# Patient Record
Sex: Female | Born: 1950 | Race: White | Hispanic: No | Marital: Married | Smoking: Never smoker
Health system: Southern US, Community
[De-identification: ages and names within clinical notes are randomized; demographics above are authoritative.]

## PROBLEM LIST (undated history)

## (undated) DIAGNOSIS — G4700 Insomnia, unspecified: Secondary | ICD-10-CM

## (undated) DIAGNOSIS — Z9289 Personal history of other medical treatment: Secondary | ICD-10-CM

## (undated) DIAGNOSIS — G039 Meningitis, unspecified: Secondary | ICD-10-CM

## (undated) DIAGNOSIS — M810 Age-related osteoporosis without current pathological fracture: Secondary | ICD-10-CM

## (undated) DIAGNOSIS — E785 Hyperlipidemia, unspecified: Secondary | ICD-10-CM

## (undated) DIAGNOSIS — IMO0001 Reserved for inherently not codable concepts without codable children: Secondary | ICD-10-CM

## (undated) DIAGNOSIS — S4990XA Unspecified injury of shoulder and upper arm, unspecified arm, initial encounter: Secondary | ICD-10-CM

## (undated) DIAGNOSIS — G4730 Sleep apnea, unspecified: Secondary | ICD-10-CM

## (undated) DIAGNOSIS — F32A Depression, unspecified: Secondary | ICD-10-CM

## (undated) DIAGNOSIS — I2510 Atherosclerotic heart disease of native coronary artery without angina pectoris: Secondary | ICD-10-CM

## (undated) DIAGNOSIS — J479 Bronchiectasis, uncomplicated: Secondary | ICD-10-CM

## (undated) DIAGNOSIS — N200 Calculus of kidney: Secondary | ICD-10-CM

## (undated) DIAGNOSIS — H359 Unspecified retinal disorder: Secondary | ICD-10-CM

## (undated) DIAGNOSIS — F329 Major depressive disorder, single episode, unspecified: Secondary | ICD-10-CM

## (undated) DIAGNOSIS — H269 Unspecified cataract: Secondary | ICD-10-CM

## (undated) DIAGNOSIS — R053 Chronic cough: Secondary | ICD-10-CM

## (undated) DIAGNOSIS — M1990 Unspecified osteoarthritis, unspecified site: Secondary | ICD-10-CM

## (undated) DIAGNOSIS — IMO0002 Reserved for concepts with insufficient information to code with codable children: Secondary | ICD-10-CM

## (undated) DIAGNOSIS — R05 Cough: Secondary | ICD-10-CM

## (undated) DIAGNOSIS — I209 Angina pectoris, unspecified: Secondary | ICD-10-CM

## (undated) DIAGNOSIS — K635 Polyp of colon: Secondary | ICD-10-CM

## (undated) DIAGNOSIS — K449 Diaphragmatic hernia without obstruction or gangrene: Secondary | ICD-10-CM

## (undated) DIAGNOSIS — I10 Essential (primary) hypertension: Secondary | ICD-10-CM

## (undated) DIAGNOSIS — K219 Gastro-esophageal reflux disease without esophagitis: Secondary | ICD-10-CM

## (undated) DIAGNOSIS — N189 Chronic kidney disease, unspecified: Secondary | ICD-10-CM

## (undated) DIAGNOSIS — K589 Irritable bowel syndrome without diarrhea: Secondary | ICD-10-CM

## (undated) DIAGNOSIS — R1310 Dysphagia, unspecified: Secondary | ICD-10-CM

## (undated) DIAGNOSIS — K2970 Gastritis, unspecified, without bleeding: Secondary | ICD-10-CM

## (undated) DIAGNOSIS — T7840XA Allergy, unspecified, initial encounter: Secondary | ICD-10-CM

## (undated) DIAGNOSIS — F419 Anxiety disorder, unspecified: Secondary | ICD-10-CM

## (undated) DIAGNOSIS — I499 Cardiac arrhythmia, unspecified: Secondary | ICD-10-CM

## (undated) DIAGNOSIS — Q760 Spina bifida occulta: Secondary | ICD-10-CM

## (undated) DIAGNOSIS — C801 Malignant (primary) neoplasm, unspecified: Secondary | ICD-10-CM

## (undated) DIAGNOSIS — D649 Anemia, unspecified: Secondary | ICD-10-CM

## (undated) DIAGNOSIS — J189 Pneumonia, unspecified organism: Secondary | ICD-10-CM

## (undated) HISTORY — DX: Reserved for concepts with insufficient information to code with codable children: IMO0002

## (undated) HISTORY — DX: Insomnia, unspecified: G47.00

## (undated) HISTORY — DX: Gastro-esophageal reflux disease without esophagitis: K21.9

## (undated) HISTORY — DX: Anemia, unspecified: D64.9

## (undated) HISTORY — DX: Depression, unspecified: F32.A

## (undated) HISTORY — DX: Chronic kidney disease, unspecified: N18.9

## (undated) HISTORY — DX: Hyperlipidemia, unspecified: E78.5

## (undated) HISTORY — DX: Age-related osteoporosis without current pathological fracture: M81.0

## (undated) HISTORY — PX: ABDOMINAL HYSTERECTOMY: SHX81

## (undated) HISTORY — DX: Polyp of colon: K63.5

## (undated) HISTORY — DX: Gastritis, unspecified, without bleeding: K29.70

## (undated) HISTORY — DX: Allergy, unspecified, initial encounter: T78.40XA

## (undated) HISTORY — DX: Sleep apnea, unspecified: G47.30

## (undated) HISTORY — DX: Personal history of other medical treatment: Z92.89

## (undated) HISTORY — DX: Spina bifida occulta: Q76.0

## (undated) HISTORY — DX: Essential (primary) hypertension: I10

## (undated) HISTORY — DX: Unspecified cataract: H26.9

## (undated) HISTORY — PX: OTHER SURGICAL HISTORY: SHX169

## (undated) HISTORY — DX: Diaphragmatic hernia without obstruction or gangrene: K44.9

## (undated) HISTORY — DX: Irritable bowel syndrome, unspecified: K58.9

## (undated) HISTORY — DX: Atherosclerotic heart disease of native coronary artery without angina pectoris: I25.10

## (undated) HISTORY — PX: COLONOSCOPY: SHX174

## (undated) HISTORY — PX: KNEE ARTHROSCOPY: SHX127

## (undated) HISTORY — PX: BACK SURGERY: SHX140

## (undated) HISTORY — PX: TONSILLECTOMY: SUR1361

## (undated) HISTORY — DX: Unspecified injury of shoulder and upper arm, unspecified arm, initial encounter: S49.90XA

## (undated) HISTORY — PX: MENISCUS REPAIR: SHX5179

## (undated) HISTORY — PX: LITHOTRIPSY: SUR834

## (undated) HISTORY — PX: ELBOW SURGERY: SHX618

## (undated) HISTORY — PX: POLYPECTOMY: SHX149

## (undated) HISTORY — DX: Unspecified osteoarthritis, unspecified site: M19.90

## (undated) HISTORY — DX: Major depressive disorder, single episode, unspecified: F32.9

---

## 1998-02-13 ENCOUNTER — Ambulatory Visit (HOSPITAL_COMMUNITY): Admission: RE | Admit: 1998-02-13 | Discharge: 1998-02-13 | Payer: Self-pay | Admitting: Internal Medicine

## 1998-06-22 ENCOUNTER — Encounter: Payer: Self-pay | Admitting: Urology

## 1998-06-22 ENCOUNTER — Ambulatory Visit (HOSPITAL_COMMUNITY): Admission: RE | Admit: 1998-06-22 | Discharge: 1998-06-22 | Payer: Self-pay | Admitting: Urology

## 1998-06-23 ENCOUNTER — Emergency Department (HOSPITAL_COMMUNITY): Admission: EM | Admit: 1998-06-23 | Discharge: 1998-06-23 | Payer: Self-pay | Admitting: Emergency Medicine

## 1998-06-24 ENCOUNTER — Emergency Department (HOSPITAL_COMMUNITY): Admission: EM | Admit: 1998-06-24 | Discharge: 1998-06-24 | Payer: Self-pay | Admitting: Emergency Medicine

## 1999-08-31 ENCOUNTER — Encounter: Payer: Self-pay | Admitting: Internal Medicine

## 1999-08-31 ENCOUNTER — Ambulatory Visit (HOSPITAL_COMMUNITY): Admission: RE | Admit: 1999-08-31 | Discharge: 1999-08-31 | Payer: Self-pay | Admitting: Internal Medicine

## 1999-09-11 ENCOUNTER — Ambulatory Visit (HOSPITAL_COMMUNITY): Admission: RE | Admit: 1999-09-11 | Discharge: 1999-09-11 | Payer: Self-pay | Admitting: Internal Medicine

## 1999-09-11 ENCOUNTER — Encounter: Payer: Self-pay | Admitting: Internal Medicine

## 1999-10-08 ENCOUNTER — Ambulatory Visit (HOSPITAL_COMMUNITY): Admission: RE | Admit: 1999-10-08 | Discharge: 1999-10-08 | Payer: Self-pay | Admitting: Internal Medicine

## 1999-11-06 ENCOUNTER — Ambulatory Visit (HOSPITAL_COMMUNITY): Admission: RE | Admit: 1999-11-06 | Discharge: 1999-11-06 | Payer: Self-pay | Admitting: Internal Medicine

## 1999-11-26 ENCOUNTER — Observation Stay (HOSPITAL_COMMUNITY): Admission: RE | Admit: 1999-11-26 | Discharge: 1999-11-28 | Payer: Self-pay | Admitting: *Deleted

## 2000-04-11 ENCOUNTER — Encounter: Admission: RE | Admit: 2000-04-11 | Discharge: 2000-04-11 | Payer: Self-pay | Admitting: Internal Medicine

## 2000-07-30 ENCOUNTER — Other Ambulatory Visit: Admission: RE | Admit: 2000-07-30 | Discharge: 2000-07-30 | Payer: Self-pay | Admitting: Internal Medicine

## 2000-09-12 ENCOUNTER — Encounter: Payer: Self-pay | Admitting: Internal Medicine

## 2000-09-12 ENCOUNTER — Ambulatory Visit (HOSPITAL_COMMUNITY): Admission: RE | Admit: 2000-09-12 | Discharge: 2000-09-12 | Payer: Self-pay | Admitting: Internal Medicine

## 2001-03-27 ENCOUNTER — Emergency Department (HOSPITAL_COMMUNITY): Admission: EM | Admit: 2001-03-27 | Discharge: 2001-03-27 | Payer: Self-pay | Admitting: Emergency Medicine

## 2001-03-27 ENCOUNTER — Encounter: Payer: Self-pay | Admitting: Emergency Medicine

## 2001-04-02 ENCOUNTER — Encounter: Payer: Self-pay | Admitting: Emergency Medicine

## 2001-04-02 ENCOUNTER — Emergency Department (HOSPITAL_COMMUNITY): Admission: EM | Admit: 2001-04-02 | Discharge: 2001-04-02 | Payer: Self-pay | Admitting: Emergency Medicine

## 2001-10-27 ENCOUNTER — Encounter: Payer: Self-pay | Admitting: Internal Medicine

## 2001-10-27 ENCOUNTER — Ambulatory Visit (HOSPITAL_COMMUNITY): Admission: RE | Admit: 2001-10-27 | Discharge: 2001-10-27 | Payer: Self-pay | Admitting: Internal Medicine

## 2001-11-05 ENCOUNTER — Encounter: Admission: RE | Admit: 2001-11-05 | Discharge: 2001-11-05 | Payer: Self-pay | Admitting: Internal Medicine

## 2001-11-05 ENCOUNTER — Encounter: Payer: Self-pay | Admitting: Internal Medicine

## 2001-11-06 ENCOUNTER — Encounter: Payer: Self-pay | Admitting: Emergency Medicine

## 2001-11-06 ENCOUNTER — Emergency Department (HOSPITAL_COMMUNITY): Admission: EM | Admit: 2001-11-06 | Discharge: 2001-11-07 | Payer: Self-pay | Admitting: Emergency Medicine

## 2001-11-10 ENCOUNTER — Encounter: Payer: Self-pay | Admitting: Gastroenterology

## 2001-11-10 ENCOUNTER — Ambulatory Visit (HOSPITAL_COMMUNITY): Admission: RE | Admit: 2001-11-10 | Discharge: 2001-11-10 | Payer: Self-pay | Admitting: Gastroenterology

## 2001-11-17 ENCOUNTER — Encounter: Payer: Self-pay | Admitting: Gastroenterology

## 2001-11-17 ENCOUNTER — Ambulatory Visit (HOSPITAL_COMMUNITY): Admission: RE | Admit: 2001-11-17 | Discharge: 2001-11-17 | Payer: Self-pay | Admitting: Gastroenterology

## 2001-11-27 ENCOUNTER — Ambulatory Visit (HOSPITAL_COMMUNITY): Admission: RE | Admit: 2001-11-27 | Discharge: 2001-11-27 | Payer: Self-pay | Admitting: Internal Medicine

## 2001-11-27 ENCOUNTER — Encounter: Payer: Self-pay | Admitting: Internal Medicine

## 2002-10-29 ENCOUNTER — Encounter: Payer: Self-pay | Admitting: Internal Medicine

## 2002-10-29 ENCOUNTER — Ambulatory Visit (HOSPITAL_COMMUNITY): Admission: RE | Admit: 2002-10-29 | Discharge: 2002-10-29 | Payer: Self-pay | Admitting: Internal Medicine

## 2003-11-07 ENCOUNTER — Ambulatory Visit (HOSPITAL_COMMUNITY): Admission: RE | Admit: 2003-11-07 | Discharge: 2003-11-07 | Payer: Self-pay | Admitting: Internal Medicine

## 2004-07-22 ENCOUNTER — Emergency Department (HOSPITAL_COMMUNITY): Admission: EM | Admit: 2004-07-22 | Discharge: 2004-07-23 | Payer: Self-pay | Admitting: Emergency Medicine

## 2004-08-31 ENCOUNTER — Ambulatory Visit: Payer: Self-pay | Admitting: Internal Medicine

## 2004-09-11 ENCOUNTER — Ambulatory Visit: Payer: Self-pay | Admitting: Internal Medicine

## 2004-10-08 ENCOUNTER — Ambulatory Visit (HOSPITAL_COMMUNITY): Admission: RE | Admit: 2004-10-08 | Discharge: 2004-10-08 | Payer: Self-pay | Admitting: *Deleted

## 2004-10-23 ENCOUNTER — Ambulatory Visit (HOSPITAL_COMMUNITY): Admission: RE | Admit: 2004-10-23 | Discharge: 2004-10-23 | Payer: Self-pay | Admitting: Internal Medicine

## 2004-11-13 ENCOUNTER — Ambulatory Visit (HOSPITAL_COMMUNITY): Admission: RE | Admit: 2004-11-13 | Discharge: 2004-11-13 | Payer: Self-pay | Admitting: Internal Medicine

## 2004-11-19 ENCOUNTER — Ambulatory Visit: Payer: Self-pay | Admitting: Internal Medicine

## 2004-11-22 ENCOUNTER — Encounter: Admission: RE | Admit: 2004-11-22 | Discharge: 2004-11-22 | Payer: Self-pay | Admitting: *Deleted

## 2005-11-14 ENCOUNTER — Encounter: Admission: RE | Admit: 2005-11-14 | Discharge: 2005-11-14 | Payer: Self-pay | Admitting: Internal Medicine

## 2006-02-03 ENCOUNTER — Encounter: Admission: RE | Admit: 2006-02-03 | Discharge: 2006-02-03 | Payer: Self-pay | Admitting: Internal Medicine

## 2006-12-01 ENCOUNTER — Encounter: Admission: RE | Admit: 2006-12-01 | Discharge: 2006-12-01 | Payer: Self-pay | Admitting: Internal Medicine

## 2006-12-10 ENCOUNTER — Encounter: Admission: RE | Admit: 2006-12-10 | Discharge: 2006-12-10 | Payer: Self-pay | Admitting: Internal Medicine

## 2007-09-19 ENCOUNTER — Encounter: Admission: RE | Admit: 2007-09-19 | Discharge: 2007-09-19 | Payer: Self-pay | Admitting: Neurological Surgery

## 2007-09-22 ENCOUNTER — Ambulatory Visit: Payer: Self-pay | Admitting: Internal Medicine

## 2007-10-27 ENCOUNTER — Encounter: Payer: Self-pay | Admitting: Internal Medicine

## 2007-10-27 ENCOUNTER — Ambulatory Visit: Payer: Self-pay | Admitting: Internal Medicine

## 2007-12-29 ENCOUNTER — Encounter: Admission: RE | Admit: 2007-12-29 | Discharge: 2007-12-29 | Payer: Self-pay | Admitting: Internal Medicine

## 2008-02-03 ENCOUNTER — Ambulatory Visit (HOSPITAL_COMMUNITY): Admission: RE | Admit: 2008-02-03 | Discharge: 2008-02-03 | Payer: Self-pay | Admitting: Allergy and Immunology

## 2008-02-05 ENCOUNTER — Telehealth: Payer: Self-pay | Admitting: Internal Medicine

## 2008-02-05 DIAGNOSIS — R1314 Dysphagia, pharyngoesophageal phase: Secondary | ICD-10-CM | POA: Insufficient documentation

## 2008-02-09 ENCOUNTER — Ambulatory Visit (HOSPITAL_COMMUNITY): Admission: RE | Admit: 2008-02-09 | Discharge: 2008-02-09 | Payer: Self-pay | Admitting: Internal Medicine

## 2008-02-18 ENCOUNTER — Telehealth (INDEPENDENT_AMBULATORY_CARE_PROVIDER_SITE_OTHER): Payer: Self-pay | Admitting: *Deleted

## 2008-02-18 ENCOUNTER — Telehealth: Payer: Self-pay | Admitting: Internal Medicine

## 2008-02-25 ENCOUNTER — Encounter: Payer: Self-pay | Admitting: Internal Medicine

## 2008-03-04 DIAGNOSIS — K219 Gastro-esophageal reflux disease without esophagitis: Secondary | ICD-10-CM | POA: Insufficient documentation

## 2008-03-04 DIAGNOSIS — K222 Esophageal obstruction: Secondary | ICD-10-CM | POA: Insufficient documentation

## 2008-03-04 DIAGNOSIS — K589 Irritable bowel syndrome without diarrhea: Secondary | ICD-10-CM | POA: Insufficient documentation

## 2008-03-04 DIAGNOSIS — D126 Benign neoplasm of colon, unspecified: Secondary | ICD-10-CM | POA: Insufficient documentation

## 2008-03-07 ENCOUNTER — Ambulatory Visit: Payer: Self-pay | Admitting: Internal Medicine

## 2008-03-07 DIAGNOSIS — Z860100 Personal history of colon polyps, unspecified: Secondary | ICD-10-CM | POA: Insufficient documentation

## 2008-03-07 DIAGNOSIS — R1319 Other dysphagia: Secondary | ICD-10-CM | POA: Insufficient documentation

## 2008-03-08 ENCOUNTER — Encounter: Payer: Self-pay | Admitting: Internal Medicine

## 2008-03-25 ENCOUNTER — Ambulatory Visit: Payer: Self-pay | Admitting: Internal Medicine

## 2008-03-25 DIAGNOSIS — R1013 Epigastric pain: Secondary | ICD-10-CM | POA: Insufficient documentation

## 2008-03-25 DIAGNOSIS — R079 Chest pain, unspecified: Secondary | ICD-10-CM | POA: Insufficient documentation

## 2008-03-28 ENCOUNTER — Telehealth (INDEPENDENT_AMBULATORY_CARE_PROVIDER_SITE_OTHER): Payer: Self-pay | Admitting: *Deleted

## 2008-03-30 ENCOUNTER — Encounter: Payer: Self-pay | Admitting: Internal Medicine

## 2008-03-30 ENCOUNTER — Telehealth (INDEPENDENT_AMBULATORY_CARE_PROVIDER_SITE_OTHER): Payer: Self-pay | Admitting: *Deleted

## 2008-03-31 ENCOUNTER — Ambulatory Visit (HOSPITAL_COMMUNITY): Admission: RE | Admit: 2008-03-31 | Discharge: 2008-03-31 | Payer: Self-pay | Admitting: Internal Medicine

## 2008-03-31 ENCOUNTER — Telehealth: Payer: Self-pay | Admitting: Internal Medicine

## 2008-04-04 ENCOUNTER — Telehealth: Payer: Self-pay | Admitting: Internal Medicine

## 2008-04-05 ENCOUNTER — Telehealth (INDEPENDENT_AMBULATORY_CARE_PROVIDER_SITE_OTHER): Payer: Self-pay | Admitting: *Deleted

## 2008-04-07 ENCOUNTER — Telehealth (INDEPENDENT_AMBULATORY_CARE_PROVIDER_SITE_OTHER): Payer: Self-pay | Admitting: *Deleted

## 2008-04-07 ENCOUNTER — Ambulatory Visit (HOSPITAL_COMMUNITY): Admission: RE | Admit: 2008-04-07 | Discharge: 2008-04-07 | Payer: Self-pay | Admitting: Internal Medicine

## 2008-04-07 ENCOUNTER — Ambulatory Visit: Payer: Self-pay | Admitting: Internal Medicine

## 2008-04-11 ENCOUNTER — Telehealth: Payer: Self-pay | Admitting: Internal Medicine

## 2008-04-25 ENCOUNTER — Ambulatory Visit (HOSPITAL_COMMUNITY): Admission: RE | Admit: 2008-04-25 | Discharge: 2008-04-25 | Payer: Self-pay | Admitting: Internal Medicine

## 2008-04-26 ENCOUNTER — Ambulatory Visit: Payer: Self-pay | Admitting: Internal Medicine

## 2008-04-27 ENCOUNTER — Telehealth: Payer: Self-pay | Admitting: Internal Medicine

## 2008-09-21 ENCOUNTER — Encounter: Admission: RE | Admit: 2008-09-21 | Discharge: 2008-11-18 | Payer: Self-pay | Admitting: Orthopaedic Surgery

## 2009-01-12 ENCOUNTER — Encounter: Admission: RE | Admit: 2009-01-12 | Discharge: 2009-01-12 | Payer: Self-pay | Admitting: Internal Medicine

## 2009-05-10 ENCOUNTER — Ambulatory Visit (HOSPITAL_COMMUNITY): Admission: RE | Admit: 2009-05-10 | Discharge: 2009-05-10 | Payer: Self-pay | Admitting: Internal Medicine

## 2009-07-11 ENCOUNTER — Inpatient Hospital Stay (HOSPITAL_COMMUNITY): Admission: AD | Admit: 2009-07-11 | Discharge: 2009-07-12 | Payer: Self-pay | Admitting: Orthopaedic Surgery

## 2010-02-06 ENCOUNTER — Encounter: Admission: RE | Admit: 2010-02-06 | Discharge: 2010-02-06 | Payer: Self-pay | Admitting: Internal Medicine

## 2010-09-16 ENCOUNTER — Encounter: Payer: Self-pay | Admitting: Allergy and Immunology

## 2010-09-16 ENCOUNTER — Encounter: Payer: Self-pay | Admitting: Internal Medicine

## 2010-09-25 NOTE — Progress Notes (Signed)
Summary: LIBRAX TOO EXPENSIVE  Medications Added BENTYL 20 MG  TABS (DICYCLOMINE HCL) take 1 p.o. a.c. as needed       Phone Note Call from Patient Call back at Home Phone [phone]   Call For: DR Nashonda Limberg Summary of Call:  WAS HERE YESTERDAY GIVEN LIBRAX WAS $156 AND SHE JUST CAN NOT AFFORD. Initial call taken by: Leanor Kail [facility],  April 27, 2008 12:05 PM  Follow-up for Phone Call        Even generic??? If so, try bentyl 20mg  ac as needed.Hilarie Fredrickson MD  April 27, 2008 1:06 PM Pt contacted her pharmacy and price of 156.00 was for one month supply of librax.Pharmacistsays bentyl will be cheaper,order sent to pharmacy.  Teryl Lucy RN  April 27, 2008 3:23 PM      New/Updated Medications: BENTYL 20 MG  TABS (DICYCLOMINE HCL) take 1 p.o. a.c. as needed   Prescriptions: BENTYL 20 MG  TABS (DICYCLOMINE HCL) take 1 p.o. a.c. as needed  #90 x 6   Entered by:   Teryl Lucy RN   Authorized by:   Hilarie Fredrickson MD   Signed by:   Teryl Lucy RN on 04/27/2008   Method used:   Electronically to        [facility]. [phone] (retail)       [address]       [place], [place]  [address]       Ph: [phone]       Fax: [phone]   RxID:   [id number]

## 2010-10-19 ENCOUNTER — Ambulatory Visit (HOSPITAL_COMMUNITY): Payer: Medicare Other | Attending: Internal Medicine

## 2010-10-19 DIAGNOSIS — M810 Age-related osteoporosis without current pathological fracture: Secondary | ICD-10-CM | POA: Insufficient documentation

## 2010-10-22 ENCOUNTER — Observation Stay (HOSPITAL_COMMUNITY): Payer: Medicare Other

## 2010-10-22 ENCOUNTER — Emergency Department (HOSPITAL_COMMUNITY): Payer: Medicare Other

## 2010-10-22 ENCOUNTER — Observation Stay (HOSPITAL_COMMUNITY)
Admission: EM | Admit: 2010-10-22 | Discharge: 2010-10-23 | Disposition: A | Discharge status: Home / Self Care | Payer: Medicare Other | Attending: Cardiovascular Disease | Admitting: Cardiovascular Disease

## 2010-10-22 DIAGNOSIS — J329 Chronic sinusitis, unspecified: Secondary | ICD-10-CM | POA: Insufficient documentation

## 2010-10-22 DIAGNOSIS — E119 Type 2 diabetes mellitus without complications: Secondary | ICD-10-CM | POA: Insufficient documentation

## 2010-10-22 DIAGNOSIS — Z860100 Personal history of colon polyps, unspecified: Secondary | ICD-10-CM | POA: Insufficient documentation

## 2010-10-22 DIAGNOSIS — I10 Essential (primary) hypertension: Secondary | ICD-10-CM | POA: Insufficient documentation

## 2010-10-22 DIAGNOSIS — R079 Chest pain, unspecified: Principal | ICD-10-CM | POA: Insufficient documentation

## 2010-10-22 DIAGNOSIS — K589 Irritable bowel syndrome without diarrhea: Secondary | ICD-10-CM | POA: Insufficient documentation

## 2010-10-22 DIAGNOSIS — R61 Generalized hyperhidrosis: Secondary | ICD-10-CM | POA: Insufficient documentation

## 2010-10-22 DIAGNOSIS — Z7902 Long term (current) use of antithrombotics/antiplatelets: Secondary | ICD-10-CM | POA: Insufficient documentation

## 2010-10-22 DIAGNOSIS — E785 Hyperlipidemia, unspecified: Secondary | ICD-10-CM | POA: Insufficient documentation

## 2010-10-22 DIAGNOSIS — R110 Nausea: Secondary | ICD-10-CM | POA: Insufficient documentation

## 2010-10-22 DIAGNOSIS — H579 Unspecified disorder of eye and adnexa: Secondary | ICD-10-CM | POA: Insufficient documentation

## 2010-10-22 DIAGNOSIS — M810 Age-related osteoporosis without current pathological fracture: Secondary | ICD-10-CM | POA: Insufficient documentation

## 2010-10-22 DIAGNOSIS — R0602 Shortness of breath: Secondary | ICD-10-CM | POA: Insufficient documentation

## 2010-10-22 DIAGNOSIS — Z79899 Other long term (current) drug therapy: Secondary | ICD-10-CM | POA: Insufficient documentation

## 2010-10-22 LAB — BASIC METABOLIC PANEL
GFR calc non Af Amer: 46 mL/min — ABNORMAL LOW (ref >60)
Glucose, Bld: 235 mg/dL — ABNORMAL HIGH (ref 70–99)
Potassium: 3.6 mEq/L (ref 3.5–5.1)
Sodium: 136 mEq/L (ref 135–145)

## 2010-10-22 LAB — CBC
HCT: 35.6 % — ABNORMAL LOW (ref 36.0–46.0)
Hemoglobin: 12.1 g/dL (ref 12.0–15.0)
MCHC: 34 g/dL (ref 30.0–36.0)
MCV: 90.8 fL (ref 78.0–100.0)
Platelets: 152 10*3/uL (ref 150–400)
RBC: 3.92 MIL/uL (ref 3.87–5.11)
WBC: 10.8 10*3/uL — ABNORMAL HIGH (ref 4.0–10.5)

## 2010-10-22 LAB — DIFFERENTIAL
Eosinophils Relative: 1 % (ref 0–5)
Monocytes Absolute: 0.8 10*3/uL (ref 0.1–1.0)
Monocytes Relative: 7 % (ref 3–12)

## 2010-10-22 LAB — COMPREHENSIVE METABOLIC PANEL
BUN: 14 mg/dL (ref 6–23)
CO2: 24 mEq/L (ref 19–32)
Chloride: 104 mEq/L (ref 96–112)
Creatinine, Ser: 1.09 mg/dL (ref 0.4–1.2)
GFR calc non Af Amer: 52 mL/min — ABNORMAL LOW (ref >60)
Glucose, Bld: 196 mg/dL — ABNORMAL HIGH (ref 70–99)
Total Bilirubin: 0.6 mg/dL (ref 0.3–1.2)

## 2010-10-22 LAB — MAGNESIUM: Magnesium: 2.5 mg/dL (ref 1.5–2.5)

## 2010-10-22 LAB — URINALYSIS, ROUTINE W REFLEX MICROSCOPIC
Protein, ur: 30 mg/dL — AB
Specific Gravity, Urine: 1.012 (ref 1.005–1.030)
Urobilinogen, UA: 0.2 mg/dL (ref 0.0–1.0)
pH: 5.5 (ref 5.0–8.0)

## 2010-10-22 LAB — GLUCOSE, CAPILLARY: Glucose-Capillary: 134 mg/dL — ABNORMAL HIGH (ref 70–99)

## 2010-10-22 LAB — TROPONIN I: Troponin I: <0.01 ng/mL (ref 0.00–0.06)

## 2010-10-22 LAB — CK TOTAL AND CKMB (NOT AT ARMC)
Relative Index: 1.3 (ref 0.0–2.5)
Total CK: 130 U/L (ref 7–177)

## 2010-10-22 LAB — POCT CARDIAC MARKERS

## 2010-10-22 LAB — TSH: TSH: 2.945 u[IU]/mL (ref 0.350–4.500)

## 2010-10-22 LAB — URINE MICROSCOPIC-ADD ON

## 2010-10-22 LAB — APTT: aPTT: 28 seconds (ref 24–37)

## 2010-10-23 ENCOUNTER — Observation Stay (HOSPITAL_COMMUNITY): Payer: Medicare Other

## 2010-10-23 LAB — CBC
HCT: 35.7 % — ABNORMAL LOW (ref 36.0–46.0)
MCV: 92.5 fL (ref 78.0–100.0)
RBC: 3.86 MIL/uL — ABNORMAL LOW (ref 3.87–5.11)
WBC: 7.4 10*3/uL (ref 4.0–10.5)

## 2010-10-23 LAB — GLUCOSE, CAPILLARY: Glucose-Capillary: 303 mg/dL — ABNORMAL HIGH (ref 70–99)

## 2010-10-23 LAB — CARDIAC PANEL(CRET KIN+CKTOT+MB+TROPI)
CK, MB: 1.2 ng/mL (ref 0.3–4.0)
Relative Index: INVALID (ref 0.0–2.5)
Total CK: 58 U/L (ref 7–177)

## 2010-10-23 LAB — LIPID PANEL
Cholesterol: 226 mg/dL — ABNORMAL HIGH (ref 0–200)
HDL: 44 mg/dL (ref >39)
Triglycerides: 381 mg/dL — ABNORMAL HIGH (ref <150)

## 2010-10-23 LAB — HEPARIN LEVEL (UNFRACTIONATED): Heparin Unfractionated: <0.1 IU/mL — ABNORMAL LOW (ref 0.30–0.70)

## 2010-10-23 MED ORDER — TECHNETIUM TC 99M TETROFOSMIN IV KIT
10.0000 | PACK | Freq: Once | INTRAVENOUS | Status: AC | PRN
  Administered 2010-10-23: 10 via INTRAVENOUS

## 2010-10-23 MED ORDER — TECHNETIUM TC 99M TETROFOSMIN IV KIT
30.0000 | PACK | Freq: Once | INTRAVENOUS | Status: AC | PRN
  Administered 2010-10-23: 30 via INTRAVENOUS

## 2010-10-24 ENCOUNTER — Observation Stay (HOSPITAL_COMMUNITY): Payer: Medicare Other

## 2010-10-26 NOTE — H&P (Signed)
NAME:  Tiffany [name], CUMBER NO.:  [id number]  MEDICAL RECORD NO.:  [id number]           PATIENT TYPE:  E  LOCATION:  [facility]                         FACILITY:  [facility]  PHYSICIAN:  Thurmon Fair, MD     DATE OF BIRTH:  09/16/1950  DATE OF ADMISSION:  10/22/2010 DATE OF DISCHARGE:                             HISTORY & PHYSICAL   CHIEF COMPLAINT:  Chest pain.  HISTORY OF PRESENT ILLNESS:  Ms. Tiffany [name] is a 60 year old female followed by Dr. Felipa Eth.  She has multiple medical problems.  She had a remote Cardiolite by Dr. Patty Sermons, which she says was normal.  She has never had a catheterization.  She has not previously been troubled by chest pain as far as she knows.  Last week, she received a injection of Reclast for osteoporosis.  This weekend, she developed a reaction in her right eye, which is fairly severe.  Her right eye is closed and inflamed.  She went to see Dr. Burgess Estelle her ophthalmologist.  She also says Saturday that she developed some chest pain.  She says it started in her left shoulder then went to her mid chest.  She said it was so bad and at one point, she thought she was going to pass out.  She did have some breathlessness and some diaphoresis.  She has been nauseated off and on, on the weekend and contributed this to the injection reaction. Her symptoms abated on Saturday, but it came back today at low level. She describes a band-like discomfort around her chest.  It seems to be worse with activity.  She mentioned this to the ophthalmologist and he suggested she come to the emergency room.  Her EKG shows no acute changes and her initial enzymes were negative.  She is admitted now for further evaluation.  PAST MEDICAL HISTORY:  Remarkable for, 1. Type 2 non-insulin-dependent diabetes, diet-controlled. 2. She has had irritable bowel syndrome and gastritis and esophagitis.     She has been seen by [facility] in the past. 3. She has had previous Nissen  fundoplication as well as esophageal     dilatation. 4. She has a history of spina bifida and had back surgery in January     2006. 5. She has a history of adenomatous polyps and has had a polypectomy     in the past. 6. She had cellulitis in her left arm, November 2010, and was admitted     and had I and D of this. 7. She has osteoporosis as noted. 8. She has treated dyslipidemia, she takes Zocor 80 at home. 9. She has chronic sinusitis.  HOME MEDICATIONS: 1. Claritin. 2. Flonase. 3. Flexeril. 4. Zocor 80 mg.  ALLERGIES:  SHE HAS DRUG INTOLERANCES INCLUDING ACE INHIBITORS AND ARB, WHICH HAVE CAUSED ANGIOEDEMA IN THE PAST.  SHE IS INTOLERANT TO NONSTEROIDALS AND ASPIRIN, WHICH CAUSED SOME GI IRRITATION IN THE PAST. SHE IS INTOLERANT TO ERYTHROMYCIN, WHICH ALSO CAUSES GI PROBLEMS AS WELL AS CODEINE.  SOCIAL HISTORY:  She is married.  She is a nonsmoker.  FAMILY HISTORY:  Unremarkable for coronary disease, her brother had a  catheterization, which was normal.  REVIEW OF SYSTEMS:  Essentially unremarkable except for noted above.  PHYSICAL EXAM:  VITAL SIGNS:  Blood pressure 129/86, pulse 92, temperature 98.7. GENERAL:  She is a well-developed overweight female, in no acute distress. HEENT:  Her right eye is injected and closed, she says she cannot open her right eye on her own.  There is periorbital edema. NECK:  Without JVD or bruit. CHEST:  Clear to auscultation and percussion. CARDIAC:  Regular rate and rhythm without obvious murmur, rub, or gallop.  Normal S1 and S2. ABDOMEN:  Obese, nontender.  Bowel sounds are present. EXTREMITIES:  Without edema.  Distal pulses are 2+/4 bilaterally. NEURO EXAM:  Grossly intact.  She is awake, alert, oriented, and cooperative.  Moves all extremities without obvious deficit. SKIN:  Cool and dry.  LABORATORY DATA:  Troponins were negative x2.  Sodium 136, potassium 3.6, BUN 15, creatinine 1.21.  White count 10.8, hemoglobin  12.1, hematocrit 35.6, platelets 152.  Chest x-ray shows scarring at the bases, but no acute disease.  EKG shows sinus rhythm with nonspecific ST changes.  IMPRESSION: 1. Unstable angina. 2. Type 2 non-insulin-dependent diabetes. 3. History of hypertension, previously treated with ACE inhibitor and     ARB, but she developed angioedema, currently not on any     antihypertensives. 4. Treated dyslipidemia, on Zocor 80 mg a day. 5. Recent Reclast injection with reaction in her right eye. 6. Irritable bowel syndrome. 7. History of gastroesophageal reflux disease and esophagitis,     followed by [facility]. 8. Spina bifida status post surgery, January 2006. 9. History of adenomatous polyps.  PLAN:  The patient will be admitted to telemetry, started on heparin and nitrates and beta-blocker, and ruled out for an MI.  I did put her on the schedule for a Lexiscan Myoview tomorrow if her enzymes were negative.  The patient feels her symptoms are secondary to stress.     Abelino Derrick, P.A.   ______________________________ Thurmon Fair, MD    LKK/MEDQ  D:  10/22/2010  T:  10/22/2010  Job:  [id number]  cc:   Thurmon Fair, MD Larina Earthly, M.D.  Electronically Signed by Corine Shelter P.A. on 10/22/2010 06:03:04 PM Electronically Signed by Thurmon Fair M.D. on 10/26/2010 12:51:25 PM

## 2010-11-02 NOTE — Discharge Summary (Signed)
Tiffany [name], SLIVA NO.:  [id number]  MEDICAL RECORD NO.:  [id number]           PATIENT TYPE:  I  LOCATION:  3743                         FACILITY:  [facility]  PHYSICIAN:  [place]aly Hilty, MD         DATE OF BIRTH:  July 26, 1951  DATE OF ADMISSION:  Nov 19, 2010 DATE OF DISCHARGE:  10/23/2010                              DISCHARGE SUMMARY   DISCHARGE DIAGNOSES: 1. Chest pain: negative cardiac enzymes x3.  Negative Lexiscan     Myoview. 2. Suspected drug reaction from Reclast injection to right eye.  The     patient is being treated by [facility] and also Dr.     Jarome Matin. 3. Type 2 diabetes. 4. History of hypertension. 5. Treated dyslipidemia.  HOSPITAL COURSE:  Ms. [name] is a 59 year old Caucasian female followed by Dr. Felipa Eth.  She has multiple medical problems.  She has not been previously troubled by any chest pain that she knows of.  In the last week, she received injection of Reclast for osteoporosis.  Subsequently, developed a reaction in her right eye which is fairly severe.  Her right eye was closed and inflamed.  She went to see Dr. Burgess Estelle, her ophthalmologist.  She also stated that on Saturday, she developed some chest pain which started in her left shoulder and went towards mid chest.  It was so bad at one point she thought she was going to pass out.  She did have some breathlessness as well as diaphoresis and as well as nausea off and on throughout the weekend.  An EKG showed no acute changes and initial enzymes were negative.  She was admitted for further evaluation to cycle cardiac enzymes.  She was started on heparin and nitrates and beta-blockers to rule out for MI.  She was scheduled for [facility] for the 28.  Myoview was negative for ischemia. Cardiac enzymes were negative x3.  She is currently chest pain free. She has seen by Dr. Rennis Golden, who feels she is stable for discharge.  DISCHARGE LABORATORY DATA:  WBC 7.4,  hemoglobin 11.8, hematocrit 35.7, platelets 183, blood glucose 303.  Sodium 137, potassium 3.6, chloride 104, carbon dioxide 24, BUN 14, creatinine 1.09, total bilirubin 0.6, alk phos 186, AST 89, ALT 79, total protein 7.1, albumin 3.9, calcium 8.9, magnesium 2.5, hemoglobin A1c 8.2, total cholesterol 226, triglycerides 281, HDL 44, LDL 106.  Urinalysis showed large leukocytes and few bacteria, small blood, protein of 30 and few squamous epithelials.  STUDIES/PROCEDURES:  Chest x-ray 2010-11-19.  Impression, subsegmental atelectasis or scarring in both perihilar regions.  No acute findings.  Lexiscan Myoview stress test.  Impression, normal myocardial perfusion examination.  No evidence of ischemia or reversible defect.  DISCHARGE MEDICATIONS: 1. Amaryl  2 mg 1 tablet by mouth every morning. 2. Homatropine 5% ophthalmiac solution 1 drop in the right eye twice     daily. 3. Metoprolol tartrate 25 mg tablets one half tablet by mouth twice     daily. 4. Nitroglycerin sublingual 0.4 mg 1 tablet every 5 minutes as needed  for chest pain 3 doses total.  5. Prednisolone 1% ophthalmiac solution 1 drop in the right eye every     2 hours. 6. Prednisone 20 mg tablets 2 tablets by mouth daily with a meal. 7. Calcium carbonate over-the-counter 1-2 tablets by mouth twice     daily. 8. Furosemide 40 mg 1 tablet by mouth daily as needed for swelling. 9. Flexeril 10 mg 1 tablet by mouth daily at bedtime. 10.Flonase nasal spray 1 spray nasally daily as needed for nasal     congestion. 11.Hydrocodone/APAP 5/500 mg one tablet by mouth every 6 h. as needed     for back pain. 12.Phenergan 25 mg 1-2 tablets by mouth every 4 h. as needed for     nausea. 13.Tylenol 325 mg 1 tablet by mouth every 6 h. as needed for fever     pain. 14.Valium 10 mg 1 tablet by mouth daily at bedtime. 15.Vitamin D2 50,000 units 1 tablet by mouth weekly every Monday. 16.Zocor 80 mg 1 tablet by mouth  daily.  DISPOSITION:  Ms. Tiffany [name] will be discharged home in stable condition. It is recommended she eat a heart-healthy diet. She will follow up with Dr. Royann Shivers at [facility]. We will call her with the appointment time or send her card.  She also will follow up with Dr. Eloise Harman on Thursday.    ______________________________ Wilburt Finlay, PA   ______________________________ [place] Hilty, MD    BH/MEDQ  D:  10/23/2010  T:  10/24/2010  Job:  [id number]  cc:   Thurmon Fair, MD Lamarr Lulas, MD  Electronically Signed by Wilburt Finlay PA on 10/24/2010 04:30:43 PM Electronically Signed by Kirtland Bouchard. HILTY M.D. on 11/01/2010 08:01:27 AM

## 2010-11-28 LAB — CBC
HCT: 34.9 % — ABNORMAL LOW (ref 36.0–46.0)
Hemoglobin: 12.1 g/dL (ref 12.0–15.0)
MCHC: 34.5 g/dL (ref 30.0–36.0)
MCV: 94.2 fL (ref 78.0–100.0)
Platelets: 205 10*3/uL (ref 150–400)
RDW: 13.1 % (ref 11.5–15.5)

## 2010-11-28 LAB — BASIC METABOLIC PANEL
BUN: 8 mg/dL (ref 6–23)
CO2: 29 mEq/L (ref 19–32)
GFR calc non Af Amer: >60 mL/min (ref >60)
Glucose, Bld: 109 mg/dL — ABNORMAL HIGH (ref 70–99)
Potassium: 4 mEq/L (ref 3.5–5.1)

## 2010-11-28 LAB — DIFFERENTIAL
Basophils Absolute: 0 10*3/uL (ref 0.0–0.1)
Basophils Relative: 0 % (ref 0–1)
Eosinophils Absolute: 0.1 10*3/uL (ref 0.0–0.7)
Eosinophils Relative: 2 % (ref 0–5)
Lymphocytes Relative: 26 % (ref 12–46)
Monocytes Absolute: 0.6 10*3/uL (ref 0.1–1.0)

## 2010-11-28 LAB — PROTIME-INR: INR: 0.99 (ref 0.00–1.49)

## 2010-11-28 LAB — ANAEROBIC CULTURE

## 2010-11-28 LAB — CULTURE, ROUTINE-ABSCESS

## 2011-01-08 NOTE — Assessment & Plan Note (Signed)
HEALTHCARE                         GASTROENTEROLOGY OFFICE NOTE   Tiffany [name]                      MRN:          [id number]  DATE:09/22/2007                            DOB:          1950/12/13    HISTORY:  Tiffany [name] presents today with several different GI issues.  She has  not been seen in the office since October of 2005.  Briefly, she is a 60-  year-old female with a history of gastroesophageal reflux disease  refractory to medical therapy with subsequent Nissen fundoplication,  recurrent dysphagia, and reflux symptoms post fundoplication requiring  esophageal dilation and resumption of acid suppressive medications,  irritable bowel syndrome, family history of colon cancer, personal  history of spina bifida with occult spinal dysraphism, status post T12  through L5 laminectomies, and hyperlipidemia.  She last underwent  colonoscopy and upper endoscopy in January of 2006.  Colonoscopy was  performed for the purposes of screening given a family history of colon  cancer in a grandparent.  She was found to have multiple adenomatous  colon polyps, several of which were over 10 mm, also mild  diverticulosis.  Follow up in three years recommended.  She has had no  change in her lower abdominal complaints, no bleeding.  Upper endoscopy  was performed for complaints of dysphagia and reflux.  She was found to  have mild esophagitis as well as evidence of antireflux surgery.  She  was dilated with a 1 [place] Maloney dilator which she states markedly  improved her dysphagia.  She had been on Prevacid for her reflux.  More  recently changed to Zantac at night in addition for breakthrough  symptoms.  Subsequently went to Zantac b.i.d.  Still having significant  reflux symptoms, maybe worse on Zantac, then proton pump inhibitor.  Her  dysphagia is intermittent and to solid foods.   ALLERGIES:  SHE HAS A NUMBER OF ALLERGIES AND DRUG INTOLERANCES  INCLUDING  CODEINE, ERYTHROMYCIN, NONSTEROIDAL ANTI-INFLAMMATORY DRUGS,  IVP DYE, LISINOPRIL AND BENICAR.  ALSO FOOD ALLERGIES INCLUDING NUTS AND  SHELLFISH.   CURRENT MEDICATIONS:  1. Zocor 80 mg daily.  2. Calcium 1500 mg daily.  3. Vitamin D.  4. Valium 5 mg in the morning and 10 mg at night.  5. Flexeril p.r.n.  6. Fioricet p.r.n.  7. Cymbalta 60 mg daily.  8. Tylenol p.r.n.   PHYSICAL EXAMINATION:  GENERAL:  A comfortable-appearing female in no  acute distress.  VITAL SIGNS:  Her blood pressure is 140/90, heart rate 68, weight is  166.6 pounds.  HEENT:  Sclerae anicteric.  Conjunctivae are pink.  Oral mucosa intact.  LUNGS:  Clear.  ABDOMEN:  Soft without tenderness, masses or hernia.  There is a brace on the left lower extremity.   IMPRESSION:  1. Gastroesophageal reflux disease status post Nissen fundoplication,      currently with significant breakthrough reflux symptoms on b.i.d.      Zantac. Also, recurrent dysphagia; prior esophageal dilation was      beneficial to this patient.  2. History of multiple adenomatous colon polyps and a family history  of colon cancer.  Due for surveillance.  3. Multiple other medical problems as outlined above.   RECOMMENDATIONS:  1. Colonoscopy with polypectomy if necessary and upper endoscopy with      esophageal dilation.  The nature of both procedures as well as the      risks, benefits and alternatives were reviewed in detail.  She      understood and agreed to proceed.  2. Change from Zantac to Prilosec OTC 20 mg daily.  May be titrated      upward until desired effect obtained.  3. Ongoing general medical care with Dr. Felipa Eth.     Wilhemina Bonito. Marina Goodell, MD  Electronically Signed    JNP/MedQ  DD: 09/22/2007  DT: 09/22/2007  Job #: [id number]   cc:   Larina Earthly, M.D.

## 2011-01-11 NOTE — Op Note (Signed)
[place]  Patient:    Tiffany [name], Tiffany [name]                      MRN: [id number] Proc. Date: 10/08/99 Adm. Date:  [id number] Disc. Date: [id number] Attending:  Estella Husk CC:         [place] Daphine Deutscher, M.D.             Thad Ranger, M.D.                           Operative Report  PROCEDURE:  Esophageal manometry.  INDICATIONS:  Anticipation of antireflux surgery.  HISTORY:  This is a 59 year old with longstanding gastroesophageal reflux disease with refractory medical therapy.  She is for anticipated fundoplication with Dr. Luretha Murphy.  She is now here for esophageal manometry.  DESCRIPTION OF PROCEDURE:  The patient reported to the [facility] GI laboratory.  Esophageal manometry was performed in the standard manner.  FINDINGS: 1. The upper esophageal sphincter demonstrated normal relaxation and coordination. 2. The esophageal body demonstrated normal wave amplitude and normal wave and    normal wave propagation.  Thus, normal peristalsis. 3. Lower esophageal sphincter resting pressure was calculated to be 47.5 mmHg    (upper limit of normal - 45 mmHg).  However, this appeared to be slightly    artifactually elevated upon reviewing the grafts.  The sphincter demonstrated    normal and complete relaxation with wet swallows.  IMPRESSION:  Normal esophageal manometry.  RECOMMENDATIONS:  Laparoscopic Nissen fundoplication with Dr. Daphine Deutscher. DD:  10/10/99 TD:  10/10/99 Job: [id number]

## 2011-01-11 NOTE — Op Note (Signed)
NAME:  Tiffany [name], Tiffany [name] NO.:  [id number]   MEDICAL RECORD NO.:  [id number]          PATIENT TYPE:  AMB   LOCATION:  ENDO                         FACILITY:  [facility]   PHYSICIAN:  Wilhemina Bonito. Marina Goodell, M.D. [facility] OF BIRTH:  September 21, 1950   DATE OF PROCEDURE:  10/23/2004  DATE OF DISCHARGE:  10/23/2004                                 OPERATIVE REPORT   PROCEDURE:  Esophageal manometry.   INDICATIONS FOR PROCEDURE:  Multiple upper GI symptoms.  Question reflux.   INDICATIONS FOR PROCEDURE:  This is a 60 year old female with well-  documented gastroesophageal reflux who had undergone prior successful  laparoscopic fundoplication.  She recently has been complaining of  heartburn, chest pain, regurgitation, dysphagia, nausea, hoarseness, and  sore throat despite being placed on maximal medical therapy.  She had  undergone recent upper endoscopy with esophageal dilation on 09/11/2004.  Evidence of prior reflux surgery noted.  Empiric esophageal dilation for  dysphagia performed.  Mild erythema of the esophagus present.  She is now  for pH study.  She has been off Prevacid and Reglan for three days.   PROCEDURE:  The patient presented to the [facility] GI  laboratory.  Dual-probe pH testing was performed in the standard manner.  Data was recorded for 24 hours and 8 minutes.   FINDINGS:  The proximal probe recorded 10 reflux episodes.  The longest  episode of reflux lasted for one minute.  The distal probe recorded 12  reflux episodes.  The DeMeester score was calculated at 2.4 (normal less  than 22.0).  The number of events were recorded.  These included heartburn,  chest pain, cough, and belching.  There was no correlation between symptoms  and reflux episodes.  She did mention regurgitation on two occasions which  roughly correlated with proximal reflux.   IMPRESSION:  No overwhelming reflux on this study.   RECOMMENDATIONS:  The patient is to see Dr. Danna Hefty with whom I  have discussed these results.  I do not believe Dr. Luan Pulling is leaning  towards surgical therapy at this point with which I would concur.      JNP/MEDQ  D:  11/19/2004  T:  11/19/2004  Job:  [id number]   cc:   Vikki Ports, MD  [address]  [place]  [place] [address]

## 2011-01-18 ENCOUNTER — Other Ambulatory Visit: Payer: Self-pay | Admitting: Internal Medicine

## 2011-01-18 DIAGNOSIS — Z1231 Encounter for screening mammogram for malignant neoplasm of breast: Secondary | ICD-10-CM

## 2011-02-11 ENCOUNTER — Ambulatory Visit
Admission: RE | Admit: 2011-02-11 | Discharge: 2011-02-11 | Disposition: A | Discharge status: Home / Self Care | Payer: Medicare Other | Source: Ambulatory Visit | Attending: Internal Medicine | Admitting: Internal Medicine

## 2011-02-11 DIAGNOSIS — Z1231 Encounter for screening mammogram for malignant neoplasm of breast: Secondary | ICD-10-CM

## 2011-11-24 ENCOUNTER — Emergency Department (HOSPITAL_COMMUNITY): Payer: Medicare Other

## 2011-11-24 ENCOUNTER — Other Ambulatory Visit: Payer: Self-pay

## 2011-11-24 ENCOUNTER — Encounter (HOSPITAL_COMMUNITY): Payer: Self-pay

## 2011-11-24 ENCOUNTER — Emergency Department (HOSPITAL_COMMUNITY)
Admission: EM | Admit: 2011-11-24 | Discharge: 2011-11-24 | Disposition: A | Discharge status: Home / Self Care | Payer: Medicare Other | Attending: Emergency Medicine | Admitting: Emergency Medicine

## 2011-11-24 DIAGNOSIS — E119 Type 2 diabetes mellitus without complications: Secondary | ICD-10-CM | POA: Insufficient documentation

## 2011-11-24 DIAGNOSIS — R197 Diarrhea, unspecified: Secondary | ICD-10-CM | POA: Insufficient documentation

## 2011-11-24 DIAGNOSIS — K5289 Other specified noninfective gastroenteritis and colitis: Secondary | ICD-10-CM | POA: Insufficient documentation

## 2011-11-24 DIAGNOSIS — R1013 Epigastric pain: Secondary | ICD-10-CM | POA: Insufficient documentation

## 2011-11-24 DIAGNOSIS — K529 Noninfective gastroenteritis and colitis, unspecified: Secondary | ICD-10-CM

## 2011-11-24 HISTORY — DX: Meningitis, unspecified: G03.9

## 2011-11-24 HISTORY — DX: Calculus of kidney: N20.0

## 2011-11-24 LAB — URINALYSIS, ROUTINE W REFLEX MICROSCOPIC
Bilirubin Urine: NEGATIVE
Ketones, ur: 15 mg/dL — AB
Nitrite: NEGATIVE
Urobilinogen, UA: 0.2 mg/dL (ref 0.0–1.0)

## 2011-11-24 LAB — COMPREHENSIVE METABOLIC PANEL
ALT: 13 U/L (ref 0–35)
Alkaline Phosphatase: 73 U/L (ref 39–117)
CO2: 23 mEq/L (ref 19–32)
GFR calc Af Amer: 69 mL/min — ABNORMAL LOW (ref >90)
GFR calc non Af Amer: 60 mL/min — ABNORMAL LOW (ref >90)
Glucose, Bld: 103 mg/dL — ABNORMAL HIGH (ref 70–99)
Potassium: 3.6 mEq/L (ref 3.5–5.1)
Sodium: 141 mEq/L (ref 135–145)
Total Bilirubin: 0.5 mg/dL (ref 0.3–1.2)

## 2011-11-24 LAB — DIFFERENTIAL
Eosinophils Relative: 2 % (ref 0–5)
Lymphocytes Relative: 19 % (ref 12–46)
Lymphs Abs: 1.2 10*3/uL (ref 0.7–4.0)
Neutrophils Relative %: 72 % (ref 43–77)

## 2011-11-24 LAB — CBC
Hemoglobin: 13 g/dL (ref 12.0–15.0)
MCV: 91.2 fL (ref 78.0–100.0)
Platelets: 168 10*3/uL (ref 150–400)
RBC: 4.32 MIL/uL (ref 3.87–5.11)
WBC: 6.3 10*3/uL (ref 4.0–10.5)

## 2011-11-24 MED ORDER — SODIUM CHLORIDE 0.9 % IV BOLUS (SEPSIS)
1000.0000 mL | Freq: Once | INTRAVENOUS | Status: AC
  Administered 2011-11-24: 1000 mL via INTRAVENOUS

## 2011-11-24 MED ORDER — HYDROMORPHONE HCL PF 1 MG/ML IJ SOLN
1.0000 mg | Freq: Once | INTRAMUSCULAR | Status: AC
  Administered 2011-11-24: 1 mg via INTRAVENOUS
  Filled 2011-11-24: qty 1

## 2011-11-24 MED ORDER — LORAZEPAM 2 MG/ML IJ SOLN
1.0000 mg | Freq: Once | INTRAMUSCULAR | Status: AC
  Administered 2011-11-24: 1 mg via INTRAVENOUS
  Filled 2011-11-24: qty 1

## 2011-11-24 MED ORDER — IOHEXOL 300 MG/ML  SOLN
100.0000 mL | Freq: Once | INTRAMUSCULAR | Status: AC | PRN
  Administered 2011-11-24: 100 mL via INTRAVENOUS

## 2011-11-24 MED ORDER — ONDANSETRON HCL 4 MG/2ML IJ SOLN
4.0000 mg | Freq: Once | INTRAMUSCULAR | Status: AC
  Administered 2011-11-24: 4 mg via INTRAVENOUS
  Filled 2011-11-24: qty 2

## 2011-11-24 MED ORDER — PROMETHAZINE HCL 25 MG/ML IJ SOLN
25.0000 mg | INTRAMUSCULAR | Status: AC
  Administered 2011-11-24: 25 mg via INTRAVENOUS
  Filled 2011-11-24: qty 1

## 2011-11-24 MED ORDER — METOCLOPRAMIDE HCL 5 MG/ML IJ SOLN
10.0000 mg | Freq: Once | INTRAMUSCULAR | Status: AC
  Administered 2011-11-24: 10 mg via INTRAVENOUS
  Filled 2011-11-24: qty 2

## 2011-11-24 MED ORDER — SODIUM CHLORIDE 0.9 % IV SOLN
Freq: Once | INTRAVENOUS | Status: AC
  Administered 2011-11-24: 16:00:00 via INTRAVENOUS

## 2011-11-24 MED ORDER — PANTOPRAZOLE SODIUM 40 MG IV SOLR
40.0000 mg | Freq: Once | INTRAVENOUS | Status: AC
  Administered 2011-11-24: 40 mg via INTRAVENOUS
  Filled 2011-11-24: qty 40

## 2011-11-24 MED ORDER — IOHEXOL 300 MG/ML  SOLN
20.0000 mL | INTRAMUSCULAR | Status: AC
  Administered 2011-11-24: 20 mL via ORAL

## 2011-11-24 NOTE — ED Notes (Signed)
Pt reports decreased nausea, pt is sipping on contrast at this time, PA made aware. Report given to [facility] in CDU, pt is aware of poc.

## 2011-11-24 NOTE — ED Notes (Signed)
Pt. Having diarrhea for 4 days with nausea, weakness and abdominal cramping

## 2011-11-24 NOTE — ED Provider Notes (Signed)
10:03 AM  Date: 11/24/2011  Rate: 75  Rhythm: normal sinus rhythm  QRS Axis: normal  Intervals: normal  ST/T Wave abnormalities: normal  Conduction Disutrbances:none  Narrative Interpretation: Normal EKG  Old EKG Reviewed: none available    Carleene Cooper III, MD 11/24/11 1005

## 2011-11-24 NOTE — ED Notes (Signed)
Pt given contrast for CT.

## 2011-11-24 NOTE — ED Notes (Signed)
Pt to CT Scan

## 2011-11-24 NOTE — ED Notes (Signed)
PA updated on no relief from nausea medications, no vomiting at this time, pt given emesis bag

## 2011-11-24 NOTE — ED Provider Notes (Signed)
Assumed patient care at 3126. 61 year old female presents with a chief complaints of nausea and diarrhea. States she has seen. Nausea and multiple watery diarrhea. She does have a history of GI issues and currently following Dr. Marina Goodell. She also admits to taking the Z-Pak for 4 weeks ago for a dental infection she is currently awaiting abdominal CT scan for further evaluation. C. difficile precautions initiated. Plan to reassess patient's and distal pending CT scan finding.  3:50 PM Labs shows normal white count. Electrolytes are within normal limits. No significant signs of dehydration her lab values. Pt currently in NAD.  VSS, have not have any BM  7:40 PM CT scan shows no acute finding.  Pt is back to her normal baseline.  Denies Nausea or diarrhea.  Low suspicion for C.diff.  Reassurance given. Heart RRR, no M/R/G, lung with decrease breath sounds but no W/R/R.  abd soft and nontender.  Pt agrees to f/u with PCP and with GI specialist.    Fayrene Helper, PA-C 11/24/11 1942

## 2011-11-24 NOTE — Discharge Instructions (Signed)
Diet for Diarrhea, Adult Having frequent, runny stools (diarrhea) has many causes. Diarrhea may be caused or worsened by food or drink. Diarrhea may be relieved by changing your diet. IF YOU ARE NOT TOLERATING SOLID FOODS:  Drink enough water and fluids to keep your urine clear or pale yellow.   Avoid sugary drinks and sodas as well as milk-based beverages.   Avoid beverages containing caffeine and alcohol.   You may try rehydrating beverages. You can make your own by following this recipe:    tsp table salt.    tsp baking soda.   ? tsp salt substitute (potassium chloride).   1 tbs + 1 tsp sugar.   1 qt water.  As your stools become more solid, you can start eating solid foods. Add foods one at a time. If a certain food causes your diarrhea to get worse, avoid that food and try other foods. A low fiber, low-fat, and lactose-free diet is recommended. Small, frequent meals may be better tolerated.  Starches  Allowed:  White, French, and pita breads, plain rolls, buns, bagels. Plain muffins, matzo. Soda, saltine, or graham crackers. Pretzels, melba toast, zwieback. Cooked cereals made with water: cornmeal, farina, cream cereals. Dry cereals: refined corn, wheat, rice. Potatoes prepared any way without skins, refined macaroni, spaghetti, noodles, refined rice.   Avoid:  Bread, rolls, or crackers made with whole wheat, multi-grains, rye, bran seeds, nuts, or coconut. Corn tortillas or taco shells. Cereals containing whole grains, multi-grains, bran, coconut, nuts, or raisins. Cooked or dry oatmeal. Coarse wheat cereals, granola. Cereals advertised as "high-fiber." Potato skins. Whole grain pasta, wild or brown rice. Popcorn. Sweet potatoes/yams. Sweet rolls, doughnuts, waffles, pancakes, sweet breads.  Vegetables  Allowed: Strained tomato and vegetable juices. Most well-cooked and canned vegetables without seeds. Fresh: Tender lettuce, cucumber without the skin, cabbage, spinach, bean  sprouts.   Avoid: Fresh, cooked, or canned: Artichokes, baked beans, beet greens, broccoli, Brussels sprouts, corn, kale, legumes, peas, sweet potatoes. Cooked: Green or red cabbage, spinach. Avoid large servings of any vegetables, because vegetables shrink when cooked, and they contain more fiber per serving than fresh vegetables.  Fruit  Allowed: All fruit juices except prune juice. Cooked or canned: Apricots, applesauce, cantaloupe, cherries, fruit cocktail, grapefruit, grapes, kiwi, mandarin oranges, peaches, pears, plums, watermelon. Fresh: Apples without skin, ripe banana, grapes, cantaloupe, cherries, grapefruit, peaches, oranges, plums. Keep servings limited to  cup or 1 piece.   Avoid: Fresh: Apple with skin, apricots, mango, pears, raspberries, strawberries. Prune juice, stewed or dried prunes. Dried fruits, raisins, dates. Large servings of all fresh fruits.  Meat and Meat Substitutes  Allowed: Ground or well-cooked tender beef, ham, veal, lamb, pork, or poultry. Eggs, plain cheese. Fish, oysters, shrimp, lobster, other seafoods. Liver, organ meats.   Avoid: Tough, fibrous meats with gristle. Peanut butter, smooth or chunky. Cheese, nuts, seeds, legumes, dried peas, beans, lentils.  Milk  Allowed: Yogurt, lactose-free milk, kefir, drinkable yogurt, buttermilk, soy milk.   Avoid: Milk, chocolate milk, beverages made with milk, such as milk shakes.  Soups  Allowed: Bouillon, broth, or soups made from allowed foods. Any strained soup.   Avoid: Soups made from vegetables that are not allowed, cream or milk-based soups.  Desserts and Sweets  Allowed: Sugar-free gelatin, sugar-free frozen ice pops made without sugar alcohol.   Avoid: Plain cakes and cookies, pie made with allowed fruit, pudding, custard, cream pie. Gelatin, fruit, ice, sherbet, frozen ice pops. Ice cream, ice milk without nuts. Plain hard candy,   honey, jelly, molasses, syrup, sugar, chocolate syrup, gumdrops,  marshmallows.  Fats and Oils  Allowed: Avoid any fats and oils.   Avoid: Seeds, nuts, olives, avocados. Margarine, butter, cream, mayonnaise, salad oils, plain salad dressings made from allowed foods. Plain gravy, crisp bacon without rind.  Beverages  Allowed: Water, decaffeinated teas, oral rehydration solutions, sugar-free beverages.   Avoid: Fruit juices, caffeinated beverages (coffee, tea, soda or pop), alcohol, sports drinks, or lemon-lime soda or pop.  Condiments  Allowed: Ketchup, mustard, horseradish, vinegar, cream sauce, cheese sauce, cocoa powder. Spices in moderation: allspice, basil, bay leaves, celery powder or leaves, cinnamon, cumin powder, curry powder, ginger, mace, marjoram, onion or garlic powder, oregano, paprika, parsley flakes, ground pepper, rosemary, sage, savory, tarragon, thyme, turmeric.   Avoid: Coconut, honey.  Weight Monitoring: Weigh yourself every day. You should weigh yourself in the morning after you urinate and before you eat breakfast. Wear the same amount of clothing when you weigh yourself. Record your weight daily. Bring your recorded weights to your clinic visits. Tell your caregiver right away if you have gained 3 lb/1.4 kg or more in 1 day, 5 lb/2.3 kg in a week, or whatever amount you were told to report. SEEK IMMEDIATE MEDICAL CARE IF:   You are unable to keep fluids down.   You start to throw up (vomit) or diarrhea keeps coming back (persistent).   Abdominal pain develops, increases, or can be felt in one place (localizes).   You have an oral temperature above 102 F (38.9 C), not controlled by medicine.   Diarrhea contains blood or mucus.   You develop excessive weakness, dizziness, fainting, or extreme thirst.  MAKE SURE YOU:   Understand these instructions.   Will watch your condition.   Will get help right away if you are not doing well or get worse.  Document Released: 11/02/2003 Document Revised: 08/01/2011 Document Reviewed:  02/23/2009 [employer] Patient Information 2012 [employer]. 

## 2011-11-24 NOTE — ED Provider Notes (Signed)
History     CSN: [id number]  Arrival date & time 11/24/11  0845   First MD Initiated Contact with Patient 11/24/11 [phone]      Chief Complaint  Patient presents with  . Diarrhea    (Consider location/radiation/quality/duration/timing/severity/associated sxs/prior treatment) HPI  Patient presents to emergency department complaining of a one to two-week history of gradual onset nausea with gradually worsening nausea with acute onset epigastric pain and diarrhea that began 4 days ago. Patient states pain is constant in her epigastric region and associated with severe nausea. Patient states that she's had numerous watery diarrhea over the last 4 days with every bowel movement being watery. She denies any blood in her stool. Patient states that she was prescribed a Z-Pak 4 weeks ago for a dental infection however only completed 3 days worth because medication caused severe nausea. She's had no other antibiotics since then. Patient states she is followed by gastroenterologist, Dr. Marina Goodell, for history of recurrent nausea and "GI issues." Patient states she is supposed to take daily protonix but has not been able take the Protonix over thel last 4 days because of severe nausea. However she denies vomiting. She states decreased appetite due to severe nausea but again denies vomiting. She denies fevers. Patient states she's been taking Phenergan and Dramamine without relief of nausea. Patient's primary care physician is Dr. Felipa Eth. Patient denies any recent sick contacts. She denies any recent travel her hospitalization. She denies fevers but has felt chilled. She has chest pain, shortness of breath, lower bowel pain, dysuria, hematuria, blood in stool, or vaginal discharge. She denies aggravating or alleviating factors. Patient states she has allergy to all NSAIDS and was instructed by GI to never take ASA due to "severe stomach sensitivity."   Past Medical History  Diagnosis Date  . Diabetes mellitus   .  Kidney stones   . Meningitis     Past Surgical History  Procedure Date  . Back surgery   . Abdominal hysterectomy   . Tonsillectomy     No family history on file.  History  Substance Use Topics  . Smoking status: Never Smoker   . Smokeless tobacco: Not on file  . Alcohol Use: No    OB History    Grav Para Term Preterm Abortions TAB SAB Ect Mult Living                  Review of Systems  All other systems reviewed and are negative.    Allergies  Toradol; Benicar; Codeine; Erythromycin; and Lisinopril  Home Medications   Current Outpatient Rx  Name Route Sig Dispense Refill  . ACETAMINOPHEN 500 MG PO TABS Oral Take 1,000 mg by mouth every 6 (six) hours as needed. For pain    . METFORMIN HCL 500 MG PO TABS Oral Take 500 mg by mouth 2 (two) times daily with a meal.    . OMEPRAZOLE 20 MG PO CPDR Oral Take 20 mg by mouth daily.    Frazier Butt OP Ophthalmic Apply 2 drops to eye 2 (two) times daily as needed. For burning or dry eyes    . SIMVASTATIN 80 MG PO TABS Oral Take 80 mg by mouth at bedtime.    Marland Kitchen VITAMIN D (ERGOCALCIFEROL) 50000 UNITS PO CAPS Oral Take 50,000 Units by mouth every 7 (seven) days. On Sundays      BP 137/81  Pulse 87  Temp(Src) 98 F (36.7 C) (Oral)  Resp 15  Ht 5\' 3"  (1.6 m)  Wt 145 lb (65.772 kg)  BMI 25.69 kg/m2  SpO2 98%  Physical Exam  Nursing note and vitals reviewed. Constitutional: She is oriented to person, place, and time. She appears well-developed and well-nourished. No distress.  HENT:  Head: Normocephalic and atraumatic.  Eyes: Conjunctivae are normal.  Neck: Normal range of motion. Neck supple.  Cardiovascular: Normal rate, regular rhythm, normal heart sounds and intact distal pulses.  Exam reveals no gallop and no friction rub.   No murmur heard. Pulmonary/Chest: Effort normal and breath sounds normal. No respiratory distress. She has no wheezes. She has no rales. She exhibits no tenderness.  Abdominal: Soft. Bowel sounds  are normal. She exhibits no distension and no mass. There is tenderness. There is no rebound and no guarding.       Epigastric TTP that is moderate. No rebound or rigidity.   Musculoskeletal: Normal range of motion. She exhibits no edema and no tenderness.  Neurological: She is alert and oriented to person, place, and time.  Skin: Skin is warm and dry. No rash noted. She is not diaphoretic. No erythema.  Psychiatric: She has a normal mood and affect.    ED Course  Procedures (including critical care time)  IV fluids, zofran, reglan, and dilaudid. IV protonix.   Patient has been having ongoing nausea despite anti-emetics however NO vomiting throughout ER stay. She is slowly drinking PO contrast for CT scan.   Labs Reviewed  GLUCOSE, CAPILLARY - Abnormal; Notable for the following:    Glucose-Capillary 114 (*)    All other components within normal limits  COMPREHENSIVE METABOLIC PANEL - Abnormal; Notable for the following:    Glucose, Bld 103 (*)    GFR calc non Af Amer 60 (*)    GFR calc Af Amer 69 (*)    All other components within normal limits  URINALYSIS, ROUTINE W REFLEX MICROSCOPIC - Abnormal; Notable for the following:    Ketones, ur 15 (*)    All other components within normal limits  CBC  DIFFERENTIAL  LIPASE, BLOOD  STOOL CULTURE  CLOSTRIDIUM DIFFICILE BY PCR   No results found.   No diagnosis found.    MDM  Sign out given to Fayrene Helper PA-C to follow CT scan findings. Patient has tolerated PO contrast without vomiting. States nausea has improved after ativan. VSS. dispo pending CT scan findings and re-evaluation.         Jenness Corner, [place] 11/24/11 [phone]

## 2011-11-25 NOTE — ED Provider Notes (Signed)
Medical screening examination/treatment/procedure(s) were performed by non-physician practitioner and as supervising physician I was immediately available for consultation/collaboration.   Carleene Cooper III, MD 11/25/11 [phone]

## 2011-11-25 NOTE — ED Provider Notes (Signed)
Medical screening examination/treatment/procedure(s) were conducted as a shared visit with non-physician practitioner(s) and myself.  I personally evaluated the patient during the encounter 51 you woman with Hx nausea, no vomiting, some epigastric pain and diarrhea.  Exam shows her to be anxious-appearing, mild epigastric tenderness.  Lab workup negative.  CT of abdomen/pelvis pending.  Carleene Cooper III, MD 11/25/11 [phone]

## 2011-12-02 ENCOUNTER — Telehealth: Payer: Self-pay | Admitting: Internal Medicine

## 2011-12-02 NOTE — Telephone Encounter (Signed)
Pt scheduled to see Dr. Marina Goodell tomorrow at 8:45am. Malachi Bonds to fax records and notify pt of appt date and time.

## 2011-12-03 ENCOUNTER — Encounter: Payer: Self-pay | Admitting: Internal Medicine

## 2011-12-03 ENCOUNTER — Ambulatory Visit (INDEPENDENT_AMBULATORY_CARE_PROVIDER_SITE_OTHER): Payer: Medicare Other | Admitting: Internal Medicine

## 2011-12-03 VITALS — BP 140/82 | HR 72 | Ht 63.0 in | Wt 147.0 lb

## 2011-12-03 DIAGNOSIS — K589 Irritable bowel syndrome without diarrhea: Secondary | ICD-10-CM

## 2011-12-03 DIAGNOSIS — Z860100 Personal history of colon polyps, unspecified: Secondary | ICD-10-CM

## 2011-12-03 DIAGNOSIS — K219 Gastro-esophageal reflux disease without esophagitis: Secondary | ICD-10-CM

## 2011-12-03 DIAGNOSIS — R110 Nausea: Secondary | ICD-10-CM

## 2011-12-03 DIAGNOSIS — R1013 Epigastric pain: Secondary | ICD-10-CM

## 2011-12-03 DIAGNOSIS — R634 Abnormal weight loss: Secondary | ICD-10-CM

## 2011-12-03 NOTE — Progress Notes (Signed)
HISTORY OF PRESENT ILLNESS:  Tiffany [name] is a 61 y.o. female with multiple significant medical problems including spina bifida with occult spinal dysraphism (status post T12 through L5 laminectomies), hyperlipidemia, depression, hypertension, and kidney stones. She has been followed in this office for GERD status post fundoplication, irritable bowel syndrome, and functional abdominal complaints. She was last seen in followup September 2009. This after having undergone surveillance colonoscopy for tubular adenomas in March of 2009. As well, upper endoscopy with esophageal dilation in March of 2009. The clinical impression at that time was epigastric and chest discomfort, likely functional. 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 She presents today with a chief complaint of nausea. She states this began about one month ago. No vomiting. She went to the emergency room about one week ago ( emergency room encounter reviewed) and was treated with IV fluids, pain medication, and Reglan. She denies any new medications except for antilipid agent which has been stopped. She also complains of squeezing vague epigastric discomfort. No exacerbating or relieving factors. As well some intermittent loose stools and lower abdominal cramping. This is similar to her irritable bowel complaints. She feels that all of these complaints however are new. She reports 10 pound weight loss over the past month. She was seen by her PCP, Dr. Felipa [name], yesterday who prescribed Valium (office encounter reviewed). Laboratories from yesterday were unremarkable including normal CBC and comprehensive metabolic panel (potassium 3.4, glucose 115).. She states this helps. She has had good control of her blood sugars. Hemoglobin A1c yesterday 6.1. She denies constipation. No bleeding. She did have a Z-Pak in early March, which she stopped due to intolerance.  However, GI symptoms preceded this medication. She was given Zofran once. Not sure if it helps. Promethazine does help. She takes it on demand. She does tell me that she stopped her PPI about 2 weeks ago stating that "I just can't take the pills". She also tells me that she is not taking her diabetic medications currently.  REVIEW OF SYSTEMS:  All non-GI ROS negative except for sinus allergy trouble, back pain, fatigue, dizziness, depression.  Past Medical History  Diagnosis Date  . Diabetes mellitus   . Kidney stones   . Meningitis   . Hypertension   . GERD (gastroesophageal reflux disease)   . Hiatal hernia   . Gastritis   . IBS (irritable bowel syndrome)   . Depression   . Kidney stones   . Colon polyps   . Spina bifida occulta   . Insomnia     Past Surgical History  Procedure Date  . Back surgery   . Abdominal hysterectomy   . Tonsillectomy   . Polypectomy   . Meniscus repair   . Fundoplication   . Lithotripsy   . Elbow surgery     left    Social History Tiffany [name]  reports that she has never smoked. She has never used smokeless tobacco. She reports that she does not drink alcohol or use illicit drugs.  family history includes Alzheimer's disease in her father; Cancer in her mother; Colon cancer in her mother; Coronary artery disease in an unspecified family member; Diabetes in her mother; Gout in her father; Hypertension in her mother; Irritable bowel syndrome in her father; Nephrolithiasis in her brother and mother; Ovarian cancer in her mother; Thyroid cancer in her sister; and Ulcers in her father.  Allergies  Allergen Reactions  . Toradol Anaphylaxis  .  Benicar (Olmesartan Medoxomil) Other (See Comments)    Swelling of throat  . Codeine Nausea And Vomiting  . Erythromycin Nausea And Vomiting  . Lisinopril Other (See Comments)     Swelling of throat       PHYSICAL EXAMINATION: Vital signs: BP 140/82  Pulse 72  Ht 5\' 3"  (1.6 m)  Wt 147 lb (66.679  kg)  BMI 26.04 kg/m2  Constitutional: generally well-appearing, no acute distress Psychiatric: alert and oriented x3, cooperative. Appears somewhat depressed Eyes: extraocular movements intact, anicteric, conjunctiva pink Mouth: oral pharynx moist, no lesions. No thrush Neck: supple no lymphadenopathy Cardiovascular: heart regular rate and rhythm, no murmur Lungs: clear to auscultation bilaterally Abdomen: soft, nontender, nondistended, no obvious ascites, no peritoneal signs, normal bowel sounds, no organomegaly Extremities: no lower extremity edema bilaterally. Leg brace on left lower extremity Skin: no lesions on visible extremities Neuro: No focal deficits.   ASSESSMENT:  #1. Nausea without vomiting. Prior history of the same felt to be functional. Current cause of symptom uncertain. #2. Weight loss #3. Vague epigastric discomfort. Prior history of the same, felt to be functional #4. Diarrhea predominant irritable bowel syndrome. Appears to be ongoing #5. GERD. Status post remote fundoplication. Has been off PPI for several weeks without increased GERD symptoms #6. History of adenomatous colon polyps. Last colonoscopy March 2009   PLAN:  #1. Schedule diagnostic upper endoscopy.The nature of the procedure, as well as the risks, benefits, and alternatives were carefully and thoroughly reviewed with the patient. Ample time for discussion and questions allowed. The patient understood, was satisfied, and agreed to proceed.  #2. Take promethazine every 6 hours on a scheduled basis, for now #3. If endoscopy negative then abdominal ultrasound to rule out occult gallbladder disease #4. If ultrasound negative, then solid-phase gastric emptying scan to rule out gastroparesis. #5. If all the above unrevealing, and symptoms persist, and the cause is likely functional. She may benefit from antidepressant therapy. She is currently on no antidepressant therapy. We would leave this to the discretion  of Dr. Felipa [name]. #6. Surveillance colonoscopy around March of 2014

## 2011-12-03 NOTE — Patient Instructions (Signed)
You have been scheduled for an endoscopy with propofol. Please follow written instructions given to you at your visit today.  

## 2011-12-04 ENCOUNTER — Encounter: Payer: Self-pay | Admitting: Internal Medicine

## 2011-12-04 ENCOUNTER — Other Ambulatory Visit: Payer: Self-pay | Admitting: Internal Medicine

## 2011-12-04 ENCOUNTER — Ambulatory Visit (AMBULATORY_SURGERY_CENTER): Payer: Medicare Other | Admitting: Internal Medicine

## 2011-12-04 VITALS — BP 167/87 | HR 89 | Temp 99.5°F | Resp 20 | Ht 63.0 in | Wt 143.0 lb

## 2011-12-04 DIAGNOSIS — R1032 Left lower quadrant pain: Secondary | ICD-10-CM

## 2011-12-04 DIAGNOSIS — R1013 Epigastric pain: Secondary | ICD-10-CM

## 2011-12-04 DIAGNOSIS — K219 Gastro-esophageal reflux disease without esophagitis: Secondary | ICD-10-CM

## 2011-12-04 DIAGNOSIS — R110 Nausea: Secondary | ICD-10-CM

## 2011-12-04 DIAGNOSIS — R634 Abnormal weight loss: Secondary | ICD-10-CM

## 2011-12-04 LAB — GLUCOSE, CAPILLARY
Glucose-Capillary: 83 mg/dL (ref 70–99)
Glucose-Capillary: 85 mg/dL (ref 70–99)

## 2011-12-04 MED ORDER — SODIUM CHLORIDE 0.9 % IV SOLN: 500.0000 mL | INTRAVENOUS | Status: DC

## 2011-12-04 NOTE — Progress Notes (Signed)
Patient did not experience any of the following events: a burn prior to discharge; a fall within the facility; wrong site/side/patient/procedure/implant event; or a hospital transfer or hospital admission upon discharge from the facility. (G8907) Patient did not have preoperative order for IV antibiotic SSI prophylaxis. (G8918)  

## 2011-12-04 NOTE — Op Note (Signed)
[facility] [address] [place], [place]  [address]  ENDOSCOPY PROCEDURE REPORT  PATIENT:  Tiffany [name], Tiffany [name]  MR#:  [id number] BIRTHDATE:  12-15-1950, 60 yrs. old  GENDER:  female  ENDOSCOPIST:  Wilhemina Bonito. Eda Keys, MD Referred by:  Office  PROCEDURE DATE:  12/04/2011 PROCEDURE:  EGD, diagnostic 19147 ASA CLASS:  Class II INDICATIONS:  nausea, epigastric pain  MEDICATIONS:   MAC sedation, administered by CRNA, propofol (Diprivan) 150 mg IV TOPICAL ANESTHETIC:  none  DESCRIPTION OF PROCEDURE:   After the risks benefits and alternatives of the procedure were thoroughly explained, informed consent was obtained.  The LB GIF-H180 [id number] endoscope was introduced through the mouth and advanced to the second portion of the duodenum, without limitations.  The instrument was slowly withdrawn as the mucosa was fully examined. <<PROCEDUREIMAGES>>  S/p fundoplication. Loose wrap. The upper, middle, and distal third of the esophagus were carefully inspected and no abnormalities were noted. The z-line was well seen at the GEJ. The endoscope was pushed into the fundus which was normal including a retroflexed view. The antrum,gastric body, first and second part of the duodenum were unremarkable.    Retroflexed views revealed prior fundoplication.    The scope was then withdrawn from the patient and the procedure completed.  COMPLICATIONS:  None  ENDOSCOPIC IMPRESSION: 1) S/p fundoplication 2) Otherwise Normal EGD 3) Office follow up after the above completed  RECOMMENDATIONS: 1) My office will arrange for you to have an abdominal ultrasound performed "nausea , epigastric pain". 2) My office will arrange for you to have a Gastric Emptying Scan " r/o gastroparesis" performed. This is a radiology test that gives an idea of how well your stomach functions.  ______________________________ Wilhemina Bonito. Eda Keys, MD  CC:  Chilton Greathouse, MD; The Patient  n. eSIGNED:   Wilhemina Bonito.  Eda Keys at 12/04/2011 02:40 PM  Burgess Amor, [phone]

## 2011-12-04 NOTE — Patient Instructions (Signed)
Normal EGD.  The office will call you to schedule abdominal ultrasound and gastric emptying scan.   YOU HAD AN ENDOSCOPIC PROCEDURE TODAY AT THE [facility]: Refer to the procedure report that was given to you for any specific questions about what was found during the examination.  If the procedure report does not answer your questions, please call your gastroenterologist to clarify.  If you requested that your care partner not be given the details of your procedure findings, then the procedure report has been included in a sealed envelope for you to review at your convenience later.  YOU SHOULD EXPECT: Some feelings of bloating in the abdomen. Passage of more gas than usual.  Walking can help get rid of the air that was put into your GI tract during the procedure and reduce the bloating. If you had a lower endoscopy (such as a colonoscopy or flexible sigmoidoscopy) you may notice spotting of blood in your stool or on the toilet paper. If you underwent a bowel prep for your procedure, then you may not have a normal bowel movement for a few days.  DIET: Your first meal following the procedure should be a light meal and then it is ok to progress to your normal diet.  A half-sandwich or bowl of soup is an example of a good first meal.  Heavy or fried foods are harder to digest and may make you feel nauseous or bloated.  Likewise meals heavy in dairy and vegetables can cause extra gas to form and this can also increase the bloating.  Drink plenty of fluids but you should avoid alcoholic beverages for 24 hours.  ACTIVITY: Your care partner should take you home directly after the procedure.  You should plan to take it easy, moving slowly for the rest of the day.  You can resume normal activity the day after the procedure however you should NOT DRIVE or use heavy machinery for 24 hours (because of the sedation medicines used during the test).    SYMPTOMS TO REPORT IMMEDIATELY: A gastroenterologist  can be reached at any hour.  During normal business hours, 8:30 AM to 5:00 PM Monday through Friday, call [phone].  After hours and on weekends, please call the GI answering service at [phone] who will take a message and have the physician on call contact you.   Following upper endoscopy (EGD)  Vomiting of blood or coffee ground material  New chest pain or pain under the shoulder blades  Painful or persistently difficult swallowing  New shortness of breath  Fever of 100F or higher  Black, tarry-looking stools  FOLLOW UP: If any biopsies were taken you will be contacted by phone or by letter within the next 1-3 weeks.  Call your gastroenterologist if you have not heard about the biopsies in 3 weeks.  Our staff will call the home number listed on your records the next business day following your procedure to check on you and address any questions or concerns that you may have at that time regarding the information given to you following your procedure. This is a courtesy call and so if there is no answer at the home number and we have not heard from you through the emergency physician on call, we will assume that you have returned to your regular daily activities without incident.  SIGNATURES/CONFIDENTIALITY: You and/or your care partner have signed paperwork which will be entered into your electronic medical record.  These signatures attest to the fact that  that the information above on your After Visit Summary has been reviewed and is understood.  Full responsibility of the confidentiality of this discharge information lies with you and/or your care-partner.

## 2011-12-05 ENCOUNTER — Telehealth: Payer: Self-pay

## 2011-12-05 ENCOUNTER — Telehealth: Payer: Self-pay | Admitting: *Deleted

## 2011-12-05 NOTE — Telephone Encounter (Signed)
  Follow up Call-  Call back number 12/04/2011  Post procedure Call Back phone  # [phone]  Permission to leave phone message Yes     Patient questions:  Do you have a fever, pain , or abdominal swelling? no Pain Score  0 *  Have you tolerated food without any problems? yes  Have you been able to return to your normal activities? yes  Do you have any questions about your discharge instructions: Diet   no Medications  no Follow up visit  no  Do you have questions or concerns about your Care? no  Actions: * If pain score is 4 or above: No action needed, pain <4.

## 2011-12-05 NOTE — Telephone Encounter (Signed)
Pt scheduled for abdominal ultrasound 12/09/11 arrival time 8:45am for a 9am appt. Pt to be NPO after midnight. Pt scheduled for GES 12/17/11 arrival time 8:45am for a 9am appt. Pt to be NPO after midnight and hold her prilosec the day before. Both procedures ordered at [facility]. Pt aware of appt dates and times.

## 2011-12-09 ENCOUNTER — Ambulatory Visit (HOSPITAL_COMMUNITY)
Admission: RE | Admit: 2011-12-09 | Discharge: 2011-12-09 | Disposition: A | Discharge status: Home / Self Care | Payer: Medicare Other | Source: Ambulatory Visit | Attending: Internal Medicine | Admitting: Internal Medicine

## 2011-12-09 DIAGNOSIS — R110 Nausea: Secondary | ICD-10-CM | POA: Insufficient documentation

## 2011-12-09 DIAGNOSIS — R1013 Epigastric pain: Secondary | ICD-10-CM | POA: Insufficient documentation

## 2011-12-17 ENCOUNTER — Encounter (HOSPITAL_COMMUNITY)
Admission: RE | Admit: 2011-12-17 | Discharge: 2011-12-17 | Disposition: A | Discharge status: Home / Self Care | Payer: Medicare Other | Source: Ambulatory Visit | Attending: Internal Medicine | Admitting: Internal Medicine

## 2011-12-17 DIAGNOSIS — R110 Nausea: Secondary | ICD-10-CM

## 2011-12-17 DIAGNOSIS — R1013 Epigastric pain: Secondary | ICD-10-CM

## 2011-12-17 DIAGNOSIS — R6881 Early satiety: Secondary | ICD-10-CM | POA: Insufficient documentation

## 2011-12-17 MED ORDER — TECHNETIUM TC 99M SULFUR COLLOID
2.1000 | Freq: Once | INTRAVENOUS | Status: AC | PRN
  Administered 2011-12-17: 2.1 via INTRAVENOUS

## 2012-01-07 ENCOUNTER — Ambulatory Visit: Payer: Medicare Other | Admitting: Internal Medicine

## 2012-01-13 ENCOUNTER — Other Ambulatory Visit (HOSPITAL_COMMUNITY): Payer: Self-pay | Admitting: Internal Medicine

## 2012-01-13 DIAGNOSIS — Z1231 Encounter for screening mammogram for malignant neoplasm of breast: Secondary | ICD-10-CM

## 2012-02-13 ENCOUNTER — Ambulatory Visit (HOSPITAL_COMMUNITY)
Admission: RE | Admit: 2012-02-13 | Discharge: 2012-02-13 | Disposition: A | Discharge status: Home / Self Care | Payer: Medicare Other | Source: Ambulatory Visit | Attending: Internal Medicine | Admitting: Internal Medicine

## 2012-02-13 DIAGNOSIS — Z1231 Encounter for screening mammogram for malignant neoplasm of breast: Secondary | ICD-10-CM | POA: Insufficient documentation

## 2012-03-26 HISTORY — PX: CATARACT EXTRACTION: SUR2

## 2012-09-07 ENCOUNTER — Ambulatory Visit (INDEPENDENT_AMBULATORY_CARE_PROVIDER_SITE_OTHER): Payer: Medicare Other | Admitting: Internal Medicine

## 2012-09-07 ENCOUNTER — Encounter: Payer: Self-pay | Admitting: Internal Medicine

## 2012-09-07 VITALS — BP 120/72 | HR 76 | Ht 63.0 in | Wt 166.2 lb

## 2012-09-07 DIAGNOSIS — R195 Other fecal abnormalities: Secondary | ICD-10-CM

## 2012-09-07 DIAGNOSIS — Z86010 Personal history of colonic polyps: Secondary | ICD-10-CM

## 2012-09-07 DIAGNOSIS — R1031 Right lower quadrant pain: Secondary | ICD-10-CM

## 2012-09-07 MED ORDER — MOVIPREP 100 G PO SOLR: 1.0000 | Freq: Once | ORAL | Status: DC

## 2012-09-07 NOTE — Patient Instructions (Addendum)
You have been scheduled for a colonoscopy with propofol. Please follow written instructions given to you at your visit today.  Please pick up your prep kit at the pharmacy within the next 1-3 days. If you use inhalers (even only as needed) or a CPAP machine, please bring them with you on the day of your procedure.  

## 2012-09-07 NOTE — Progress Notes (Signed)
HISTORY OF PRESENT ILLNESS:  Tiffany [name] is a 62 y.o. female with multiple significant medical problems including spina bifida with occult spinal dysraphism (status post T12 through L5 laminectomies), hyperlipidemia, depression, hypertension, and kidney stones. She has been followed in this office for GERD status post fundoplication, irritable bowel syndrome, and functional abdominal complaints. She was last seen in April 2013 4 nausea, weight loss, and vague epigastric discomfort. See that dictation for details peer she was prescribed promethazine. Abdominal ultrasound and gastric emptying studies were unremarkable. Upper endoscopy was unrevealing. In time, symptoms improved nonspecifically. She is sent today regarding Hemoccult-positive stool noticed on outpatient testing. Review of outside laboratories finds a normal CBC and comprehensive metabolic panel. She does have a history of adenomatous colon polyps with index colonoscopy in 2006 and surveillance colonoscopy March 2009. She is due for routine followup at this time. Her GI review of systems is remarkable for minor intermittent rectal bleeding, chronic diarrhea with intermittent fecal incontinence, and right lower quadrant abdominal discomfort of 6 months duration. She states that the abdominal discomfort has worsened recently and interrupts sleep. She feels that this is different than her irritable bowel. Antispasmodics have not helped. She has had weight gain of about 19 pounds since her last visit. No other GI complaints.   REVIEW OF SYSTEMS:  All non-GI ROS negative except for back pain  Past Medical History  Diagnosis Date  . Diabetes mellitus   . Kidney stones   . Meningitis   . Hypertension   . GERD (gastroesophageal reflux disease)   . Hiatal hernia   . Gastritis   . IBS (irritable bowel syndrome)   . Depression   . Kidney stones   . Colon polyps   . Spina bifida occulta   . Insomnia     Past Surgical History  Procedure  Date  . Back surgery   . Abdominal hysterectomy   . Tonsillectomy   . Polypectomy   . Meniscus repair   . Fundoplication   . Lithotripsy   . Elbow surgery     left  . Cataract extraction 03/2012    bilateral    Social History Tiffany [name]  reports that she has never smoked. She has never used smokeless tobacco. She reports that she does not drink alcohol or use illicit drugs.  family history includes Alzheimer's disease in her father; Cancer in her mother; Colon cancer in her mother; Coronary artery disease in an unspecified family member; Diabetes in her mother; Gout in her father; Hypertension in her mother; Irritable bowel syndrome in her father; Nephrolithiasis in her brother and mother; Ovarian cancer in her mother; Thyroid cancer in her sister; and Ulcers in her father.  Allergies  Allergen Reactions  . Ketorolac Tromethamine Anaphylaxis  . Toradol (Ketorolac Tromethamine) Anaphylaxis  . Ambien (Zolpidem Tartrate)     Nightmares and insomnia  . Aspirin     GI bleed  . Benicar (Olmesartan Medoxomil) Other (See Comments)    Swelling of throat  . Codeine Nausea And Vomiting  . Erythromycin Nausea And Vomiting  . Fenofibrate Nausea And Vomiting  . Glucosamine Forte (Nutritional Supplements) Diarrhea and Nausea Only  . Levaquin (Levofloxacin In D5w) Diarrhea and Swelling  . Lisinopril Other (See Comments)     Swelling of throat  . Percocet (Oxycodone-Acetaminophen) Nausea And Vomiting  . Reclast (Zoledronic Acid)     Ocular reaction, toxic  . Tizanidine   . Tramadol Nausea And Vomiting       PHYSICAL  EXAMINATION: Vital signs: BP 120/72  Pulse 76  Ht 5\' 3"  (1.6 m)  Wt 166 lb 3.2 oz (75.388 kg)  BMI 29.44 kg/m2  Constitutional: generally well-appearing, no acute distress Psychiatric: alert and oriented x3, cooperative Eyes: extraocular movements intact, anicteric, conjunctiva pink Mouth: oral pharynx moist, no lesions Neck: supple no  lymphadenopathy Cardiovascular: heart regular rate and rhythm, no murmur Lungs: clear to auscultation bilaterally Abdomen: soft, mild tenderness in the lower abdomen to palpation, nondistended, no obvious ascites, no peritoneal signs, normal bowel sounds, no organomegaly Rectal: Deferred until colonoscopy Extremities: no lower extremity edema bilaterally Skin: no lesions on visible extremities Neuro: Alert and oriented. Cranial nerves intact  ASSESSMENT:  #1. Hemoccult-positive stool #2. GERD status post fundoplication. Negative endoscopy April 2013 #3. History of adenomatous colon polyps. 2006 and 2009. Due for followup #4. IBS #5. Complaints of right lower quadrant pain. Etiology uncertain   PLAN:  #1. Colonoscopy for polyp surveillance and evaluation of Hemoccult-positive stool.The nature of the procedure, as well as the risks, benefits, and alternatives were carefully and thoroughly reviewed with the patient. Ample time for discussion and questions allowed. The patient understood, was satisfied, and agreed to proceed.  #2. Movi prep prescribed. The patient instructed on its use #3. CT of the abdomen and pelvis if no etiology for pain found on colonoscopy

## 2012-09-16 ENCOUNTER — Other Ambulatory Visit: Payer: Self-pay | Admitting: Internal Medicine

## 2012-09-16 ENCOUNTER — Ambulatory Visit (AMBULATORY_SURGERY_CENTER): Payer: Medicare Other | Admitting: Internal Medicine

## 2012-09-16 ENCOUNTER — Telehealth: Payer: Self-pay

## 2012-09-16 ENCOUNTER — Encounter: Payer: Self-pay | Admitting: Internal Medicine

## 2012-09-16 VITALS — BP 140/75 | HR 65 | Temp 97.7°F | Resp 12 | Ht 63.0 in | Wt 166.0 lb

## 2012-09-16 DIAGNOSIS — Z86010 Personal history of colonic polyps: Secondary | ICD-10-CM

## 2012-09-16 DIAGNOSIS — R109 Unspecified abdominal pain: Secondary | ICD-10-CM

## 2012-09-16 DIAGNOSIS — D126 Benign neoplasm of colon, unspecified: Secondary | ICD-10-CM

## 2012-09-16 DIAGNOSIS — R195 Other fecal abnormalities: Secondary | ICD-10-CM

## 2012-09-16 DIAGNOSIS — Z1211 Encounter for screening for malignant neoplasm of colon: Secondary | ICD-10-CM

## 2012-09-16 MED ORDER — SODIUM CHLORIDE 0.9 % IV SOLN: 500.0000 mL | INTRAVENOUS | Status: DC

## 2012-09-16 NOTE — Progress Notes (Signed)
Patient did not experience any of the following events: a burn prior to discharge; a fall within the facility; wrong site/side/patient/procedure/implant event; or a hospital transfer or hospital admission upon discharge from the facility. (G8907) Patient did not have preoperative order for IV antibiotic SSI prophylaxis. (G8918)  

## 2012-09-16 NOTE — Progress Notes (Signed)
Called to room to assist during endoscopic procedure.  Patient ID and intended procedure confirmed with present staff. Received instructions for my participation in the procedure from the performing physician.  

## 2012-09-16 NOTE — Telephone Encounter (Signed)
Pt scheduled for CT of abdomen and pelvis for right lower quadrant abdominal pain. CT scheduled at [facility] CT 09/21/12 pt to arrive there at 9:45am for a 10am appt. Pt to hold her glucophage that day. Pt to be NPO after midnight except for 1st bottle of contrast at 8am, second bottle of contrast at 9am. Pt to come in the next couple of days for a cmet prior to the CT. Pt aware.

## 2012-09-16 NOTE — Progress Notes (Signed)
Drinda Butts called Bonita Quin downstairs to set up CT scan,it was not on report, Bonita Quin said she would contact pt with appointment time, gave pt ready-CAT for procedure

## 2012-09-16 NOTE — Op Note (Signed)
[facility] [address] [place], [address]   COLONOSCOPY PROCEDURE REPORT  PATIENT: Tiffany, [name]  MR#: [id number] BIRTHDATE: [birth year] , 61  yrs. old GENDER: Female ENDOSCOPIST: Roxy Cedar, MD REFERRED [id number] Program Recall PROCEDURE DATE:  09/16/2012 PROCEDURE:   Colonoscopy with snare polypectomy    x 3 ASA CLASS:   Class II INDICATIONS:Patient's personal history of adenomatous colon polyps - prior exams 2006; 2009 (and occult blood) . MEDICATIONS: MAC sedation, administered by CRNA and propofol (Diprivan) 500mg  IV  DESCRIPTION OF PROCEDURE:   After the risks benefits and alternatives of the procedure were thoroughly explained, informed consent was obtained.  A digital rectal exam revealed no abnormalities of the rectum.   The LB CF-[id number]  endoscope was introduced through the anus and advanced to the cecum, which was identified by both the appendix and ileocecal valve. No adverse events experienced.   The quality of the prep was excellent, using MoviPrep  The instrument was then slowly withdrawn as the colon was fully examined.      COLON FINDINGS: Three polyps measuring 2,2,8 mm in size were found at the cecum and in the ascending colon and removed with cold snare. All retrieved. Mild sigmoid diverticulosis. Otherwise normal exam.  Retroflexed views revealed no abnormalities. The time to cecum=2 minutes 57 seconds.  Withdrawal time=18 minutes 57 seconds. The scope was withdrawn and the procedure completed. COMPLICATIONS: There were no complications.  ENDOSCOPIC IMPRESSION: 1. Three polyps measuring 2,2,8 mm in size were found at the cecum and in the ascending colon and removed 2. Sigmoid divericulosis  RECOMMENDATIONS: 1. Follow up colonoscopy in 5 years   eSigned:  Roxy Cedar, MD 09/16/2012 10:22 AM   cc: Chilton Greathouse, MD and The Patient

## 2012-09-16 NOTE — Patient Instructions (Addendum)

## 2012-09-16 NOTE — Progress Notes (Signed)
Vss, A&O x3, Pleased with MAC, Report to April RN. DRM

## 2012-09-17 ENCOUNTER — Telehealth: Payer: Self-pay | Admitting: *Deleted

## 2012-09-17 ENCOUNTER — Other Ambulatory Visit (INDEPENDENT_AMBULATORY_CARE_PROVIDER_SITE_OTHER): Payer: Medicare Other

## 2012-09-17 DIAGNOSIS — R109 Unspecified abdominal pain: Secondary | ICD-10-CM

## 2012-09-17 LAB — COMPREHENSIVE METABOLIC PANEL
Albumin: 4.2 g/dL (ref 3.5–5.2)
Alkaline Phosphatase: 77 U/L (ref 39–117)
BUN: 19 mg/dL (ref 6–23)
CO2: 28 mEq/L (ref 19–32)
Calcium: 9.6 mg/dL (ref 8.4–10.5)
Chloride: 109 mEq/L (ref 96–112)
GFR: 53.15 mL/min — ABNORMAL LOW (ref >60.00)
Glucose, Bld: 127 mg/dL — ABNORMAL HIGH (ref 70–99)
Potassium: 4.5 mEq/L (ref 3.5–5.1)

## 2012-09-17 NOTE — Telephone Encounter (Signed)
  Follow up Call-  Call back number 09/16/2012 12/04/2011  Post procedure Call Back phone  # [phone]  Permission to leave phone message Yes Yes     Patient questions:  Do you have a fever, pain , or abdominal swelling? no Pain Score  0 *  Have you tolerated food without any problems? yes  Have you been able to return to your normal activities? yes  Do you have any questions about your discharge instructions: Diet   no Medications  no Follow up visit  no  Do you have questions or concerns about your Care? no  Actions: * If pain score is 4 or above: No action needed, pain <4.

## 2012-09-21 ENCOUNTER — Ambulatory Visit (INDEPENDENT_AMBULATORY_CARE_PROVIDER_SITE_OTHER)
Admission: RE | Admit: 2012-09-21 | Discharge: 2012-09-21 | Disposition: A | Discharge status: Home / Self Care | Payer: Medicare Other | Source: Ambulatory Visit | Attending: Internal Medicine | Admitting: Internal Medicine

## 2012-09-21 ENCOUNTER — Encounter: Payer: Self-pay | Admitting: Internal Medicine

## 2012-09-21 DIAGNOSIS — R109 Unspecified abdominal pain: Secondary | ICD-10-CM

## 2012-09-21 MED ORDER — IOHEXOL 300 MG/ML  SOLN
100.0000 mL | Freq: Once | INTRAMUSCULAR | Status: AC | PRN
  Administered 2012-09-21: 100 mL via INTRAVENOUS

## 2013-01-04 ENCOUNTER — Other Ambulatory Visit (HOSPITAL_COMMUNITY): Payer: Self-pay | Admitting: Internal Medicine

## 2013-01-04 DIAGNOSIS — Z1231 Encounter for screening mammogram for malignant neoplasm of breast: Secondary | ICD-10-CM

## 2013-02-23 ENCOUNTER — Ambulatory Visit (HOSPITAL_COMMUNITY)
Admission: RE | Admit: 2013-02-23 | Discharge: 2013-02-23 | Disposition: A | Discharge status: Home / Self Care | Payer: Medicare Other | Source: Ambulatory Visit | Attending: Internal Medicine | Admitting: Internal Medicine

## 2013-02-23 DIAGNOSIS — Z1231 Encounter for screening mammogram for malignant neoplasm of breast: Secondary | ICD-10-CM

## 2013-03-03 ENCOUNTER — Other Ambulatory Visit: Payer: Self-pay | Admitting: Internal Medicine

## 2013-03-03 DIAGNOSIS — N644 Mastodynia: Secondary | ICD-10-CM

## 2013-03-12 ENCOUNTER — Ambulatory Visit
Admission: RE | Admit: 2013-03-12 | Discharge: 2013-03-12 | Disposition: A | Discharge status: Home / Self Care | Payer: Medicare Other | Source: Ambulatory Visit | Attending: Internal Medicine | Admitting: Internal Medicine

## 2013-03-12 ENCOUNTER — Other Ambulatory Visit: Payer: Self-pay | Admitting: Internal Medicine

## 2013-03-12 DIAGNOSIS — N644 Mastodynia: Secondary | ICD-10-CM

## 2013-11-09 ENCOUNTER — Inpatient Hospital Stay (HOSPITAL_COMMUNITY)
Admission: EM | Admit: 2013-11-09 | Discharge: 2013-11-13 | DRG: 193 | Disposition: A | Discharge status: Home / Self Care | Payer: Medicare Other | Attending: Internal Medicine | Admitting: Internal Medicine

## 2013-11-09 ENCOUNTER — Other Ambulatory Visit: Payer: Self-pay | Admitting: Internal Medicine

## 2013-11-09 ENCOUNTER — Inpatient Hospital Stay (HOSPITAL_COMMUNITY): Payer: Medicare Other

## 2013-11-09 ENCOUNTER — Ambulatory Visit
Admission: RE | Admit: 2013-11-09 | Discharge: 2013-11-09 | Disposition: A | Discharge status: Home / Self Care | Payer: Medicare Other | Source: Ambulatory Visit | Attending: Internal Medicine | Admitting: Internal Medicine

## 2013-11-09 ENCOUNTER — Encounter (HOSPITAL_COMMUNITY): Payer: Self-pay | Admitting: Emergency Medicine

## 2013-11-09 DIAGNOSIS — J189 Pneumonia, unspecified organism: Secondary | ICD-10-CM | POA: Diagnosis present

## 2013-11-09 DIAGNOSIS — Z820 Family history of epilepsy and other diseases of the nervous system: Secondary | ICD-10-CM

## 2013-11-09 DIAGNOSIS — E785 Hyperlipidemia, unspecified: Secondary | ICD-10-CM | POA: Diagnosis present

## 2013-11-09 DIAGNOSIS — R1314 Dysphagia, pharyngoesophageal phase: Secondary | ICD-10-CM

## 2013-11-09 DIAGNOSIS — D126 Benign neoplasm of colon, unspecified: Secondary | ICD-10-CM

## 2013-11-09 DIAGNOSIS — J9601 Acute respiratory failure with hypoxia: Secondary | ICD-10-CM

## 2013-11-09 DIAGNOSIS — I10 Essential (primary) hypertension: Secondary | ICD-10-CM | POA: Diagnosis present

## 2013-11-09 DIAGNOSIS — Z800 Family history of malignant neoplasm of digestive organs: Secondary | ICD-10-CM

## 2013-11-09 DIAGNOSIS — Z808 Family history of malignant neoplasm of other organs or systems: Secondary | ICD-10-CM

## 2013-11-09 DIAGNOSIS — J159 Unspecified bacterial pneumonia: Principal | ICD-10-CM | POA: Diagnosis present

## 2013-11-09 DIAGNOSIS — Z881 Allergy status to other antibiotic agents status: Secondary | ICD-10-CM

## 2013-11-09 DIAGNOSIS — E860 Dehydration: Secondary | ICD-10-CM | POA: Diagnosis present

## 2013-11-09 DIAGNOSIS — Z8041 Family history of malignant neoplasm of ovary: Secondary | ICD-10-CM

## 2013-11-09 DIAGNOSIS — R918 Other nonspecific abnormal finding of lung field: Secondary | ICD-10-CM | POA: Diagnosis present

## 2013-11-09 DIAGNOSIS — E119 Type 2 diabetes mellitus without complications: Secondary | ICD-10-CM | POA: Diagnosis present

## 2013-11-09 DIAGNOSIS — Z885 Allergy status to narcotic agent status: Secondary | ICD-10-CM

## 2013-11-09 DIAGNOSIS — J9600 Acute respiratory failure, unspecified whether with hypoxia or hypercapnia: Secondary | ICD-10-CM | POA: Diagnosis present

## 2013-11-09 DIAGNOSIS — Z86010 Personal history of colonic polyps: Secondary | ICD-10-CM

## 2013-11-09 DIAGNOSIS — K222 Esophageal obstruction: Secondary | ICD-10-CM

## 2013-11-09 DIAGNOSIS — N289 Disorder of kidney and ureter, unspecified: Secondary | ICD-10-CM | POA: Diagnosis present

## 2013-11-09 DIAGNOSIS — Z833 Family history of diabetes mellitus: Secondary | ICD-10-CM

## 2013-11-09 DIAGNOSIS — R079 Chest pain, unspecified: Secondary | ICD-10-CM

## 2013-11-09 DIAGNOSIS — R1319 Other dysphagia: Secondary | ICD-10-CM

## 2013-11-09 DIAGNOSIS — Z8249 Family history of ischemic heart disease and other diseases of the circulatory system: Secondary | ICD-10-CM

## 2013-11-09 DIAGNOSIS — Z888 Allergy status to other drugs, medicaments and biological substances status: Secondary | ICD-10-CM

## 2013-11-09 DIAGNOSIS — K219 Gastro-esophageal reflux disease without esophagitis: Secondary | ICD-10-CM | POA: Diagnosis present

## 2013-11-09 DIAGNOSIS — K589 Irritable bowel syndrome without diarrhea: Secondary | ICD-10-CM | POA: Diagnosis present

## 2013-11-09 DIAGNOSIS — R1013 Epigastric pain: Secondary | ICD-10-CM

## 2013-11-09 DIAGNOSIS — R000 Tachycardia, unspecified: Secondary | ICD-10-CM | POA: Diagnosis present

## 2013-11-09 DIAGNOSIS — J479 Bronchiectasis, uncomplicated: Secondary | ICD-10-CM | POA: Diagnosis present

## 2013-11-09 DIAGNOSIS — Q760 Spina bifida occulta: Secondary | ICD-10-CM

## 2013-11-09 DIAGNOSIS — N179 Acute kidney failure, unspecified: Secondary | ICD-10-CM | POA: Diagnosis present

## 2013-11-09 LAB — BASIC METABOLIC PANEL
BUN: 19 mg/dL (ref 6–23)
CHLORIDE: 98 meq/L (ref 96–112)
CO2: 25 meq/L (ref 19–32)
CREATININE: 1.35 mg/dL — AB (ref 0.50–1.10)
Calcium: 9.7 mg/dL (ref 8.4–10.5)
GFR calc non Af Amer: 41 mL/min — ABNORMAL LOW (ref >90)
GFR, EST AFRICAN AMERICAN: 48 mL/min — AB (ref >90)
GLUCOSE: 149 mg/dL — AB (ref 70–99)
POTASSIUM: 4.4 meq/L (ref 3.7–5.3)
Sodium: 140 mEq/L (ref 137–147)

## 2013-11-09 LAB — CBC
HEMATOCRIT: 39.8 % (ref 36.0–46.0)
HEMOGLOBIN: 13.2 g/dL (ref 12.0–15.0)
MCH: 30.4 pg (ref 26.0–34.0)
MCHC: 33.2 g/dL (ref 30.0–36.0)
MCV: 91.7 fL (ref 78.0–100.0)
Platelets: 254 10*3/uL (ref 150–400)
RBC: 4.34 MIL/uL (ref 3.87–5.11)
RDW: 13.3 % (ref 11.5–15.5)
WBC: 9.1 10*3/uL (ref 4.0–10.5)

## 2013-11-09 LAB — MRSA PCR SCREENING: MRSA by PCR: NEGATIVE

## 2013-11-09 LAB — I-STAT TROPONIN, ED: Troponin i, poc: 0 ng/mL (ref 0.00–0.08)

## 2013-11-09 LAB — PRO B NATRIURETIC PEPTIDE: Pro B Natriuretic peptide (BNP): 22.1 pg/mL (ref 0–125)

## 2013-11-09 MED ORDER — DEXTROSE 5 % IV SOLN
1.0000 g | Freq: Three times a day (TID) | INTRAVENOUS | Status: DC
  Administered 2013-11-09 – 2013-11-10 (×2): 1 g via INTRAVENOUS
  Filled 2013-11-09 (×4): qty 1

## 2013-11-09 MED ORDER — GUAIFENESIN-DM 100-10 MG/5ML PO SYRP
5.0000 mL | ORAL_SOLUTION | ORAL | Status: DC | PRN
  Filled 2013-11-09: qty 5

## 2013-11-09 MED ORDER — SODIUM CHLORIDE 0.9 % IV SOLN
INTRAVENOUS | Status: DC
  Administered 2013-11-09: 20:00:00 via INTRAVENOUS
  Administered 2013-11-10: 75 mL/h via INTRAVENOUS
  Administered 2013-11-12: 1000 mL via INTRAVENOUS

## 2013-11-09 MED ORDER — IOHEXOL 350 MG/ML SOLN
100.0000 mL | Freq: Once | INTRAVENOUS | Status: AC | PRN
  Administered 2013-11-09: 100 mL via INTRAVENOUS

## 2013-11-09 MED ORDER — LEVALBUTEROL HCL 0.63 MG/3ML IN NEBU
0.6300 mg | INHALATION_SOLUTION | RESPIRATORY_TRACT | Status: DC | PRN
  Administered 2013-11-09 – 2013-11-10 (×2): 0.63 mg via RESPIRATORY_TRACT

## 2013-11-09 MED ORDER — DIAZEPAM 5 MG PO TABS: 10.0000 mg | ORAL_TABLET | Freq: Four times a day (QID) | ORAL | Status: DC | PRN

## 2013-11-09 MED ORDER — ACETAMINOPHEN 500 MG PO TABS: 1000.0000 mg | ORAL_TABLET | Freq: Four times a day (QID) | ORAL | Status: DC | PRN

## 2013-11-09 MED ORDER — PANTOPRAZOLE SODIUM 40 MG PO TBEC
40.0000 mg | DELAYED_RELEASE_TABLET | Freq: Every day | ORAL | Status: DC
  Administered 2013-11-09 – 2013-11-12 (×3): 40 mg via ORAL
  Filled 2013-11-09 (×2): qty 1

## 2013-11-09 MED ORDER — BENZONATATE 100 MG PO CAPS
100.0000 mg | ORAL_CAPSULE | Freq: Three times a day (TID) | ORAL | Status: DC
Start: 2013-11-09 — End: 2013-11-13
  Administered 2013-11-09 – 2013-11-13 (×12): 100 mg via ORAL
  Filled 2013-11-09 (×15): qty 1

## 2013-11-09 MED ORDER — METHYLPREDNISOLONE SODIUM SUCC 125 MG IJ SOLR
60.0000 mg | Freq: Four times a day (QID) | INTRAMUSCULAR | Status: DC
  Administered 2013-11-09 – 2013-11-10 (×4): 60 mg via INTRAVENOUS
  Administered 2013-11-10: 15:00:00 via INTRAVENOUS
  Administered 2013-11-11 (×3): 60 mg via INTRAVENOUS
  Filled 2013-11-09 (×13): qty 0.96

## 2013-11-09 MED ORDER — VANCOMYCIN HCL 10 G IV SOLR
1500.0000 mg | Freq: Once | INTRAVENOUS | Status: AC
  Administered 2013-11-09: 1500 mg via INTRAVENOUS
  Filled 2013-11-09: qty 1500

## 2013-11-09 MED ORDER — ATORVASTATIN CALCIUM 40 MG PO TABS
40.0000 mg | ORAL_TABLET | Freq: Every day | ORAL | Status: DC
  Administered 2013-11-10 – 2013-11-13 (×4): 40 mg via ORAL
  Filled 2013-11-09 (×4): qty 1

## 2013-11-09 MED ORDER — CALCIUM CARBONATE 1250 (500 CA) MG PO TABS
1.0000 | ORAL_TABLET | Freq: Every day | ORAL | Status: DC
  Administered 2013-11-10 – 2013-11-13 (×4): 500 mg via ORAL
  Filled 2013-11-09 (×5): qty 1

## 2013-11-09 MED ORDER — PIPERACILLIN-TAZOBACTAM 3.375 G IVPB 30 MIN
3.3750 g | Freq: Once | INTRAVENOUS | Status: AC
  Administered 2013-11-09: 3.375 g via INTRAVENOUS
  Filled 2013-11-09: qty 50

## 2013-11-09 MED ORDER — MOMETASONE FURO-FORMOTEROL FUM 100-5 MCG/ACT IN AERO
2.0000 | INHALATION_SPRAY | Freq: Two times a day (BID) | RESPIRATORY_TRACT | Status: DC
  Administered 2013-11-09 – 2013-11-13 (×7): 2 via RESPIRATORY_TRACT
  Filled 2013-11-09 (×2): qty 8.8

## 2013-11-09 MED ORDER — VANCOMYCIN HCL IN DEXTROSE 750-5 MG/150ML-% IV SOLN
750.0000 mg | Freq: Two times a day (BID) | INTRAVENOUS | Status: DC
  Administered 2013-11-10: 750 mg via INTRAVENOUS
  Filled 2013-11-09 (×2): qty 150

## 2013-11-09 MED ORDER — LEVALBUTEROL HCL 0.63 MG/3ML IN NEBU
0.6300 mg | INHALATION_SOLUTION | RESPIRATORY_TRACT | Status: DC
  Administered 2013-11-09 – 2013-11-11 (×8): 0.63 mg via RESPIRATORY_TRACT
  Filled 2013-11-09 (×22): qty 3

## 2013-11-09 MED ORDER — HYOSCYAMINE SULFATE 0.125 MG PO TABS
0.1250 mg | ORAL_TABLET | ORAL | Status: DC | PRN
  Filled 2013-11-09: qty 1

## 2013-11-09 MED ORDER — IPRATROPIUM BROMIDE 0.02 % IN SOLN
0.5000 mg | RESPIRATORY_TRACT | Status: DC
  Administered 2013-11-09 – 2013-11-11 (×10): 0.5 mg via RESPIRATORY_TRACT
  Filled 2013-11-09 (×10): qty 2.5

## 2013-11-09 MED ORDER — ENOXAPARIN SODIUM 30 MG/0.3ML ~~LOC~~ SOLN
30.0000 mg | SUBCUTANEOUS | Status: DC
  Administered 2013-11-09 – 2013-11-12 (×4): 30 mg via SUBCUTANEOUS
  Filled 2013-11-09 (×6): qty 0.3

## 2013-11-09 MED ORDER — MECLIZINE HCL 25 MG PO TABS
25.0000 mg | ORAL_TABLET | Freq: Three times a day (TID) | ORAL | Status: DC | PRN
  Filled 2013-11-09: qty 1

## 2013-11-09 NOTE — H&P (Signed)
History and Physical       Hospital Admission Note Date: 11/09/2013  Patient name: Tiffany [name] Medical record number: [id number] Date of birth: 08-21-1951 Age: 63 y.o. Gender: female  PCP: Tivis Ringer, MD    Chief Complaint:  Shortness of breath with coughing for last 10 days  HPI: Patient is a 63 year old female with history of spina bifida, chronic left lower extremity weakness, hypertension, diabetes presented to the ER with productive cough and shortness of breath, wheezing for the last 10 days. Patient reports that her symptoms started about 10 days ago with she initially used OTC Mucinex which did not help. Subsequently, last week she saw PA at her PCPs office and was started on albuterol inhaler with antibiotic (cannot recall the name of the antibiotic) which also did not improve her symptoms. After 3 days later, then patient continued to have copious amount of greenish productive phlegm, shortness of breath and wheezing, she went to her PCP on Friday 5 days ago and was placed on Levaquin which she has completed with no significant improvement in her symptoms. The patient reports that chest x-ray was done outpatient and had shown right lung pneumonia. In the ear, CT scan was done which showed no pulmonary embolism but extensive bronchiectatic changes bilaterally with widespread nodular interstitial disease, patchy infiltrates in the left upper lobe. She denied any chest pain, leg swelling or cough tenderness.    Review of Systems:  Constitutional: Denies fever, chills, diaphoresis, + poor appetite and fatigue.  HEENT: Denies photophobia, eye pain, redness, hearing loss, ear pain, congestion, sore throat, rhinorrhea, sneezing, mouth sores, trouble swallowing, neck pain, neck stiffness and tinnitus.   Respiratory:  please see history of present illness  Cardiovascular: Denies chest pain, palpitations and leg swelling.   Gastrointestinal: Denies nausea, vomiting, abdominal pain, diarrhea, constipation, blood in stool and abdominal distention.  Genitourinary: Denies dysuria, urgency, frequency, hematuria, flank pain and difficulty urinating.  Musculoskeletal: Denies myalgias, back pain, joint swelling, arthralgias and gait problem.  Skin: Denies pallor, rash and wound.  Neurological: Denies dizziness, seizures, syncope, numbness and headaches.  generalized weakness + Hematological: Denies adenopathy. Easy bruising, personal or family bleeding history  Psychiatric/Behavioral: Denies suicidal ideation, mood changes, confusion, nervousness, sleep disturbance and agitation  Past Medical History: Past Medical History  Diagnosis Date  . Diabetes mellitus   . Kidney stones   . Meningitis   . Hypertension   . GERD (gastroesophageal reflux disease)   . Hiatal hernia   . Gastritis   . IBS (irritable bowel syndrome)   . Depression   . Kidney stones   . Colon polyps   . Spina bifida occulta   . Insomnia   . Allergy   . Arthritis   . Cataract     REMOVED  . Hyperlipidemia   . Osteoporosis   . Ulcer    Past Surgical History  Procedure Laterality Date  . Back surgery    . Abdominal hysterectomy    . Tonsillectomy    . Polypectomy    . Meniscus repair    . Fundoplication    . Lithotripsy    . Elbow surgery      left  . Cataract extraction  03/2012    bilateral  . Colonoscopy      Medications: Prior to Admission medications   Medication Sig Start Date End Date Taking? Authorizing Provider  acetaminophen (TYLENOL) 500 MG tablet Take 1,000 mg by mouth every 6 (six) hours as needed. For pain  Historical Provider, MD  Calcium Carbonate 1500 MG TABS Take 1 tablet by mouth daily.    Historical Provider, MD  diazepam (VALIUM) 10 MG tablet Take 10 mg by mouth every 6 (six) hours as needed.    Historical Provider, MD  diphenhydrAMINE (BENADRYL) 25 MG tablet Take 25 mg by mouth every 6 (six) hours as  needed.    Historical Provider, MD  EPINEPHrine (EPIPEN JR) 0.15 MG/0.3ML injection Inject 0.15 mg into the muscle as needed.    Historical Provider, MD  furosemide (LASIX) 40 MG tablet Take 40 mg by mouth daily.    Historical Provider, MD  hyoscyamine (LEVSIN, ANASPAZ) 0.125 MG tablet Take 0.125 mg by mouth every 4 (four) hours as needed.    Historical Provider, MD  meclizine (ANTIVERT) 25 MG tablet Take 25 mg by mouth 3 (three) times daily as needed.    Historical Provider, MD  metFORMIN (GLUCOPHAGE) 500 MG tablet Take 500 mg by mouth 2 (two) times daily with a meal.    Historical Provider, MD  ONE TOUCH ULTRA TEST test strip  07/11/12   Historical Provider, MD  Polyethyl Glycol-Propyl Glycol (SYSTANE OP) Apply 2 drops to eye 2 (two) times daily as needed. For burning or dry eyes    Historical Provider, MD  promethazine (PHENERGAN) 25 MG tablet Take 25 mg by mouth every 6 (six) hours as needed.    Historical Provider, MD  simvastatin (ZOCOR) 80 MG tablet Take 80 mg by mouth at bedtime.    Historical Provider, MD  Vitamin D, Ergocalciferol, (DRISDOL) 50000 UNITS CAPS Take 50,000 Units by mouth every 7 (seven) days. On Sundays    Historical Provider, MD    Allergies:   Allergies  Allergen Reactions  . Ketorolac Tromethamine Anaphylaxis  . Toradol [Ketorolac Tromethamine] Anaphylaxis  . Ambien [Zolpidem Tartrate]     Nightmares and insomnia  . Aspirin     GI bleed  . Benicar [Olmesartan Medoxomil] Other (See Comments)    Swelling of throat  . Codeine Nausea And Vomiting  . Erythromycin Nausea And Vomiting  . Fenofibrate Nausea And Vomiting  . Glucosamine Forte [Nutritional Supplements] Diarrhea and Nausea Only  . Levaquin [Levofloxacin In D5w] Diarrhea and Swelling  . Lisinopril Other (See Comments)     Swelling of throat  . Percocet [Oxycodone-Acetaminophen] Nausea And Vomiting  . Reclast [Zoledronic Acid]     Ocular reaction, toxic  . Tizanidine   . Tramadol Nausea And Vomiting     Social History:  reports that she has never smoked. She has never used smokeless tobacco. She reports that she does not drink alcohol or use illicit drugs.  Family History: Family History  Problem Relation Age of Onset  . Alzheimer's disease Father   . Irritable bowel syndrome Father   . Ulcers Father   . Gout Father   . Cancer Mother     ?  . Diabetes Mother   . Hypertension Mother   . Nephrolithiasis Mother   . Colon cancer Mother   . Ovarian cancer Mother   . Coronary artery disease      grandparent  . Thyroid cancer Sister   . Nephrolithiasis Brother   . Colon cancer Maternal Grandmother     Physical Exam: Blood pressure 146/83, pulse 127, temperature 98.5 F (36.9 C), temperature source Oral, resp. rate 24, SpO2 92.00%. General: Alert, awake, oriented x3, in no acute distress. HEENT: normocephalic, atraumatic, anicteric sclera, pink conjunctiva, pupils equal and reactive to light and accomodation, oropharynx  clear Neck: supple, no masses or lymphadenopathy, no goiter, no bruits  Heart:  tachycardia, Regular rate and rhythm, without murmurs, rubs or gallops. Lungs:  coarse breath sounds bilaterally with wheezing  Abdomen: Soft, nontender, nondistended, positive bowel sounds, no masses. Extremities: No clubbing, cyanosis or edema with positive pedal pulses. Neuro: Patient has history of chronic left leg weakness due to spina bifida (wears brace) Psych: alert and oriented x 3, normal mood and affect Skin: no rashes or lesions, warm and dry   LABS on Admission:  Basic Metabolic Panel:  Recent Labs Lab 11/09/13 1434  NA 140  K 4.4  CL 98  CO2 25  GLUCOSE 149*  BUN 19  CREATININE 1.35*  CALCIUM 9.7   Liver Function Tests: No results found for this basename: AST, ALT, ALKPHOS, BILITOT, PROT, ALBUMIN,  in the last 168 hours No results found for this basename: LIPASE, AMYLASE,  in the last 168 hours No results found for this basename: AMMONIA,  in the last  168 hours CBC:  Recent Labs Lab 11/09/13 1434  WBC 9.1  HGB 13.2  HCT 39.8  MCV 91.7  PLT 254   Cardiac Enzymes: No results found for this basename: CKTOTAL, CKMB, CKMBINDEX, TROPONINI,  in the last 168 hours BNP: No components found with this basename: POCBNP,  CBG: No results found for this basename: GLUCAP,  in the last 168 hours   Radiological Exams on Admission: Ct Angio Chest Pe W/cm &/or Wo Cm  11/09/2013   CLINICAL DATA:  Dyspnea  EXAM: CT ANGIOGRAPHY CHEST WITH CONTRAST  TECHNIQUE: Multidetector CT imaging of the chest was performed using the standard protocol during bolus administration of intravenous contrast. Multiplanar CT image reconstructions and MIPs were obtained to evaluate the vascular anatomy.  CONTRAST:  132mL OMNIPAQUE IOHEXOL 350 MG/ML SOLN  COMPARISON:  Chest radiograph October 22, 2010  FINDINGS: There is no demonstrable pulmonary embolus. There is no thoracic aortic aneurysm or dissection.  There is extensive bronchiectatic change bilaterally. There is a focal pulmonary nodular opacity in the medial segment of the right middle lobe measuring 0.8 x 0.7 cm, best seen on axial slice 60 series 6. There is widespread nodular interstitial disease with multiple 1 mm nodular opacities throughout the lungs somewhat diffusely with a lower lobe predominance. There is some patchy infiltrate in the left upper lobe peripherally. There is a well-defined 3 mm nodular opacity in the superior segment left lower lobe seen on slice 74, series 6. There is a tiny calcified granuloma in the superior segment right lower lobe.  There is a focal hiatal hernia.  There is no appreciable thoracic adenopathy. There are small mediastinal lymph nodes which do not meet size criteria for pathologic significance. There are foci of coronary artery calcification. There is no pericardial effusion.  Visualized upper abdominal structures appear normal. There are no blastic or lytic bone lesions. There is  mild inhomogeneity in the right lobe of the thyroid.  Review of the MIP images confirms the above findings.  IMPRESSION: No demonstrable pulmonary embolus. There is patchy infiltrate in the left upper lobe. There is nodular interstitial disease fairly diffusely of uncertain etiology.  There is an 8 mm nodular opacity in the right middle lobe as well as a 3 mm nodular opacity in the left lower lobe. Followup of these nodular opacity should be based on Fleischner Society guidelines. If the patient is at high risk for bronchogenic carcinoma, follow-up chest CT at 3-46months is recommended. If the patient is  at low risk for bronchogenic carcinoma, follow-up chest CT at 6-12 months is recommended. This recommendation follows the consensus statement: Guidelines for Management of Small Pulmonary Nodules Detected on CT Scans: A Statement from the [place] as published in Radiology 2005; 237:395-400.  There is fairly extensive bronchiectatic change bilaterally.  There is a focal hiatal hernia.  No adenopathy. There are multiple foci of coronary artery calcification.   Electronically Signed   By: Lowella Grip M.D.   On: 11/09/2013 12:08    Assessment/Plan Principal Problem:   HCAP (healthcare-associated pneumonia) with acute bronchitis- With outpatient failure to treatment - CT scan of the chest done, showed no pulmonary embolism, BNP 22 - Will place on HCAP protocol, also obtained influenza PCR, blood cultures, urine legionella antigen, urine strep antigen - Placed on IV vancomycin and cefepime -Placed on scheduled bronchodilators, dulera, IV solumedrol, flutter valve, antitussives  Active Problems:   GERD:  on PPI    IBS - Currently stable    Tachycardia: Likely due to #1, ruled out PE    Acute renal insufficiency: Likely due to #1, dehydration  - Hold Lasix , metformin - Placed on gentle hydration   DVT prophylaxis:  Lovenox   CODE STATUS:  full CODE STATUS   Family Communication:  Admission, patients condition and plan of care including tests being ordered have been discussed with the patient , Her husband and her sister who indicates understanding and agree with the plan and Code Status   Further plan will depend as patient's clinical course evolves and further radiologic and laboratory data become available.   Time Spent on Admission: 1 hour  Daylynn Stumpp M.D. [facility] 11/09/2013, 4:41 PM Pager: [phone]  If 7PM-7AM, please contact night-coverage [email or web address] Password [email or web address]

## 2013-11-09 NOTE — ED Notes (Signed)
Pt has been treated for pneumonia for the last 10 days and symptoms not improving.  Pt was placed on oxygen, had CT scan and it was worsening pneumonia.  Pt was referred to come to the ED for admission.  Pt now has bilateral PNA.and experiencing increased sob.

## 2013-11-09 NOTE — ED Provider Notes (Signed)
CSN: [id number]     Arrival date & time 11/09/13  1407 History   First MD Initiated Contact with Patient 11/09/13 1516     Chief Complaint  Patient presents with  . Shortness of Breath    HPI Is a 63 year old female with a history of hypertension and diabetes presents complaining of cough and shortness of breath. Onset was about 10 days ago. She was initially treated for bronchitis presumptively. She was started on an antibiotic which he cannot recall and failed to improve. She saw her primary care physician a couple days later and was diagnosed with pneumonia in the right lower lobe baseline chest x-ray. At that time she was started on Levaquin and has completed a seven-day course. She is felt to improve in the interim. Her cough is productive of copious green and yellow sputum. She feels short of breath at rest, and this worsens with exertion. She has not had any chest discomfort. She has not had any leg swelling or calf tenderness.. No peripheral edema. She has not had significant nasal congestion, rhinorrhea. Her symptoms are moderate in severity. They're worsening. She's never had anything like this before. She has no history of COPD, congestive heart failure, or acute coronary syndrome. She's never had a DVT or PE.   She had a CT scan done today that showed findings consistent with multilobar pneumonia as well as several small nodular opacities and bronchiectatic changes.   Past Medical History  Diagnosis Date  . Diabetes mellitus   . Kidney stones   . Meningitis   . Hypertension   . GERD (gastroesophageal reflux disease)   . Hiatal hernia   . Gastritis   . IBS (irritable bowel syndrome)   . Depression   . Kidney stones   . Colon polyps   . Spina bifida occulta   . Insomnia   . Allergy   . Arthritis   . Cataract     REMOVED  . Hyperlipidemia   . Osteoporosis   . Ulcer    Past Surgical History  Procedure Laterality Date  . Back surgery    . Abdominal hysterectomy    .  Tonsillectomy    . Polypectomy    . Meniscus repair    . Fundoplication    . Lithotripsy    . Elbow surgery      left  . Cataract extraction  03/2012    bilateral  . Colonoscopy     Family History  Problem Relation Age of Onset  . Alzheimer's disease Father   . Irritable bowel syndrome Father   . Ulcers Father   . Gout Father   . Cancer Mother     ?  . Diabetes Mother   . Hypertension Mother   . Nephrolithiasis Mother   . Colon cancer Mother   . Ovarian cancer Mother   . Coronary artery disease      grandparent  . Thyroid cancer Sister   . Nephrolithiasis Brother   . Colon cancer Maternal Grandmother    History  Substance Use Topics  . Smoking status: Never Smoker   . Smokeless tobacco: Never Used  . Alcohol Use: No     Comment: OCC.   OB History   Grav Para Term Preterm Abortions TAB SAB Ect Mult Living                 Review of Systems  Constitutional: Positive for fatigue. Negative for fever, chills and diaphoresis.  HENT: Negative for congestion and  rhinorrhea.   Respiratory: Positive for cough and shortness of breath. Negative for wheezing.   Cardiovascular: Negative for chest pain and leg swelling.  Gastrointestinal: Negative for nausea, vomiting, abdominal pain and diarrhea.  Genitourinary: Negative for dysuria, urgency, frequency, flank pain, vaginal bleeding, vaginal discharge and difficulty urinating.  Musculoskeletal: Negative for neck pain and neck stiffness.  Skin: Negative for rash.  Neurological: Negative for weakness, numbness and headaches.  All other systems reviewed and are negative.      Allergies  Ketorolac tromethamine; Toradol; Ambien; Aspirin; Benicar; Codeine; Erythromycin; Fenofibrate; Glucosamine forte; Levaquin; Lisinopril; Percocet; Reclast; Tizanidine; and Tramadol  Home Medications   Current Outpatient Rx  Name  Route  Sig  Dispense  Refill  . acetaminophen (TYLENOL) 500 MG tablet   Oral   Take 1,000 mg by mouth every 6  (six) hours as needed. For pain         . Calcium Carbonate 1500 MG TABS   Oral   Take 1 tablet by mouth daily.         . diazepam (VALIUM) 10 MG tablet   Oral   Take 10 mg by mouth every 6 (six) hours as needed.         . diphenhydrAMINE (BENADRYL) 25 MG tablet   Oral   Take 25 mg by mouth every 6 (six) hours as needed.         Marland Kitchen EPINEPHrine (EPIPEN JR) 0.15 MG/0.3ML injection   Intramuscular   Inject 0.15 mg into the muscle as needed.         . furosemide (LASIX) 40 MG tablet   Oral   Take 40 mg by mouth daily.         . hyoscyamine (LEVSIN, ANASPAZ) 0.125 MG tablet   Oral   Take 0.125 mg by mouth every 4 (four) hours as needed.         . meclizine (ANTIVERT) 25 MG tablet   Oral   Take 25 mg by mouth 3 (three) times daily as needed.         . metFORMIN (GLUCOPHAGE) 500 MG tablet   Oral   Take 500 mg by mouth 2 (two) times daily with a meal.         . ONE TOUCH ULTRA TEST test strip               . Polyethyl Glycol-Propyl Glycol (SYSTANE OP)   Ophthalmic   Apply 2 drops to eye 2 (two) times daily as needed. For burning or dry eyes         . promethazine (PHENERGAN) 25 MG tablet   Oral   Take 25 mg by mouth every 6 (six) hours as needed.         . simvastatin (ZOCOR) 80 MG tablet   Oral   Take 80 mg by mouth at bedtime.         . Vitamin D, Ergocalciferol, (DRISDOL) 50000 UNITS CAPS   Oral   Take 50,000 Units by mouth every 7 (seven) days. On Sundays          BP 146/83  Pulse 127  Temp(Src) 98.5 F (36.9 C) (Oral)  Resp 24  SpO2 92% Physical Exam GENERAL: ill appearing, non-toxic, no acute distress, but visibly dsypneic HEENT: atraumatic; normocephalic.  PERRL.  EOMI.  Sclera normal.  Mucus membranes moist.  Oropharynx clear.   NECK: supple.  No JVD.  Normal ROM. CARDIOVASCULAR: Tachycardia to 120, no murmur, gallop, or rubs.  Intact,  strong, and bilaterally equal distal pulses.  Skin warm and dry.  No peripheral  edema. PULMONARY: tachypnea to 25, visibly dyspneic, but no accessory muscle use, bilateral rhonci, left greater than right, sats 92% on RA ABDOMEN: soft, non-distended, non-tender.  No pulsatile mass. GU: deferred MUSCULOSKELETAL: atraumatic; no edema NEURO: alert and oriented X 4.  CN 2-12 intact.  5/5 strength in bilateral upper extremities and lower extremities, bilaterally symmetric.  Normal sensation to light touch in bilateral upper and lower extremities, symmetric.  Normal tone.  Normal finger to nose and heel to shin test.  Negative Romberg.  Normal gait with no ataxia.  2+ bilateral patellar reflexes, symmetric.  No clonus.  Visual fields intact to confrontation. SKIN: warm and dry with no noted rash, abscess, or cellulitis PSYCH: normal mood and affect; normal memory to recent events  ED Course  Procedures (including critical care time) Labs Review Labs Reviewed  BASIC METABOLIC PANEL - Abnormal; Notable for the following:    Glucose, Bld 149 (*)    Creatinine, Ser 1.35 (*)    GFR calc non Af Amer 41 (*)    GFR calc Af Amer 48 (*)    All other components within normal limits  CULTURE, EXPECTORATED SPUTUM-ASSESSMENT  CULTURE, BLOOD (ROUTINE X 2)  CULTURE, BLOOD (ROUTINE X 2)  CBC  PRO B NATRIURETIC PEPTIDE  I-STAT TROPOININ, ED   Imaging Review Ct Angio Chest Pe W/cm &/or Wo Cm  11/09/2013   CLINICAL DATA:  Dyspnea  EXAM: CT ANGIOGRAPHY CHEST WITH CONTRAST  TECHNIQUE: Multidetector CT imaging of the chest was performed using the standard protocol during bolus administration of intravenous contrast. Multiplanar CT image reconstructions and MIPs were obtained to evaluate the vascular anatomy.  CONTRAST:  166mL OMNIPAQUE IOHEXOL 350 MG/ML SOLN  COMPARISON:  Chest radiograph October 22, 2010  FINDINGS: There is no demonstrable pulmonary embolus. There is no thoracic aortic aneurysm or dissection.  There is extensive bronchiectatic change bilaterally. There is a focal pulmonary  nodular opacity in the medial segment of the right middle lobe measuring 0.8 x 0.7 cm, best seen on axial slice 60 series 6. There is widespread nodular interstitial disease with multiple 1 mm nodular opacities throughout the lungs somewhat diffusely with a lower lobe predominance. There is some patchy infiltrate in the left upper lobe peripherally. There is a well-defined 3 mm nodular opacity in the superior segment left lower lobe seen on slice 74, series 6. There is a tiny calcified granuloma in the superior segment right lower lobe.  There is a focal hiatal hernia.  There is no appreciable thoracic adenopathy. There are small mediastinal lymph nodes which do not meet size criteria for pathologic significance. There are foci of coronary artery calcification. There is no pericardial effusion.  Visualized upper abdominal structures appear normal. There are no blastic or lytic bone lesions. There is mild inhomogeneity in the right lobe of the thyroid.  Review of the MIP images confirms the above findings.  IMPRESSION: No demonstrable pulmonary embolus. There is patchy infiltrate in the left upper lobe. There is nodular interstitial disease fairly diffusely of uncertain etiology.  There is an 8 mm nodular opacity in the right middle lobe as well as a 3 mm nodular opacity in the left lower lobe. Followup of these nodular opacity should be based on Fleischner Society guidelines. If the patient is at high risk for bronchogenic carcinoma, follow-up chest CT at 3-68months is recommended. If the patient is at low risk for bronchogenic carcinoma,  follow-up chest CT at 6-12 months is recommended. This recommendation follows the consensus statement: Guidelines for Management of Small Pulmonary Nodules Detected on CT Scans: A Statement from the [place] as published in Radiology 2005; 237:395-400.  There is fairly extensive bronchiectatic change bilaterally.  There is a focal hiatal hernia.  No adenopathy. There are  multiple foci of coronary artery calcification.   Electronically Signed   By: Lowella Grip M.D.   On: 11/09/2013 12:08     EKG Interpretation  Date: 11/09/2013  Rate: 122  Rhythm: sinus tachycardia  QRS Axis: normal  Intervals: normal  ST/T Wave abnormalities: nonspecific T wave changes which are new compared to prior  Conduction Disutrbances:none  Narrative Interpretation: sinus tachycardia with non-specific T wave abnormality, new from prior, no ST elevations or depressions         MDM    63 year old female history of diabetes and hypertension presents with failed outpatient treatment of community-acquired pneumonia. She's been treated with a full course of Levaquin with no improvement. She has a cough productive of green sputum. She is tachycardic to 120, mild tachypnea, sats 90% on room air. Normotensive 2 and mildly hypertensive. She is visibly dyspneic, however no accessory muscle use. She has diffuse rhonchi to auscultation. Remainder of exam is unremarkable.  CT from today shows multi-focal pneumonia, several pulmonary nodules, and bronchiectatic changes.  Labs demonstrate no leukocytosis, mild acute kidney injury with a creatinine of 1.3, otherwise unremarkable CBC and metabolic panel. I ordered blood and sputum cultures. Will treat with vancomycin and Zosyn for failed outpatient therapy of community-acquired pneumonia. She will need followup for the pulmonary nodules incidentally found on CT.  Admitted to hospitalist for further management   Final diagnoses:  Community acquired bacterial pneumonia  Acute kidney injury  Pulmonary nodules      Wendall Papa, MD 11/09/13 [phone]

## 2013-11-09 NOTE — Progress Notes (Signed)
11/09/2013 patient came from the emergency room to [place] at [place]. She is alert, oriented and ambulatory. It was reported to me that patient gets shortness of breath on exertion. Patient wears a brace on the left leg. Patient skin is fine. She was placed on telemetry and on droplet precaution [facility].

## 2013-11-09 NOTE — Progress Notes (Signed)
ANTIBIOTIC CONSULT NOTE - INITIAL  Pharmacy Consult for cefepime + vancomycin Indication: pneumonia  Allergies  Allergen Reactions  . Ketorolac Tromethamine Anaphylaxis  . Toradol [Ketorolac Tromethamine] Anaphylaxis  . Ambien [Zolpidem Tartrate]     Nightmares and insomnia  . Aspirin     GI bleed  . Benicar [Olmesartan Medoxomil] Other (See Comments)    Swelling of throat  . Codeine Nausea And Vomiting  . Erythromycin Nausea And Vomiting  . Fenofibrate Nausea And Vomiting  . Glucosamine Forte [Nutritional Supplements] Diarrhea and Nausea Only  . Levaquin [Levofloxacin In D5w] Diarrhea and Swelling  . Lisinopril Other (See Comments)     Swelling of throat  . Percocet [Oxycodone-Acetaminophen] Nausea And Vomiting  . Reclast [Zoledronic Acid]     Ocular reaction, toxic  . Tizanidine   . Tramadol Nausea And Vomiting    Patient Measurements:   Adjusted Body Weight:   Vital Signs: Temp: 98.5 F (36.9 C) (03/17 1428) Temp src: Oral (03/17 1428) BP: 121/71 mmHg (03/17 1630) Pulse Rate: 93 (03/17 1630) Intake/Output from previous day:   Intake/Output from this shift:    Labs:  Recent Labs  11/09/13 1434  WBC 9.1  HGB 13.2  PLT 254  CREATININE 1.35*   The CrCl is unknown because both a height and weight (above a minimum accepted value) are required for this calculation. No results found for this basename: VANCOTROUGH, VANCOPEAK, VANCORANDOM, GENTTROUGH, GENTPEAK, GENTRANDOM, TOBRATROUGH, TOBRAPEAK, TOBRARND, AMIKACINPEAK, AMIKACINTROU, AMIKACIN,  in the last 72 hours   Microbiology: No results found for this or any previous visit (from the past 720 hour(s)).  Medical History: Past Medical History  Diagnosis Date  . Diabetes mellitus   . Kidney stones   . Meningitis   . Hypertension   . GERD (gastroesophageal reflux disease)   . Hiatal hernia   . Gastritis   . IBS (irritable bowel syndrome)   . Depression   . Kidney stones   . Colon polyps   . Spina  bifida occulta   . Insomnia   . Allergy   . Arthritis   . Cataract     REMOVED  . Hyperlipidemia   . Osteoporosis   . Ulcer     Medications:  Anti-infectives   Start     Dose/Rate Route Frequency Ordered Stop   11/10/13 0800  vancomycin (VANCOCIN) IVPB 750 mg/150 ml premix     750 mg 150 mL/hr over 60 Minutes Intravenous Every 12 hours 11/09/13 1707     11/09/13 1545  vancomycin (VANCOCIN) 1,500 mg in sodium chloride 0.9 % 500 mL IVPB     1,500 mg 250 mL/hr over 120 Minutes Intravenous  Once 11/09/13 1539     11/09/13 1545  piperacillin-tazobactam (ZOSYN) IVPB 3.375 g     3.375 g 100 mL/hr over 30 Minutes Intravenous  Once 11/09/13 1539       Assessment: 78 yof presented to the ED with SOB and coughing x 10 days. She was started on an antibiotic PTA without improvement. Pt is afebrile and WBC is WNL. Scr is slightly elevated 1.35. MD has also ordered cefepime. Pt ordered first doses of vancomycin + zosyn in the ED.   Goal of Therapy:  Vancomycin trough level 15-20 mcg/ml  Plan:  1. Vancomycin 750mg  IV Q12H 2. F/u renal fxn, C&S, clinical status and trough at [facility], Rande Lawman 11/09/2013,5:07 PM

## 2013-11-09 NOTE — ED Provider Notes (Signed)
Complains of cough and shortness of breath for approximately 2.5 weeks. Patient has been treated with Levaquin, without improvement. Denies fever. Denies vomiting. No other associated symptoms. On exam speaks in paragraphs. No respiratory distress. Patient felt to failed outpatient therapy for pneumonia.  Orlie Dakin, MD 11/09/13 1550

## 2013-11-10 DIAGNOSIS — R918 Other nonspecific abnormal finding of lung field: Secondary | ICD-10-CM

## 2013-11-10 DIAGNOSIS — J479 Bronchiectasis, uncomplicated: Secondary | ICD-10-CM

## 2013-11-10 DIAGNOSIS — J9601 Acute respiratory failure with hypoxia: Secondary | ICD-10-CM | POA: Diagnosis present

## 2013-11-10 DIAGNOSIS — E119 Type 2 diabetes mellitus without complications: Secondary | ICD-10-CM | POA: Diagnosis present

## 2013-11-10 LAB — INFLUENZA PANEL BY PCR (TYPE A & B)
H1N1 flu by pcr: NOT DETECTED
Influenza A By PCR: NEGATIVE
Influenza B By PCR: NEGATIVE

## 2013-11-10 LAB — CBC
HCT: 36.5 % (ref 36.0–46.0)
Hemoglobin: 12.1 g/dL (ref 12.0–15.0)
MCH: 30.7 pg (ref 26.0–34.0)
MCHC: 33.2 g/dL (ref 30.0–36.0)
MCV: 92.6 fL (ref 78.0–100.0)
PLATELETS: 218 10*3/uL (ref 150–400)
RBC: 3.94 MIL/uL (ref 3.87–5.11)
RDW: 13.6 % (ref 11.5–15.5)
WBC: 8.2 10*3/uL (ref 4.0–10.5)

## 2013-11-10 LAB — EXPECTORATED SPUTUM ASSESSMENT W REFEX TO RESP CULTURE: SPECIAL REQUESTS: NORMAL

## 2013-11-10 LAB — BASIC METABOLIC PANEL WITH GFR
BUN: 23 mg/dL (ref 6–23)
CO2: 25 meq/L (ref 19–32)
Calcium: 9.4 mg/dL (ref 8.4–10.5)
Chloride: 104 meq/L (ref 96–112)
Creatinine, Ser: 1.47 mg/dL — ABNORMAL HIGH (ref 0.50–1.10)
GFR calc Af Amer: 43 mL/min — ABNORMAL LOW
GFR calc non Af Amer: 37 mL/min — ABNORMAL LOW
Glucose, Bld: 181 mg/dL — ABNORMAL HIGH (ref 70–99)
Potassium: 4.5 meq/L (ref 3.7–5.3)
Sodium: 142 meq/L (ref 137–147)

## 2013-11-10 LAB — URINALYSIS, ROUTINE W REFLEX MICROSCOPIC
Bilirubin Urine: NEGATIVE
GLUCOSE, UA: NEGATIVE mg/dL
HGB URINE DIPSTICK: NEGATIVE
Ketones, ur: NEGATIVE mg/dL
Leukocytes, UA: NEGATIVE
Nitrite: NEGATIVE
Protein, ur: NEGATIVE mg/dL
SPECIFIC GRAVITY, URINE: 1.024 (ref 1.005–1.030)
UROBILINOGEN UA: 0.2 mg/dL (ref 0.0–1.0)
pH: 5.5 (ref 5.0–8.0)

## 2013-11-10 LAB — GLUCOSE, CAPILLARY
Glucose-Capillary: 249 mg/dL — ABNORMAL HIGH (ref 70–99)
Glucose-Capillary: 164 mg/dL — ABNORMAL HIGH (ref 70–99)

## 2013-11-10 LAB — HEMOGLOBIN A1C
HEMOGLOBIN A1C: 6 % — AB (ref <5.7)
Mean Plasma Glucose: 126 mg/dL — ABNORMAL HIGH (ref <117)

## 2013-11-10 LAB — STREP PNEUMONIAE URINARY ANTIGEN: Strep Pneumo Urinary Antigen: NEGATIVE

## 2013-11-10 LAB — LEGIONELLA ANTIGEN, URINE: Legionella Antigen, Urine: NEGATIVE

## 2013-11-10 MED ORDER — VANCOMYCIN HCL IN DEXTROSE 1-5 GM/200ML-% IV SOLN
1000.0000 mg | INTRAVENOUS | Status: DC
  Administered 2013-11-11 – 2013-11-12 (×2): 1000 mg via INTRAVENOUS
  Filled 2013-11-10 (×4): qty 200

## 2013-11-10 MED ORDER — INSULIN ASPART 100 UNIT/ML ~~LOC~~ SOLN
0.0000 [IU] | Freq: Three times a day (TID) | SUBCUTANEOUS | Status: DC
  Administered 2013-11-10 – 2013-11-11 (×2): 3 [IU] via SUBCUTANEOUS
  Administered 2013-11-11 (×2): 2 [IU] via SUBCUTANEOUS
  Administered 2013-11-12: 3 [IU] via SUBCUTANEOUS
  Administered 2013-11-12 (×2): 2 [IU] via SUBCUTANEOUS
  Administered 2013-11-13: 1 [IU] via SUBCUTANEOUS

## 2013-11-10 MED ORDER — INSULIN ASPART 100 UNIT/ML ~~LOC~~ SOLN
0.0000 [IU] | Freq: Every day | SUBCUTANEOUS | Status: DC
  Administered 2013-11-11: 2 [IU] via SUBCUTANEOUS

## 2013-11-10 MED ORDER — DEXTROSE 5 % IV SOLN
1.0000 g | Freq: Two times a day (BID) | INTRAVENOUS | Status: DC
  Administered 2013-11-10 – 2013-11-12 (×4): 1 g via INTRAVENOUS
  Filled 2013-11-10 (×7): qty 1

## 2013-11-10 MED ORDER — PNEUMOCOCCAL VAC POLYVALENT 25 MCG/0.5ML IJ INJ
0.5000 mL | INJECTION | INTRAMUSCULAR | Status: AC
  Administered 2013-11-11: 0.5 mL via INTRAMUSCULAR
  Filled 2013-11-10 (×2): qty 0.5

## 2013-11-10 NOTE — Progress Notes (Signed)
[facility]  Tiffany [name] QAS:[id number] DOB: 11/12/1950 DOA: 11/09/2013 PCP: Tivis Ringer, MD  Brief narrative: 63 year old female with history of spina bifida, chronic left lower extremity weakness, hypertension, diabetes presented to the ER with productive cough and shortness of breath, wheezing for the last 10 days. Patient reports that her symptoms started about 10 days ago with she initially used OTC Mucinex which did not help. Subsequently, last week she saw PA at her PCPs office and was started on albuterol inhaler with antibiotic (cannot recall the name of the antibiotic) which also did not improve her symptoms. After 3 days later, then patient continued to have copious amount of greenish productive phlegm, shortness of breath and wheezing, she went to her PCP on Friday 5 days ago and was placed on Levaquin which she has completed with no significant improvement in her symptoms. The patient reports that chest x-ray was done outpatient and had shown right lung pneumonia. In the ear, CT scan was done which showed no pulmonary embolism but extensive bronchiectatic changes bilaterally with widespread nodular interstitial disease, patchy infiltrates in the left upper lobe. She denied any chest pain, leg swelling or cough tenderness.   Assessment/Plan: Active Problems:  Acute respiratory failure with hypoxia:   A) HCAP (healthcare-associated pneumonia)   B) Bronchiectasis -CXR unrevealing but CTA chest with bilateral bronchiectasis and scattered tiny nodules -agree with treat as HCAP -cont supportive care (O2, nebs, steroids) -will need OP FU with pulmonologist since this is new dx-begin IS/flutter valve    Tachycardia -2/2 hypoxia and DH -resolved    Acute renal insufficiency -baseline unknown -improving with hydration    Pulmonary nodules -per radiologist: recommend FU CT- non smoker so consider at 6 months -see above re: OP  pulmonologist referral    GERD/ IBS   DVT prophylaxis: Lovenox Code Status: Full Family Communication: No family at bedside Disposition Plan/Expected LOS: Transfer to floor   Consultants: None  Procedures: None  Antibiotics: Cefepime 3/17 >>> Zosyn 3/17 >>> Vancomycin 3/17 >>>  HPI/Subjective: Alert. Still coughing with deep inspiration- some pleuritc chest discomfort  Objective: Blood pressure 106/61, pulse 86, temperature 97.9 F (36.6 C), temperature source Oral, resp. rate 25, height 5\' 3"  (1.6 m), weight 166 lb (75.297 kg), SpO2 95.00%.  Intake/Output Summary (Last 24 hours) at 11/10/13 0908 Last data filed at 11/10/13 0600  Gross per 24 hour  Intake 866.25 ml  Output    800 ml  Net  66.25 ml     Exam: General: No acute respiratory distress-pale Lungs: Coarse to auscultation bilaterally without wheezes or crackles, 2L- note paroxsysmal cough with deep inspiration Cardiovascular: Regular rate and rhythm without murmur gallop or rub normal S1 and S2, no peripheral edema or JVD Abdomen: Nontender, nondistended, soft, bowel sounds positive, no rebound, no ascites, no appreciable mass Musculoskeletal: No significant cyanosis, clubbing of bilateral lower extremities Neurological: Alert and oriented x 3, moves all extremities x 4 without focal neurological deficits, CN 2-12 intact  Scheduled Meds:  Scheduled Meds: . atorvastatin  40 mg Oral q1800  . benzonatate  100 mg Oral TID  . calcium carbonate  1 tablet Oral Q breakfast  . ceFEPime (MAXIPIME) IV  1 g Intravenous 3 times per day  . enoxaparin (LOVENOX) injection  30 mg Subcutaneous Q24H  . levalbuterol  0.63 mg Nebulization Q4H   And  . ipratropium  0.5 mg Nebulization Q4H  . methylPREDNISolone (SOLU-MEDROL) injection  60 mg  Intravenous Q6H  . mometasone-formoterol  2 puff Inhalation BID  . pantoprazole  40 mg Oral Q0600  . vancomycin  750 mg Intravenous Q12H   Continuous Infusions: . sodium chloride 75  mL/hr at 11/09/13 1947    Data Reviewed: Basic Metabolic Panel:  Recent Labs Lab 11/09/13 1434 11/10/13 0258  NA 140 142  K 4.4 4.5  CL 98 104  CO2 25 25  GLUCOSE 149* 181*  BUN 19 23  CREATININE 1.35* 1.47*  CALCIUM 9.7 9.4   Liver Function Tests: No results found for this basename: AST, ALT, ALKPHOS, BILITOT, PROT, ALBUMIN,  in the last 168 hours No results found for this basename: LIPASE, AMYLASE,  in the last 168 hours No results found for this basename: AMMONIA,  in the last 168 hours CBC:  Recent Labs Lab 11/09/13 1434 11/10/13 0258  WBC 9.1 8.2  HGB 13.2 12.1  HCT 39.8 36.5  MCV 91.7 92.6  PLT 254 218   Cardiac Enzymes: No results found for this basename: CKTOTAL, CKMB, CKMBINDEX, TROPONINI,  in the last 168 hours BNP (last 3 results)  Recent Labs  11/09/13 1434  PROBNP 22.1   CBG: No results found for this basename: GLUCAP,  in the last 168 hours  Recent Results (from the past 240 hour(s))  CULTURE, BLOOD (ROUTINE X 2)     Status: None   Collection Time    11/09/13  5:15 PM      Result Value Ref Range Status   Specimen Description BLOOD LEFT ARM   Final   Special Requests BOTTLES DRAWN AEROBIC AND ANAEROBIC 5CC   Final   Culture  Setup Time     Final   Value: 11/09/2013 22:38     Performed at [facility]   Culture     Final   Value:        BLOOD CULTURE RECEIVED NO GROWTH TO DATE CULTURE WILL BE HELD FOR 5 DAYS BEFORE ISSUING A FINAL NEGATIVE REPORT     Performed at [facility]   Report Status PENDING   Incomplete  CULTURE, BLOOD (ROUTINE X 2)     Status: None   Collection Time    11/09/13  5:25 PM      Result Value Ref Range Status   Specimen Description BLOOD LEFT HAND   Final   Special Requests BOTTLES DRAWN AEROBIC AND ANAEROBIC 5CC   Final   Culture  Setup Time     Final   Value: 11/09/2013 22:38     Performed at [facility]   Culture     Final   Value:        BLOOD CULTURE RECEIVED NO GROWTH TO DATE  CULTURE WILL BE HELD FOR 5 DAYS BEFORE ISSUING A FINAL NEGATIVE REPORT     Performed at [facility]   Report Status PENDING   Incomplete  MRSA PCR SCREENING     Status: None   Collection Time    11/09/13  7:09 PM      Result Value Ref Range Status   MRSA by PCR NEGATIVE  NEGATIVE Final   Comment:            The GeneXpert MRSA Assay (FDA     approved for NASAL specimens     only), is one component of a     comprehensive MRSA colonization     surveillance program. It is not     intended to diagnose MRSA  infection nor to guide or     monitor treatment for     MRSA infections.  CULTURE, EXPECTORATED SPUTUM-ASSESSMENT     Status: None   Collection Time    11/10/13  1:42 AM      Result Value Ref Range Status   Specimen Description SPUTUM   Final   Special Requests Normal   Final   Sputum evaluation     Final   Value: THIS SPECIMEN IS ACCEPTABLE. RESPIRATORY CULTURE REPORT TO FOLLOW.   Report Status 11/10/2013 FINAL   Final     Studies:  Recent x-ray studies have been reviewed in detail by the Attending Physician  Time spent :     Erin Hearing, [place] [facility]  [phone] Pager [phone]  **If unable to reach the above provider after paging please contact the [place] @ [phone]  On-Call/Text Page:      [email or web address]      password [email or web address]  If 7PM-7AM, please contact night-coverage [email or web address] Password [email or web address] 11/10/2013, 9:08 AM   LOS: 1 day

## 2013-11-10 NOTE — Evaluation (Signed)
Clinical/Bedside Swallow Evaluation Patient Details  Name: MATISHA TERMINE MRN: [id number] Date of Birth: 1950-12-10  Today's Date: 11/10/2013 Time: 0827-0838 SLP Time Calculation (min): 11 min  Past Medical History:  Past Medical History  Diagnosis Date  . Diabetes mellitus   . Kidney stones   . Meningitis   . Hypertension   . GERD (gastroesophageal reflux disease)   . Hiatal hernia   . Gastritis   . IBS (irritable bowel syndrome)   . Depression   . Kidney stones   . Colon polyps   . Spina bifida occulta   . Insomnia   . Allergy   . Arthritis   . Cataract     REMOVED  . Hyperlipidemia   . Osteoporosis   . Ulcer    Past Surgical History:  Past Surgical History  Procedure Laterality Date  . Back surgery    . Abdominal hysterectomy    . Tonsillectomy    . Polypectomy    . Meniscus repair    . Fundoplication    . Lithotripsy    . Elbow surgery      left  . Cataract extraction  03/2012    bilateral  . Colonoscopy     HPI:  Patient is a 63 year old female with history of spina bifida, chronic left lower extremity weakness, hypertension, diabetes presented to the ER with productive cough and shortness of breath, wheezing for the last 10 days. Patient reports that her symptoms started about 10 days ago with she initially used OTC Mucinex which did not help. Subsequently, last week she saw PA at her PCPs office and was started on albuterol inhaler with antibiotic (cannot recall the name of the antibiotic) which also did not improve her symptoms. After 3 days later, then patient continued to have copious amount of greenish productive phlegm, shortness of breath and wheezing, she went to her PCP on Friday 5 days ago and was placed on Levaquin which she has completed with no significant improvement in her symptoms. The patient reports that chest x-ray was done outpatient and had shown right lung pneumonia. In the ER, CT scan was done which showed no pulmonary embolism but  extensive bronchiectatic changes bilaterally with widespread nodular interstitial disease, patchy infiltrates in the left upper lobe. Esophagram in 2009 shows Achalasia-like appearance of the distal esophagus with impaired passage of 13 mm barium tablet. 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 Pseudo-achalasia felt less likely. Severe esophageal dysmotility, with symptomatic corkscrew esophagus. MBS in 2009 WNL except for esophageal function. Pt underwent dilatation shortly after.   Assessment / Plan / Recommendation Clinical Impression  Pt demonstrates adequate swallow function with no evidence of aspiration. She does confirm history of esophageal dysphagia, but reports no acute symptoms since 2009 when she was last dilated. Pt may continue current diet, no SLP f/u needed.     Aspiration Risk  Mild    Diet Recommendation Regular;Thin liquid   Liquid Administration via: Cup;Straw Medication Administration: Whole meds with liquid Supervision: Patient able to self feed Postural Changes and/or Swallow Maneuvers: Seated upright 90 degrees;Upright 30-60 min after meal    Other  Recommendations Oral Care Recommendations: Oral care BID   Follow Up Recommendations  None    Frequency and Duration        Pertinent Vitals/Pain NA    SLP Swallow Goals     Swallow Study Prior Functional Status       General HPI: Patient is a 63 year old female with history  of spina bifida, chronic left lower extremity weakness, hypertension, diabetes presented to the ER with productive cough and shortness of breath, wheezing for the last 10 days. Patient reports that her symptoms started about 10 days ago with she initially used OTC Mucinex which did not help. Subsequently, last week she saw PA at her PCPs office and was started on albuterol inhaler with antibiotic (cannot recall the name of the antibiotic) which also did not improve her symptoms. After 3 days later, then patient  continued to have copious amount of greenish productive phlegm, shortness of breath and wheezing, she went to her PCP on Friday 5 days ago and was placed on Levaquin which she has completed with no significant improvement in her symptoms. The patient reports that chest x-ray was done outpatient and had shown right lung pneumonia. In the ER, CT scan was done which showed no pulmonary embolism but extensive bronchiectatic changes bilaterally with widespread nodular interstitial disease, patchy infiltrates in the left upper lobe. Esophagram in 2009 shows Achalasia-like appearance of the distal esophagus with impaired passage of 13 mm barium tablet. 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 Pseudo-achalasia felt less likely. Severe esophageal dysmotility, with symptomatic corkscrew esophagus. MBS in 2009 WNL except for esophageal function. Pt underwent dilatation shortly after. Type of Study: Bedside swallow evaluation Diet Prior to this Study: Regular;Thin liquids Temperature Spikes Noted: No Respiratory Status: Nasal cannula History of Recent Intubation: No Behavior/Cognition: Alert;Cooperative;Pleasant mood Oral Cavity - Dentition: Adequate natural dentition Self-Feeding Abilities: Able to feed self Patient Positioning: Upright in bed Baseline Vocal Quality: Clear Volitional Cough: Strong Volitional Swallow: Able to elicit    Oral/Motor/Sensory Function Overall Oral Motor/Sensory Function: Appears within functional limits for tasks assessed   Ice Chips     Thin Liquid Thin Liquid: Within functional limits Presentation: Cup;Straw;Self Fed    Nectar Thick Nectar Thick Liquid: Not tested   Honey Thick Honey Thick Liquid: Not tested   Puree Puree: Not tested   Solid   GO    Solid: Within functional limits      [facility], MA CCC-SLP [phone]  Lynann Beaver 11/10/2013,8:41 AM

## 2013-11-10 NOTE — Progress Notes (Signed)
She was seen, examined and discussed with my nurse practitioner. Agree with above note. Sugars elevated and patient does have history diabetes on metformin which has been held previously. Checking A1c and adding sliding scale, especially in the setting of elevated blood sugars with steroids.  Will need outpatient followup CT scan for pulmonary nodules given low risk. Stable. Transfer to floor.

## 2013-11-10 NOTE — ED Provider Notes (Signed)
I have personally seen and examined the patient.  I have discussed the plan of care with the resident.  I have reviewed the documentation on PMH/FH/Soc. History.  I have reviewed the documentation of the resident and agree.  Orlie Dakin, MD 11/10/13 [phone]

## 2013-11-10 NOTE — Progress Notes (Signed)
ANTIBIOTIC CONSULT NOTE - FOLLOW UP  Pharmacy Consult for vancomycin and cefepime Indication: HCAP  Allergies  Allergen Reactions  . Ambien [Zolpidem Tartrate] Other (See Comments)    Nightmares and insomnia  . Aspirin Other (See Comments)    GI bleed  . Benicar [Olmesartan Medoxomil] Shortness Of Breath, Swelling and Other (See Comments)    Swelling of throat  . Ketorolac Tromethamine Anaphylaxis  . Latex Shortness Of Breath and Itching  . Lisinopril Shortness Of Breath, Swelling and Other (See Comments)     Swelling of throat  . Other Anaphylaxis    Peaches and mangoes, tree nuts, tomatoes, shellfish  . Reclast [Zoledronic Acid] Other (See Comments)    Ocular reaction, toxic  . Toradol [Ketorolac Tromethamine] Anaphylaxis  . Codeine Nausea And Vomiting  . Erythromycin Nausea And Vomiting  . Fenofibrate Nausea And Vomiting  . Glucosamine Forte [Nutritional Supplements] Diarrhea and Nausea Only  . Levaquin [Levofloxacin In D5w] Diarrhea and Swelling  . Percocet [Oxycodone-Acetaminophen] Nausea And Vomiting  . Tramadol Nausea And Vomiting  . Tizanidine Other (See Comments)    unknown    Patient Measurements: Height: 5\' 3"  (160 cm) Weight: 166 lb (75.297 kg) IBW/kg (Calculated) : 52.4  Vital Signs: Temp: 97.9 F (36.6 C) (03/18 0808) Temp src: Oral (03/18 0808) BP: 106/61 mmHg (03/18 0808) Pulse Rate: 86 (03/18 0808) Intake/Output from previous day: 03/17 0701 - 03/18 0700 In: 866.3 [I.V.:766.3; IV Piggyback:100] Out: 800 [Urine:800] Intake/Output from this shift:    Labs:  Recent Labs  11/09/13 1434 11/10/13 0258  WBC 9.1 8.2  HGB 13.2 12.1  PLT 254 218  CREATININE 1.35* 1.47*   Estimated Creatinine Clearance: 39.1 ml/min (by C-G formula based on Cr of 1.47). No results found for this basename: VANCOTROUGH, VANCOPEAK, VANCORANDOM, [place], [place], GENTRANDOM, TOBRATROUGH, TOBRAPEAK, TOBRARND, AMIKACINPEAK, AMIKACINTROU, AMIKACIN,  in the last 72  hours   Microbiology: Recent Results (from the past 720 hour(s))  CULTURE, BLOOD (ROUTINE X 2)     Status: None   Collection Time    11/09/13  5:15 PM      Result Value Ref Range Status   Specimen Description BLOOD LEFT ARM   Final   Special Requests BOTTLES DRAWN AEROBIC AND ANAEROBIC 5CC   Final   Culture  Setup Time     Final   Value: 11/09/2013 22:38     Performed at [facility]   Culture     Final   Value:        BLOOD CULTURE RECEIVED NO GROWTH TO DATE CULTURE WILL BE HELD FOR 5 DAYS BEFORE ISSUING A FINAL NEGATIVE REPORT     Performed at [facility]   Report Status PENDING   Incomplete  CULTURE, BLOOD (ROUTINE X 2)     Status: None   Collection Time    11/09/13  5:25 PM      Result Value Ref Range Status   Specimen Description BLOOD LEFT HAND   Final   Special Requests BOTTLES DRAWN AEROBIC AND ANAEROBIC 5CC   Final   Culture  Setup Time     Final   Value: 11/09/2013 22:38     Performed at [facility]   Culture     Final   Value:        BLOOD CULTURE RECEIVED NO GROWTH TO DATE CULTURE WILL BE HELD FOR 5 DAYS BEFORE ISSUING A FINAL NEGATIVE REPORT     Performed at [facility]   Report Status  PENDING   Incomplete  MRSA PCR SCREENING     Status: None   Collection Time    11/09/13  7:09 PM      Result Value Ref Range Status   MRSA by PCR NEGATIVE  NEGATIVE Final   Comment:            The GeneXpert MRSA Assay (FDA     approved for NASAL specimens     only), is one component of a     comprehensive MRSA colonization     surveillance program. It is not     intended to diagnose MRSA     infection nor to guide or     monitor treatment for     MRSA infections.  CULTURE, EXPECTORATED SPUTUM-ASSESSMENT     Status: None   Collection Time    11/10/13  1:42 AM      Result Value Ref Range Status   Specimen Description SPUTUM   Final   Special Requests Normal   Final   Sputum evaluation     Final   Value: THIS SPECIMEN IS ACCEPTABLE.  RESPIRATORY CULTURE REPORT TO FOLLOW.   Report Status 11/10/2013 FINAL   Final    Anti-infectives   Start     Dose/Rate Route Frequency Ordered Stop   11/11/13 0600  vancomycin (VANCOCIN) IVPB 1000 mg/200 mL premix     1,000 mg 200 mL/hr over 60 Minutes Intravenous Every 24 hours 11/10/13 0912     11/10/13 1800  ceFEPIme (MAXIPIME) 1 g in dextrose 5 % 50 mL IVPB     1 g 100 mL/hr over 30 Minutes Intravenous Every 12 hours 11/10/13 0912     11/10/13 0800  vancomycin (VANCOCIN) IVPB 750 mg/150 ml premix  Status:  Discontinued     750 mg 150 mL/hr over 60 Minutes Intravenous Every 12 hours 11/09/13 1707 11/10/13 0912   11/09/13 2200  ceFEPIme (MAXIPIME) 1 g in dextrose 5 % 50 mL IVPB  Status:  Discontinued     1 g 100 mL/hr over 30 Minutes Intravenous 3 times per day 11/09/13 1853 11/10/13 0912   11/09/13 1545  vancomycin (VANCOCIN) 1,500 mg in sodium chloride 0.9 % 500 mL IVPB     1,500 mg 250 mL/hr over 120 Minutes Intravenous  Once 11/09/13 1539 11/09/13 2043   11/09/13 1545  piperacillin-tazobactam (ZOSYN) IVPB 3.375 g     3.375 g 100 mL/hr over 30 Minutes Intravenous  Once 11/09/13 1539 11/09/13 1816      Assessment: 63 y/o female on day 2 vancomycin and cefepime for HCAP. Of note, patient failed outpatient treatment with cefdinir and Levaquin. SCr bumped up slightly from yesterday so will adjust antibiotics accordingly. Cultures are negative so far, WBC remain normal, and patient is afebrile.  Goal of Therapy:  Vancomycin trough level 15-20 mcg/ml  Plan:  - Change vancomycin to 1000 mg IV q24h - Change cefepime to 1 g IV q12h - Monitor renal function, culture data, and clinical course - Vancomycin trough as clinically  indicated  [facility], Pharm.D., BCPS Clinical Pharmacist Pager: [phone] 11/10/2013 9:18 AM

## 2013-11-10 NOTE — Progress Notes (Signed)
Pt transferred to [place] [facility] from [place].  Pt alert and oriented. Up with 1 assist to bsc.  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  Pleasant.  Denies pain at this time.

## 2013-11-11 DIAGNOSIS — J9600 Acute respiratory failure, unspecified whether with hypoxia or hypercapnia: Secondary | ICD-10-CM

## 2013-11-11 LAB — GLUCOSE, CAPILLARY
GLUCOSE-CAPILLARY: 212 mg/dL — AB (ref 70–99)
Glucose-Capillary: 225 mg/dL — ABNORMAL HIGH (ref 70–99)
Glucose-Capillary: 181 mg/dL — ABNORMAL HIGH (ref 70–99)
Glucose-Capillary: 183 mg/dL — ABNORMAL HIGH (ref 70–99)

## 2013-11-11 MED ORDER — METHYLPREDNISOLONE SODIUM SUCC 125 MG IJ SOLR
60.0000 mg | Freq: Three times a day (TID) | INTRAMUSCULAR | Status: DC
  Administered 2013-11-11 – 2013-11-12 (×2): 60 mg via INTRAVENOUS
  Filled 2013-11-11 (×5): qty 0.96

## 2013-11-11 MED ORDER — LEVALBUTEROL HCL 0.63 MG/3ML IN NEBU
0.6300 mg | INHALATION_SOLUTION | RESPIRATORY_TRACT | Status: DC | PRN
Start: 2013-11-11 — End: 2013-11-13

## 2013-11-11 NOTE — Progress Notes (Addendum)
PROGRESS NOTE   Tiffany [name] WFU:[id number] DOB: 1951/08/03 DOA: 11/09/2013 PCP: Tivis Ringer, MD  Brief narrative: 63 year old female with history of spina bifida, chronic left lower extremity weakness, hypertension, diabetes presented to the ER with productive cough and shortness of breath, wheezing for the last 10 days. Patient reports that her symptoms started about 10 days ago with she initially used OTC Mucinex which did not help. Subsequently, last week she saw PA at her PCPs office and was started on albuterol inhaler with antibiotic (cannot recall the name of the antibiotic) which also did not improve her symptoms. After 3 days later, then patient continued to have copious amount of greenish productive phlegm, shortness of breath and wheezing, she went to her PCP on Friday 5 days ago and was placed on Levaquin which she has completed with no significant improvement in her symptoms. The patient reports that chest x-ray was done outpatient and had shown right lung pneumonia. In the ear, CT scan was done which showed no pulmonary embolism but extensive bronchiectatic changes bilaterally with widespread nodular interstitial disease, patchy infiltrates in the left upper lobe. She denied any chest pain, leg swelling or cough tenderness.   Assessment/Plan: Active Problems:  Acute respiratory failure with hypoxia:   A) HCAP (healthcare-associated pneumonia)   B) Bronchiectasis -CXR unrevealing but CTA chest with bilateral bronchiectasis and scattered tiny nodules -CT angio with patchy infiltrate in L. Upper lobe, continue HCAP treatment  -cont supportive care  With O2, nebs, steroids- begin tapering steroids -she she is to follow up with pulmonology/Dr. Elsworth Soho oupt    Tachycardia -dehydration contrbuting factor, follow     Acute renal insufficiency -baseline unknown -improving with hydration, recheck in am    Pulmonary nodules -per radiologist: recommend FU CT- non smoker so  consider at 6 months -OP pulmonology follow up with Dr Elsworth Soho- patient states her PCP was planning to refer her to pulmonology    GERD/ IBS   DVT prophylaxis: Lovenox Code Status: Full Family Communication: sister/family at bedside Disposition Plan/Expected LOS: likely to home when medically ready   Consultants: None  Procedures: None  Antibiotics: Cefepime 3/17 >>> Zosyn 3/17 >>>3/17 Vancomycin 3/17 >>>  HPI/Subjective: Alert. States feeling better today, ambulated with min assist to [place]. Still some sob with min activity  Objective: Blood pressure 133/72, pulse 85, temperature 98.5 F (36.9 C), temperature source Oral, resp. rate 18, height 5\' 3"  (1.6 m), weight 75.297 kg (166 lb), SpO2 92.00%.  Intake/Output Summary (Last 24 hours) at 11/11/13 1813 Last data filed at 11/11/13 1300  Gross per 24 hour  Intake 2602.5 ml  Output      0 ml  Net 2602.5 ml     Exam:  General: alert & oriented x 3 In NAD Cardiovascular: RRR, nl S1 s2 Respiratory: scattered rhonchi anteriorly, decreased BS at bases, no wheezes Abdomen: soft +BS NT/ND, no masses palpable Extremities: No cyanosis and no edema   Scheduled Meds:  Scheduled Meds: . atorvastatin  40 mg Oral q1800  . benzonatate  100 mg Oral TID  . calcium carbonate  1 tablet Oral Q breakfast  . ceFEPime (MAXIPIME) IV  1 g Intravenous Q12H  . enoxaparin (LOVENOX) injection  30 mg Subcutaneous Q24H  . insulin aspart  0-5 Units Subcutaneous QHS  . insulin aspart  0-9 Units Subcutaneous TID WC  . methylPREDNISolone (SOLU-MEDROL) injection  60 mg Intravenous Q6H  . mometasone-formoterol  2 puff Inhalation BID  . pantoprazole  40 mg Oral  [id number]  . vancomycin  1,000 mg Intravenous Q24H   Continuous Infusions: . sodium chloride 75 mL/hr (11/10/13 2042)    Data Reviewed: Basic Metabolic Panel:  Recent Labs Lab 11/09/13 1434 11/10/13 0258  NA 140 142  K 4.4 4.5  CL 98 104  CO2 25 25  GLUCOSE 149* 181*  BUN 19  23  CREATININE 1.35* 1.47*  CALCIUM 9.7 9.4   Liver Function Tests: No results found for this basename: AST, ALT, ALKPHOS, BILITOT, PROT, ALBUMIN,  in the last 168 hours No results found for this basename: LIPASE, AMYLASE,  in the last 168 hours No results found for this basename: AMMONIA,  in the last 168 hours CBC:  Recent Labs Lab 11/09/13 1434 11/10/13 0258  WBC 9.1 8.2  HGB 13.2 12.1  HCT 39.8 36.5  MCV 91.7 92.6  PLT 254 218   Cardiac Enzymes: No results found for this basename: CKTOTAL, CKMB, CKMBINDEX, TROPONINI,  in the last 168 hours BNP (last 3 results)  Recent Labs  11/09/13 1434  PROBNP 22.1   CBG:  Recent Labs Lab 11/10/13 1752 11/10/13 2155 11/11/13 0751 11/11/13 1129 11/11/13 1554  GLUCAP 249* 164* 225* 181* 183*    Recent Results (from the past 240 hour(s))  CULTURE, BLOOD (ROUTINE X 2)     Status: None   Collection Time    11/09/13  5:15 PM      Result Value Ref Range Status   Specimen Description BLOOD LEFT ARM   Final   Special Requests BOTTLES DRAWN AEROBIC AND ANAEROBIC 5CC   Final   Culture  Setup Time     Final   Value: 11/09/2013 22:38     Performed at [facility]   Culture     Final   Value:        BLOOD CULTURE RECEIVED NO GROWTH TO DATE CULTURE WILL BE HELD FOR 5 DAYS BEFORE ISSUING A FINAL NEGATIVE REPORT     Performed at [facility]   Report Status PENDING   Incomplete  CULTURE, BLOOD (ROUTINE X 2)     Status: None   Collection Time    11/09/13  5:25 PM      Result Value Ref Range Status   Specimen Description BLOOD LEFT HAND   Final   Special Requests BOTTLES DRAWN AEROBIC AND ANAEROBIC 5CC   Final   Culture  Setup Time     Final   Value: 11/09/2013 22:38     Performed at [facility]   Culture     Final   Value:        BLOOD CULTURE RECEIVED NO GROWTH TO DATE CULTURE WILL BE HELD FOR 5 DAYS BEFORE ISSUING A FINAL NEGATIVE REPORT     Performed at [facility]   Report Status PENDING    Incomplete  MRSA PCR SCREENING     Status: None   Collection Time    11/09/13  7:09 PM      Result Value Ref Range Status   MRSA by PCR NEGATIVE  NEGATIVE Final   Comment:            The GeneXpert MRSA Assay (FDA     approved for NASAL specimens     only), is one component of a     comprehensive MRSA colonization     surveillance program. It is not     intended to diagnose MRSA     infection nor to guide or  monitor treatment for     MRSA infections.  CULTURE, EXPECTORATED SPUTUM-ASSESSMENT     Status: None   Collection Time    11/10/13  1:42 AM      Result Value Ref Range Status   Specimen Description SPUTUM   Final   Special Requests Normal   Final   Sputum evaluation     Final   Value: THIS SPECIMEN IS ACCEPTABLE. RESPIRATORY CULTURE REPORT TO FOLLOW.   Report Status 11/10/2013 FINAL   Final  CULTURE, RESPIRATORY (NON-EXPECTORATED)     Status: None   Collection Time    11/10/13  1:42 AM      Result Value Ref Range Status   Specimen Description SPUTUM   Final   Special Requests NONE   Final   Gram Stain     Final   Value: ABUNDANT WBC PRESENT, PREDOMINANTLY PMN     RARE SQUAMOUS EPITHELIAL CELLS PRESENT     RARE GRAM POSITIVE COCCI     IN PAIRS RARE GRAM VARIABLE ROD     Performed at [facility]   Culture     Final   Value: NORMAL OROPHARYNGEAL FLORA     Performed at [facility]   Report Status PENDING   Incomplete  URINE CULTURE     Status: None   Collection Time    11/10/13  6:14 AM      Result Value Ref Range Status   Specimen Description URINE, CLEAN CATCH   Final   Special Requests Normal   Final   Culture  Setup Time     Final   Value: 11/10/2013 08:05     Performed at [facility] Count     Final   Value: NO GROWTH     Performed at [facility]   Culture     Final   Value: NO GROWTH     Performed at [facility]   Report Status PENDING   Incomplete     Studies:  I have reviewed Recent x-ray  studies.  Time spent :[place], MD [facility] Office  [phone] Pager [phone]  **If unable to reach the above provider after paging please contact the [place] @ [phone]  On-Call/Text Page:      [email or web address]      password [email or web address]  If 7PM-7AM, please contact night-coverage [email or web address] Password [email or web address] 11/11/2013, 6:13 PM   LOS: 2 days

## 2013-11-12 LAB — BASIC METABOLIC PANEL
BUN: 32 mg/dL — ABNORMAL HIGH (ref 6–23)
CALCIUM: 9.2 mg/dL (ref 8.4–10.5)
CO2: 23 mEq/L (ref 19–32)
Chloride: 104 mEq/L (ref 96–112)
Creatinine, Ser: 1.03 mg/dL (ref 0.50–1.10)
GFR calc Af Amer: 67 mL/min — ABNORMAL LOW (ref >90)
GFR calc non Af Amer: 57 mL/min — ABNORMAL LOW (ref >90)
GLUCOSE: 183 mg/dL — AB (ref 70–99)
Potassium: 3.9 mEq/L (ref 3.7–5.3)
Sodium: 142 mEq/L (ref 137–147)

## 2013-11-12 LAB — GLUCOSE, CAPILLARY
Glucose-Capillary: 194 mg/dL — ABNORMAL HIGH (ref 70–99)
Glucose-Capillary: 182 mg/dL — ABNORMAL HIGH (ref 70–99)
Glucose-Capillary: 240 mg/dL — ABNORMAL HIGH (ref 70–99)
Glucose-Capillary: 158 mg/dL — ABNORMAL HIGH (ref 70–99)

## 2013-11-12 LAB — URINE CULTURE
COLONY COUNT: NO GROWTH
Culture: NO GROWTH
SPECIAL REQUESTS: NORMAL

## 2013-11-12 LAB — CULTURE, RESPIRATORY: CULTURE: NORMAL

## 2013-11-12 MED ORDER — METHYLPREDNISOLONE SODIUM SUCC 125 MG IJ SOLR
60.0000 mg | Freq: Two times a day (BID) | INTRAMUSCULAR | Status: DC
  Administered 2013-11-12 – 2013-11-13 (×2): 60 mg via INTRAVENOUS
  Filled 2013-11-12 (×4): qty 0.96

## 2013-11-12 MED ORDER — CEFEPIME HCL 1 G IJ SOLR
1.0000 g | INTRAMUSCULAR | Status: DC
  Filled 2013-11-12 (×2): qty 1

## 2013-11-12 NOTE — Progress Notes (Addendum)
PROGRESS NOTE   Tiffany [name] PPI:[id number] DOB: 09/07/1950 DOA: 11/09/2013 PCP: Tivis Ringer, MD  Brief narrative: 63 year old female with history of spina bifida, chronic left lower extremity weakness, hypertension, diabetes presented to the ER with productive cough and shortness of breath, wheezing for the last 10 days. Patient reports that her symptoms started about 10 days ago with she initially used OTC Mucinex which did not help. Subsequently, last week she saw PA at her PCPs office and was started on albuterol inhaler with antibiotic (cannot recall the name of the antibiotic) which also did not improve her symptoms. After 3 days later, then patient continued to have copious amount of greenish productive phlegm, shortness of breath and wheezing, she went to her PCP on Friday 5 days ago and was placed on Levaquin which she has completed with no significant improvement in her symptoms. The patient reports that chest x-ray was done outpatient and had shown right lung pneumonia. In the ear, CT scan was done which showed no pulmonary embolism but extensive bronchiectatic changes bilaterally with widespread nodular interstitial disease, patchy infiltrates in the left upper lobe. She denied any chest pain, leg swelling or cough tenderness.   Assessment/Plan: Active Problems:  Acute respiratory failure with hypoxia:   A) HCAP (healthcare-associated pneumonia)   B) Bronchiectasis -CXR unrevealing but CTA chest with bilateral bronchiectasis and scattered tiny nodules -CT angio with patchy infiltrate in L. Upper lobe, continue HCAP treatment  -cont supportive care -nebs, steroids- continue tapering steroids>> plan to change to oral prednisone beginning in a.m. -She is improving clinically, has been weaned off O2>>follow -she is to follow up with pulmonology outpatient>> referring to Dr. Elsworth Soho    Tachycardia -dehydration contrbuting factor, resolved.     Acute renal  insufficiency -baseline unknown -improving with hydration, recheck in am    Pulmonary nodules -per radiologist: recommend FU CT- non smoker so consider at 6 months -OP pulmonology follow up with Dr Elsworth Soho- patient states her PCP was planning to refer her to pulmonology    GERD/ IBS Continue PPI-  DVT prophylaxis: Lovenox Code Status: Full Family Communication: sister/family at bedside Disposition Plan/Expected LOS: likely to home when medically ready   Consultants: None  Procedures: None  Antibiotics: Cefepime 3/17 >>> Zosyn 3/17 >>>3/17 Vancomycin 3/17 >>>  HPI/Subjective:  sitting up in chair, off 02 since last p.m. Denies shortness of breath and no chest pain.  Objective: Blood pressure 130/85, pulse 71, temperature 98.5 F (36.9 C), temperature source Oral, resp. rate 18, height 5\' 3"  (1.6 m), weight 75.297 kg (166 lb), SpO2 97.00%.  Intake/Output Summary (Last 24 hours) at 11/12/13 1359 Last data filed at 11/12/13 1300  Gross per 24 hour  Intake   2735 ml  Output      0 ml  Net   2735 ml     Exam:  General: alert & oriented x 3 In NAD Cardiovascular: RRR, nl S1 s2 Respiratory:  clear, no wheezes and no crackles.  Abdomen: soft +BS NT/ND, no masses palpable Extremities: No cyanosis and no edema   Scheduled Meds:  Scheduled Meds: . atorvastatin  40 mg Oral q1800  . benzonatate  100 mg Oral TID  . calcium carbonate  1 tablet Oral Q breakfast  . [START ON 11/13/2013] ceFEPime (MAXIPIME) IV  1 g Intravenous Q24H  . enoxaparin (LOVENOX) injection  30 mg Subcutaneous Q24H  . insulin aspart  0-5 Units Subcutaneous QHS  . insulin aspart  0-9 Units Subcutaneous TID WC  .  methylPREDNISolone (SOLU-MEDROL) injection  60 mg Intravenous Q12H  . mometasone-formoterol  2 puff Inhalation BID  . pantoprazole  40 mg Oral Q0600  . vancomycin  1,000 mg Intravenous Q24H   Continuous Infusions: . sodium chloride 75 mL/hr (11/10/13 2042)    Data Reviewed: Basic  Metabolic Panel:  Recent Labs Lab 11/09/13 1434 11/10/13 0258 11/12/13 1128  NA 140 142 142  K 4.4 4.5 3.9  CL 98 104 104  CO2 25 25 23   GLUCOSE 149* 181* 183*  BUN 19 23 32*  CREATININE 1.35* 1.47* 1.03  CALCIUM 9.7 9.4 9.2   Liver Function Tests: No results found for this basename: AST, ALT, ALKPHOS, BILITOT, PROT, ALBUMIN,  in the last 168 hours No results found for this basename: LIPASE, AMYLASE,  in the last 168 hours No results found for this basename: AMMONIA,  in the last 168 hours CBC:  Recent Labs Lab 11/09/13 1434 11/10/13 0258  WBC 9.1 8.2  HGB 13.2 12.1  HCT 39.8 36.5  MCV 91.7 92.6  PLT 254 218   Cardiac Enzymes: No results found for this basename: CKTOTAL, CKMB, CKMBINDEX, TROPONINI,  in the last 168 hours BNP (last 3 results)  Recent Labs  11/09/13 1434  PROBNP 22.1   CBG:  Recent Labs Lab 11/11/13 1129 11/11/13 1554 11/11/13 2021 11/12/13 0745 11/12/13 1132  GLUCAP 181* 183* 212* 194* 182*    Recent Results (from the past 240 hour(s))  CULTURE, BLOOD (ROUTINE X 2)     Status: None   Collection Time    11/09/13  5:15 PM      Result Value Ref Range Status   Specimen Description BLOOD LEFT ARM   Final   Special Requests BOTTLES DRAWN AEROBIC AND ANAEROBIC 5CC   Final   Culture  Setup Time     Final   Value: 11/09/2013 22:38     Performed at [facility]   Culture     Final   Value:        BLOOD CULTURE RECEIVED NO GROWTH TO DATE CULTURE WILL BE HELD FOR 5 DAYS BEFORE ISSUING A FINAL NEGATIVE REPORT     Performed at [facility]   Report Status PENDING   Incomplete  CULTURE, BLOOD (ROUTINE X 2)     Status: None   Collection Time    11/09/13  5:25 PM      Result Value Ref Range Status   Specimen Description BLOOD LEFT HAND   Final   Special Requests BOTTLES DRAWN AEROBIC AND ANAEROBIC 5CC   Final   Culture  Setup Time     Final   Value: 11/09/2013 22:38     Performed at [facility]   Culture     Final    Value:        BLOOD CULTURE RECEIVED NO GROWTH TO DATE CULTURE WILL BE HELD FOR 5 DAYS BEFORE ISSUING A FINAL NEGATIVE REPORT     Performed at [facility]   Report Status PENDING   Incomplete  MRSA PCR SCREENING     Status: None   Collection Time    11/09/13  7:09 PM      Result Value Ref Range Status   MRSA by PCR NEGATIVE  NEGATIVE Final   Comment:            The GeneXpert MRSA Assay (FDA     approved for NASAL specimens     only), is one component of a  comprehensive MRSA colonization     surveillance program. It is not     intended to diagnose MRSA     infection nor to guide or     monitor treatment for     MRSA infections.  CULTURE, EXPECTORATED SPUTUM-ASSESSMENT     Status: None   Collection Time    11/10/13  1:42 AM      Result Value Ref Range Status   Specimen Description SPUTUM   Final   Special Requests Normal   Final   Sputum evaluation     Final   Value: THIS SPECIMEN IS ACCEPTABLE. RESPIRATORY CULTURE REPORT TO FOLLOW.   Report Status 11/10/2013 FINAL   Final  CULTURE, RESPIRATORY (NON-EXPECTORATED)     Status: None   Collection Time    11/10/13  1:42 AM      Result Value Ref Range Status   Specimen Description SPUTUM   Final   Special Requests NONE   Final   Gram Stain     Final   Value: ABUNDANT WBC PRESENT, PREDOMINANTLY PMN     RARE SQUAMOUS EPITHELIAL CELLS PRESENT     RARE GRAM POSITIVE COCCI     IN PAIRS RARE GRAM VARIABLE ROD     Performed at [facility]   Culture     Final   Value: NORMAL OROPHARYNGEAL FLORA     Performed at [facility]   Report Status 11/12/2013 FINAL   Final  URINE CULTURE     Status: None   Collection Time    11/10/13  6:14 AM      Result Value Ref Range Status   Specimen Description URINE, CLEAN CATCH   Final   Special Requests Normal   Final   Culture  Setup Time     Final   Value: 11/10/2013 08:05     Performed at [facility]     Final   Value: NO GROWTH      Performed at [facility]   Culture     Final   Value: NO GROWTH     Performed at [facility]   Report Status PENDING   Incomplete     Studies:  I have reviewed Recent x-ray studies.  Time spent :[place] [facility]  [phone] Pager [phone]  **If unable to reach the above provider after paging please contact the [place] @ [phone]  On-Call/Text Page:      [email or web address]      password [email or web address]  If 7PM-7AM, please contact night-coverage [email or web address] Password [email or web address] 11/12/2013, 1:59 PM   LOS: 3 days

## 2013-11-12 NOTE — Evaluation (Signed)
Occupational Therapy Evaluation Patient Details Name: Tiffany [name] MRN: [id number] DOB: 1951/02/20 Today's Date: 11/12/2013 Time: 6606-3016 OT Time Calculation (min): 36 min  OT Assessment / Plan / Recommendation History of present illness 63 year old female with history of spina bifida, chronic left lower extremity weakness, hypertension, diabetes presented to the ER with productive cough and shortness of breath, wheezing for the last 10 days. Patient reports that her symptoms started about 10 days ago with she initially used OTC Mucinex which did not help. Subsequently, last week she saw PA at her PCPs office and was started on albuterol inhaler with antibiotic (cannot recall the name of the antibiotic) which also did not improve her symptoms. After 3 days later, then patient continued to have copious amount of greenish productive phlegm, shortness of breath and wheezing, she went to her PCP on Friday 5 days ago and was placed on Levaquin which she has completed with no significant improvement in her symptoms. The patient reports that chest x-ray was done outpatient and had shown right lung pneumonia. In the ear, CT scan was done which showed no pulmonary embolism but extensive bronchiectatic changes bilaterally with widespread nodular interstitial disease, patchy infiltrates in the left upper lobe. She denied any chest pain, leg swelling or cough tenderness.   Clinical Impression   Pt admitted with above. Will continue to follow pt acutely in order to address below problem list. Recommend 24/7 supervision/assist initially upon return home.     OT Assessment  Patient needs continued OT Services    Follow Up Recommendations  No OT follow up;Supervision/Assistance - 24 hour    Barriers to Discharge      Equipment Recommendations  None recommended by OT    Recommendations for Other Services    Frequency  Min 2X/week    Precautions / Restrictions Precautions Required Braces or Orthoses:  Other Brace/Splint Other Brace/Splint: L AFO   Pertinent Vitals/Pain See vitals    ADL  Eating/Feeding: Performed;Independent Where Assessed - Eating/Feeding: Edge of bed Lower Body Bathing: Simulated;Set up Where Assessed - Lower Body Bathing: Unsupported sitting Lower Body Dressing: Performed;Set up Where Assessed - Lower Body Dressing: Unsupported sitting Toilet Transfer: Simulated;Min guard Toilet Transfer Method: Sit to Loss adjuster, chartered:  (bed>recliner) Equipment Used: Gait belt;Cane Transfers/Ambulation Related to ADLs: Min guard ambulating with SPC.   Slower gait than at baseline per pt report.  ADL Comments: 3/4 dyspnea when talking.  Encouraged pt to focus on breathing rather than talking during ambulation.  Pt able to don shoes and L AFO while long sitting in bed with set up assist only.  Educated pt on deep breathing techniques and to take rest breaks as needed during mobility and ADL tasks.    OT Diagnosis: Generalized weakness  OT Problem List: Decreased strength;Decreased activity tolerance OT Treatment Interventions: Self-care/ADL training;DME and/or AE instruction;Energy conservation;Therapeutic activities;Patient/family education   OT Goals(Current goals can be found in the care plan section) Acute Rehab OT Goals Patient Stated Goal: to regain her strength and energy OT Goal Formulation: With patient Time For Goal Achievement: 11/19/13 Potential to Achieve Goals: Good  Visit Information  Last OT Received On: 11/12/13 Assistance Needed: +1 History of Present Illness: 63 year old female with history of spina bifida, chronic left lower extremity weakness, hypertension, diabetes presented to the ER with productive cough and shortness of breath, wheezing for the last 10 days. Patient reports that her symptoms started about 10 days ago with she initially used OTC Mucinex which did not help. Subsequently,  last week she saw PA at her PCPs office and was  started on albuterol inhaler with antibiotic (cannot recall the name of the antibiotic) which also did not improve her symptoms. After 3 days later, then patient continued to have copious amount of greenish productive phlegm, shortness of breath and wheezing, she went to her PCP on Friday 5 days ago and was placed on Levaquin which she has completed with no significant improvement in her symptoms. The patient reports that chest x-ray was done outpatient and had shown right lung pneumonia. In the ear, CT scan was done which showed no pulmonary embolism but extensive bronchiectatic changes bilaterally with widespread nodular interstitial disease, patchy infiltrates in the left upper lobe. She denied any chest pain, leg swelling or cough tenderness.       Prior [place] expects to be discharged to:: Private residence Living Arrangements: Spouse/significant other Available Help at Discharge: Family;Available 24 hours/day Type of Home: House Home Access: Stairs to enter [employer] of Steps: 2 Home Layout: Bed/bath upstairs;Two level;1/2 bath on main level Alternate Level Stairs-Number of Steps: has a chair lift to get upstairs Home Equipment: Tub bench;Grab bars - toilet;Cane - single point;Wheelchair - manual;Walker - standard (Market researcher; rollator) Additional Comments: Husband does some contract work but can be home 24/7 as needed by pt. Prior Function Level of Independence: Independent with assistive device(s) Comments: Uses Left AFO and cane for ambulation in house. Communication Communication: No difficulties         Vision/Perception     Cognition  Cognition Arousal/Alertness: Awake/alert Behavior During Therapy: WFL for tasks assessed/performed Overall Cognitive Status: Within Functional Limits for tasks assessed    Extremity/Trunk Assessment Upper Extremity Assessment Upper Extremity Assessment: Overall WFL for tasks assessed      Mobility Bed Mobility Overal bed mobility: Independent Transfers Overall transfer level: Needs assistance Equipment used: Straight cane Transfers: Sit to/from Stand Sit to Stand: Supervision General transfer comment: Supervision for safety only.     Exercise     Balance     End of Session OT - End of Session Equipment Utilized During Treatment: Gait belt [facility]) Activity Tolerance: Patient tolerated treatment well Patient left: in chair;with call bell/phone within reach Nurse Communication: Mobility status  GO    11/12/2013 Darrol Jump OTR/L Pager [phone] Office [phone]   Darrol Jump 11/12/2013, 9:33 AM

## 2013-11-12 NOTE — Evaluation (Signed)
Physical Therapy Evaluation Patient Details Name: Tiffany [name] MRN: [id number] DOB: 01/17/1951 Today's Date: 11/12/2013 Time: 0102-7253 PT Time Calculation (min): 40 min  PT Assessment / Plan / Recommendation History of Present Illness  63 y/o female with h/o spina bifida and LE weakness, HTN, diabetes admitted with right lung PNA.  Clinical Impression  Patient presents with decreased independence with mobility due to deficits listed below (PT problem list).  She will benefit from skilled PT in the acute setting to allow return home with family assist and follow up outpatient PT.  With history of spina bifida and current imbalance may benefit from outpatient neurorehabilitation.  Difficulty on stairs and reports plans for putting up rails on entry stairs.  Active with community exercise; encouraged to return to pool exercise class and not to be afraid to use pool lift.    PT Assessment  Patient needs continued PT services    Follow Up Recommendations  Outpatient PT    Does the patient have the potential to tolerate intense rehabilitation    N/A  Barriers to Discharge  None      Equipment Recommendations  None recommended by PT    Recommendations for Other Services   None  Frequency Min 3X/week    Precautions / Restrictions Precautions Precautions: Fall Precaution Comments: fell day prior to admission rushing to get to door early in the morning, bruise to left UE Required Braces or Orthoses: Other Brace/Splint Other Brace/Splint: L AFO   Pertinent Vitals/Pain Min c/o right knee pain descending stairs      Mobility  Bed Mobility Overal bed mobility: Independent General bed mobility comments: Pt up in chair Transfers Overall transfer level: Needs assistance Equipment used: Straight cane Transfers: Sit to/from Stand Sit to Stand: Supervision General transfer comment: use of armrests and increased time once she attempted and sat back in  chair Ambulation/Gait Ambulation/Gait assistance: Min guard;Supervision Ambulation Distance (Feet): 225 Feet Assistive device: Straight cane (left AFO) Gait Pattern/deviations: Step-through pattern;Decreased stride length;Wide base of support;Step-to pattern General Gait Details: initially more unsteady with step to pattern; able to progress to step through pattern with increased step length and more confidence Stairs: Yes Stairs assistance: Min assist Stair Management: Forwards;One rail Right;With cane;Step to pattern Number of Stairs: 2 General stair comments: harder time coming down due to right knee arthritis pain    Exercises     PT Diagnosis: Abnormality of gait;Generalized weakness  PT Problem List: Decreased strength;Decreased mobility;Decreased balance;Decreased activity tolerance PT Treatment Interventions: DME instruction;Therapeutic exercise;Gait training;Balance training;Therapeutic activities;Patient/family education;Functional mobility training;Stair training     PT Goals(Current goals can be found in the care plan section) Acute Rehab PT Goals Patient Stated Goal: to regain her strength and energy PT Goal Formulation: With patient Time For Goal Achievement: 11/26/13 Potential to Achieve Goals: Good  Visit Information  Last PT Received On: 11/12/13 Assistance Needed: +1 History of Present Illness: 63 y/o female with h/o spina bifida and LE weakness, HTN, diabetes admitted with right lung PNA.       Prior [place] expects to be discharged to:: Private residence Living Arrangements: Spouse/significant other Available Help at Discharge: Family;Available 24 hours/day Type of Home: House Home Access: Stairs to enter [employer] of Steps: 2 Home Layout: Bed/bath upstairs;Two level;1/2 bath on main level Alternate Level Stairs-Number of Steps: has a chair lift to get upstairs Home Equipment: Walker - 4 wheels;Walker - 2  wheels;Wheelchair - manual;Cane - single point;Grab bars - toilet;Tub bench Additional Comments:  Husband does some contract work but can be home 24/7 as needed by pt. Prior Function Level of Independence: Independent with assistive device(s) Comments: Uses Left AFO and cane for ambulation in house. Communication Communication: No difficulties    Cognition  Cognition Arousal/Alertness: Awake/alert Behavior During Therapy: WFL for tasks assessed/performed Overall Cognitive Status: Within Functional Limits for tasks assessed    Extremity/Trunk Assessment Upper Extremity Assessment Upper Extremity Assessment: Overall WFL for tasks assessed Lower Extremity Assessment Lower Extremity Assessment: RLE deficits/detail;LLE deficits/detail RLE Deficits / Details: AROM and strength WFL reports needs TKA h/o two previous meniscal repairs LLE Deficits / Details: PROM WFL, strength ankle DF 0/5, PF 1/5, knee extension 3+/5, flexion 2/5, hip flexion 2+/5 LLE Sensation: decreased light touch (bottom of foot and lateral aspect)   Balance Balance Overall balance assessment: History of Falls;Needs assistance Standing balance-Leahy Scale: Fair Standing balance comment: unsteady initially; better through session with walking  End of Session PT - End of Session Equipment Utilized During Treatment: Gait belt Activity Tolerance: Patient tolerated treatment well Patient left: in chair;with call bell/phone within reach  GP     [facility] 11/12/2013, 11:58 AM Magda Kiel, [place] 11/12/2013

## 2013-11-12 NOTE — Progress Notes (Signed)
ANTIBIOTIC CONSULT NOTE - FOLLOW UP  Pharmacy Consult for vancomycin and cefepime Indication: HCAP  Allergies  Allergen Reactions  . Ambien [Zolpidem Tartrate] Other (See Comments)    Nightmares and insomnia  . Aspirin Other (See Comments)    GI bleed  . Benicar [Olmesartan Medoxomil] Shortness Of Breath, Swelling and Other (See Comments)    Swelling of throat  . Ketorolac Tromethamine Anaphylaxis  . Latex Shortness Of Breath and Itching  . Lisinopril Shortness Of Breath, Swelling and Other (See Comments)     Swelling of throat  . Other Anaphylaxis    Peaches and mangoes, tree nuts, tomatoes, shellfish  . Reclast [Zoledronic Acid] Other (See Comments)    Ocular reaction, toxic  . Toradol [Ketorolac Tromethamine] Anaphylaxis  . Codeine Nausea And Vomiting  . Erythromycin Nausea And Vomiting  . Fenofibrate Nausea And Vomiting  . Glucosamine Forte [Nutritional Supplements] Diarrhea and Nausea Only  . Levaquin [Levofloxacin In D5w] Diarrhea and Swelling  . Percocet [Oxycodone-Acetaminophen] Nausea And Vomiting  . Tramadol Nausea And Vomiting  . Tizanidine Other (See Comments)    unknown    Patient Measurements: Height: 5\' 3"  (160 cm) Weight: 166 lb (75.297 kg) IBW/kg (Calculated) : 52.4  Vital Signs: Temp: 97.7 F (36.5 C) (03/20 0440) Temp src: Oral (03/20 0440) BP: 129/71 mmHg (03/20 0440) Pulse Rate: 84 (03/20 0440) Intake/Output from previous day: 03/19 0701 - 03/20 0700 In: 4255 [P.O.:180; I.V.:3675; IV Piggyback:400] Out: -  Intake/Output from this shift:    Labs:  Recent Labs  11/09/13 1434 11/10/13 0258  WBC 9.1 8.2  HGB 13.2 12.1  PLT 254 218  CREATININE 1.35* 1.47*   Estimated Creatinine Clearance: 39.1 ml/min (by C-G formula based on Cr of 1.47). No results found for this basename: VANCOTROUGH, VANCOPEAK, VANCORANDOM, [place], [place], GENTRANDOM, TOBRATROUGH, TOBRAPEAK, TOBRARND, AMIKACINPEAK, AMIKACINTROU, AMIKACIN,  in the last 72 hours    Microbiology: Recent Results (from the past 720 hour(s))  CULTURE, BLOOD (ROUTINE X 2)     Status: None   Collection Time    11/09/13  5:15 PM      Result Value Ref Range Status   Specimen Description BLOOD LEFT ARM   Final   Special Requests BOTTLES DRAWN AEROBIC AND ANAEROBIC 5CC   Final   Culture  Setup Time     Final   Value: 11/09/2013 22:38     Performed at [facility]   Culture     Final   Value:        BLOOD CULTURE RECEIVED NO GROWTH TO DATE CULTURE WILL BE HELD FOR 5 DAYS BEFORE ISSUING A FINAL NEGATIVE REPORT     Performed at [facility]   Report Status PENDING   Incomplete  CULTURE, BLOOD (ROUTINE X 2)     Status: None   Collection Time    11/09/13  5:25 PM      Result Value Ref Range Status   Specimen Description BLOOD LEFT HAND   Final   Special Requests BOTTLES DRAWN AEROBIC AND ANAEROBIC 5CC   Final   Culture  Setup Time     Final   Value: 11/09/2013 22:38     Performed at [facility]   Culture     Final   Value:        BLOOD CULTURE RECEIVED NO GROWTH TO DATE CULTURE WILL BE HELD FOR 5 DAYS BEFORE ISSUING A FINAL NEGATIVE REPORT     Performed at [facility]   Report  Status PENDING   Incomplete  MRSA PCR SCREENING     Status: None   Collection Time    11/09/13  7:09 PM      Result Value Ref Range Status   MRSA by PCR NEGATIVE  NEGATIVE Final   Comment:            The GeneXpert MRSA Assay (FDA     approved for NASAL specimens     only), is one component of a     comprehensive MRSA colonization     surveillance program. It is not     intended to diagnose MRSA     infection nor to guide or     monitor treatment for     MRSA infections.  CULTURE, EXPECTORATED SPUTUM-ASSESSMENT     Status: None   Collection Time    11/10/13  1:42 AM      Result Value Ref Range Status   Specimen Description SPUTUM   Final   Special Requests Normal   Final   Sputum evaluation     Final   Value: THIS SPECIMEN IS ACCEPTABLE. RESPIRATORY  CULTURE REPORT TO FOLLOW.   Report Status 11/10/2013 FINAL   Final  CULTURE, RESPIRATORY (NON-EXPECTORATED)     Status: None   Collection Time    11/10/13  1:42 AM      Result Value Ref Range Status   Specimen Description SPUTUM   Final   Special Requests NONE   Final   Gram Stain     Final   Value: ABUNDANT WBC PRESENT, PREDOMINANTLY PMN     RARE SQUAMOUS EPITHELIAL CELLS PRESENT     RARE GRAM POSITIVE COCCI     IN PAIRS RARE GRAM VARIABLE ROD     Performed at [facility]   Culture     Final   Value: NORMAL OROPHARYNGEAL FLORA     Performed at [facility]   Report Status 11/12/2013 FINAL   Final  URINE CULTURE     Status: None   Collection Time    11/10/13  6:14 AM      Result Value Ref Range Status   Specimen Description URINE, CLEAN CATCH   Final   Special Requests Normal   Final   Culture  Setup Time     Final   Value: 11/10/2013 08:05     Performed at [place]     Final   Value: NO GROWTH     Performed at [facility]   Culture     Final   Value: NO GROWTH     Performed at [facility]   Report Status PENDING   Incomplete    Anti-infectives   Start     Dose/Rate Route Frequency Ordered Stop   11/13/13 0552  ceFEPIme (MAXIPIME) 1 g in dextrose 5 % 50 mL IVPB     1 g 100 mL/hr over 30 Minutes Intravenous Every 24 hours 11/12/13 0830     11/11/13 0600  vancomycin (VANCOCIN) IVPB 1000 mg/200 mL premix     1,000 mg 200 mL/hr over 60 Minutes Intravenous Every 24 hours 11/10/13 0912     11/10/13 1800  ceFEPIme (MAXIPIME) 1 g in dextrose 5 % 50 mL IVPB  Status:  Discontinued     1 g 100 mL/hr over 30 Minutes Intravenous Every 12 hours 11/10/13 0912 11/12/13 0830   11/10/13 0800  vancomycin (VANCOCIN) IVPB 750 mg/150 ml premix  Status:  Discontinued     750 mg 150 mL/hr over 60 Minutes Intravenous Every 12 hours 11/09/13 1707 11/10/13 0912   11/09/13 2200  ceFEPIme (MAXIPIME) 1 g in dextrose 5 % 50 mL IVPB   Status:  Discontinued     1 g 100 mL/hr over 30 Minutes Intravenous 3 times per day 11/09/13 1853 11/10/13 0912   11/09/13 1545  vancomycin (VANCOCIN) 1,500 mg in sodium chloride 0.9 % 500 mL IVPB     1,500 mg 250 mL/hr over 120 Minutes Intravenous  Once 11/09/13 1539 11/09/13 2043   11/09/13 1545  piperacillin-tazobactam (ZOSYN) IVPB 3.375 g     3.375 g 100 mL/hr over 30 Minutes Intravenous  Once 11/09/13 1539 11/09/13 1816      Assessment: 64 y/o female on day 4 vancomycin and cefepime for HCAP. Of note, patient failed outpatient treatment with cefdinir and Levaquin. SCr 1.47 so will adjust antibiotics accordingly. Cultures are negative so far, WBC remain normal, and patient is afebrile.  Goal of Therapy:  Vancomycin trough level 15-20 mcg/ml  Plan:  - Cont vancomycin to 1000 mg IV q24h - Change cefepime to 1 g IV q24 - Monitor renal function, culture data, and clinical course - Vancomycin trough as clinically  indicated  Jacia Sickman, Pharm.D Clinical Pharmacist Pager: [phone] 11/12/2013 8:31 AM

## 2013-11-13 LAB — GLUCOSE, CAPILLARY
GLUCOSE-CAPILLARY: 112 mg/dL — AB (ref 70–99)
Glucose-Capillary: 130 mg/dL — ABNORMAL HIGH (ref 70–99)
Glucose-Capillary: 114 mg/dL — ABNORMAL HIGH (ref 70–99)

## 2013-11-13 MED ORDER — PREDNISONE 20 MG PO TABS: ORAL_TABLET | ORAL | Status: DC

## 2013-11-13 MED ORDER — OMEPRAZOLE 40 MG PO CPDR: 40.0000 mg | DELAYED_RELEASE_CAPSULE | Freq: Every day | ORAL | Status: DC

## 2013-11-13 MED ORDER — DULOXETINE HCL 30 MG PO CPEP
30.0000 mg | ORAL_CAPSULE | Freq: Every day | ORAL | Status: DC
  Administered 2013-11-13: 30 mg via ORAL
  Filled 2013-11-13: qty 1

## 2013-11-13 MED ORDER — BENZONATATE 100 MG PO CAPS: 100.0000 mg | ORAL_CAPSULE | Freq: Three times a day (TID) | ORAL | Status: DC

## 2013-11-13 MED ORDER — AMOXICILLIN-POT CLAVULANATE 875-125 MG PO TABS: 1.0000 | ORAL_TABLET | Freq: Two times a day (BID) | ORAL | Status: DC

## 2013-11-13 NOTE — Discharge Summary (Signed)
Physician Discharge Summary  KAILEIGH VISWANATHAN JQB:[id number] DOB: Mar 09, 1951 DOA: 11/09/2013  PCP: Tivis Ringer, MD  Admit date: 11/09/2013 Discharge date: 11/13/2013  Time spent: >30 minutes  Recommendations for Outpatient Follow-up:  Follow-up Information   Follow up with Tivis Ringer, MD. (PCP in 1-2weeks, call for appt upon discharge)    Specialty:  Internal Medicine   Contact information:   [facility] [place] [address] [phone]       Follow up with Rigoberto Noel., MD. (Pulmonologist, call for appointment upon discharge)    Specialty:  Pulmonary Disease   Contact information:   [address] [address] [phone]      outpatient followup tests Followup CT scan of chest recommended in 6 months for pulmonary nodules(nonsmoker)   Discharge Diagnoses:  Active Problems:   GERD   IBS   HCAP (healthcare-associated pneumonia)   Tachycardia   Acute renal insufficiency   Acute respiratory failure with hypoxia   Bronchiectasis   Pulmonary nodules   DM type 2 (diabetes mellitus, type 2)   Discharge Condition: Improved/stable  Diet recommendation: Modified carbohydrate  Filed Weights   11/09/13 2000 11/10/13 0400 11/12/13 2051  Weight: 71.215 kg (157 lb) 75.297 kg (166 lb) 76.023 kg (167 lb 9.6 oz)    History of present illness:  Patient is a 63 year old female with history of spina bifida, chronic left lower extremity weakness, hypertension, diabetes presented to the ER with productive cough and shortness of breath, wheezing for the last 10 days. Patient reports that her symptoms started about 10 days ago with she initially used OTC Mucinex which did not help. Subsequently, last week she saw PA at her PCPs office and was started on albuterol inhaler with antibiotic (cannot recall the name of the antibiotic) which also did not improve her symptoms. After 3 days later, then patient continued to have copious  amount of greenish productive phlegm, shortness of breath and wheezing, she went to her PCP on Friday 5 days ago and was placed on Levaquin which she has completed with no significant improvement in her symptoms. The patient reports that chest x-ray was done outpatient and had shown right lung pneumonia. In the ear, CT scan was done which showed no pulmonary embolism but extensive bronchiectatic changes bilaterally with widespread nodular interstitial disease, patchy infiltrates in the left upper lobe.  She denied any chest pain, leg swelling or cough tenderness. She was admitted for further evaluation and management   Hospital Course:  Acute respiratory failure with hypoxia:  A) HCAP (healthcare-associated pneumonia)  B) Bronchiectasis  -As discussed above upon admission CXR unrevealing but CTA chest with bilateral bronchiectasis and scattered tiny nodules  and patchy infiltrate in L. Upper lobe, and patient was empirically placed on antibiotics for pneumonia She was also placed on-nebs, iv steroids -She was initially admitted to step down unit and as she improved she was transferred to the medical floor -She remained afebrile, with no leukocytosis and her respiratory status continued to improve she was weaned off O2. -PT OT was consulted saw patient and outpatient therapy recommended -With a diagnosis of bronchiectasis as well as pulmonary nodules as discussed below; outpatient referral to pulmonology discussed with patient and recommended that she follows up with Dr. Colbert Ewing preferred pulmonologist per her PCP Tachycardia  -dehydration contrbuting factor, resolved.  Acute renal insufficiency  -baseline unknown  -improving with hydration, recheck in am  -NSAIDs DC'd. Pulmonary nodules  -per radiologist: recommend FU CT- non smoker  so consider at 6 months  -OP pulmonology follow up with Dr Elsworth Soho- patient states her PCP was planning to refer her to pulmonology  GERD/ IBS  Continue  PPI   Procedures:  None  Consultations:  None  Discharge Exam: Filed Vitals:   11/13/13 1138  BP: 142/78  Pulse: 65  Temp: 98.5 F (36.9 C)  Resp: 18   Exam:  General: alert & oriented x 3 In NAD  Cardiovascular: RRR, nl S1 s2  Respiratory: clear, no wheezes and no crackles.  Abdomen: soft +BS NT/ND, no masses palpable  Extremities: No cyanosis and no edema   Discharge Instructions  Discharge Orders   Future Orders Complete By Expires   Diet Carb Modified  As directed    Increase activity slowly  As directed        Medication List    STOP taking these medications       levofloxacin 500 MG tablet  Commonly known as:  LEVAQUIN     naproxen sodium 220 MG tablet  Commonly known as:  ANAPROX      TAKE these medications       albuterol 108 (90 BASE) MCG/ACT inhaler  Commonly known as:  PROVENTIL HFA;VENTOLIN HFA  Inhale 2 puffs into the lungs every 6 (six) hours as needed for wheezing or shortness of breath.     albuterol (2.5 MG/3ML) 0.083% nebulizer solution  Commonly known as:  PROVENTIL  Inhale 3 mLs into the lungs every 6 (six) hours as needed for wheezing or shortness of breath.     ALIGN 4 MG Caps  Take 1 capsule by mouth daily.     amoxicillin-clavulanate 875-125 MG per tablet  Commonly known as:  AUGMENTIN  Take 1 tablet by mouth 2 (two) times daily.     benzonatate 100 MG capsule  Commonly known as:  TESSALON  Take 1 capsule (100 mg total) by mouth 3 (three) times daily.     Calcium Carbonate 1500 MG Tabs  Take 1 tablet by mouth daily.     dextromethorphan-guaiFENesin 30-600 MG per 12 hr tablet  Commonly known as:  MUCINEX DM  Take 1 tablet by mouth 2 (two) times daily as needed for cough.     DULoxetine 30 MG capsule  Commonly known as:  CYMBALTA  Take 30 mg by mouth daily.     EPINEPHrine 0.15 MG/0.3ML injection  Commonly known as:  EPIPEN JR  Inject 0.15 mg into the muscle as needed.     furosemide 40 MG tablet  Commonly  known as:  LASIX  Take 40 mg by mouth daily as needed for fluid or edema.     metFORMIN 500 MG tablet  Commonly known as:  GLUCOPHAGE  Take 500 mg by mouth daily as needed (for FSBS over 120, will take as needed (under 120-doesn't take med)).     omeprazole 40 MG capsule  Commonly known as:  PRILOSEC  Take 1 capsule (40 mg total) by mouth daily.     predniSONE 20 MG tablet  Commonly known as:  DELTASONE  Take 2tabs daily for 3days then 1tab daily for 3days, then 1/2 tab for 3 days then stop     promethazine 25 MG tablet  Commonly known as:  PHENERGAN  Take 25 mg by mouth every 6 (six) hours as needed for nausea or vomiting.     simvastatin 80 MG tablet  Commonly known as:  ZOCOR  Take 80 mg by mouth every morning.  SYSTANE ULTRA 0.4-0.3 % Soln  Generic drug:  Polyethyl Glycol-Propyl Glycol  Apply 1-2 drops to eye daily.     Vitamin D (Ergocalciferol) 50000 UNITS Caps capsule  Commonly known as:  DRISDOL  Take 50,000 Units by mouth every 7 (seven) days. On Sundays       Allergies  Allergen Reactions  . Ambien [Zolpidem Tartrate] Other (See Comments)    Nightmares and insomnia  . Aspirin Other (See Comments)    GI bleed  . Benicar [Olmesartan Medoxomil] Shortness Of Breath, Swelling and Other (See Comments)    Swelling of throat  . Ketorolac Tromethamine Anaphylaxis  . Latex Shortness Of Breath and Itching  . Lisinopril Shortness Of Breath, Swelling and Other (See Comments)     Swelling of throat  . Other Anaphylaxis    Peaches and mangoes, tree nuts, tomatoes, shellfish  . Reclast [Zoledronic Acid] Other (See Comments)    Ocular reaction, toxic  . Toradol [Ketorolac Tromethamine] Anaphylaxis  . Codeine Nausea And Vomiting  . Erythromycin Nausea And Vomiting  . Fenofibrate Nausea And Vomiting  . Glucosamine Forte [Nutritional Supplements] Diarrhea and Nausea Only  . Levaquin [Levofloxacin In D5w] Diarrhea and Swelling  . Percocet [Oxycodone-Acetaminophen]  Nausea And Vomiting  . Tramadol Nausea And Vomiting  . Tizanidine Other (See Comments)    unknown       Follow-up Information   Follow up with Tivis Ringer, MD. (PCP in 1-2weeks, call for appt upon discharge)    Specialty:  Internal Medicine   Contact information:   [facility] [place] [address] [phone]       Follow up with Rigoberto Noel., MD. (Pulmonologist, call for appointment upon discharge)    Specialty:  Pulmonary Disease   Contact information:   [address] [place] [address] [phone]        The results of significant diagnostics from this hospitalization (including imaging, microbiology, ancillary and laboratory) are listed below for reference.    Significant Diagnostic Studies: Dg Chest 2 View  11/09/2013   CLINICAL DATA:  Shortness of breath.  EXAM: CHEST  2 VIEW  COMPARISON:  10/22/2010  FINDINGS: The cardiomediastinal silhouette is unchanged.  Mild scarring within the mid lungs again noted.  There is no evidence of focal airspace disease, pulmonary edema, suspicious pulmonary nodule/mass, pleural effusion, or pneumothorax. No acute bony abnormalities are identified.  IMPRESSION: No active cardiopulmonary disease.   Electronically Signed   By: Hassan Rowan M.D.   On: 11/09/2013 16:55   Ct Angio Chest Pe W/cm &/or Wo Cm  11/09/2013   CLINICAL DATA:  Dyspnea  EXAM: CT ANGIOGRAPHY CHEST WITH CONTRAST  TECHNIQUE: Multidetector CT imaging of the chest was performed using the standard protocol during bolus administration of intravenous contrast. Multiplanar CT image reconstructions and MIPs were obtained to evaluate the vascular anatomy.  CONTRAST:  133mL OMNIPAQUE IOHEXOL 350 MG/ML SOLN  COMPARISON:  Chest radiograph October 22, 2010  FINDINGS: There is no demonstrable pulmonary embolus. There is no thoracic aortic aneurysm or dissection.  There is extensive bronchiectatic change bilaterally. There is a focal pulmonary  nodular opacity in the medial segment of the right middle lobe measuring 0.8 x 0.7 cm, best seen on axial slice 60 series 6. There is widespread nodular interstitial disease with multiple 1 mm nodular opacities throughout the lungs somewhat diffusely with a lower lobe predominance. There is some patchy infiltrate in the left upper lobe peripherally. There is a well-defined 3 mm  nodular opacity in the superior segment left lower lobe seen on slice 74, series 6. There is a tiny calcified granuloma in the superior segment right lower lobe.  There is a focal hiatal hernia.  There is no appreciable thoracic adenopathy. There are small mediastinal lymph nodes which do not meet size criteria for pathologic significance. There are foci of coronary artery calcification. There is no pericardial effusion.  Visualized upper abdominal structures appear normal. There are no blastic or lytic bone lesions. There is mild inhomogeneity in the right lobe of the thyroid.  Review of the MIP images confirms the above findings.  IMPRESSION: No demonstrable pulmonary embolus. There is patchy infiltrate in the left upper lobe. There is nodular interstitial disease fairly diffusely of uncertain etiology.  There is an 8 mm nodular opacity in the right middle lobe as well as a 3 mm nodular opacity in the left lower lobe. Followup of these nodular opacity should be based on Fleischner Society guidelines. If the patient is at high risk for bronchogenic carcinoma, follow-up chest CT at 3-44months is recommended. If the patient is at low risk for bronchogenic carcinoma, follow-up chest CT at 6-12 months is recommended. This recommendation follows the consensus statement: Guidelines for Management of Small Pulmonary Nodules Detected on CT Scans: A Statement from the [employer] as published in Radiology 2005; 237:395-400.  There is fairly extensive bronchiectatic change bilaterally.  There is a focal hiatal hernia.  No adenopathy. There are  multiple foci of coronary artery calcification.   Electronically Signed   By: Lowella Grip M.D.   On: 11/09/2013 12:08    Microbiology: Recent Results (from the past 240 hour(s))  CULTURE, BLOOD (ROUTINE X 2)     Status: None   Collection Time    11/09/13  5:15 PM      Result Value Ref Range Status   Specimen Description BLOOD LEFT ARM   Final   Special Requests BOTTLES DRAWN AEROBIC AND ANAEROBIC 5CC   Final   Culture  Setup Time     Final   Value: 11/09/2013 22:38     Performed at [facility]   Culture     Final   Value:        BLOOD CULTURE RECEIVED NO GROWTH TO DATE CULTURE WILL BE HELD FOR 5 DAYS BEFORE ISSUING A FINAL NEGATIVE REPORT     Performed at [facility]   Report Status PENDING   Incomplete  CULTURE, BLOOD (ROUTINE X 2)     Status: None   Collection Time    11/09/13  5:25 PM      Result Value Ref Range Status   Specimen Description BLOOD LEFT HAND   Final   Special Requests BOTTLES DRAWN AEROBIC AND ANAEROBIC 5CC   Final   Culture  Setup Time     Final   Value: 11/09/2013 22:38     Performed at [facility]   Culture     Final   Value:        BLOOD CULTURE RECEIVED NO GROWTH TO DATE CULTURE WILL BE HELD FOR 5 DAYS BEFORE ISSUING A FINAL NEGATIVE REPORT     Performed at [facility]   Report Status PENDING   Incomplete  MRSA PCR SCREENING     Status: None   Collection Time    11/09/13  7:09 PM      Result Value Ref Range Status   MRSA by PCR NEGATIVE  NEGATIVE Final  Comment:            The GeneXpert MRSA Assay (FDA     approved for NASAL specimens     only), is one component of a     comprehensive MRSA colonization     surveillance program. It is not     intended to diagnose MRSA     infection nor to guide or     monitor treatment for     MRSA infections.  CULTURE, EXPECTORATED SPUTUM-ASSESSMENT     Status: None   Collection Time    11/10/13  1:42 AM      Result Value Ref Range Status   Specimen Description SPUTUM    Final   Special Requests Normal   Final   Sputum evaluation     Final   Value: THIS SPECIMEN IS ACCEPTABLE. RESPIRATORY CULTURE REPORT TO FOLLOW.   Report Status 11/10/2013 FINAL   Final  CULTURE, RESPIRATORY (NON-EXPECTORATED)     Status: None   Collection Time    11/10/13  1:42 AM      Result Value Ref Range Status   Specimen Description SPUTUM   Final   Special Requests NONE   Final   Gram Stain     Final   Value: ABUNDANT WBC PRESENT, PREDOMINANTLY PMN     RARE SQUAMOUS EPITHELIAL CELLS PRESENT     RARE GRAM POSITIVE COCCI     IN PAIRS RARE GRAM VARIABLE ROD     Performed at [facility]   Culture     Final   Value: NORMAL OROPHARYNGEAL FLORA     Performed at [facility]   Report Status 11/12/2013 FINAL   Final  URINE CULTURE     Status: None   Collection Time    11/10/13  6:14 AM      Result Value Ref Range Status   Specimen Description URINE, CLEAN CATCH   Final   Special Requests Normal   Final   Culture  Setup Time     Final   Value: 11/10/2013 08:05     Performed at [facility]     Final   Value: NO GROWTH     Performed at [facility]   Culture     Final   Value: NO GROWTH     Performed at [facility]   Report Status 11/12/2013 FINAL   Final     Labs: Basic Metabolic Panel:  Recent Labs Lab 11/09/13 1434 11/10/13 0258 11/12/13 1128  NA 140 142 142  K 4.4 4.5 3.9  CL 98 104 104  CO2 25 25 23   GLUCOSE 149* 181* 183*  BUN 19 23 32*  CREATININE 1.35* 1.47* 1.03  CALCIUM 9.7 9.4 9.2   Liver Function Tests: No results found for this basename: AST, ALT, ALKPHOS, BILITOT, PROT, ALBUMIN,  in the last 168 hours No results found for this basename: LIPASE, AMYLASE,  in the last 168 hours No results found for this basename: AMMONIA,  in the last 168 hours CBC:  Recent Labs Lab 11/09/13 1434 11/10/13 0258  WBC 9.1 8.2  HGB 13.2 12.1  HCT 39.8 36.5  MCV 91.7 92.6  PLT 254 218   Cardiac  Enzymes: No results found for this basename: CKTOTAL, CKMB, CKMBINDEX, TROPONINI,  in the last 168 hours BNP: BNP (last 3 results)  Recent Labs  11/09/13 1434  PROBNP 22.1   CBG:  Recent Labs Lab 11/12/13 0745 11/12/13  1132 11/12/13 1625 11/12/13 2135 11/13/13 0819  GLUCAP 194* 182* 240* 158* 130*       Signed:  Araiyah Cumpton C  [facility] 11/13/2013, 12:52 PM

## 2013-11-15 LAB — CULTURE, BLOOD (ROUTINE X 2)
CULTURE: NO GROWTH
Culture: NO GROWTH

## 2013-11-30 ENCOUNTER — Telehealth: Payer: Self-pay | Admitting: Pulmonary Disease

## 2013-11-30 NOTE — Telephone Encounter (Signed)
Spoke w/ pt. Advised her so far RA is only in the office tomorrow afternoon for the rest of the month. He is not in the [facility] office either. Offered pt another appt with another provider and refused. She reports she will contact PCP to be seen in the meantime. Will sign off message

## 2013-12-29 ENCOUNTER — Ambulatory Visit (INDEPENDENT_AMBULATORY_CARE_PROVIDER_SITE_OTHER): Payer: Medicare Other | Admitting: Pulmonary Disease

## 2013-12-29 ENCOUNTER — Encounter: Payer: Self-pay | Admitting: Pulmonary Disease

## 2013-12-29 VITALS — BP 128/78 | HR 81 | Ht 63.0 in | Wt 163.4 lb

## 2013-12-29 DIAGNOSIS — J471 Bronchiectasis with (acute) exacerbation: Secondary | ICD-10-CM

## 2013-12-29 DIAGNOSIS — R918 Other nonspecific abnormal finding of lung field: Secondary | ICD-10-CM

## 2013-12-29 DIAGNOSIS — R1314 Dysphagia, pharyngoesophageal phase: Secondary | ICD-10-CM

## 2013-12-29 DIAGNOSIS — J479 Bronchiectasis, uncomplicated: Secondary | ICD-10-CM | POA: Insufficient documentation

## 2013-12-29 NOTE — Patient Instructions (Addendum)
You have bronchiectasis Small nodule needs FU CT scan in 6 months Lung function is good OK to stop dulera Use albuterol nebs as needed only & if not, then STOP & return  nebuliser Wonder if these issues are related to esophageal problem

## 2013-12-29 NOTE — Assessment & Plan Note (Signed)
Small nodule needs FU CT scan in 6 months Low risk for malignancy in this never smoker, more likely related to underlying infectious/inflammatory process

## 2013-12-29 NOTE — Assessment & Plan Note (Addendum)
Lung function is good OK to stop dulera Use albuterol nebs as needed only & if not, then STOP & return  nebuliser Wonder if these issues are related to esophageal problem Observation for now- may need prolonged abx duration if recurrent flare Etiology of bronchiectasis is unclear-may be related to childhood illness, subclinical clinical aspiration, MAC remains a possibility.

## 2013-12-29 NOTE — Progress Notes (Signed)
Subjective:    Patient ID: Tiffany [name], female    DOB: 03/11/1951, 63 y.o.   MRN: [id number]  HPI 63 year old, retired [employer] scientist, never smoker presents for evaluation of abnormal imaging showing bronchiectasis and pulmonary nodule. She developed cough, dyspnea and fatigue since December 2014, received 2 different rounds of antibiotics and was admitted 3/17-3/21/15 for hypoxia and bronchospasm. Chest x-ray did not show any infiltrates.CT angiogram chest was negative for pulmonary embolism, showed extensive bronchiectatic changes bilaterally, with nodularity and interstitial prominence in both lower lobes. There was an 8 mm nodule in the right middle lobe which was the largest. There was a faint infiltrate in the left upper lobe. She improved with IV antibiotics and was discharged on dulera and albuterol nebs. Her wheezing and dyspnea has subsided but she still has a dry cough, this does suppress with Mucinex DM and Claritin.   Her past medical history significant for spina bifida, Nissen's fundoplication in the 03K,[id number] infusion reaction in 2012, hypertension and diabetes  01/2008 UGI series showed Achalasia-like appearance of the distal esophagus, s/o  stricture of the distal esophagus  Spirometry showed no evidence of airway obstruction normal lung function  Past Medical History  Diagnosis Date  . Diabetes mellitus   . Kidney stones   . Meningitis   . Hypertension   . GERD (gastroesophageal reflux disease)   . Hiatal hernia   . Gastritis   . IBS (irritable bowel syndrome)   . Depression   . Kidney stones   . Colon polyps   . Spina bifida occulta   . Insomnia   . Allergy   . Arthritis   . Cataract     REMOVED  . Hyperlipidemia   . Osteoporosis   . Ulcer     Past Surgical History  Procedure Laterality Date  . Back surgery    . Abdominal hysterectomy    . Tonsillectomy    . Polypectomy    . Meniscus repair    . Fundoplication    . Lithotripsy    . Elbow  surgery      left  . Cataract extraction  03/2012    bilateral  . Colonoscopy     Allergies  Allergen Reactions  . Ambien [Zolpidem Tartrate] Other (See Comments)    Nightmares and insomnia  . Aspirin Other (See Comments)    GI bleed  . Benicar [Olmesartan Medoxomil] Shortness Of Breath, Swelling and Other (See Comments)    Swelling of throat  . Ketorolac Tromethamine Anaphylaxis  . Latex Shortness Of Breath and Itching  . Lisinopril Shortness Of Breath, Swelling and Other (See Comments)     Swelling of throat  . Other Anaphylaxis    Peaches and mangoes, tree nuts, tomatoes, shellfish  . Reclast [Zoledronic Acid] Other (See Comments)    Ocular reaction, toxic  . Toradol [Ketorolac Tromethamine] Anaphylaxis  . Codeine Nausea And Vomiting  . Erythromycin Nausea And Vomiting  . Fenofibrate Nausea And Vomiting  . Glucosamine Forte [Nutritional Supplements] Diarrhea and Nausea Only  . Levaquin [Levofloxacin In D5w] Diarrhea and Swelling  . Percocet [Oxycodone-Acetaminophen] Nausea And Vomiting  . Tramadol Nausea And Vomiting  . Tizanidine Other (See Comments)    unknown    History   Social History  . Marital Status: Married    Spouse Name: N/A    Number of Children: 2  . Years of Education: N/A   Occupational History  . Retired     Corporate treasurer   Social History Main Topics  .  Smoking status: Never Smoker   . Smokeless tobacco: Never Used  . Alcohol Use: No     Comment: OCC.  . Drug Use: No  . Sexual Activity: Not on file   Other Topics Concern  . Not on file   Social History Narrative  . No narrative on file    Family History  Problem Relation Age of Onset  . Alzheimer's disease Father   . Irritable bowel syndrome Father   . Ulcers Father   . Gout Father   . Cancer Mother     ?  . Diabetes Mother   . Hypertension Mother   . Nephrolithiasis Mother   . Colon cancer Mother   . Ovarian cancer Mother   . Coronary artery disease      grandparent  . Thyroid  cancer Sister   . Nephrolithiasis Brother   . Colon cancer Maternal Grandmother       Review of Systems  Constitutional: Negative for fever and unexpected weight change.  HENT: Positive for sore throat. Negative for congestion, dental problem, ear pain, nosebleeds, postnasal drip, rhinorrhea, sinus pressure, sneezing and trouble swallowing.   Eyes: Negative for redness and itching.  Respiratory: Positive for cough and shortness of breath. Negative for chest tightness and wheezing.   Cardiovascular: Positive for chest pain. Negative for palpitations and leg swelling.  Gastrointestinal: Negative for nausea and vomiting.  Genitourinary: Negative for dysuria.  Musculoskeletal: Negative for joint swelling.  Skin: Negative for rash.  Neurological: Positive for headaches.  Hematological: Does not bruise/bleed easily.  Psychiatric/Behavioral: Positive for dysphoric mood. The patient is nervous/anxious.        Objective:   Physical Exam   Gen. Pleasant, well-nourished, in no distress, normal affect ENT - no lesions, no post nasal drip Neck: No JVD, no thyromegaly, no carotid bruits Lungs: no use of accessory muscles, no dullness to percussion, clear without rales or rhonchi  Cardiovascular: Rhythm regular, heart sounds  normal, no murmurs or gallops, no peripheral edema Abdomen: soft and non-tender, no hepatosplenomegaly, BS normal. Musculoskeletal: No deformities, no cyanosis or clubbing Neuro:  alert, non focal        Assessment & Plan:

## 2013-12-29 NOTE — Assessment & Plan Note (Signed)
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 If symptoms persist may need followup with Dr. Henrene Pastor

## 2014-03-18 ENCOUNTER — Other Ambulatory Visit: Payer: Self-pay

## 2014-03-18 DIAGNOSIS — Z1231 Encounter for screening mammogram for malignant neoplasm of breast: Secondary | ICD-10-CM

## 2014-03-31 ENCOUNTER — Ambulatory Visit
Admission: RE | Admit: 2014-03-31 | Discharge: 2014-03-31 | Disposition: A | Discharge status: Home / Self Care | Payer: Medicare Other | Source: Ambulatory Visit

## 2014-03-31 DIAGNOSIS — Z1231 Encounter for screening mammogram for malignant neoplasm of breast: Secondary | ICD-10-CM

## 2014-05-10 ENCOUNTER — Telehealth: Payer: Self-pay | Admitting: Pulmonary Disease

## 2014-05-10 NOTE — Telephone Encounter (Signed)
Pt aware that I will send message to [facility] to get scheduled-order already placed for 06-2014 and then have message sent to RA to advise how soon after the CT scan he wants patient to follow up.    PCC's please advise and then let us know so we can send message to RA to advise. Thanks

## 2014-05-11 NOTE — Telephone Encounter (Signed)
Scheduled ct chest 07/01/14@10 :00am @[facility]  pt has flu app to see dr Elsworth Soho the next week Tiffany [name]

## 2014-07-01 ENCOUNTER — Ambulatory Visit (INDEPENDENT_AMBULATORY_CARE_PROVIDER_SITE_OTHER)
Admission: RE | Admit: 2014-07-01 | Discharge: 2014-07-01 | Disposition: A | Discharge status: Home / Self Care | Payer: Medicare Other | Source: Ambulatory Visit | Attending: Pulmonary Disease | Admitting: Pulmonary Disease

## 2014-07-01 DIAGNOSIS — R918 Other nonspecific abnormal finding of lung field: Secondary | ICD-10-CM

## 2014-07-05 ENCOUNTER — Ambulatory Visit (INDEPENDENT_AMBULATORY_CARE_PROVIDER_SITE_OTHER): Payer: Medicare Other | Admitting: Pulmonary Disease

## 2014-07-05 ENCOUNTER — Encounter: Payer: Self-pay | Admitting: Pulmonary Disease

## 2014-07-05 VITALS — BP 152/84 | HR 107 | Ht 63.0 in | Wt 165.2 lb

## 2014-07-05 DIAGNOSIS — R0683 Snoring: Secondary | ICD-10-CM | POA: Insufficient documentation

## 2014-07-05 DIAGNOSIS — K222 Esophageal obstruction: Secondary | ICD-10-CM

## 2014-07-05 DIAGNOSIS — Z23 Encounter for immunization: Secondary | ICD-10-CM

## 2014-07-05 DIAGNOSIS — J479 Bronchiectasis, uncomplicated: Secondary | ICD-10-CM

## 2014-07-05 MED ORDER — IPRATROPIUM BROMIDE 0.03 % NA SOLN: 2.0000 | NASAL | Status: DC | PRN

## 2014-07-05 NOTE — Progress Notes (Signed)
   Subjective:    Patient ID: Tiffany [name], female    DOB: 21-Mar-1951, 63 y.o.   MRN: [id number]  HPI   63 year old, retired [employer] scientist, never smoker presents for FU of bronchiectasis and pulmonary nodule. She developed cough, dyspnea and fatigue since December 2014, received 2 different rounds of antibiotics and was admitted 3/17-3/21/15 for hypoxia and bronchospasm. Chest x-ray did not show any infiltrates.CT angiogram chest was negative for pulmonary embolism, showed extensive bronchiectatic changes bilaterally, with nodularity and interstitial prominence in both lower lobes. There was an 8 mm nodule in the right middle lobe which was the largest. There was a faint infiltrate in the left upper lobe. She improved with IV antibiotics and was discharged on dulera and albuterol nebs. Her wheezing and dyspnea has subsided but she still has a dry cough, this does suppress with Mucinex DM and Claritin.   Her past medical history significant for spina bifida, Nissen's fundoplication in the 15B,WIOMBTDHRCBULA infusion reaction in 2012, hypertension and diabetes  01/2008 UGI series showed Achalasia-like appearance of the distal esophagus, s/o stricture of the distal esophagus  Spirometry showed no evidence of airway obstruction normal lung function  07/05/2014  Chief Complaint  Patient presents with  . Follow-up    F/U Pulmonary Nodules; CT scan done 07/01/14   CT 06/2014 Complete resolution of multifocal peribronchovascular micronodularity noted on the prior study, indicating that the findings were presumably related to bronchitis and early multilobar bronchopneumonia on the prior examination. The small 3 mm left lower lobe nodule noted on the prior examination has completely resolved. The larger nodule in the right lung (inferior aspect of the right upper lobe abutting the minor fissure) is much smaller  Last saw dr Henrene Pastor in 08/2012 - had esophageal dilatation in the past  Husband c/o loud snoring  which has caused him to leave the bedroom ESS 1/24 Bedtime 11p, latency 51mins, no noct awakenings, oob by 10A, no am headaches or dryness of mouth There is no history suggestive of cataplexy, sleep paralysis or parasomnias      Review of Systems neg for any significant sore throat, dysphagia, itching, sneezing, nasal congestion or excess/ purulent secretions, fever, chills, sweats, unintended wt loss, pleuritic or exertional cp, hempoptysis, orthopnea pnd or change in chronic leg swelling. Also denies presyncope, palpitations, heartburn, abdominal pain, nausea, vomiting, diarrhea or change in bowel or urinary habits, dysuria,hematuria, rash, arthralgias, visual complaints, headache, numbness weakness or ataxia.     Objective:   Physical Exam  Gen. Pleasant, well-nourished, in no distress, normal affect ENT - no lesions, no post nasal drip Neck: No JVD, no thyromegaly, no carotid bruits Lungs: no use of accessory muscles, no dullness to percussion, clear without rales or rhonchi  Cardiovascular: Rhythm regular, heart sounds  normal, no murmurs or gallops, no peripheral edema Abdomen: soft and non-tender, no hepatosplenomegaly, BS normal. Musculoskeletal: No deformities, no cyanosis or clubbing Neuro:  alert, non focal       Assessment & Plan:

## 2014-07-05 NOTE — Patient Instructions (Signed)
Nodules have resolved on CT scan You do have bronchiectasis Call us if you have a bad chest cold/ bronchitis Flu shot today For snoring, Trial of atrovent spray each nare at bedtime If persists, try position therapy (tennis ball ) & call us to set up sleep study

## 2014-07-05 NOTE — Assessment & Plan Note (Signed)
Findings c/w aspiration pna - suspect this is also the cause of her bronchiectasis

## 2014-07-05 NOTE — Assessment & Plan Note (Signed)
For snoring, Trial of atrovent spray each nare at bedtime If persists, try position therapy (tennis ball ) & call us to set up sleep study

## 2014-07-05 NOTE — Assessment & Plan Note (Signed)
Nodules have resolved on CT scan You do have bronchiectasis Call us if you have a bad chest cold/ bronchitis Flu shot today

## 2014-07-05 NOTE — Addendum Note (Signed)
Addended by: Mathis Dad on: 07/05/2014 04:52 PM   Modules accepted: Orders

## 2014-08-01 ENCOUNTER — Telehealth: Payer: Self-pay | Admitting: Pulmonary Disease

## 2014-08-01 MED ORDER — AZITHROMYCIN 250 MG PO TABS: ORAL_TABLET | ORAL | Status: DC

## 2014-08-01 NOTE — Telephone Encounter (Signed)
Patient calling back about the same.  [phone]

## 2014-08-01 NOTE — Telephone Encounter (Signed)
Pt advised and rx sent to hold at pharmacy. Ashley Bing, CMA

## 2014-08-01 NOTE — Telephone Encounter (Signed)
Pt states she is having sore throat, losing her voice, achy feelings without any documented fevers, with upper chest congestion. Pt had headache for 2 days as well. Pt has tired Neosynpherine, Claritin, and aleve with no help.    Pharmacy: [facility]   Allergies  Allergen Reactions  . Ambien [Zolpidem Tartrate] Other (See Comments)    Nightmares and insomnia  . Aspirin Other (See Comments)    GI bleed  . Benicar [Olmesartan Medoxomil] Shortness Of Breath, Swelling and Other (See Comments)    Swelling of throat  . Ketorolac Tromethamine Anaphylaxis  . Latex Shortness Of Breath and Itching  . Lisinopril Shortness Of Breath, Swelling and Other (See Comments)     Swelling of throat  . Other Anaphylaxis    Peaches and mangoes, tree nuts, tomatoes, shellfish  . Reclast [Zoledronic Acid] Other (See Comments)    Ocular reaction, toxic  . Toradol [Ketorolac Tromethamine] Anaphylaxis  . Codeine Nausea And Vomiting  . Erythromycin Nausea And Vomiting  . Fenofibrate Nausea And Vomiting  . Glucosamine Forte [Nutritional Supplements] Diarrhea and Nausea Only  . Levaquin [Levofloxacin In D5w] Diarrhea and Swelling  . Percocet [Oxycodone-Acetaminophen] Nausea And Vomiting  . Tramadol Nausea And Vomiting  . Tizanidine Other (See Comments)    unknown    RA please advise. Thanks.

## 2014-08-01 NOTE — Telephone Encounter (Signed)
Sounds like a viral infection - take tylenol for fever, mucinex for drainage, can take decongestant as needed Z-pak if sputum changes color

## 2014-08-01 NOTE — Telephone Encounter (Signed)
Pt advised that message has been sent to RA. Pt states that she is very achy now, eyes are hurting and she is having increased chills. No fever present.

## 2014-09-26 ENCOUNTER — Other Ambulatory Visit (HOSPITAL_COMMUNITY): Payer: Self-pay | Admitting: Orthopaedic Surgery

## 2014-09-29 NOTE — Pre-Procedure Instructions (Signed)
Tiffany [name]  09/29/2014   Your procedure is scheduled on:  Tuesday, February 16th  Report to [facility] Admitting at 11 AM.  Call this number if you have problems the morning of surgery: [phone]   Remember:   Do not eat food or drink liquids after midnight.   Take these medicines the morning of surgery with A SIP OF WATER: cymbalta, prilosec, claritin, tylenol if needed   Do not wear jewelry, make-up or nail polish.  Do not wear lotions, powders, or perfume,deodorant.  Do not shave 48 hours prior to surgery. Men may shave face and neck.  Do not bring valuables to the hospital.  [facility] is not responsible for any belongings or valuables.               Contacts, dentures or bridgework may not be worn into surgery.  Leave suitcase in the car. After surgery it may be brought to your room.  For patients admitted to the hospital, discharge time is determined by your treatment team.     Please read over the following fact sheets that you were given: Pain Booklet, Coughing and Deep Breathing, MRSA Information and Surgical Site Infection Prevention  [name] - Preparing for Surgery  Before surgery, you can play an important role.  Because skin is not sterile, your skin needs to be as free of germs as possible.  You can reduce the number of germs on you skin by washing with CHG (chlorahexidine gluconate) soap before surgery.  CHG is an antiseptic cleaner which kills germs and bonds with the skin to continue killing germs even after washing.  Please DO NOT use if you have an allergy to CHG or antibacterial soaps.  If your skin becomes reddened/irritated stop using the CHG and inform your nurse when you arrive at Short Stay.  Do not shave (including legs and underarms) for at least 48 hours prior to the first CHG shower.  You may shave your face.  Please follow these instructions carefully:   1.  Shower with CHG Soap the night before surgery and the morning of  Surgery.  2.  If you choose to wash your hair, wash your hair first as usual with your normal shampoo.  3.  After you shampoo, rinse your hair and body thoroughly to remove the shampoo.  4.  Use CHG as you would any other liquid soap.  You can apply CHG directly to the skin and wash gently with scrungie or a clean washcloth.  5.  Apply the CHG Soap to your body ONLY FROM THE NECK DOWN.  Do not use on open wounds or open sores.  Avoid contact with your eyes, ears, mouth and genitals (private parts).  Wash genitals (private parts) with your normal soap.  6.  Wash thoroughly, paying special attention to the area where your surgery will be performed.  7.  Thoroughly rinse your body with warm water from the neck down.  8.  DO NOT shower/wash with your normal soap after using and rinsing off the CHG Soap.  9.  Pat yourself dry with a clean towel.            10.  Wear clean pajamas.            11.  Place clean sheets on your bed the night of your first shower and do not sleep with pets.  Day of Surgery  Do not apply any lotions/deoderants the morning of surgery.  Please  wear clean clothes to the hospital/surgery center.

## 2014-09-30 ENCOUNTER — Encounter (HOSPITAL_COMMUNITY)
Admission: RE | Admit: 2014-09-30 | Discharge: 2014-09-30 | Disposition: A | Discharge status: Home / Self Care | Payer: Medicare Other | Source: Ambulatory Visit | Attending: Orthopaedic Surgery | Admitting: Orthopaedic Surgery

## 2014-09-30 ENCOUNTER — Encounter (HOSPITAL_COMMUNITY): Payer: Self-pay

## 2014-09-30 DIAGNOSIS — R42 Dizziness and giddiness: Secondary | ICD-10-CM | POA: Insufficient documentation

## 2014-09-30 DIAGNOSIS — E119 Type 2 diabetes mellitus without complications: Secondary | ICD-10-CM | POA: Diagnosis not present

## 2014-09-30 DIAGNOSIS — R0789 Other chest pain: Secondary | ICD-10-CM | POA: Diagnosis not present

## 2014-09-30 DIAGNOSIS — I10 Essential (primary) hypertension: Secondary | ICD-10-CM | POA: Insufficient documentation

## 2014-09-30 DIAGNOSIS — M179 Osteoarthritis of knee, unspecified: Secondary | ICD-10-CM | POA: Diagnosis not present

## 2014-09-30 DIAGNOSIS — Z01812 Encounter for preprocedural laboratory examination: Secondary | ICD-10-CM | POA: Diagnosis present

## 2014-09-30 DIAGNOSIS — K219 Gastro-esophageal reflux disease without esophagitis: Secondary | ICD-10-CM | POA: Diagnosis not present

## 2014-09-30 DIAGNOSIS — E785 Hyperlipidemia, unspecified: Secondary | ICD-10-CM | POA: Insufficient documentation

## 2014-09-30 DIAGNOSIS — Q760 Spina bifida occulta: Secondary | ICD-10-CM | POA: Insufficient documentation

## 2014-09-30 HISTORY — DX: Pneumonia, unspecified organism: J18.9

## 2014-09-30 HISTORY — DX: Malignant (primary) neoplasm, unspecified: C80.1

## 2014-09-30 HISTORY — DX: Unspecified retinal disorder: H35.9

## 2014-09-30 HISTORY — DX: Chronic cough: R05.3

## 2014-09-30 HISTORY — DX: Bronchiectasis, uncomplicated: J47.9

## 2014-09-30 HISTORY — DX: Dysphagia, unspecified: R13.10

## 2014-09-30 HISTORY — DX: Anxiety disorder, unspecified: F41.9

## 2014-09-30 HISTORY — DX: Cardiac arrhythmia, unspecified: I49.9

## 2014-09-30 HISTORY — DX: Cough: R05

## 2014-09-30 HISTORY — DX: Angina pectoris, unspecified: I20.9

## 2014-09-30 HISTORY — DX: Reserved for inherently not codable concepts without codable children: IMO0001

## 2014-09-30 LAB — CBC
HEMATOCRIT: 37.2 % (ref 36.0–46.0)
Hemoglobin: 12.4 g/dL (ref 12.0–15.0)
MCH: 29.7 pg (ref 26.0–34.0)
MCHC: 33.3 g/dL (ref 30.0–36.0)
MCV: 89.2 fL (ref 78.0–100.0)
Platelets: 167 10*3/uL (ref 150–400)
RBC: 4.17 MIL/uL (ref 3.87–5.11)
RDW: 12.5 % (ref 11.5–15.5)
WBC: 7 10*3/uL (ref 4.0–10.5)

## 2014-09-30 LAB — BASIC METABOLIC PANEL
ANION GAP: 7 (ref 5–15)
BUN: 17 mg/dL (ref 6–23)
CALCIUM: 9.8 mg/dL (ref 8.4–10.5)
CHLORIDE: 107 mmol/L (ref 96–112)
CO2: 27 mmol/L (ref 19–32)
CREATININE: 1.05 mg/dL (ref 0.50–1.10)
GFR calc Af Amer: 65 mL/min — ABNORMAL LOW (ref >90)
GFR, EST NON AFRICAN AMERICAN: 56 mL/min — AB (ref >90)
GLUCOSE: 96 mg/dL (ref 70–99)
POTASSIUM: 4.1 mmol/L (ref 3.5–5.1)
Sodium: 141 mmol/L (ref 135–145)

## 2014-09-30 LAB — SURGICAL PCR SCREEN
MRSA, PCR: NEGATIVE
STAPHYLOCOCCUS AUREUS: NEGATIVE

## 2014-09-30 LAB — GLUCOSE, CAPILLARY: Glucose-Capillary: 79 mg/dL (ref 70–99)

## 2014-09-30 NOTE — Progress Notes (Addendum)
Anesthesia consult:  Pt is 64 year old female scheduled for R total knee arthroplasty on 10/11/2014 with Dr. Ninfa Linden.   PCP is Dr. Dagmar Hait.   PMH includes: HTN (hx taking bp meds several years ago, then bp improved and she stopped them; recently had physical in January, systolic bp found to be in 200's, pt started on metoprolol 09/29/2014), dysrhythmia, hyperlipidemia, DM, bronchiectasis, meningitis, spina bifida occulta, GERD  Pt reports having episodes since November 2015 of "feeling like I'm going to pass out".  Denies that it is dizziness. Reports she has had vertigo and "I know what dizziness feels like- this is different".  "Feeling like I'm going to pass out" episodes happen while standing, sitting or driving, do not occur with position changes. Pt also reports having episodes of chest tightness across B chest associated with tachycardia several times a week since December 2015. Episodes occur at rest, day or night. Episodes will awaken pt from sleep. When experiences chest tightness, pt will check pulse and find it to be in 130's. Episodes last about 20 minutes. There is nothing pt can do to help resolve episodes, she has to wait for them to pass on their own.   CV: RRR today. Lungs CTA B.   Preoperative labs reviewed.  HgbA1c from PCP's office dated 08/22/2014 was 5.6.   EKG 09/29/2014 done at PCP's office: NSR  Nuclear stress test 10/23/2010: Normal myocardial perfusion examination. No evidence for ischemia or reversible defect.  Discussed with Dr. Ermalene Postin. Pt will need cardiac clearance prior to surgery. Sherrie in Dr. Trevor Mace office notified.   Willeen Cass, FNP-BC [facility] Short Stay Surgical Center/Anesthesiology Phone: [phone] 09/30/2014 4:26 PM  Pt has cardiac clearance from Dr. Harrington Challenger in [place] note dated 10/07/2014.   Willeen Cass, FNP-BC [facility] Short Stay Surgical Center/Anesthesiology Phone: [phone] 10/10/2014 2:41 PM

## 2014-09-30 NOTE — Progress Notes (Addendum)
NOTIFIED ANGELA  PA OF PATIENT STATING SHE HAS HEART PALPITATIONS, INCREASED BP, CHEST TIGHTNESS (PATIENT WAS PLACED ON METOPROLOL 09-29-14 BY HER PCP).   REQUESTED HGBA1C and EKG  FROM DR. Dagmar Hait.  [phone]

## 2014-10-07 ENCOUNTER — Encounter: Payer: Self-pay | Admitting: Internal Medicine

## 2014-10-07 ENCOUNTER — Ambulatory Visit (INDEPENDENT_AMBULATORY_CARE_PROVIDER_SITE_OTHER): Payer: Medicare Other | Admitting: Internal Medicine

## 2014-10-07 VITALS — BP 138/92 | HR 72 | Ht 63.0 in | Wt 163.0 lb

## 2014-10-07 DIAGNOSIS — Z01818 Encounter for other preprocedural examination: Secondary | ICD-10-CM

## 2014-10-07 NOTE — Progress Notes (Signed)
Cardiology Office Note   Date:  10/07/2014   ID:  Tiffany, [name] 1950-12-23, MRN [id number]  PCP:  Tivis Ringer, MD  Cardiologist:   Dorris Carnes, MD   No chief complaint on file. [name] is referred for evaluation of preop risk evaluation  Patinet to have knee surgery    History of Present Illness: Tiffany [name] is a 64 y.o. female with no known CAD  She is followed by R Avva  She has had some head aches.  Also has felt her heart racing at times with some pressure  Not with activity.  Seen by R Avva  BP noted to be high  She was recently started on Toprol Xl  Since starting she says she has felt better  HA are gone  No heart racing   She denies CP  Now  No dizziness   Activity is limited by knee problems         Current Outpatient Prescriptions  Medication Sig Dispense Refill  . acetaminophen (TYLENOL) 500 MG tablet Take 1,000 mg by mouth every 8 (eight) hours as needed for mild pain or moderate pain.    . Aflibercept (EYLEA IO) Inject into Tiffany eye every 3 (three) months.    . Calcium Carbonate-Vit D-Min (CALCIUM 1200 PO) Take 1 tablet by mouth daily.    . Cholecalciferol (VITAMIN D) 2000 UNITS CAPS Take 2 capsules by mouth daily.    . diazepam (VALIUM) 10 MG tablet Take 10 mg by mouth at bedtime.    . diphenhydrAMINE (BENADRYL) 25 mg capsule Take 25 mg by mouth daily as needed for allergies.     . DULoxetine (CYMBALTA) 30 MG capsule Take 30 mg by mouth daily.    Marland Kitchen EPINEPHrine (EPIPEN JR) 0.15 MG/0.3ML injection Inject 0.15 mg into Tiffany muscle as needed.    Marland Kitchen EPIPEN 2-PAK 0.3 MG/0.3ML SOAJ injection as needed.  0  . furosemide (LASIX) 40 MG tablet Take 40 mg by mouth daily as needed for fluid or edema.     Marland Kitchen loratadine (CLARITIN) 10 MG tablet Take 10 mg by mouth daily as needed for allergies.     . metFORMIN (GLUCOPHAGE) 500 MG tablet Take 500 mg by mouth daily with breakfast.     . metoprolol succinate (TOPROL-XL) 25 MG 24 hr tablet Take 25 mg by mouth daily.    Marland Kitchen  omeprazole (PRILOSEC) 20 MG capsule Take 20 mg by mouth daily.    . ONE TOUCH ULTRA TEST test strip as needed.  1  . ONETOUCH DELICA LANCETS 98X MISC as directed.  1  . Polyethyl Glycol-Propyl Glycol (SYSTANE ULTRA) 0.4-0.3 % SOLN Apply 1-2 drops to eye daily.    . Probiotic Product (ALIGN) 4 MG CAPS Take 1 capsule by mouth daily as needed (IBS).     Marland Kitchen promethazine (PHENERGAN) 25 MG tablet Take 25 mg by mouth every 6 (six) hours as needed for nausea or vomiting.     . simvastatin (ZOCOR) 80 MG tablet Take 80 mg by mouth every morning.     . tobramycin (TOBREX) 0.3 % ophthalmic solution Place 1 drop into Tiffany right eye every 6 (six) hours. Tiffany day before, Tiffany day of, and Tiffany after procedure.     No current facility-administered medications for this visit.    Allergies:   Ambien; Aspirin; Benicar; Ketorolac tromethamine; Latex; Lisinopril; Other; Reclast; Toradol; Codeine; Erythromycin; Fenofibrate; Glucosamine forte; Levaquin; Percocet; Tramadol; and Tizanidine   Past Medical History  Diagnosis Date  .  Diabetes mellitus   . Meningitis   . Hypertension   . GERD (gastroesophageal reflux disease)   . Hiatal hernia   . Gastritis   . IBS (irritable bowel syndrome)   . Depression   . Colon polyps   . Spina bifida occulta   . Insomnia   . Allergy   . Cataract     REMOVED  . Hyperlipidemia   . Osteoporosis   . Ulcer   . Dysrhythmia     RAPID HEART BEAT , CHEST TIGHTNESS  09/29/14     . Pneumonia     BRONCHIECTASIS  . Chronic cough   . Dysphagia   . Shortness of breath dyspnea     WITH EXERTION  . Bronchiectasis   . Macular disorder     MACULAR DISEASE  RT EYE   . Anxiety   . Cancer     SKIN    . Kidney stones     KIDNEY DISEASE   STAGE lll    . Kidney stones   . Arthritis     foot drop  lt foot  / toes   wears brace     . Anginal pain     Past Surgical History  Procedure Laterality Date  . Back surgery    . Abdominal hysterectomy    . Tonsillectomy    . Polypectomy      . Meniscus repair    . Fundoplication    . Lithotripsy    . Elbow surgery      left  . Cataract extraction  03/2012    bilateral  . Colonoscopy    . Knee arthroscopy      rt knee   . Eardrum      left   ruptured and repaired     Social History:  Tiffany [name]  reports that she has never smoked. She has never used smokeless tobacco. She reports that she does not drink alcohol or use illicit drugs.   Family History:  Tiffany [name]'s family history includes Alzheimer's disease in her father; Cancer in her mother; Colon cancer in her maternal grandmother and mother; Coronary artery disease in an other family member; Diabetes in her mother; Gout in her father; Hypertension in her mother; Irritable bowel syndrome in her father; Nephrolithiasis in her brother and mother; Ovarian cancer in her mother; Thyroid cancer in her sister; Ulcers in her father.    ROS:  Please see Tiffany history of present illness. All other systems are reviewed and  Negative to Tiffany above problem except as noted.    PHYSICAL EXAM: VS:  BP 138/92 mmHg  Pulse 72  Ht 5\' 3"  (1.6 m)  Wt 163 lb (73.936 kg)  BMI 28.88 kg/m2  GEN: Well nourished, well developed, in no acute distress HEENT: normal Neck: no JVD, carotid bruits, or masses Cardiac: RRR; no murmurs, rubs, or gallops,no edema  Respiratory:  clear to auscultation bilaterally, normal work of breathing GI: soft, nontender, nondistended, + BS  No hepatomegaly  MS: no deformity Moving all extremities   Skin: warm and dry, no rash Neuro:  Strength and sensation are intact Psych: euthymic mood, full affect   EKG:  EKG is ordered today.  nsr 72 bpm     Lipid Panel    Component Value Date/Time   CHOL * 10/23/2010 0740    226        ATP III CLASSIFICATION:  <200     mg/dL   Desirable  200-239  mg/dL  Borderline High  >=240    mg/dL   High          TRIG 381* 10/23/2010 0740   HDL 44 10/23/2010 0740   CHOLHDL 5.1 10/23/2010 0740   VLDL 76* 10/23/2010 0740    LDLCALC * 10/23/2010 0740    106        Total Cholesterol/HDL:CHD Risk Coronary Heart Disease Risk Table                     Men   Women  1/2 Average Risk   3.4   3.3  Average Risk       5.0   4.4  2 X Average Risk   9.6   7.1  3 X Average Risk  23.4   11.0        Use Tiffany calculated [name] Ratio above and Tiffany CHD Risk Table to determine Tiffany [name]'s CHD Risk.        ATP III CLASSIFICATION (LDL):  <100     mg/dL   Optimal  100-129  mg/dL   Near or Above                    Optimal  130-159  mg/dL   Borderline  160-189  mg/dL   High  >190     mg/dL   Very High      Wt Readings from Last 3 Encounters:  10/07/14 163 lb (73.936 kg)  07/05/14 165 lb 3.2 oz (74.934 kg)  12/29/13 163 lb 6.4 oz (74.118 kg)      ASSESSMENT AND PLAN:  1.  Preop Evaluation  [name] not very active due to knee problems  But i am not convinced ov any active issues that would put her at high risk for surgery  Ovreall I think she is a tlow risk for major cardiac event and is ok to proceed without further testing  2.  HTN  Bp is improved from before she started meds  Still mildly increased  She is due to f/u with R Avva.    Current medicines are reviewed at length with Tiffany [name] today.  Tiffany [name] does not have concerns regarding medicines.  Tiffany following changes have been made: None  Labs/ tests ordered today include:  None No orders of Tiffany defined types were placed in this encounter.     Disposition:   FU with me prn  Signed, Dorris Carnes, MD  10/07/2014 12:38 PM    [address], [place], [place]  [address] Phone: [phone]; Fax: [phone]

## 2014-10-10 MED ORDER — CEFAZOLIN SODIUM-DEXTROSE 2-3 GM-% IV SOLR
2.0000 g | INTRAVENOUS | Status: AC
  Administered 2014-10-11: 2 g via INTRAVENOUS
  Filled 2014-10-10: qty 50

## 2014-10-10 NOTE — Progress Notes (Signed)
Pt informed of time change for surgery to 1230pm

## 2014-10-11 ENCOUNTER — Inpatient Hospital Stay (HOSPITAL_COMMUNITY): Payer: Medicare Other | Admitting: Certified Registered Nurse Anesthetist

## 2014-10-11 ENCOUNTER — Inpatient Hospital Stay (HOSPITAL_COMMUNITY)
Admission: RE | Admit: 2014-10-11 | Discharge: 2014-10-15 | DRG: 470 | Disposition: A | Discharge status: Home Health | Payer: Medicare Other | Source: Ambulatory Visit | Attending: Orthopaedic Surgery | Admitting: Orthopaedic Surgery

## 2014-10-11 ENCOUNTER — Encounter (HOSPITAL_COMMUNITY)
Admission: RE | Disposition: A | Discharge status: Home Health | Payer: Self-pay | Source: Ambulatory Visit | Attending: Orthopaedic Surgery

## 2014-10-11 ENCOUNTER — Encounter (HOSPITAL_COMMUNITY): Payer: Self-pay | Admitting: Certified Registered Nurse Anesthetist

## 2014-10-11 ENCOUNTER — Inpatient Hospital Stay (HOSPITAL_COMMUNITY): Payer: Medicare Other

## 2014-10-11 ENCOUNTER — Inpatient Hospital Stay (HOSPITAL_COMMUNITY): Payer: Medicare Other | Admitting: Emergency Medicine

## 2014-10-11 DIAGNOSIS — F329 Major depressive disorder, single episode, unspecified: Secondary | ICD-10-CM | POA: Diagnosis present

## 2014-10-11 DIAGNOSIS — K219 Gastro-esophageal reflux disease without esophagitis: Secondary | ICD-10-CM | POA: Diagnosis present

## 2014-10-11 DIAGNOSIS — M1711 Unilateral primary osteoarthritis, right knee: Principal | ICD-10-CM | POA: Diagnosis present

## 2014-10-11 DIAGNOSIS — E119 Type 2 diabetes mellitus without complications: Secondary | ICD-10-CM | POA: Diagnosis present

## 2014-10-11 DIAGNOSIS — D62 Acute posthemorrhagic anemia: Secondary | ICD-10-CM | POA: Diagnosis not present

## 2014-10-11 DIAGNOSIS — M25561 Pain in right knee: Secondary | ICD-10-CM | POA: Diagnosis present

## 2014-10-11 DIAGNOSIS — I10 Essential (primary) hypertension: Secondary | ICD-10-CM | POA: Diagnosis present

## 2014-10-11 DIAGNOSIS — E785 Hyperlipidemia, unspecified: Secondary | ICD-10-CM | POA: Diagnosis present

## 2014-10-11 DIAGNOSIS — Z96651 Presence of right artificial knee joint: Secondary | ICD-10-CM

## 2014-10-11 HISTORY — PX: TOTAL KNEE ARTHROPLASTY: SHX125

## 2014-10-11 LAB — GLUCOSE, CAPILLARY
GLUCOSE-CAPILLARY: 108 mg/dL — AB (ref 70–99)
GLUCOSE-CAPILLARY: 114 mg/dL — AB (ref 70–99)
Glucose-Capillary: 159 mg/dL — ABNORMAL HIGH (ref 70–99)

## 2014-10-11 LAB — APTT: aPTT: 29 seconds (ref 24–37)

## 2014-10-11 LAB — PROTIME-INR
INR: 0.98 (ref 0.00–1.49)
Prothrombin Time: 13.1 seconds (ref 11.6–15.2)

## 2014-10-11 SURGERY — ARTHROPLASTY, KNEE, TOTAL
Anesthesia: General | Site: Knee | Laterality: Right

## 2014-10-11 MED ORDER — PHENYLEPHRINE 40 MCG/ML (10ML) SYRINGE FOR IV PUSH (FOR BLOOD PRESSURE SUPPORT)
PREFILLED_SYRINGE | INTRAVENOUS | Status: AC
  Filled 2014-10-11: qty 20

## 2014-10-11 MED ORDER — FENTANYL CITRATE 0.05 MG/ML IJ SOLN
50.0000 ug | Freq: Once | INTRAMUSCULAR | Status: AC
  Administered 2014-10-11: 50 ug via INTRAVENOUS

## 2014-10-11 MED ORDER — DIPHENHYDRAMINE HCL 12.5 MG/5ML PO ELIX: 12.5000 mg | ORAL_SOLUTION | ORAL | Status: DC | PRN

## 2014-10-11 MED ORDER — ACETAMINOPHEN 325 MG PO TABS
650.0000 mg | ORAL_TABLET | Freq: Four times a day (QID) | ORAL | Status: DC | PRN
  Administered 2014-10-12 – 2014-10-15 (×5): 650 mg via ORAL
  Filled 2014-10-11 (×5): qty 2

## 2014-10-11 MED ORDER — BUPIVACAINE LIPOSOME 1.3 % IJ SUSP
20.0000 mL | INTRAMUSCULAR | Status: AC
  Administered 2014-10-11: 20 mL
  Filled 2014-10-11: qty 20

## 2014-10-11 MED ORDER — RIVAROXABAN 10 MG PO TABS
10.0000 mg | ORAL_TABLET | Freq: Every day | ORAL | Status: DC
Start: 2014-10-12 — End: 2014-10-15
  Administered 2014-10-12 – 2014-10-15 (×4): 10 mg via ORAL
  Filled 2014-10-11 (×4): qty 1

## 2014-10-11 MED ORDER — SODIUM CHLORIDE 0.9 % IV SOLN
INTRAVENOUS | Status: DC
  Administered 2014-10-11: 22:00:00 via INTRAVENOUS

## 2014-10-11 MED ORDER — ROCURONIUM BROMIDE 50 MG/5ML IV SOLN
INTRAVENOUS | Status: AC
  Filled 2014-10-11: qty 1

## 2014-10-11 MED ORDER — LACTATED RINGERS IV SOLN
INTRAVENOUS | Status: DC | PRN
  Administered 2014-10-11 (×2): via INTRAVENOUS

## 2014-10-11 MED ORDER — SODIUM CHLORIDE 0.9 % IJ SOLN
INTRAMUSCULAR | Status: DC | PRN
  Administered 2014-10-11: 40 mL

## 2014-10-11 MED ORDER — ACETAMINOPHEN 650 MG RE SUPP: 650.0000 mg | Freq: Four times a day (QID) | RECTAL | Status: DC | PRN

## 2014-10-11 MED ORDER — METOPROLOL SUCCINATE ER 25 MG PO TB24
25.0000 mg | ORAL_TABLET | Freq: Every day | ORAL | Status: DC
  Administered 2014-10-11 – 2014-10-12 (×2): 25 mg via ORAL
  Filled 2014-10-11 (×4): qty 1

## 2014-10-11 MED ORDER — VITAMIN D 1000 UNITS PO TABS
4000.0000 [IU] | ORAL_TABLET | Freq: Every day | ORAL | Status: DC
  Administered 2014-10-12 – 2014-10-15 (×4): 4000 [IU] via ORAL
  Filled 2014-10-11 (×5): qty 4

## 2014-10-11 MED ORDER — DIAZEPAM 5 MG PO TABS
10.0000 mg | ORAL_TABLET | Freq: Every day | ORAL | Status: DC
  Administered 2014-10-11 – 2014-10-14 (×4): 10 mg via ORAL
  Filled 2014-10-11 (×5): qty 2

## 2014-10-11 MED ORDER — PHENOL 1.4 % MT LIQD: 1.0000 | OROMUCOSAL | Status: DC | PRN

## 2014-10-11 MED ORDER — ONDANSETRON HCL 4 MG/2ML IJ SOLN
INTRAMUSCULAR | Status: AC
  Administered 2014-10-11: 4 mg via INTRAVENOUS
  Filled 2014-10-11: qty 2

## 2014-10-11 MED ORDER — ACETAMINOPHEN 10 MG/ML IV SOLN
1000.0000 mg | Freq: Once | INTRAVENOUS | Status: AC
  Administered 2014-10-11: 1000 mg via INTRAVENOUS

## 2014-10-11 MED ORDER — INSULIN ASPART 100 UNIT/ML ~~LOC~~ SOLN
0.0000 [IU] | Freq: Three times a day (TID) | SUBCUTANEOUS | Status: DC
  Administered 2014-10-12 – 2014-10-13 (×2): 2 [IU] via SUBCUTANEOUS
  Administered 2014-10-14: 3 [IU] via SUBCUTANEOUS
  Administered 2014-10-15: 2 [IU] via SUBCUTANEOUS

## 2014-10-11 MED ORDER — LORATADINE 10 MG PO TABS: 10.0000 mg | ORAL_TABLET | Freq: Every day | ORAL | Status: DC | PRN

## 2014-10-11 MED ORDER — 0.9 % SODIUM CHLORIDE (POUR BTL) OPTIME
TOPICAL | Status: DC | PRN
  Administered 2014-10-11: 1000 mL

## 2014-10-11 MED ORDER — TOBRAMYCIN 0.3 % OP SOLN: 1.0000 [drp] | Freq: Four times a day (QID) | OPHTHALMIC | Status: DC

## 2014-10-11 MED ORDER — LACTATED RINGERS IV SOLN
INTRAVENOUS | Status: DC
  Administered 2014-10-11: 11:00:00 via INTRAVENOUS

## 2014-10-11 MED ORDER — FENTANYL CITRATE 0.05 MG/ML IJ SOLN
INTRAMUSCULAR | Status: AC
  Filled 2014-10-11: qty 5

## 2014-10-11 MED ORDER — ONDANSETRON HCL 4 MG/2ML IJ SOLN
INTRAMUSCULAR | Status: DC | PRN
  Administered 2014-10-11: 4 mg via INTRAVENOUS

## 2014-10-11 MED ORDER — OXYCODONE HCL 5 MG PO TABS
5.0000 mg | ORAL_TABLET | ORAL | Status: DC | PRN
  Administered 2014-10-11 – 2014-10-13 (×7): 10 mg via ORAL
  Filled 2014-10-11 (×7): qty 2

## 2014-10-11 MED ORDER — CEFAZOLIN SODIUM 1-5 GM-% IV SOLN
1.0000 g | Freq: Four times a day (QID) | INTRAVENOUS | Status: AC
  Administered 2014-10-11 – 2014-10-12 (×2): 1 g via INTRAVENOUS
  Filled 2014-10-11 (×2): qty 50

## 2014-10-11 MED ORDER — HYDROMORPHONE HCL 1 MG/ML IJ SOLN
INTRAMUSCULAR | Status: AC
  Administered 2014-10-11: 0.5 mg via INTRAVENOUS
  Filled 2014-10-11: qty 1

## 2014-10-11 MED ORDER — ONDANSETRON HCL 4 MG/2ML IJ SOLN
INTRAMUSCULAR | Status: AC
  Filled 2014-10-11: qty 2

## 2014-10-11 MED ORDER — ATORVASTATIN CALCIUM 40 MG PO TABS
40.0000 mg | ORAL_TABLET | Freq: Every day | ORAL | Status: DC
  Administered 2014-10-12 – 2014-10-14 (×3): 40 mg via ORAL
  Filled 2014-10-11 (×3): qty 1

## 2014-10-11 MED ORDER — PROPOFOL 10 MG/ML IV BOLUS
INTRAVENOUS | Status: AC
  Filled 2014-10-11: qty 20

## 2014-10-11 MED ORDER — HYDROMORPHONE HCL 1 MG/ML IJ SOLN
0.2500 mg | INTRAMUSCULAR | Status: DC | PRN
  Administered 2014-10-11: 0.5 mg via INTRAVENOUS

## 2014-10-11 MED ORDER — MIDAZOLAM HCL 2 MG/2ML IJ SOLN: 1.0000 mg | INTRAMUSCULAR | Status: DC

## 2014-10-11 MED ORDER — HYDROMORPHONE HCL 1 MG/ML IJ SOLN
1.0000 mg | INTRAMUSCULAR | Status: DC | PRN
  Administered 2014-10-11 – 2014-10-12 (×2): 1 mg via INTRAVENOUS
  Filled 2014-10-11 (×3): qty 1

## 2014-10-11 MED ORDER — OXYCODONE HCL 5 MG/5ML PO SOLN: 5.0000 mg | Freq: Once | ORAL | Status: DC | PRN

## 2014-10-11 MED ORDER — ACETAMINOPHEN 500 MG PO TABS: 1000.0000 mg | ORAL_TABLET | Freq: Three times a day (TID) | ORAL | Status: DC | PRN

## 2014-10-11 MED ORDER — DOCUSATE SODIUM 100 MG PO CAPS
100.0000 mg | ORAL_CAPSULE | Freq: Two times a day (BID) | ORAL | Status: DC
  Administered 2014-10-11 – 2014-10-15 (×8): 100 mg via ORAL
  Filled 2014-10-11 (×9): qty 1

## 2014-10-11 MED ORDER — MIDAZOLAM HCL 2 MG/2ML IJ SOLN
INTRAMUSCULAR | Status: AC
  Administered 2014-10-11: 1 mg
  Filled 2014-10-11: qty 2

## 2014-10-11 MED ORDER — ONDANSETRON HCL 4 MG/2ML IJ SOLN: 4.0000 mg | Freq: Four times a day (QID) | INTRAMUSCULAR | Status: DC | PRN

## 2014-10-11 MED ORDER — METHOCARBAMOL 1000 MG/10ML IJ SOLN
500.0000 mg | Freq: Four times a day (QID) | INTRAVENOUS | Status: DC | PRN
  Filled 2014-10-11: qty 5

## 2014-10-11 MED ORDER — FENTANYL CITRATE 0.05 MG/ML IJ SOLN
INTRAMUSCULAR | Status: AC
  Filled 2014-10-11: qty 2

## 2014-10-11 MED ORDER — ONDANSETRON HCL 4 MG PO TABS: 4.0000 mg | ORAL_TABLET | Freq: Four times a day (QID) | ORAL | Status: DC | PRN

## 2014-10-11 MED ORDER — METFORMIN HCL 500 MG PO TABS
500.0000 mg | ORAL_TABLET | Freq: Every day | ORAL | Status: DC
  Administered 2014-10-12 – 2014-10-15 (×4): 500 mg via ORAL
  Filled 2014-10-11 (×5): qty 1

## 2014-10-11 MED ORDER — MIDAZOLAM HCL 5 MG/ML IJ SOLN: 1.0000 mg | Freq: Once | INTRAMUSCULAR | Status: DC

## 2014-10-11 MED ORDER — FENTANYL CITRATE 0.05 MG/ML IJ SOLN
INTRAMUSCULAR | Status: DC | PRN
  Administered 2014-10-11: 50 ug via INTRAVENOUS
  Administered 2014-10-11 (×2): 25 ug via INTRAVENOUS
  Administered 2014-10-11: 50 ug via INTRAVENOUS
  Administered 2014-10-11 (×2): 25 ug via INTRAVENOUS

## 2014-10-11 MED ORDER — ONDANSETRON HCL 4 MG/2ML IJ SOLN
4.0000 mg | Freq: Once | INTRAMUSCULAR | Status: AC | PRN
  Administered 2014-10-11: 4 mg via INTRAVENOUS

## 2014-10-11 MED ORDER — POLYVINYL ALCOHOL 1.4 % OP SOLN
1.0000 [drp] | Freq: Every day | OPHTHALMIC | Status: DC
Start: 2014-10-12 — End: 2014-10-15
  Administered 2014-10-12: 1 [drp] via OPHTHALMIC
  Administered 2014-10-14 – 2014-10-15 (×2): 2 [drp] via OPHTHALMIC
  Filled 2014-10-11: qty 15

## 2014-10-11 MED ORDER — LIDOCAINE HCL (CARDIAC) 20 MG/ML IV SOLN
INTRAVENOUS | Status: DC | PRN
  Administered 2014-10-11: 80 mg via INTRAVENOUS

## 2014-10-11 MED ORDER — METHOCARBAMOL 500 MG PO TABS
500.0000 mg | ORAL_TABLET | Freq: Four times a day (QID) | ORAL | Status: DC | PRN
  Administered 2014-10-12 – 2014-10-13 (×3): 500 mg via ORAL
  Filled 2014-10-11 (×3): qty 1

## 2014-10-11 MED ORDER — DIAZEPAM 5 MG PO TABS
ORAL_TABLET | ORAL | Status: AC
  Filled 2014-10-11: qty 1

## 2014-10-11 MED ORDER — STERILE WATER FOR INJECTION IJ SOLN
INTRAMUSCULAR | Status: AC
  Filled 2014-10-11: qty 10

## 2014-10-11 MED ORDER — SODIUM CHLORIDE 0.9 % IR SOLN
Status: DC | PRN
  Administered 2014-10-11: 3000 mL

## 2014-10-11 MED ORDER — DULOXETINE HCL 30 MG PO CPEP
30.0000 mg | ORAL_CAPSULE | Freq: Every day | ORAL | Status: DC
  Administered 2014-10-12 – 2014-10-15 (×4): 30 mg via ORAL
  Filled 2014-10-11 (×5): qty 1

## 2014-10-11 MED ORDER — METOCLOPRAMIDE HCL 10 MG PO TABS: 5.0000 mg | ORAL_TABLET | Freq: Three times a day (TID) | ORAL | Status: DC | PRN

## 2014-10-11 MED ORDER — PANTOPRAZOLE SODIUM 40 MG PO TBEC
40.0000 mg | DELAYED_RELEASE_TABLET | Freq: Every day | ORAL | Status: DC
  Administered 2014-10-12 – 2014-10-15 (×4): 40 mg via ORAL
  Filled 2014-10-11 (×5): qty 1

## 2014-10-11 MED ORDER — MEPERIDINE HCL 25 MG/ML IJ SOLN: 25.0000 mg | INTRAMUSCULAR | Status: DC | PRN

## 2014-10-11 MED ORDER — PHENYLEPHRINE HCL 10 MG/ML IJ SOLN
INTRAMUSCULAR | Status: DC | PRN
  Administered 2014-10-11 (×2): 40 ug via INTRAVENOUS
  Administered 2014-10-11 (×7): 80 ug via INTRAVENOUS

## 2014-10-11 MED ORDER — OXYCODONE HCL 5 MG PO TABS
5.0000 mg | ORAL_TABLET | Freq: Once | ORAL | Status: DC | PRN
Start: 2014-10-11 — End: 2014-10-11

## 2014-10-11 MED ORDER — ALUM & MAG HYDROXIDE-SIMETH 200-200-20 MG/5ML PO SUSP: 30.0000 mL | ORAL | Status: DC | PRN

## 2014-10-11 MED ORDER — METOCLOPRAMIDE HCL 5 MG/ML IJ SOLN
5.0000 mg | Freq: Three times a day (TID) | INTRAMUSCULAR | Status: DC | PRN
  Administered 2014-10-11: 10 mg via INTRAVENOUS
  Filled 2014-10-11: qty 2

## 2014-10-11 MED ORDER — EPHEDRINE SULFATE 50 MG/ML IJ SOLN
INTRAMUSCULAR | Status: DC | PRN
Start: 2014-10-11 — End: 2014-10-11
  Administered 2014-10-11: 5 mg via INTRAVENOUS
  Administered 2014-10-11 (×2): 10 mg via INTRAVENOUS
  Administered 2014-10-11 (×3): 5 mg via INTRAVENOUS
  Administered 2014-10-11: 10 mg via INTRAVENOUS

## 2014-10-11 MED ORDER — PROPOFOL 10 MG/ML IV BOLUS
INTRAVENOUS | Status: DC | PRN
  Administered 2014-10-11: 160 mg via INTRAVENOUS

## 2014-10-11 MED ORDER — LIDOCAINE HCL (CARDIAC) 20 MG/ML IV SOLN
INTRAVENOUS | Status: AC
  Filled 2014-10-11: qty 5

## 2014-10-11 MED ORDER — MENTHOL 3 MG MT LOZG: 1.0000 | LOZENGE | OROMUCOSAL | Status: DC | PRN

## 2014-10-11 MED ORDER — EPHEDRINE SULFATE 50 MG/ML IJ SOLN
INTRAMUSCULAR | Status: AC
  Filled 2014-10-11: qty 1

## 2014-10-11 SURGICAL SUPPLY — 70 items
BANDAGE ELASTIC 6 VELCRO ST LF (GAUZE/BANDAGES/DRESSINGS) ×2 IMPLANT
BANDAGE ESMARK 6X9 LF (GAUZE/BANDAGES/DRESSINGS) ×1 IMPLANT
BENZOIN TINCTURE PRP APPL 2/3 (GAUZE/BANDAGES/DRESSINGS) ×2 IMPLANT
BLADE SAG 18X100X1.27 (BLADE) ×2 IMPLANT
BNDG ESMARK 6X9 LF (GAUZE/BANDAGES/DRESSINGS) ×2
BOWL SMART MIX CTS (DISPOSABLE) ×2 IMPLANT
CAPT KNEE TOTAL 3 ×2 IMPLANT
CEMENT BONE SIMPLEX SPEEDSET (Cement) ×4 IMPLANT
CHLORAPREP W/TINT 26ML (MISCELLANEOUS) ×2 IMPLANT
COVER SURGICAL LIGHT HANDLE (MISCELLANEOUS) ×2 IMPLANT
CUFF TOURNIQUET SINGLE 34IN LL (TOURNIQUET CUFF) ×2 IMPLANT
CUFF TOURNIQUET SINGLE 44IN (TOURNIQUET CUFF) IMPLANT
DRAPE IMP U-DRAPE 54X76 (DRAPES) ×2 IMPLANT
DRAPE INCISE IOBAN 66X45 STRL (DRAPES) ×2 IMPLANT
DRAPE ORTHO SPLIT 77X108 STRL (DRAPES)
DRAPE PROXIMA HALF (DRAPES) IMPLANT
DRAPE SURG ORHT 6 SPLT 77X108 (DRAPES) IMPLANT
DRAPE U-SHAPE 47X51 STRL (DRAPES) IMPLANT
DRSG PAD ABDOMINAL 8X10 ST (GAUZE/BANDAGES/DRESSINGS) ×4 IMPLANT
DURAPREP 26ML APPLICATOR (WOUND CARE) IMPLANT
ELECT REM PT RETURN 9FT ADLT (ELECTROSURGICAL) ×2
ELECTRODE REM PT RTRN 9FT ADLT (ELECTROSURGICAL) ×1 IMPLANT
EVACUATOR 1/8 PVC DRAIN (DRAIN) IMPLANT
FACESHIELD WRAPAROUND (MASK) ×6 IMPLANT
GAUZE SPONGE 4X4 12PLY STRL (GAUZE/BANDAGES/DRESSINGS) ×2 IMPLANT
GAUZE XEROFORM 1X8 LF (GAUZE/BANDAGES/DRESSINGS) IMPLANT
GLOVE BIOGEL PI IND STRL 7.0 (GLOVE) ×2 IMPLANT
GLOVE BIOGEL PI IND STRL 8 (GLOVE) ×2 IMPLANT
GLOVE BIOGEL PI INDICATOR 7.0 (GLOVE) ×2
GLOVE BIOGEL PI INDICATOR 8 (GLOVE) ×2
GLOVE SURG SS PI 6.5 STRL IVOR (GLOVE) ×2 IMPLANT
GLOVE SURG SS PI 7.0 STRL IVOR (GLOVE) ×4 IMPLANT
GLOVE SURG SS PI 7.5 STRL IVOR (GLOVE) ×2 IMPLANT
GLOVE SURG SS PI 8.0 STRL IVOR (GLOVE) ×2 IMPLANT
GOWN STRL REUS W/ TWL LRG LVL3 (GOWN DISPOSABLE) ×3 IMPLANT
GOWN STRL REUS W/ TWL XL LVL3 (GOWN DISPOSABLE) ×1 IMPLANT
GOWN STRL REUS W/TWL LRG LVL3 (GOWN DISPOSABLE) ×3
GOWN STRL REUS W/TWL XL LVL3 (GOWN DISPOSABLE) ×1
HANDPIECE INTERPULSE COAX TIP (DISPOSABLE) ×1
IMMOBILIZER KNEE 20 (SOFTGOODS) ×2
IMMOBILIZER KNEE 20 THIGH 36 (SOFTGOODS) ×1 IMPLANT
IMMOBILIZER KNEE 22 UNIV (SOFTGOODS) IMPLANT
KIT BASIN OR (CUSTOM PROCEDURE TRAY) ×2 IMPLANT
KIT ROOM TURNOVER OR (KITS) ×2 IMPLANT
MANIFOLD NEPTUNE II (INSTRUMENTS) ×2 IMPLANT
NEEDLE 22X1 1/2 (OR ONLY) (NEEDLE) ×2 IMPLANT
NS IRRIG 1000ML POUR BTL (IV SOLUTION) ×2 IMPLANT
PACK TOTAL JOINT (CUSTOM PROCEDURE TRAY) ×2 IMPLANT
PACK UNIVERSAL I (CUSTOM PROCEDURE TRAY) ×2 IMPLANT
PAD ARMBOARD 7.5X6 YLW CONV (MISCELLANEOUS) ×4 IMPLANT
PAD CAST 4YDX4 CTTN HI CHSV (CAST SUPPLIES) ×1 IMPLANT
PADDING CAST COTTON 4X4 STRL (CAST SUPPLIES) ×1
PADDING CAST COTTON 6X4 STRL (CAST SUPPLIES) ×2 IMPLANT
SET HNDPC FAN SPRY TIP SCT (DISPOSABLE) ×1 IMPLANT
SET PAD KNEE POSITIONER (MISCELLANEOUS) ×2 IMPLANT
STAPLER VISISTAT 35W (STAPLE) IMPLANT
STRIP CLOSURE SKIN 1/2X4 (GAUZE/BANDAGES/DRESSINGS) ×2 IMPLANT
SUCTION FRAZIER TIP 10 FR DISP (SUCTIONS) ×2 IMPLANT
SUT VIC AB 0 CT1 27 (SUTURE) ×1
SUT VIC AB 0 CT1 27XBRD ANBCTR (SUTURE) ×1 IMPLANT
SUT VIC AB 1 CT1 27 (SUTURE) ×2
SUT VIC AB 1 CT1 27XBRD ANBCTR (SUTURE) ×2 IMPLANT
SUT VIC AB 2-0 CT1 27 (SUTURE) ×2
SUT VIC AB 2-0 CT1 TAPERPNT 27 (SUTURE) ×2 IMPLANT
SUT VIC AB 4-0 PS2 27 (SUTURE) ×2 IMPLANT
TOWEL OR 17X24 6PK STRL BLUE (TOWEL DISPOSABLE) ×2 IMPLANT
TOWEL OR 17X26 10 PK STRL BLUE (TOWEL DISPOSABLE) ×2 IMPLANT
TRAY FOLEY CATH 16FRSI W/METER (SET/KITS/TRAYS/PACK) IMPLANT
WATER STERILE IRR 1000ML POUR (IV SOLUTION) IMPLANT
WRAP KNEE MAXI GEL POST OP (GAUZE/BANDAGES/DRESSINGS) ×2 IMPLANT

## 2014-10-11 NOTE — Progress Notes (Signed)
Orthopedic Tech Progress Note Patient Details:  Tiffany [name] [birth year] [id number]  CPM Right Knee CPM Right Knee: On Right Knee Flexion (Degrees): 90 Right Knee Extension (Degrees): 0 Additional Comments: trapeze bar patient helper Viewed order from doctor's order list  Hildred Priest 10/11/2014, 3:55 PM

## 2014-10-11 NOTE — Anesthesia Procedure Notes (Addendum)
Anesthesia Regional Block:  Adductor canal block  Pre-Anesthetic Checklist: ,, timeout performed, Correct Patient, Correct Site, Correct Laterality, Correct Procedure, Correct Position, site marked, Risks and benefits discussed,  Surgical consent,  Pre-op evaluation,  At surgeon's request and post-op pain management  Laterality: Right and Upper  Prep: chloraprep       Needles:   Needle Type: Echogenic Needle     Needle Length: 9cm 9 cm Needle Gauge: 21 and 21 G  Needle insertion depth: 5 cm   Additional Needles:  Procedures: ultrasound guided (picture in chart) Adductor canal block Narrative:  Start time: 10/11/2014 11:55 AM End time: 10/11/2014 12:10 PM Injection made incrementally with aspirations every 5 mL.  Performed by: Personally  Anesthesiologist: MASSAGEE, TERRY  Additional Notes: Tolerated well   Procedure Name: LMA Insertion Date/Time: 10/11/2014 12:54 PM Performed by: Carney Living Pre-anesthesia Checklist: Patient identified, Emergency Drugs available, Suction available, Patient being monitored and Timeout performed Patient Re-evaluated:Patient Re-evaluated prior to inductionOxygen Delivery Method: Circle system utilized Preoxygenation: Pre-oxygenation with 100% oxygen Intubation Type: IV induction LMA: LMA inserted LMA Size: 4.0 Number of attempts: 1 Placement Confirmation: positive ETCO2 and breath sounds checked- equal and bilateral Tube secured with: Tape Dental Injury: Teeth and Oropharynx as per pre-operative assessment

## 2014-10-11 NOTE — H&P (Signed)
TOTAL KNEE ADMISSION H&P  Patient is being admitted for right total knee arthroplasty.  Subjective:  Chief Complaint:right knee pain.  HPI: Tiffany [name], 64 y.o. female, has a history of pain and functional disability in the right knee due to arthritis and has failed non-surgical conservative treatments for greater than 12 weeks to includeNSAID's and/or analgesics, corticosteriod injections, flexibility and strengthening excercises, use of assistive devices and activity modification.  Onset of symptoms was gradual, starting 5 years ago with gradually worsening course since that time. The patient noted prior procedures on the knee to include  arthroscopy on the right knee(s).  Patient currently rates pain in the right knee(s) at 9 out of 10 with activity. Patient has night pain, worsening of pain with activity and weight bearing, pain that interferes with activities of daily living, pain with passive range of motion, crepitus and joint swelling.  Patient has evidence of subchondral sclerosis, periarticular osteophytes and joint space narrowing by imaging studies. There is no active infection.  Patient Active Problem List   Diagnosis Date Noted  . Osteoarthritis of right knee 10/11/2014  . Primary snoring 07/05/2014  . Bronchiectasis with acute exacerbation 12/29/2013  . Bronchiectasis 11/10/2013  . DM type 2 (diabetes mellitus, type 2) 11/10/2013  . PERSONAL HX COLONIC POLYPS 03/07/2008  . ADENOMATOUS COLONIC POLYP 03/04/2008  . ESOPHAGEAL STRICTURE 03/04/2008  . GERD 03/04/2008  . IBS 03/04/2008  . Dysphagia, pharyngoesophageal phase 02/05/2008   Past Medical History  Diagnosis Date  . Diabetes mellitus   . Meningitis   . Hypertension   . GERD (gastroesophageal reflux disease)   . Hiatal hernia   . Gastritis   . IBS (irritable bowel syndrome)   . Depression   . Colon polyps   . Spina bifida occulta   . Insomnia   . Allergy   . Cataract     REMOVED  . Hyperlipidemia   .  Osteoporosis   . Ulcer   . Dysrhythmia     RAPID HEART BEAT , CHEST TIGHTNESS  09/29/14     . Pneumonia     BRONCHIECTASIS  . Chronic cough   . Dysphagia   . Shortness of breath dyspnea     WITH EXERTION  . Bronchiectasis   . Macular disorder     MACULAR DISEASE  RT EYE   . Anxiety   . Cancer     SKIN    . Kidney stones     KIDNEY DISEASE   STAGE lll    . Kidney stones   . Arthritis     foot drop  lt foot  / toes   wears brace     . Anginal pain     Past Surgical History  Procedure Laterality Date  . Back surgery    . Abdominal hysterectomy    . Tonsillectomy    . Polypectomy    . Meniscus repair    . Fundoplication    . Lithotripsy    . Elbow surgery      left  . Cataract extraction  03/2012    bilateral  . Colonoscopy    . Knee arthroscopy      rt knee   . Eardrum      left   ruptured and repaired    Prescriptions prior to admission  Medication Sig Dispense Refill Last Dose  . acetaminophen (TYLENOL) 500 MG tablet Take 1,000 mg by mouth every 8 (eight) hours as needed for mild pain or moderate pain.  10/10/2014 at Unknown time  . Aflibercept (EYLEA IO) Inject into the eye every 3 (three) months.   Past Week at Unknown time  . Calcium Carbonate-Vit D-Min (CALCIUM 1200 PO) Take 1 tablet by mouth daily.   Past Month at Unknown time  . Cholecalciferol (VITAMIN D) 2000 UNITS CAPS Take 2 capsules by mouth daily.   Past Month at Unknown time  . diazepam (VALIUM) 10 MG tablet Take 10 mg by mouth at bedtime.   10/10/2014 at Unknown time  . DULoxetine (CYMBALTA) 30 MG capsule Take 30 mg by mouth daily.   10/11/2014 at 0900  . EPINEPHrine (EPIPEN JR) 0.15 MG/0.3ML injection Inject 0.15 mg into the muscle as needed.   8 yrs. ago  . loratadine (CLARITIN) 10 MG tablet Take 10 mg by mouth daily as needed for allergies.    10/11/2014 at 0900  . metFORMIN (GLUCOPHAGE) 500 MG tablet Take 500 mg by mouth daily with breakfast.    Taking  . metoprolol succinate (TOPROL-XL) 25 MG 24 hr  tablet Take 25 mg by mouth daily.   10/10/2014 at 1830  . omeprazole (PRILOSEC) 20 MG capsule Take 20 mg by mouth daily.   10/11/2014 at 0900  . Polyethyl Glycol-Propyl Glycol (SYSTANE ULTRA) 0.4-0.3 % SOLN Apply 1-2 drops to eye daily.   10/11/2014 at 0900  . Probiotic Product (ALIGN) 4 MG CAPS Take 1 capsule by mouth daily as needed (IBS).    10/11/2014 at 0900  . simvastatin (ZOCOR) 80 MG tablet Take 80 mg by mouth every morning.    10/10/2014 at Unknown time  . tobramycin (TOBREX) 0.3 % ophthalmic solution Place 1 drop into the right eye every 6 (six) hours. The day before, the day of, and the after procedure.   Past Week at Unknown time  . diphenhydrAMINE (BENADRYL) 25 mg capsule Take 25 mg by mouth daily as needed for allergies.    More than a month at Unknown time  . EPIPEN 2-PAK 0.3 MG/0.3ML SOAJ injection as needed.  0 Taking  . furosemide (LASIX) 40 MG tablet Take 40 mg by mouth daily as needed for fluid or edema.    More than a month at Unknown time  . ONE TOUCH ULTRA TEST test strip as needed.  1 Taking  . ONETOUCH DELICA LANCETS 70J MISC as directed.  1 Taking  . promethazine (PHENERGAN) 25 MG tablet Take 25 mg by mouth every 6 (six) hours as needed for nausea or vomiting.    More than a month at Unknown time   Allergies  Allergen Reactions  . Ambien [Zolpidem Tartrate] Other (See Comments)    Nightmares and insomnia  . Aspirin Other (See Comments)    GI bleed  . Benicar [Olmesartan Medoxomil] Shortness Of Breath, Swelling and Other (See Comments)    Swelling of throat  . Ketorolac Tromethamine Anaphylaxis  . Latex Shortness Of Breath and Itching  . Lisinopril Shortness Of Breath, Swelling and Other (See Comments)     Swelling of throat  . Other Anaphylaxis    Peaches and mangoes, tree nuts, tomatoes, shellfish, Cat dandor, green peas  . Reclast [Zoledronic Acid] Other (See Comments)    Ocular reaction, toxic  . Toradol [Ketorolac Tromethamine] Anaphylaxis  . Codeine Nausea And  Vomiting  . Erythromycin Nausea And Vomiting  . Fenofibrate Nausea And Vomiting  . Glucosamine Forte [Nutritional Supplements] Diarrhea and Nausea Only  . Levaquin [Levofloxacin In D5w] Diarrhea and Swelling  . Percocet [Oxycodone-Acetaminophen] Nausea And Vomiting  .  Tramadol Nausea And Vomiting  . Tizanidine Nausea And Vomiting and Other (See Comments)    unknown    History  Substance Use Topics  . Smoking status: Never Smoker   . Smokeless tobacco: Never Used  . Alcohol Use: No     Comment: OCC.    Family History  Problem Relation Age of Onset  . Alzheimer's disease Father   . Irritable bowel syndrome Father   . Ulcers Father   . Gout Father   . Cancer Mother     ?  . Diabetes Mother   . Hypertension Mother   . Nephrolithiasis Mother   . Colon cancer Mother   . Ovarian cancer Mother   . Coronary artery disease      grandparent  . Thyroid cancer Sister   . Nephrolithiasis Brother   . Colon cancer Maternal Grandmother      Review of Systems  Musculoskeletal: Positive for joint pain.  All other systems reviewed and are negative.   Objective:  Physical Exam  Constitutional: She is oriented to person, place, and time. She appears well-developed and well-nourished.  HENT:  Head: Normocephalic and atraumatic.  Eyes: EOM are normal. Pupils are equal, round, and reactive to light.  Neck: Normal range of motion. Neck supple.  Cardiovascular: Normal rate and regular rhythm.   Respiratory: Effort normal and breath sounds normal.  GI: Soft. Bowel sounds are normal.  Musculoskeletal:       Right knee: She exhibits effusion and bony tenderness. Tenderness found. Medial joint line and lateral joint line tenderness noted.  Neurological: She is alert and oriented to person, place, and time.  Skin: Skin is warm and dry.  Psychiatric: She has a normal mood and affect.    Vital signs in last 24 hours: Temp:  [98.2 F (36.8 C)] 98.2 F (36.8 C) (02/16 1050) Pulse Rate:   [64-73] 64 (02/16 1135) Resp:  [14-20] 14 (02/16 1135) BP: (168-189)/(72-88) 189/72 mmHg (02/16 1135) SpO2:  [100 %] 100 % (02/16 1135) Weight:  [73.936 kg (163 lb)] 73.936 kg (163 lb) (02/16 1050)  Labs:   Estimated body mass index is 28.88 kg/(m^2) as calculated from the following:   Height as of this encounter: 5\' 3"  (1.6 m).   Weight as of this encounter: 73.936 kg (163 lb).   Imaging Review Plain radiographs demonstrate severe degenerative joint disease of the right knee(s). The overall alignment ismild varus. The bone quality appears to be good for age and reported activity level.  Assessment/Plan:  End stage arthritis, right knee   The patient history, physical examination, clinical judgment of the provider and imaging studies are consistent with end stage degenerative joint disease of the right knee(s) and total knee arthroplasty is deemed medically necessary. The treatment options including medical management, injection therapy arthroscopy and arthroplasty were discussed at length. The risks and benefits of total knee arthroplasty were presented and reviewed. The risks due to aseptic loosening, infection, stiffness, patella tracking problems, thromboembolic complications and other imponderables were discussed. The patient acknowledged the explanation, agreed to proceed with the plan and consent was signed. Patient is being admitted for inpatient treatment for surgery, pain control, PT, OT, prophylactic antibiotics, VTE prophylaxis, progressive ambulation and ADL's and discharge planning. The patient is planning to be discharged home with home health services

## 2014-10-11 NOTE — Transfer of Care (Signed)
Immediate Anesthesia Transfer of Care Note  Patient: Tiffany [name]  Procedure(s) Performed: Procedure(s): RIGHT TOTAL KNEE ARTHROPLASTY (Right)  Patient Location: PACU  Anesthesia Type:General and Regional  Level of Consciousness: awake, alert  and oriented  Airway & Oxygen Therapy: Patient Spontanous Breathing and Patient connected to nasal cannula oxygen  Post-op Assessment: Report given to RN, Post -op Vital signs reviewed and stable and Patient moving all extremities X 4  Post vital signs: Reviewed and stable  Last Vitals:  Filed Vitals:   10/11/14 1220  BP: 170/73  Pulse: 82  Temp:   Resp: 14    Complications: No apparent anesthesia complications

## 2014-10-11 NOTE — Addendum Note (Signed)
Addendum  created 10/11/14 1735 by [name] Form, MD   Modules edited: Orders, PRL Based Order Sets

## 2014-10-11 NOTE — Anesthesia Postprocedure Evaluation (Signed)
  Anesthesia Post-op Note  Patient: Tiffany [name]  Procedure(s) Performed: Procedure(s): RIGHT TOTAL KNEE ARTHROPLASTY (Right)  Patient Location: PACU  Anesthesia Type: General   Level of Consciousness: awake, alert  and oriented  Airway and Oxygen Therapy: Patient Spontanous Breathing  Post-op Pain: none  Post-op Assessment: Post-op Vital signs reviewed  Post-op Vital Signs: Reviewed  Last Vitals:  Filed Vitals:   10/11/14 1545  BP: 132/61  Pulse: 94  Temp:   Resp: 16    Complications: No apparent anesthesia complications

## 2014-10-11 NOTE — Brief Op Note (Signed)
10/11/2014  2:22 PM  PATIENT:  Tiffany [name]  64 y.o. female  PRE-OPERATIVE DIAGNOSIS:  Osteoarthritis right knee  POST-OPERATIVE DIAGNOSIS:  Osteoarthritis right knee  PROCEDURE:  Procedure(s): RIGHT TOTAL KNEE ARTHROPLASTY (Right)  SURGEON:  Surgeon(s) and Role:    * Mcarthur Rossetti, MD - Primary  PHYSICIAN ASSISTANT: Benita Stabile, PA-C  ANESTHESIA:   local, regional and general  EBL:  Total I/O In: 1000 [I.V.:1000] Out: 200 [Blood:200]  BLOOD ADMINISTERED:none  DRAINS: none   LOCAL MEDICATIONS USED:  OTHER Experil  SPECIMEN:  No Specimen  DISPOSITION OF SPECIMEN:  N/A  COUNTS:  YES  TOURNIQUET:   Total Tourniquet Time Documented: Thigh (Right) - 54 minutes Total: Thigh (Right) - 54 minutes   DICTATION: .Other Dictation: Dictation Number [id number]  PLAN OF CARE: Admit to inpatient   PATIENT DISPOSITION:  PACU - hemodynamically stable.   Delay start of Pharmacological VTE agent (>24hrs) due to surgical blood loss or risk of bleeding: no

## 2014-10-11 NOTE — Progress Notes (Signed)
Orthopedic Tech Progress Note Patient Details:  Tiffany [name] [birth year] [id number] Off cpm at 7:18 pm Patient ID: [name], female   DOB: [birth year], 64 y.o.   MRN: [id number]   Braulio Bosch 10/11/2014, 7:18 PM

## 2014-10-11 NOTE — Anesthesia Preprocedure Evaluation (Addendum)
Anesthesia Evaluation  Patient identified by MRN, date of birth, ID band Patient awake    Reviewed: Allergy & Precautions, NPO status , Patient's Chart, lab work & pertinent test results  History of Anesthesia Complications Negative for: history of anesthetic complications  Airway Mallampati: I       Dental  (+) Dental Advisory Given   Pulmonary shortness of breath and with exertion,  breath sounds clear to auscultation        Cardiovascular hypertension, Pt. on medications and Pt. on home beta blockers + dysrhythmias Rhythm:Regular Rate:Normal     Neuro/Psych PSYCHIATRIC DISORDERS Anxiety Depression    GI/Hepatic hiatal hernia, GERD-  Medicated,  Endo/Other  diabetes, Well Controlled, Type 2, Oral Hypoglycemic Agents  Renal/GU      Musculoskeletal  (+) Arthritis -,   Abdominal   Peds  Hematology   Anesthesia Other Findings   Reproductive/Obstetrics                            Anesthesia Physical Anesthesia Plan  ASA: III  Anesthesia Plan: General   Post-op Pain Management:    Induction:   Airway Management Planned: LMA  Additional Equipment:   Intra-op Plan:   Post-operative Plan: Extubation in OR  Informed Consent: I have reviewed the patients History and Physical, chart, labs and discussed the procedure including the risks, benefits and alternatives for the proposed anesthesia with the patient or authorized representative who has indicated his/her understanding and acceptance.   Dental advisory given  Plan Discussed with: CRNA and Surgeon  Anesthesia Plan Comments:         Anesthesia Quick Evaluation

## 2014-10-11 NOTE — Progress Notes (Signed)
Utilization review completed.  

## 2014-10-12 LAB — BASIC METABOLIC PANEL
Anion gap: 4 — ABNORMAL LOW (ref 5–15)
BUN: 11 mg/dL (ref 6–23)
CO2: 32 mmol/L (ref 19–32)
Calcium: 8.2 mg/dL — ABNORMAL LOW (ref 8.4–10.5)
Chloride: 104 mmol/L (ref 96–112)
Creatinine, Ser: 1.18 mg/dL — ABNORMAL HIGH (ref 0.50–1.10)
GFR calc non Af Amer: 48 mL/min — ABNORMAL LOW (ref >90)
GFR, EST AFRICAN AMERICAN: 56 mL/min — AB (ref >90)
Glucose, Bld: 128 mg/dL — ABNORMAL HIGH (ref 70–99)
Potassium: 4.7 mmol/L (ref 3.5–5.1)
Sodium: 140 mmol/L (ref 135–145)

## 2014-10-12 LAB — CBC
HEMATOCRIT: 30.5 % — AB (ref 36.0–46.0)
Hemoglobin: 9.6 g/dL — ABNORMAL LOW (ref 12.0–15.0)
MCH: 29.8 pg (ref 26.0–34.0)
MCHC: 31.5 g/dL (ref 30.0–36.0)
MCV: 94.7 fL (ref 78.0–100.0)
Platelets: 132 10*3/uL — ABNORMAL LOW (ref 150–400)
RBC: 3.22 MIL/uL — ABNORMAL LOW (ref 3.87–5.11)
RDW: 13.1 % (ref 11.5–15.5)
WBC: 5.7 10*3/uL (ref 4.0–10.5)

## 2014-10-12 LAB — GLUCOSE, CAPILLARY
GLUCOSE-CAPILLARY: 119 mg/dL — AB (ref 70–99)
Glucose-Capillary: 147 mg/dL — ABNORMAL HIGH (ref 70–99)
Glucose-Capillary: 96 mg/dL (ref 70–99)
Glucose-Capillary: 108 mg/dL — ABNORMAL HIGH (ref 70–99)

## 2014-10-12 NOTE — Progress Notes (Signed)
Subjective: 1 Day Post-Op Procedure(s) (LRB): RIGHT TOTAL KNEE ARTHROPLASTY (Right) Patient reports pain as moderate.    Objective: Vital signs in last 24 hours: Temp:  [97.7 F (36.5 C)-99 F (37.2 C)] 98.9 F (37.2 C) (02/17 0457) Pulse Rate:  [64-100] 77 (02/17 0457) Resp:  [8-26] 17 (02/17 0457) BP: (95-189)/(46-88) 100/46 mmHg (02/17 0457) SpO2:  [96 %-100 %] 100 % (02/17 0457) Weight:  [73.936 kg (163 lb)] 73.936 kg (163 lb) (02/16 1050)  Intake/Output from previous day: 02/16 0701 - 02/17 0700 In: 1300 [I.V.:1300] Out: 200 [Blood:200] Intake/Output this shift:     Recent Labs  10/12/14 0552  HGB 9.6*    Recent Labs  10/12/14 0552  WBC 5.7  RBC 3.22*  HCT 30.5*  PLT 132*   No results for input(s): NA, K, CL, CO2, BUN, CREATININE, GLUCOSE, CALCIUM in the last 72 hours.  Recent Labs  10/11/14 1039  INR 0.98    Sensation intact distally Intact pulses distally Dorsiflexion/Plantar flexion intact Incision: dressing C/D/I Compartment soft  Assessment/Plan: 1 Day Post-Op Procedure(s) (LRB): RIGHT TOTAL KNEE ARTHROPLASTY (Right) Up with therapy  BLACKMAN,CHRISTOPHER Y 10/12/2014, 7:38 AM

## 2014-10-12 NOTE — Plan of Care (Signed)
Problem: Consults Goal: Diagnosis- Total Joint Replacement Primary Total Knee     

## 2014-10-12 NOTE — Op Note (Signed)
Tiffany [name], Tiffany [name] NO.:  [id number]  MEDICAL RECORD NO.:  [id number]  LOCATION:  [id number]                        FACILITY:  [facility]  PHYSICIAN:  Lind Guest. Ninfa Linden, M.D.DATE OF BIRTH:  April 06, 1951  DATE OF PROCEDURE:  10/11/2014 DATE OF DISCHARGE:                              OPERATIVE REPORT   PREOPERATIVE DIAGNOSES:  Primary osteoarthritis and degenerative joint disease, right knee.  POSTOPERATIVE DIAGNOSES:  Primary osteoarthritis and degenerative joint disease, right knee.  PROCEDURE:  Right total knee arthroplasty.  IMPLANTS:  Stryker Triathlon knee with size 3 femur, size 3 tibial tray, 9 mm polyethylene insert, size 29 patellar button.  SURGEON:  Lind Guest. Ninfa Linden, M.D.  ASSISTANT:  Erskine Emery, PA-C  ANESTHESIA: 1. Right leg femoral nerve block. 2. General. 3. Exparel, intra-articular injection.  TOURNIQUET TIME:  Less than 1 hour.  BLOOD LOSS:  Less than 100 mL.  COMPLICATIONS:  None.  INDICATIONS:  Tiffany [name] is a 64 year old female who is known for many years now.  She has had at least 2 arthroscopic interventions on her right knee and these have not helped in terms of her pain, locking, and catching and symptoms of swelling on her knee.  She is someone who wears a leg brace on her other leg and puts more pressure on her right knee. She has tried intra-articular injections, quad strengthening exercises, but cannot take anti-inflammatories due to anaphylaxis.  She has also tried a hyaluronic acid injections.  With failure of all these as well as her daily pain or decreased mobility, decreased quality of life, she wished to proceed with the total knee arthroplasty.  She understands the risks of acute blood loss anemia, nerve and vessel injury, fracture, infection, and DVT.  She understands the goals of decreased pain, improved mobility, and overall improved quality of life.  PROCEDURE DESCRIPTION:  After informed consent was  obtained, appropriate right leg was marked.  Anesthesia was obtained her right femoral nerve block.  She was then brought to the operating room, placed supine on the operative table.  General anesthesia was then obtained.  A nonsterile tourniquet was placed around her upper right leg and her right leg was prepped and draped with ChloraPrep and sterile drapes.  Time-out was called to identify correct patient, correct right knee.  We then used an Esmarch to wrap out the leg and tourniquet was inflated to 300 mm of pressure.  We then made a direct midline incision over the patella, carried this proximally and distally and dissected of the knee joint.  I carried out a medial parapatellar arthrotomy and found a minimal effusion and did find the arthritic changes throughout her knee.  We then removed remnants of ACL, PCL, medial, lateral meniscus as well as osteophytes from the knee.  With the knee in a flexed position using an extramedullary cutting guide trying to correct for varus and valgus and neutral slope, we set for 9 mm off the high side, made a tibial cut.  We then used the intramedullary guide to the notch for the distal femoral cut set for the resection of 8 mm.  We set it for 5 degrees, externally rotated for right knee and made  this cut.  With the knee in extension, we placed a 9-mm extension block and had her at full extension.  We then went back to the femur and put our femoral sizing guide based off the epicondylar axis.  We chose a size 3 femur.  We then put our 4:1 cutting block for a size 3 femur, made our anterior and posterior cuts followed by our chamfer cuts.  We then went back to the tibia and set our rotation based on the tibia tubercle in her femoral tray and chose a size 3 tibial tray.  We made our keel punch off this.  With the size 3 tibia and a size 3 femur, trial components in place, we trialed a 9 mm polyethylene insert, and I was pleased with her stability and  range of motion.  We then made our patellar cut and drilled 3 holes for a size 29 patellar button.  We then removed all trial components, and we irrigated the knee with normal saline solution.  I then infiltrated the joint capsule with a mixture of 20 mL of Exparel dilated with 40 mL of saline. We then mixed our cement and cemented the real Stryker Triathlon tibial tray, size 3, the real size 3 femur.  We placed the real 9 mm fix bearing polyethylene insert.  We then cemented the 29 mm of the size 29 patellar button.  Once the cement had hardened, we let the tourniquet down.  Hemostasis was obtained with electrocautery.  We removed cement debris from the knee and then closed the arthrotomy with interrupted #1 Vicryl suture followed by 0 Vicryl of the deep tissue, 2-0 Vicryl in subcutaneous tissue, and 4-0 Monocryl subcuticular stitch and Steri- Strips on the skin.  A well-padded dressing was applied.  She was awakened, extubated, and taken to recovery room in stable condition. All final counts correct with no complications noted.  Of note, Benita Stabile, PA-C assisted me throughout the entire case.  His assistance was crucial for every facet of this case.     Lind Guest. Ninfa Linden, M.D.     CYB/MEDQ  D:  10/11/2014  T:  10/12/2014  Job:  [id number]

## 2014-10-12 NOTE — Progress Notes (Signed)
Orthopedic Tech Progress Note Patient Details:  Tiffany [name] 04-Feb-1951 [id number] On cpm at 8:40 pm Patient ID: Tiffany [name], female   DOB: [birth year], 64 y.o.   MRN: [id number]   Braulio Bosch 10/12/2014, 8:42 PM

## 2014-10-12 NOTE — Evaluation (Signed)
Physical Therapy Evaluation Patient Details Name: Tiffany [name] MRN: [id number] DOB: 1951/04/09 Today's Date: 10/12/2014   History of Present Illness  Pt is a 64 y.o. Female s/p R TKA on 10/11/14. PMH DM, IBS, meningitis, colon polyps, spina bifida occulta, cataract s/p removal, dysrhythmia, skin cancer, osteoporosis, Left foot drop.   Clinical Impression  Pt is s/p Rt TKA POD#1 resulting in the deficits listed below (see PT Problem List). Pt will benefit from skilled PT to increase their independence and safety with mobility to allow discharge to the venue listed below. Pt limited due to pain and dizziness; BP in chair 86/41; nurse present. Multiple bouts of LOB with ambulation; required min (A) to recover; discussed rehab goals with pt in order to D/C home safely.      Follow Up Recommendations Home health PT;Supervision/Assistance - 24 hour    Equipment Recommendations  Rolling walker with 5" wheels    Recommendations for Other Services       Precautions / Restrictions Precautions Precautions: Fall;Knee Precaution Comments: Reviewed knee precautions Required Braces or Orthoses: Other Brace/Splint;Knee Immobilizer - Right Knee Immobilizer - Right: On at all times Other Brace/Splint: pt has AFO brace for LLE that she wears at baseline.  Restrictions Weight Bearing Restrictions: Yes RLE Weight Bearing: Weight bearing as tolerated      Mobility  Bed Mobility Overal bed mobility: Needs Assistance Bed Mobility: Supine to Sit     Supine to sit: Min assist;HOB elevated     General bed mobility comments: up with OT upon arrival  Transfers Overall transfer level: Needs assistance Equipment used: Rolling walker (2 wheeled) Transfers: Sit to/from Stand Sit to Stand: Mod assist         General transfer comment: pt requires max cues for safety and hand placement; mod (A) to power up and maintain balance; pt with wideneded stance   Ambulation/Gait Ambulation/Gait  assistance: Min assist Ambulation Distance (Feet): 18 Feet Assistive device: Rolling walker (2 wheeled) Gait Pattern/deviations: Step-to pattern;Decreased step length - left;Decreased stance time - right;Decreased dorsiflexion - left;Decreased dorsiflexion - right;Shuffle;Antalgic;Trunk flexed;Wide base of support Gait velocity: very decr Gait velocity interpretation: Below normal speed for age/gender General Gait Details: pt with shuffled gt; does not have shoes in room to donn AFO on Lt; max cues for safety; min (A) to balance and manage RW; limtied due to pain and dizziness  Stairs            Wheelchair Mobility    Modified Rankin (Stroke Patients Only)       Balance Overall balance assessment: Needs assistance;History of Falls       Postural control: Posterior lean Standing balance support: During functional activity;Bilateral upper extremity supported Standing balance-Leahy Scale: Poor Standing balance comment: pt with multiple bouts of posterior LOB; min (A) to manage and max cueing                              Pertinent Vitals/Pain Pain Assessment: 0-10 Pain Score: 8  Pain Location: Rt LE Pain Descriptors / Indicators: Aching;Constant Pain Intervention(s): Limited activity within patient's tolerance;Monitored during session;Premedicated before session;Repositioned    Home Living Family/patient expects to be discharged to:: Private residence Living Arrangements: Spouse/significant other Available Help at Discharge: Family;Available 24 hours/day Type of Home: House Home Access: Stairs to enter Entrance Stairs-Rails: Can reach both;Left;Right Entrance Stairs-Number of Steps: 3 Home Layout: Bed/bath upstairs;Two level;1/2 bath on main level Home Equipment: Walker - 4 wheels;Wheelchair -  manual;Cane - single point;Grab bars - toilet;Tub bench Additional Comments: Husband does some contract work but can be home 24/7 as needed by pt.    Prior Function  Level of Independence: Independent with assistive device(s)         Comments: Uses Left AFO and cane for ambulation in house. At times uses rollator. W/C for longer distances. Pt independent with ADLs.     Hand Dominance        Extremity/Trunk Assessment   Upper Extremity Assessment: Defer to OT evaluation           Lower Extremity Assessment: RLE deficits/detail (Lt LE weakness at baseline ) RLE Deficits / Details: quad 2+/5    Cervical / Trunk Assessment: Normal  Communication   Communication: No difficulties  Cognition Arousal/Alertness: Awake/alert Behavior During Therapy: WFL for tasks assessed/performed Overall Cognitive Status: Within Functional Limits for tasks assessed                      General Comments General comments (skin integrity, edema, etc.): pt became dizzy at end of ambulation; BP 86/41    Exercises Total Joint Exercises Ankle Circles/Pumps: AROM;15 reps;Both Quad Sets: AROM;Right;10 reps;Seated Long Arc Quad: Right;Strengthening;AAROM;10 reps;Seated      Assessment/Plan    PT Assessment Patient needs continued PT services  PT Diagnosis Difficulty walking;Acute pain;Generalized weakness   PT Problem List Decreased strength;Decreased range of motion;Decreased activity tolerance;Decreased balance;Decreased mobility;Decreased knowledge of use of DME;Pain;Decreased knowledge of precautions  PT Treatment Interventions DME instruction;Gait training;Stair training;Functional mobility training;Therapeutic activities;Therapeutic exercise;Balance training;Neuromuscular re-education;Patient/family education   PT Goals (Current goals can be found in the Care Plan section) Acute Rehab PT Goals Patient Stated Goal: to go home in 3 days PT Goal Formulation: With patient Time For Goal Achievement: 10/19/14 Potential to Achieve Goals: Fair    Frequency 7X/week   Barriers to discharge        Co-evaluation               End of  Session Equipment Utilized During Treatment: Gait belt;Right knee immobilizer Activity Tolerance: Patient limited by fatigue;Patient limited by pain;Other (comment) (dizziness) Patient left: in chair;with call bell/phone within reach;with nursing/sitter in room;with family/visitor present Nurse Communication: Mobility status;Other (comment) (BP)         Time: 1050-1120 PT Time Calculation (min) (ACUTE ONLY): 30 min   Charges:   PT Evaluation $Initial PT Evaluation Tier I: 1 Procedure PT Treatments $Gait Training: 8-22 mins   PT G CodesGustavus Bryant, [place] 10/12/2014, 12:05 PM

## 2014-10-12 NOTE — Evaluation (Signed)
Occupational Therapy Evaluation Patient Details Name: Tiffany [name] MRN: [id number] DOB: 29-Apr-1951 Today's Date: 10/12/2014    History of Present Illness Pt is a 64 y.o. Female s/p R TKA on 10/11/14. PMH DM, IBS, meningitis, colon polyps, spina bifida occulta, cataract s/p removal, dysrhythmia, skin cancer, osteoporosis, Left foot drop.    Clinical Impression   PTA pt lived at home with her husband and was independent with use of cane or rollator for ADLs. Pt uses w/c for long distances and wears LLE AFO at baseline for foot drop. Pt required min A to stand and ambulate to bathroom for toileting and requires assistance for LB ADLs due to decreased ROM and pain. Pt will benefit from acute OT to address functional mobility and ADLs as well at [facility] at d/c.     Follow Up Recommendations  Home health OT;Supervision/Assistance - 24 hour    Equipment Recommendations  None recommended by OT    Recommendations for Other Services       Precautions / Restrictions Precautions Precautions: Fall;Knee Precaution Comments: Reviewed knee precautions Required Braces or Orthoses: Other Brace/Splint;Knee Immobilizer - Right Other Brace/Splint: pt has AFO brace for LLE that she wears at baseline.  Restrictions Weight Bearing Restrictions: Yes RLE Weight Bearing: Weight bearing as tolerated      Mobility Bed Mobility Overal bed mobility: Needs Assistance Bed Mobility: Supine to Sit     Supine to sit: Min assist;HOB elevated     General bed mobility comments: Use of bed rail and min A to progress RLE to EOB. Pt able to use UEs to square off and scoot to EOB.   Transfers Overall transfer level: Needs assistance Equipment used: Rolling walker (2 wheeled) Transfers: Sit to/from Stand Sit to Stand: Mod assist         General transfer comment: Initially Mod A to stand from bed on lowest setting. VC's for hand placement. Min A to sit on elevated 3N1. VC's for sequencing.          ADL  Overall ADL's : Needs assistance/impaired Eating/Feeding: Independent;Sitting   Grooming: Set up;Sitting   Upper Body Bathing: Set up;Sitting   Lower Body Bathing: Moderate assistance;Sit to/from stand   Upper Body Dressing : Set up;Sitting   Lower Body Dressing: Maximal assistance;Sit to/from stand   Toilet Transfer: Minimal assistance;Ambulation;RW (BSC over toilet)           Functional mobility during ADLs: Minimal assistance;Rolling walker General ADL Comments: Pt slow to move. Her shoes are in the car, so she was unable to wear her Left AFO but was able to ambulate without safely.      Vision  Pt reports no change from baseline.           Pertinent Vitals/Pain Pain Assessment: 0-10 Pain Score: 7  Pain Location: RLE with ambulation and movement Pain Descriptors / Indicators: Aching;Sore;Throbbing Pain Intervention(s): Limited activity within patient's tolerance;Monitored during session;Repositioned        Extremity/Trunk Assessment Upper Extremity Assessment Upper Extremity Assessment: Overall WFL for tasks assessed   Lower Extremity Assessment Lower Extremity Assessment: Defer to PT evaluation   Cervical / Trunk Assessment Cervical / Trunk Assessment: Normal   Communication Communication Communication: No difficulties   Cognition Arousal/Alertness: Awake/alert Behavior During Therapy: WFL for tasks assessed/performed Overall Cognitive Status: Within Functional Limits for tasks assessed  Home Living Family/patient expects to be discharged to:: Private residence Living Arrangements: Spouse/significant other Available Help at Discharge: Family;Available 24 hours/day Type of Home: House Home Access: Stairs to enter [employer] of Steps: 2   Home Layout: Bed/bath upstairs;Two level;1/2 bath on main level Alternate Level Stairs-Number of Steps: has a chair lift to get upstairs   Bathroom Shower/Tub:  Tub/shower unit   Bathroom Toilet: Handicapped height     Home Equipment: Environmental consultant - 4 wheels;Wheelchair - manual;Cane - single point;Grab bars - toilet;Tub bench          Prior Functioning/Environment Level of Independence: Independent with assistive device(s)        Comments: Uses Left AFO and cane for ambulation in house. At times uses rollator. W/C for longer distances. Pt independent with ADLs.    OT Diagnosis: Generalized weakness;Acute pain   OT Problem List: Decreased strength;Decreased range of motion;Decreased activity tolerance;Impaired balance (sitting and/or standing);Pain   OT Treatment/Interventions: Self-care/ADL training;Therapeutic exercise;Energy conservation;DME and/or AE instruction;Therapeutic activities;Patient/family education;Balance training    OT Goals(Current goals can be found in the care plan section) Acute Rehab OT Goals Patient Stated Goal: to get back to independence OT Goal Formulation: With patient Time For Goal Achievement: 10/26/14 Potential to Achieve Goals: Good ADL Goals Pt Will Perform Lower Body Bathing: with min assist;sit to/from stand Pt Will Perform Lower Body Dressing: with min assist;sit to/from stand Pt Will Transfer to Toilet: with supervision;ambulating;bedside commode Pt Will Perform Toileting - Clothing Manipulation and hygiene: with supervision;sit to/from stand  OT Frequency: Min 2X/week    End of Session Equipment Utilized During Treatment: Gait belt;Rolling walker;Right knee immobilizer CPM Right Knee CPM Right Knee: Off  Activity Tolerance: Patient tolerated treatment well Patient left: Other (comment) (on 3N1 over toilet with call string; PT in room)   Time: 1014-1050 OT Time Calculation (min): 36 min Charges:  OT General Charges $OT Visit: 1 Procedure OT Evaluation $Initial OT Evaluation Tier I: 1 Procedure OT Treatments $Self Care/Home Management : 8-22 mins G-Codes:    Juluis Rainier 10/28/2014, 11:21  AM  Cyndie Chime, OTR/L Occupational Therapist [phone] (pager)

## 2014-10-12 NOTE — Progress Notes (Signed)
PT Cancellation Note  Patient Details Name: Tiffany [name] MRN: [id number] DOB: 02-21-1951   Cancelled Treatment:    Reason Eval/Treat Not Completed: Patient declined, no reason specified. Pt had just returned to bed. Reports she has been up mobilizing with nursing to/from bathroom. Encouraged OOB with nursing to/from chair to increase activity, pt agreeable.    Elie Confer [place], [employer] 10/12/2014, 4:30 PM

## 2014-10-12 NOTE — Progress Notes (Signed)
10/12/14 Set up with [facility] for HHPT, OT by MD office. Spoke with patient, no change in d/c plan. Contacted Frank with [facility] and requested rolling walker and 3N1 be delivered to patient's room.

## 2014-10-13 ENCOUNTER — Encounter (HOSPITAL_COMMUNITY): Payer: Self-pay | Admitting: Orthopaedic Surgery

## 2014-10-13 LAB — GLUCOSE, CAPILLARY
Glucose-Capillary: 109 mg/dL — ABNORMAL HIGH (ref 70–99)
Glucose-Capillary: 128 mg/dL — ABNORMAL HIGH (ref 70–99)
Glucose-Capillary: 122 mg/dL — ABNORMAL HIGH (ref 70–99)
Glucose-Capillary: 140 mg/dL — ABNORMAL HIGH (ref 70–99)

## 2014-10-13 LAB — CBC
HEMATOCRIT: 26.4 % — AB (ref 36.0–46.0)
Hemoglobin: 8.6 g/dL — ABNORMAL LOW (ref 12.0–15.0)
MCH: 30.5 pg (ref 26.0–34.0)
MCHC: 32.6 g/dL (ref 30.0–36.0)
MCV: 93.6 fL (ref 78.0–100.0)
Platelets: 141 10*3/uL — ABNORMAL LOW (ref 150–400)
RBC: 2.82 MIL/uL — AB (ref 3.87–5.11)
RDW: 13.1 % (ref 11.5–15.5)
WBC: 7.3 10*3/uL (ref 4.0–10.5)

## 2014-10-13 MED ORDER — SODIUM CHLORIDE 0.9 % IV BOLUS (SEPSIS)
500.0000 mL | Freq: Once | INTRAVENOUS | Status: AC
  Administered 2014-10-13: 500 mL via INTRAVENOUS

## 2014-10-13 NOTE — Progress Notes (Signed)
Was called in by [place] PT into the room. PT stated that as they were coming out of the bathroom the patient stated she felt dizzy and had numbness in her tongue, L jaw, and Larm.  Pt blood pressure once back in the chair was 110/64. Pt also was slower to respond and could not tell us where she was. Called rapid response and MD made aware.

## 2014-10-13 NOTE — Progress Notes (Signed)
Orthopedic Tech Progress Note Patient Details:  Tiffany [name] 03/20/1951 [id number] Patient placed in CPM CPM Right Knee CPM Right Knee: On Right Knee Flexion (Degrees): 45 Right Knee Extension (Degrees): 0 Additional Comments: trapeze bar patient helper   [place] R Thompson 10/13/2014, 3:20 PM

## 2014-10-13 NOTE — Progress Notes (Signed)
Physical Therapy Treatment Patient Details Name: MARKERIA GOETSCH MRN: [id number] DOB: Jan 07, 1951 Today's Date: 10/13/2014    History of Present Illness Pt is a 64 y.o. Female s/p R TKA on 10/11/14. PMH DM, IBS, meningitis, colon polyps, spina bifida occulta, cataract s/p removal, dysrhythmia, skin cancer, osteoporosis, Left foot drop.     PT Comments    Pt up in bathroom upon PT arrival. Pt became very dizzy and limited to SPT only this session. RN and rapid called due to pt becoming very dizzy, disoriented and c/o tongue and Lt facial numbness. BP in chair 103/51; BP when returned to bed 110/54. Recommend 2 person (A) for safety at this time. Will cont to follow per POC. D/C disposition updated due to lack of progress.   Follow Up Recommendations  SNF;Supervision/Assistance - 24 hour     Equipment Recommendations  Rolling walker with 5" wheels    Recommendations for Other Services       Precautions / Restrictions Precautions Precautions: Fall;Knee Precaution Comments: Reviewed knee precautions Required Braces or Orthoses: Other Brace/Splint;Knee Immobilizer - Right Knee Immobilizer - Right: On at all times Other Brace/Splint: pt has AFO brace for LLE that she wears at baseline.  Restrictions Weight Bearing Restrictions: Yes RLE Weight Bearing: Weight bearing as tolerated    Mobility  Bed Mobility Overal bed mobility: Needs Assistance Bed Mobility: Sit to Supine       Sit to supine: Max assist   General bed mobility comments: pt with difficulty following commands; returned to bed quickly and max (A) due to dizziness  Transfers Overall transfer level: Needs assistance Equipment used: Rolling walker (2 wheeled) Transfers: Sit to/from [facility] Sit to Stand: Mod assist Stand pivot transfers: Max assist       General transfer comment: pt unable to mobilize due to dizziness; performed SPT from [facility] to chair then back to bed; 2 person helpful for safety;  pt (A) very minimal with transfers this session due to dizziness; pt keeping trunk flexed and unable to achieve upright position   Ambulation/Gait             General Gait Details: pivotal steps only due to dizziness    Stairs            Wheelchair Mobility    Modified Rankin (Stroke Patients Only)       Balance                                    Cognition Arousal/Alertness: Awake/alert Behavior During Therapy: Flat affect Overall Cognitive Status: Impaired/Different from baseline Area of Impairment: Orientation;Problem solving;Awareness;Following commands;Safety/judgement Orientation Level: Disoriented to;Place;Situation     Following Commands: Follows one step commands with increased time   Awareness: Intellectual Problem Solving: Slow processing;Decreased initiation;Requires tactile cues;Requires verbal cues General Comments: pt became disoriented during session; RN and rapid response aware    Exercises Total Joint Exercises Ankle Circles/Pumps: AAROM;Both;10 reps    General Comments General comments (skin integrity, edema, etc.): see impression statement; Rapid response called      Pertinent Vitals/Pain Pain Assessment: 0-10 Pain Score: 10-Worst pain ever Pain Location: Rt LE Pain Descriptors / Indicators: Aching Pain Intervention(s): Monitored during session;Repositioned;Premedicated before session    Home Living                      Prior Function  PT Goals (current goals can now be found in the care plan section) Acute Rehab PT Goals Patient Stated Goal: none stated PT Goal Formulation: With patient Time For Goal Achievement: 10/19/14 Potential to Achieve Goals: Fair Progress towards PT goals: Not progressing toward goals - comment (due to dizziness)    Frequency  7X/week    PT Plan Discharge plan needs to be updated    Co-evaluation             End of Session Equipment Utilized During  Treatment: Gait belt;Right knee immobilizer Activity Tolerance: Patient limited by fatigue;Other (comment) (dizziness/ orthostatic ? ) Patient left: in bed;with call bell/phone within reach;with nursing/sitter in room     Time: 3779-3968 PT Time Calculation (min) (ACUTE ONLY): 32 min  Charges:  $Therapeutic Activity: 23-37 mins                    G Codes:      Danis [place], [place] N Oct 20, 2014, 12:05 PM

## 2014-10-13 NOTE — Significant Event (Signed)
Rapid Response Event Note  Overview: Time Called: 2811 Arrival Time: 1105 Event Type: Hypotension, Neurologic  Initial Focused Assessment: Patient up to bathroom with RN then assisted to the chair by PT.  Patient became dizzy and was confused.  She also had some numbness and tingling of her extremities.  Upon my arrival patient sitting up right in the recliner.  She states that she feels "funny in the head", dizzy and light headed, she denies numbness.  She has equal grip strength.  Initially seemed a bit confused but able to orient. She is pale.  Lung sounds clear heart tones regular.  BP 103/45  HR 117, O2 sat 85-91% on RA.  Interventions: MD noitified:  500cc NS bolus Assisted patient to bed Placed on 2l [place]  O2 sat 95% Patient states she feels better lying down, but still has some dizziness  12:00 patient states she feels much better.  Sister at bedside, patient able to converse normally RN to call if assistance needed.  Event Summary: Name of Physician Notified: Ninfa Linden at 1110    at    Outcome: Stayed in room and stabalized  Event End Time: 1203  Raliegh Ip

## 2014-10-13 NOTE — Progress Notes (Signed)
Patient ID: Tiffany [name], female   DOB: 22-Mar-1951, 64 y.o.   MRN: [id number] I did come by to check on [name] given the issues she was having earlier today.  Her husband is now at the bedside.  She seems to be doing well now cognitively and denies weakness or numbness/tingling.  Her current vitals are stable and she answers questions and follows commands appropriately.  Will re-assess in am.  Possible home still tomorrow afternoon.

## 2014-10-13 NOTE — Discharge Instructions (Signed)
Ice and elevation right knee and leg for swelling. Full weight as tolerated right leg. Work aggressively on knee range of motion. You can get your current knee dressing wet in the shower. Leave your current dressing on and in place until your outpatient follow-up.

## 2014-10-13 NOTE — Progress Notes (Signed)
Physical Therapy Treatment Patient Details Name: Tiffany [name] MRN: [id number] DOB: 05-22-1951 Today's Date: 10/13/2014    History of Present Illness Pt is a 64 y.o. Female s/p R TKA on 10/11/14. PMH DM, IBS, meningitis, colon polyps, spina bifida occulta, cataract s/p removal, dysrhythmia, skin cancer, osteoporosis, Left foot drop.     PT Comments    Pt continues to be confused and disoriented during session. RN made aware. BP supine 108/39; sitting EOB 100/76, standing 115/60 and after activity while sitting 115/56. Pt continues to c/o dizziness with mobility. Requires 2 person (A) at this time for safety. Cont to recommend SNF for post acute rehab.   Follow Up Recommendations  SNF;Supervision/Assistance - 24 hour     Equipment Recommendations  Rolling walker with 5" wheels    Recommendations for Other Services       Precautions / Restrictions Precautions Precautions: Fall;Knee Precaution Comments: Reviewed knee precautions Required Braces or Orthoses: Other Brace/Splint;Knee Immobilizer - Right Knee Immobilizer - Right: On at all times Other Brace/Splint: pt has AFO brace for LLE that she wears at baseline.  Restrictions Weight Bearing Restrictions: Yes RLE Weight Bearing: Weight bearing as tolerated    Mobility  Bed Mobility Overal bed mobility: Needs Assistance Bed Mobility: Supine to Sit     Supine to sit: Mod assist;HOB elevated     General bed mobility comments: pt losing balance when attempting to sit EOB multiple times; (A) to elevate trunk and balance at EOB; max multimodal cues for sequencing   Transfers Overall transfer level: Needs assistance Equipment used: Rolling walker (2 wheeled) Transfers: Sit to/from [facility] Sit to Stand: +2 physical assistance;+2 safety/equipment;Mod assist Stand pivot transfers: Mod assist;+2 physical assistance       General transfer comment: pt cont to c/o dizziness; 2 person (A) for safety;pt required  (A) to facilitate pivotal steps to chair; pt cont to flex trunk; max cues for upright posture  Ambulation/Gait             General Gait Details: limited to pivotal steps only due to dizziness   Stairs            Wheelchair Mobility    Modified Rankin (Stroke Patients Only)       Balance Overall balance assessment: Needs assistance;History of Falls Sitting-balance support: Feet supported;Bilateral upper extremity supported Sitting balance-Leahy Scale: Poor Sitting balance - Comments: multiple bouts of LOB; (A) to maintain upright position initially; progressed to sitting EOB without physical (A) with incr time and UE support Postural control: Posterior lean Standing balance support: During functional activity;Bilateral upper extremity supported Standing balance-Leahy Scale: Zero Standing balance comment: 2 person (A) for balance                     Cognition Arousal/Alertness: Awake/alert Behavior During Therapy: Flat affect Overall Cognitive Status: Impaired/Different from baseline Area of Impairment: Orientation;Memory;Following commands;Problem solving Orientation Level: Disoriented to;Situation;Place   Memory: Decreased short-term memory Following Commands: Follows one step commands with increased time     Problem Solving: Slow processing;Decreased initiation;Requires tactile cues;Requires verbal cues General Comments: sister present and reported pt has been confused when conversating with her; pt continues to require incr time to follow commands and is disoriented     Exercises Total Joint Exercises Ankle Circles/Pumps: AAROM;Both;10 reps Quad Sets: AROM;Right;10 reps;Seated Heel Slides: AAROM;Right;5 reps;Seated Hip ABduction/ADduction: AAROM;Right;10 reps;Seated Goniometric ROM: pt limited by pain; AROM -5 to 60 degrees    General Comments General comments (skin integrity,  edema, etc.): see impression      Pertinent Vitals/Pain Pain  Assessment: Faces Faces Pain Scale: Hurts whole lot Pain Location: Rt knee with exercises and movement  Pain Descriptors / Indicators: Grimacing Pain Intervention(s): Monitored during session;Premedicated before session;Repositioned;Ice applied    Home Living                      Prior Function            PT Goals (current goals can now be found in the care plan section) Acute Rehab PT Goals Patient Stated Goal: none stated PT Goal Formulation: With patient Time For Goal Achievement: 10/19/14 Potential to Achieve Goals: Fair Progress towards PT goals: Progressing toward goals    Frequency  7X/week    PT Plan Current plan remains appropriate    Co-evaluation             End of Session Equipment Utilized During Treatment: Gait belt;Right knee immobilizer Activity Tolerance: Patient limited by fatigue;Patient limited by pain Patient left: in chair;with call bell/phone within reach;with nursing/sitter in room;with family/visitor present     Time: 9373-4287 PT Time Calculation (min) (ACUTE ONLY): 24 min  Charges:  $Therapeutic Exercise: 8-22 mins $Therapeutic Activity: 8-22 mins                    G CodesGustavus Bryant, [place]  [phone] 10/13/2014, 4:21 PM

## 2014-10-13 NOTE — Progress Notes (Signed)
Subjective: 2 Days Post-Op Procedure(s) (LRB): RIGHT TOTAL KNEE ARTHROPLASTY (Right) Patient reports pain as moderate.    Objective: Vital signs in last 24 hours: Temp:  [98.2 F (36.8 C)-101.2 F (38.4 C)] 98.2 F (36.8 C) (02/18 0440) Pulse Rate:  [84-86] 84 (02/18 0440) Resp:  [16-18] 17 (02/18 0440) BP: (100-117)/(44-52) 100/44 mmHg (02/18 0440) SpO2:  [97 %-100 %] 97 % (02/18 0440)  Intake/Output from previous day: 02/17 0701 - 02/18 0700 In: 720 [P.O.:720] Out: -  Intake/Output this shift:     Recent Labs  10/12/14 0552  HGB 9.6*    Recent Labs  10/12/14 0552  WBC 5.7  RBC 3.22*  HCT 30.5*  PLT 132*    Recent Labs  10/12/14 0552  NA 140  K 4.7  CL 104  CO2 32  BUN 11  CREATININE 1.18*  GLUCOSE 128*  CALCIUM 8.2*    Recent Labs  10/11/14 1039  INR 0.98    Sensation intact distally Intact pulses distally Dorsiflexion/Plantar flexion intact Incision: scant drainage No cellulitis present Compartment soft  Assessment/Plan: 2 Days Post-Op Procedure(s) (LRB): RIGHT TOTAL KNEE ARTHROPLASTY (Right) Up with therapy Plan for discharge tomorrow  Mcarthur Rossetti 10/13/2014, 7:10 AM

## 2014-10-14 ENCOUNTER — Telehealth: Payer: Self-pay | Admitting: Internal Medicine

## 2014-10-14 LAB — GLUCOSE, CAPILLARY
GLUCOSE-CAPILLARY: 177 mg/dL — AB (ref 70–99)
GLUCOSE-CAPILLARY: 113 mg/dL — AB (ref 70–99)
Glucose-Capillary: 110 mg/dL — ABNORMAL HIGH (ref 70–99)
Glucose-Capillary: 148 mg/dL — ABNORMAL HIGH (ref 70–99)

## 2014-10-14 LAB — ABO/RH: ABO/RH(D): O POS

## 2014-10-14 LAB — CBC
HCT: 23.4 % — ABNORMAL LOW (ref 36.0–46.0)
Hemoglobin: 7.5 g/dL — ABNORMAL LOW (ref 12.0–15.0)
MCH: 29.6 pg (ref 26.0–34.0)
MCHC: 32.1 g/dL (ref 30.0–36.0)
MCV: 92.5 fL (ref 78.0–100.0)
PLATELETS: 127 10*3/uL — AB (ref 150–400)
RBC: 2.53 MIL/uL — AB (ref 3.87–5.11)
RDW: 13.2 % (ref 11.5–15.5)
WBC: 6.3 10*3/uL (ref 4.0–10.5)

## 2014-10-14 LAB — PREPARE RBC (CROSSMATCH)

## 2014-10-14 MED ORDER — SODIUM CHLORIDE 0.9 % IV SOLN: Freq: Once | INTRAVENOUS | Status: DC

## 2014-10-14 MED ORDER — TRAMADOL HCL 50 MG PO TABS: 100.0000 mg | ORAL_TABLET | Freq: Four times a day (QID) | ORAL | Status: DC | PRN

## 2014-10-14 MED ORDER — RIVAROXABAN 10 MG PO TABS: 10.0000 mg | ORAL_TABLET | Freq: Every day | ORAL | Status: DC

## 2014-10-14 MED ORDER — ONDANSETRON 4 MG PO TBDP: 4.0000 mg | ORAL_TABLET | Freq: Three times a day (TID) | ORAL | Status: DC | PRN

## 2014-10-14 MED ORDER — METHOCARBAMOL 500 MG PO TABS: 500.0000 mg | ORAL_TABLET | Freq: Four times a day (QID) | ORAL | Status: DC | PRN

## 2014-10-14 MED ORDER — ONDANSETRON HCL 4 MG PO TABS: 4.0000 mg | ORAL_TABLET | Freq: Four times a day (QID) | ORAL | Status: DC | PRN

## 2014-10-14 NOTE — Progress Notes (Signed)
Patient ID: Tiffany [name], female   DOB: 1950-10-02, 64 y.o.   MRN: [id number] Looks better today overall.  Vitals stable.  Labs not back yet.  Right knee dressing intact.  Can discharge to home likely this afternoon.

## 2014-10-14 NOTE — Discharge Planning (Signed)
Spoke to patient and she advised me that she already has a walker.

## 2014-10-14 NOTE — Clinical Social Work Placement (Signed)
Clinical Social Work Department CLINICAL SOCIAL WORK PLACEMENT NOTE 10/14/2014  Patient:  Tiffany [name], Tiffany [name]  Account Number:  [id number] Admit date:  10/11/2014  Clinical Social Worker:  Delrae Sawyers  Date/time:  10/14/2014 02:26 PM  Clinical Social Work is seeking post-discharge placement for this patient at the following level of care:   [place]   (*CSW will update this form in Epic as items are completed)   10/14/2014  Patient/family provided with [place] Department of Clinical Social Work's list of facilities offering this level of care within the geographic area requested by the patient (or if unable, by the patient's family).  10/14/2014  Patient/family informed of their freedom to choose among providers that offer the needed level of care, that participate in Medicare, Medicaid or managed care program needed by the patient, have an available bed and are willing to accept the patient.  10/14/2014  Patient/family informed of [facility]' ownership interest in [facility], as well as of the fact that they are under no obligation to receive care at this facility.  PASARR submitted to EDS on 10/14/2014 PASARR number received on 10/14/2014  FL2 transmitted to all facilities in geographic area requested by pt/family on  10/14/2014 FL2 transmitted to all facilities within larger geographic area on   Patient informed that his/her managed care company has contracts with or will negotiate with  certain facilities, including the following:     Patient/family informed of bed offers received:   Patient chooses bed at  Physician recommends and patient chooses bed at    Patient to be transferred to  on   Patient to be transferred to facility by  Patient and family notified of transfer on  Name of family member notified:    The following physician request were entered in Epic:   Additional Comments:  [place] ([phone]) Licensed Clinical  Social Worker Orthopedics [phone]) and Surgical [phone])

## 2014-10-14 NOTE — Progress Notes (Addendum)
Occupational Therapy Treatment Patient Details Name: Tiffany [name] MRN: [id number] DOB: [birth year] Today's Date: 10/14/2014    History of present illness Pt is a 64 y.o. Female s/p R TKA on 10/11/14. PMH DM, IBS, meningitis, colon polyps, spina bifida occulta, cataract s/p removal, dysrhythmia, skin cancer, osteoporosis, Left foot drop.    OT comments  Pt seen today for functional mobility and ADLs. Pt presents with deconditioning, generalized weakness, and UE soreness from use of RW. Pt is a high fall risk due to difficulty with mobility. Pt does not have enough strength to clear LEs from floor fully when ambulating. Feel that pt is not safe to d/c home and this time and strongly recommending SNF at this time for ST Rehab to increase safety prior to return home. Pt has two steps to get inside her house and will have her husband to assist, however requires +2 for safety during ambulation at this time due to decreased endurance. Educated pt on fall prevention and energy conservation. If pt is unable to go to SNF, will need HHOT, HHAide at d/c.    Follow Up Recommendations  SNF;Supervision/Assistance - 24 hour (HHOT if not SNF)   Equipment Recommendations  Tub/shower bench   Recommendations for Other Services      Precautions / Restrictions Precautions Precautions: Fall;Knee Precaution Comments: Reviewed knee precautions Required Braces or Orthoses: Other Brace/Splint;Knee Immobilizer - Right Knee Immobilizer - Right: On at all times Other Brace/Splint: pt has AFO brace for LLE that she wears at baseline.  Restrictions Weight Bearing Restrictions: Yes RLE Weight Bearing: Weight bearing as tolerated       Mobility Bed Mobility Overal bed mobility: Needs Assistance Bed Mobility: Supine to Sit     Supine to sit: Mod assist;HOB elevated     General bed mobility comments: Pt utilized gait belt as leg lifter, but continues to require assist to progress RLE to EOB due to UE weakness.  Pt also required hand held assist to pull to sitting, even with HOB elevated. Pt does have elevating bed at home, but feel that pt would be unable to get OOB independently to get to the bathroom if needed. Pt required VC's for sequencing and demonstrated some difficulty with problem solving.   Transfers Overall transfer level: Needs assistance Equipment used: Rolling walker (2 wheeled) Transfers: Sit to/from Stand Sit to Stand: Mod assist         General transfer comment: Mod A to stand from both low and elevated surfaces due to generalized weakness. Pt attempting to rest forearms on RW front brace bar to stand and demonstrating unsafe techniques by pulling on RW. Assist needed to support RW to maintain it upright.     Balance Overall balance assessment: Needs assistance Sitting-balance support: Bilateral upper extremity supported;Feet supported Sitting balance-Leahy Scale: Fair     Standing balance support: Bilateral upper extremity supported;During functional activity Standing balance-Leahy Scale: Poor Standing balance comment: Relies heavily on Bil UE support on RW.                   ADL Overall ADL's : Needs assistance/impaired             Lower Body Bathing: Moderate assistance;Sit to/from stand       Lower Body Dressing: Maximal assistance;Sit to/from stand   Toilet Transfer: Ambulation;Moderate assistance;Comfort height toilet;Grab bars;RW Toilet Transfer Details (indicate cue type and reason): Pt ambulated very slowly to toilet and required mod A for sit<>stand from toilet. Pt continues to require  VC's for safe technique and tactile cues to follow through as she tends to lean her forearms on the rolling walker to stand. Toileting- Clothing Manipulation and Hygiene: Sit to/from stand;Maximal assistance Toileting - Clothing Manipulation Details (indicate cue type and reason): pt performed toilet hygiene in sitting but required Max A to pull up briefs due to need  for Bil UE support on RW.     Functional mobility during ADLs: Minimal assistance;Rolling walker General ADL Comments: Pt is very deconditioned and presents with UE soreness and weakness from use with RW. Pt takes very slow, small steps and is unable to fully clear either LE off floor when ambulating, making pt a fall risk. Pt requires Max assist with LB ADLs and mod A to stand. Continues to c/o mild dizziness when standing with BP in standing 128/47.                Cognition  Arousal/Alertness: Awake/Alert Behavior During Therapy: Anxious Overall Cognitive Status: Within Functional Limits for tasks assessed                                    Pertinent Vitals/ Pain       Pain Assessment: 0-10 Pain Score: 7  Pain Location: Rt knee with movement Pain Descriptors / Indicators: Aching;Grimacing Pain Intervention(s): Limited activity within patient's tolerance;Monitored during session;Repositioned         Frequency Min 2X/week     Progress Toward Goals  OT Goals(current goals can now be found in the care plan section)  Progress towards OT goals: Not progressing toward goals - comment  Acute Rehab OT Goals Patient Stated Goal: none stated OT Goal Formulation: With patient Time For Goal Achievement: 10/26/14 Potential to Achieve Goals: Good  Plan Discharge plan needs to be updated       End of Session Equipment Utilized During Treatment: Gait belt;Rolling walker;Right knee immobilizer;Other (comment) (LLE brace)   Activity Tolerance Patient limited by fatigue   Patient Left in chair;with call bell/phone within reach;with family/visitor present;Other (comment) (with PT arriving)   Nurse Communication Other (comment);Mobility status (need for SNF)        Time: 0827-0920 OT Time Calculation (min): 53 min  Charges: OT General Charges $OT Visit: 1 Procedure OT Treatments $Self Care/Home Management : 23-37 mins $Therapeutic Activity: 8-22 mins  Juluis Rainier 10/14/2014, 9:40 AM  Cyndie Chime, OTR/L Occupational Therapist [phone] (pager)

## 2014-10-14 NOTE — Progress Notes (Signed)
PT Cancellation Note  Patient Details Name: Tiffany [name] MRN: [id number] DOB: 06/13/1951   Cancelled Treatment:    Reason Eval/Treat Not Completed: Patient not medically ready. Pt with HgB of 7.5 and symptomatic. Planned for transfusion this afternoon. Will hold until transfusion complete.    Elie Confer [place], [place] 10/14/2014, 2:29 PM

## 2014-10-14 NOTE — Progress Notes (Signed)
Patient ID: Tiffany [name], female   DOB: 06/21/1951, 64 y.o.   MRN: [id number] I plan to transfuse one unit PRBCs due to symptomatic acute blood loss anemia from surgery.  She is a little tachycardic and lightheaded.

## 2014-10-14 NOTE — Progress Notes (Signed)
Physical Therapy Treatment Patient Details Name: Tiffany [name] MRN: [id number] DOB: 01-31-1951 Today's Date: 10/14/2014    History of Present Illness Pt is a 64 y.o. Female s/p R TKA on 10/11/14. PMH DM, IBS, meningitis, colon polyps, spina bifida occulta, cataract s/p removal, dysrhythmia, skin cancer, osteoporosis, Left foot drop.     PT Comments    Pt mobilizing very slowly today. Pt cont to c/o lightheadedness with mobility. Pt is an incr risk of falls. Husband present during session. Discussed D/C recommendations with pt and husband. Both uncertain as to if they wish to pursue SNF.   Follow Up Recommendations  SNF;Supervision/Assistance - 24 hour     Equipment Recommendations  Rolling walker with 5" wheels    Recommendations for Other Services       Precautions / Restrictions Precautions Precautions: Fall;Knee Precaution Comments: Reviewed knee precautions Required Braces or Orthoses: Other Brace/Splint;Knee Immobilizer - Right Knee Immobilizer - Right: On at all times Other Brace/Splint: pt has AFO brace for LLE that she wears at baseline.  Restrictions Weight Bearing Restrictions: Yes RLE Weight Bearing: Weight bearing as tolerated    Mobility  Bed Mobility Overal bed mobility: Needs Assistance Bed Mobility: Supine to Sit     Supine to sit: Mod assist;HOB elevated     General bed mobility comments: pt up in chair   Transfers Overall transfer level: Needs assistance Equipment used: Rolling walker (2 wheeled) Transfers: Sit to/from Stand Sit to Stand: Mod assist         General transfer comment: pt with difficulty inititating transfers; requires mod (A) to power up and balance; husband present to observe guarding technique   Ambulation/Gait Ambulation/Gait assistance: Min assist Ambulation Distance (Feet): 90 Feet Assistive device: Rolling walker (2 wheeled) Gait Pattern/deviations: Step-through pattern;Decreased stride length;Narrow base of  support Gait velocity: very decr Gait velocity interpretation: Below normal speed for age/gender General Gait Details: cues for step through gt and safety with RW; pt with very decr gt speed and has difficulty clearing Rt LE at times; min (A) to balance    Stairs Stairs: Yes Stairs assistance: Mod assist Stair Management: No rails;Step to pattern;Backwards;With walker Number of Stairs: 2 General stair comments: husband present and (A) for family education; educated on technique and management; mod (A) to balance and manage RW  Wheelchair Mobility    Modified Rankin (Stroke Patients Only)       Balance Overall balance assessment: Needs assistance Sitting-balance support: Feet supported;No upper extremity supported Sitting balance-Leahy Scale: Fair     Standing balance support: During functional activity;Bilateral upper extremity supported Standing balance-Leahy Scale: Poor Standing balance comment: RW and (A) to balance                     Cognition Arousal/Alertness: Awake/alert Behavior During Therapy: Flat affect Overall Cognitive Status: Within Functional Limits for tasks assessed Area of Impairment: Problem solving;Attention   Current Attention Level: Selective Memory: Decreased short-term memory Following Commands: Follows one step commands with increased time     Problem Solving: Slow processing;Decreased initiation;Requires tactile cues;Requires verbal cues General Comments: pt oriented today; continues to have flat affect and rquires incr time for all motor activity    Exercises Total Joint Exercises Ankle Circles/Pumps: AAROM;Both;10 reps Quad Sets: AROM;Right;10 reps;Seated Heel Slides: AAROM;10 reps;Seated Hip ABduction/ADduction: AAROM;Right;10 reps;Seated Goniometric ROM: -5 to 40 degrees; limited by pain    General Comments General comments (skin integrity, edema, etc.): discussed D/C disposition with pt and husband at length  Pertinent Vitals/Pain Pain Assessment: No/denies pain Pain Score: 7  Pain Location: pt denied pain but would grimace with movement of Rt LE  Pain Descriptors / Indicators: Grimacing Pain Intervention(s): Monitored during session;Premedicated before session;Repositioned    Home Living                      Prior Function            PT Goals (current goals can now be found in the care plan section) Acute Rehab PT Goals Patient Stated Goal: to go home PT Goal Formulation: With patient Time For Goal Achievement: 10/19/14 Potential to Achieve Goals: Fair Progress towards PT goals: Progressing toward goals    Frequency  7X/week    PT Plan Current plan remains appropriate    Co-evaluation             End of Session Equipment Utilized During Treatment: Gait belt;Right knee immobilizer Activity Tolerance: Patient limited by fatigue Patient left: in chair;with call bell/phone within reach;with family/visitor present     Time: 5366-4403 PT Time Calculation (min) (ACUTE ONLY): 24 min  Charges:  $Gait Training: 8-22 mins $Therapeutic Exercise: 8-22 mins                    G CodesElie Confer [place], [place]  [phone] 10/14/2014, 11:11 AM

## 2014-10-14 NOTE — Progress Notes (Signed)
CARE MANAGEMENT NOTE 10/14/2014  Patient:  Tiffany [name], Tiffany [name]   Account Number:  [id number]  Date Initiated:  10/12/2014  Documentation initiated by:  [facility]  Subjective/Objective Assessment:   s/p rt TKA     Action/Plan:   PT/OT evals- recommended HPT and HHOT. PT changed recommendation to SNF 10/14/14   Anticipated DC Date:  10/13/2014   Anticipated DC Plan:  [facility]  In-house referral  Clinical Social Worker      DC Planning Services  CM consult      PAC Choice  [place]   Choice offered to / List presented to:  C-1 Patient   DME arranged  3-N-1  Vassie Moselle      DME agency  [place] arranged  [place]   Status of service:  In process, will continue to follow Medicare Important Message given?  YES Medicare IM given by:  [facility]  Discharge Disposition:  [place]  Per UR Regulation:  Reviewed for med. necessity/level of care/duration of stay   Comments:  10/14/14 PT changed recommendation to SNF today. Spoke with patient and her husband, they still would like for her to go home but want to speak with CSW so that SNF can be an option. Explained that I will not cancel the HHPT so that if she decides to go home, [facility] Go will see her the day after d/c. Contacted CSW, she will speak with patient this pm.  10/12/14 Set up with Ar[facility] Go [facility] for HHPT, OT by MD office. Spoke with patient, no change in d/c plan. Contacted Frank with [facility] and requested rolling walker and 3N1 be delivered to patient's room.

## 2014-10-14 NOTE — Clinical Social Work Psychosocial (Signed)
Clinical Social Work Department BRIEF PSYCHOSOCIAL ASSESSMENT 10/14/2014  Patient:  Tiffany [name], Tiffany [name]     Account Number:  [id number]     Admit date:  10/11/2014  Clinical Social Worker:  Delrae Sawyers  Date/Time:  10/14/2014 02:19 PM  Referred by:  Physician  Date Referred:  10/14/2014 Referred for  SNF Placement   Other Referral:   none.   Interview type:  Patient Other interview type:   none.    PSYCHOSOCIAL DATA Living Status:  HUSBAND Admitted from facility:   Level of care:   Primary support name:  Akemi Overholser Primary support relationship to patient:  SPOUSE Degree of support available:   Strong support system.    CURRENT CONCERNS Current Concerns  Post-Acute Placement   Other Concerns:   none.    SOCIAL WORK ASSESSMENT / PLAN CSW received referral from [facility] stating patient's discharge disposition has changed from home with home health to SNF. Patient to be given one unit of blood on 10/14/2014 and reassessed regarding discharge disposition.    CSW met with patient at bedside to discuss discharge disposition. Patient informed CSW patient would prefer to discharge to home with patient's husband, but patient is concerned regarding patient's weakness and fatigue. CSW to continue to assist patient with possible SNF placement on 10/15/2014. Patient expressed hopefulness that patient will be more energized after receiving unit of blood and be able to discharge home with home health.   Assessment/plan status:  Psychosocial Support/Ongoing Assessment of Needs Other assessment/ plan:   none.   Information/referral to community resources:   [facility] bed offers.    PATIENT'S/FAMILY'S RESPONSE TO PLAN OF CARE: Patient understanding and agreeable to CSW plan of care. Patient expressed no further questions or concerns at this time.       Lubertha Sayres, [place] ([phone]) Licensed Clinical Social Worker Orthopedics [phone]) and Surgical [phone])

## 2014-10-14 NOTE — Discharge Summary (Signed)
Patient ID: Tiffany [name] MRN: [id number] DOB/AGE: 09-29-1950 64 y.o.  Admit date: 10/11/2014 Discharge date: 10/14/2014  Admission Diagnoses:  Principal Problem:   Osteoarthritis of right knee Active Problems:   Status post total right knee replacement   Discharge Diagnoses:  Same  Past Medical History  Diagnosis Date  . Diabetes mellitus   . Meningitis   . Hypertension   . GERD (gastroesophageal reflux disease)   . Hiatal hernia   . Gastritis   . IBS (irritable bowel syndrome)   . Depression   . Colon polyps   . Spina bifida occulta   . Insomnia   . Allergy   . Cataract     REMOVED  . Hyperlipidemia   . Osteoporosis   . Ulcer   . Dysrhythmia     RAPID HEART BEAT , CHEST TIGHTNESS  09/29/14     . Pneumonia     BRONCHIECTASIS  . Chronic cough   . Dysphagia   . Shortness of breath dyspnea     WITH EXERTION  . Bronchiectasis   . Macular disorder     MACULAR DISEASE  RT EYE   . Anxiety   . Cancer     SKIN    . Kidney stones     KIDNEY DISEASE   STAGE lll    . Kidney stones   . Arthritis     foot drop  lt foot  / toes   wears brace     . Anginal pain     Surgeries: Procedure(s): RIGHT TOTAL KNEE ARTHROPLASTY on 10/11/2014   Consultants:    Discharged Condition: Improved  Hospital Course: Tiffany [name] is an 64 y.o. female who was admitted 10/11/2014 for operative treatment ofOsteoarthritis of right knee. Patient has severe unremitting pain that affects sleep, daily activities, and work/hobbies. After pre-op clearance the patient was taken to the operating room on 10/11/2014 and underwent  Procedure(s): RIGHT TOTAL KNEE ARTHROPLASTY.    Patient was given perioperative antibiotics: Anti-infectives    Start     Dose/Rate Route Frequency Ordered Stop   10/11/14 1900  ceFAZolin (ANCEF) IVPB 1 g/50 mL premix     1 g 100 mL/hr over 30 Minutes Intravenous Every 6 hours 10/11/14 1856 10/12/14 0415   10/11/14 0600  ceFAZolin (ANCEF) IVPB 2 g/50 mL premix     2 g 100 mL/hr over 30 Minutes Intravenous On call to O.R. 10/10/14 1406 10/11/14 1258       Patient was given sequential compression devices, early ambulation, and chemoprophylaxis to prevent DVT.  Patient benefited maximally from hospital stay and there were no complications.    Recent vital signs: Patient Vitals for the past 24 hrs:  BP Temp Temp src Pulse Resp SpO2  10/14/14 0539 (!) 104/46 mmHg 98.8 F (37.1 C) Oral (!) 101 18 95 %  10/14/14 0000 - - - - - 93 %  10/13/14 2116 (!) 100/36 mmHg - - - - -  10/13/14 2114 (!) 99/36 mmHg - - - - -  10/13/14 2110 (!) 94/38 mmHg 99.5 F (37.5 C) Oral 98 19 92 %  10/13/14 1500 (!) 119/48 mmHg - - 95 18 95 %  10/13/14 1131 (!) 110/55 mmHg - - (!) 112 18 95 %  10/13/14 1100 (!) 103/45 mmHg - - (!) 117 20 (!) 85 %     Recent laboratory studies:  Recent Labs  10/11/14 1039 10/12/14 0552 10/13/14 0715  WBC  --  5.7 7.3  HGB  --  9.6* 8.6*  HCT  --  30.5* 26.4*  PLT  --  132* 141*  NA  --  140  --   K  --  4.7  --   CL  --  104  --   CO2  --  32  --   BUN  --  11  --   CREATININE  --  1.18*  --   GLUCOSE  --  128*  --   INR 0.98  --   --   CALCIUM  --  8.2*  --      Discharge Medications:     Medication List    TAKE these medications        acetaminophen 500 MG tablet  Commonly known as:  TYLENOL  Take 1,000 mg by mouth every 8 (eight) hours as needed for mild pain or moderate pain.     ALIGN 4 MG Caps  Take 1 capsule by mouth daily as needed (IBS).     CALCIUM 1200 PO  Take 1 tablet by mouth daily.     diazepam 10 MG tablet  Commonly known as:  VALIUM  Take 10 mg by mouth at bedtime.     diphenhydrAMINE 25 mg capsule  Commonly known as:  BENADRYL  Take 25 mg by mouth daily as needed for allergies.     DULoxetine 30 MG capsule  Commonly known as:  CYMBALTA  Take 30 mg by mouth daily.     EPINEPHrine 0.15 MG/0.3ML injection  Commonly known as:  EPIPEN JR  Inject 0.15 mg into the muscle as needed.      EPIPEN 2-PAK 0.3 mg/0.3 mL Soaj injection  Generic drug:  EPINEPHrine  as needed.     EYLEA IO  Inject into the eye every 3 (three) months.     furosemide 40 MG tablet  Commonly known as:  LASIX  Take 40 mg by mouth daily as needed for fluid or edema.     loratadine 10 MG tablet  Commonly known as:  CLARITIN  Take 10 mg by mouth daily as needed for allergies.     metFORMIN 500 MG tablet  Commonly known as:  GLUCOPHAGE  Take 500 mg by mouth daily with breakfast.     methocarbamol 500 MG tablet  Commonly known as:  ROBAXIN  Take 1 tablet (500 mg total) by mouth every 6 (six) hours as needed for muscle spasms.     metoprolol succinate 25 MG 24 hr tablet  Commonly known as:  TOPROL-XL  Take 25 mg by mouth daily.     omeprazole 20 MG capsule  Commonly known as:  PRILOSEC  Take 20 mg by mouth daily.     ondansetron 4 MG disintegrating tablet  Commonly known as:  ZOFRAN ODT  Take 1 tablet (4 mg total) by mouth every 8 (eight) hours as needed for nausea or vomiting.     ondansetron 4 MG tablet  Commonly known as:  ZOFRAN  Take 1 tablet (4 mg total) by mouth every 6 (six) hours as needed for nausea.     ONE TOUCH ULTRA TEST test strip  Generic drug:  glucose blood  as needed.     ONETOUCH DELICA LANCETS 57V Misc  as directed.     promethazine 25 MG tablet  Commonly known as:  PHENERGAN  Take 25 mg by mouth every 6 (six) hours as needed for nausea or vomiting.     rivaroxaban 10 MG Tabs tablet  Commonly known  as:  XARELTO  Take 1 tablet (10 mg total) by mouth daily with breakfast.     simvastatin 80 MG tablet  Commonly known as:  ZOCOR  Take 80 mg by mouth every morning.     SYSTANE ULTRA 0.4-0.3 % Soln  Generic drug:  Polyethyl Glycol-Propyl Glycol  Apply 1-2 drops to eye daily.     tobramycin 0.3 % ophthalmic solution  Commonly known as:  TOBREX  Place 1 drop into the right eye every 6 (six) hours. The day before, the day of, and the after procedure.      traMADol 50 MG tablet  Commonly known as:  ULTRAM  Take 2 tablets (100 mg total) by mouth every 6 (six) hours as needed.     Vitamin D 2000 UNITS Caps  Take 2 capsules by mouth daily.        Diagnostic Studies: Dg Knee Right Port  10/11/2014   CLINICAL DATA:  Right knee replacement  EXAM: PORTABLE RIGHT KNEE - 1-2 VIEW  COMPARISON:  None.  FINDINGS: Two views demonstrate expected postoperative appearance of right total knee replacement. Gas/fluid level noted within the suprapatellar space. Soft tissue gas and swelling noted. No fracture line identified. No evidence for hardware failure.  IMPRESSION: Expected postoperative appearance after right total knee arthroplasty.   Electronically Signed   By: Conchita Paris M.D.   On: 10/11/2014 16:57    Disposition: 01-Home or Self Care      Discharge Instructions    Call MD / Call 911    Complete by:  As directed   If you experience chest pain or shortness of breath, CALL 911 and be transported to the hospital emergency room.  If you develope a fever above 101 F, pus (white drainage) or increased drainage or redness at the wound, or calf pain, call your surgeon's office.     Constipation Prevention    Complete by:  As directed   Drink plenty of fluids.  Prune juice may be helpful.  You may use a stool softener, such as Colace (over the counter) 100 mg twice a day.  Use MiraLax (over the counter) for constipation as needed.     Diet - low sodium heart healthy    Complete by:  As directed      Discharge patient    Complete by:  As directed      Increase activity slowly as tolerated    Complete by:  As directed            Follow-up Information    Follow up with [facility].   Why:  They will contact you to schedule home therapy visits.   Contact information:   [address] [place] [address] [phone]       Follow up with Mcarthur Rossetti, MD In 2 weeks.   Specialty:  Orthopedic Surgery   Contact  information:   [place] [address] [phone]        Signed: Mcarthur Rossetti 10/14/2014, 6:53 AM

## 2014-10-14 NOTE — Telephone Encounter (Signed)
Received request from Nurse fax box, documents faxed for surgical clearance. To: [employer] number: [phone] Attention: 2.19.16/km

## 2014-10-15 LAB — TYPE AND SCREEN
ABO/RH(D): O POS
Antibody Screen: NEGATIVE
UNIT DIVISION: 0

## 2014-10-15 LAB — GLUCOSE, CAPILLARY: GLUCOSE-CAPILLARY: 121 mg/dL — AB (ref 70–99)

## 2014-10-15 NOTE — Progress Notes (Signed)
Patient ready for discharge. Discharge instructions reviewed and patient verbalizes understanding. Handout given. Husband will provide transportation.

## 2014-10-15 NOTE — Progress Notes (Signed)
Occupational Therapy Treatment Patient Details Name: Tiffany [name] MRN: [id number] DOB: 1951-06-28 Today's Date: 10/15/2014    History of present illness Pt is a 64 y.o. Female s/p R TKA on 10/11/14. PMH DM, IBS, meningitis, colon polyps, spina bifida occulta, cataract s/p removal, dysrhythmia, skin cancer, osteoporosis, Left foot drop.    OT comments  Pt seen today for ADL session to address functional mobility, activity tolerance, and independence. Pt is showing great progress today and was able to dress self with Min A for RLE and ambulated into hallway with min guard assistance. All education and training completed for fall prevention, energy conservation, and safety with ADLs. No further acute OT needs.    Follow Up Recommendations  No OT follow up;Supervision/Assistance - 24 hour    Equipment Recommendations  Tub/shower bench    Recommendations for Other Services      Precautions / Restrictions Precautions Precautions: Fall;Knee Precaution Comments: Reviewed knee precautions Required Braces or Orthoses: Other Brace/Splint;Knee Immobilizer - Right Knee Immobilizer - Right: On at all times Other Brace/Splint: pt has AFO brace for LLE that she wears at baseline.  Restrictions Weight Bearing Restrictions: Yes RLE Weight Bearing: Weight bearing as tolerated       Mobility Bed Mobility               General bed mobility comments: pt up in chair   Transfers Overall transfer level: Needs assistance Equipment used: Rolling walker (2 wheeled) Transfers: Sit to/from Stand Sit to Stand: Min guard         General transfer comment: VC's to place RLE forward and hand placement. Min guard for balance and safety.         ADL Overall ADL's : Needs assistance/impaired     Grooming: Supervision/safety;Standing           Upper Body Dressing : Set up;Sitting   Lower Body Dressing: Minimal assistance;Sit to/from stand Lower Body Dressing Details (indicate cue type  and reason): Assist for RLE only for socks and shoes.  Toilet Transfer: Min guard;Ambulation;RW;BSC   Toileting- Water quality scientist and Hygiene: Min guard;Sit to/from Nurse, children's Details (indicate cue type and reason): Educated pt on safe tub transfer with use of 3N1 and tub bench (pt is hoping to get tub bench).  Functional mobility during ADLs: Min guard;Rolling walker General ADL Comments: Pt making great progress today and was able to dress herself with Min A for RLE. She ambulated into hallway to address endurance and activity tolerance and was educated on fall prevention and energy conservation.                 Cognition  Arousal/Alertness: Awake/Alert Behavior During Therapy: WFL for tasks assessed/performed Overall Cognitive Status: Within Functional Limits for tasks assessed                                    Pertinent Vitals/ Pain       Pain Assessment: No/denies pain         Frequency Min 2X/week     Progress Toward Goals  OT Goals(current goals can now be found in the care plan section)  Progress towards OT goals: Progressing toward goals  Acute Rehab OT Goals Patient Stated Goal: home today OT Goal Formulation: With patient Time For Goal Achievement: 10/26/14 Potential to Achieve Goals: Good  Plan Discharge plan needs to be updated  End of Session Equipment Utilized During Treatment: Gait belt;Rolling walker;Right knee immobilizer;Other (comment) (LLE brace) CPM Right Knee CPM Right Knee: Off (patient in chair at this time)   Activity Tolerance Patient tolerated treatment well   Patient Left in chair;with call bell/phone within reach   Nurse Communication Other (comment) (tub bench order placed)        Time: 1007-1219 OT Time Calculation (min): 47 min  Charges: OT General Charges $OT Visit: 1 Procedure OT Treatments $Self Care/Home Management : 23-37 mins $Therapeutic Activity: 8-22 mins  Juluis Rainier 10/15/2014, 11:51 AM  Cyndie Chime, OTR/L Occupational Therapist [phone] (pager)

## 2014-10-15 NOTE — Progress Notes (Signed)
   Subjective:  Patient reports pain as mild.  Received 1 unit of RBCs yesterday.  Doing well.  No complaints.  Objective:   VITALS:   Filed Vitals:   10/14/14 1956 10/15/14 0000 10/15/14 0400 10/15/14 0751  BP: 93/55  95/62   Pulse: 95  95   Temp: 99.2 F (37.3 C)  98.8 F (37.1 C)   TempSrc: Oral  Oral   Resp: 18 18 18 18   Height:      Weight:      SpO2: 96% 100% 100%     Neurologically intact Neurovascular intact Sensation intact distally Intact pulses distally Dorsiflexion/Plantar flexion intact Incision: dressing C/D/I and no drainage No cellulitis present Compartment soft   Lab Results  Component Value Date   WBC 6.3 10/14/2014   HGB 7.5* 10/14/2014   HCT 23.4* 10/14/2014   MCV 92.5 10/14/2014   PLT 127* 10/14/2014     Assessment/Plan:  4 Days Post-Op   - Expected postop acute blood loss anemia - will monitor for symptoms - Up with PT/OT - DVT ppx - SCDs, ambulation, xarelto - WBAT left lower extremity - Pain control - Discharge home today  Tiffany [name] 10/15/2014, 8:09 AM [phone]

## 2014-10-15 NOTE — Care Management Note (Signed)
CARE MANAGEMENT NOTE 10/15/2014  Patient:  Tiffany [name], Tiffany [name]   Account Number:  [id number]  Date Initiated:  10/12/2014  Documentation initiated by:  [facility]  Subjective/Objective Assessment:   s/p rt TKA     Action/Plan:   PT/OT evals- recommended HPT and HHOT. PT changed recommendation to SNF 10/14/14   Anticipated DC Date:  10/13/2014   Anticipated DC Plan:  [place]  In-house referral  Clinical Social Worker      DC Planning Services  CM consult      PAC Choice  [place]   Choice offered to / List presented to:  C-1 Patient   DME arranged  3-N-1  Vassie Moselle      DME agency  [place] arranged  [place]   Status of service:  In process, will continue to follow Medicare Important Message given?  YES (If response is "NO", the following Medicare IM given date fields will be blank) Date Medicare IM given:  10/14/2014 Medicare IM given by:  [facility] Date Additional Medicare IM given:   Additional Medicare IM given by:    Discharge Disposition:  [place]  Per UR Regulation:  Reviewed for med. necessity/level of care/duration of stay  If discussed at [place] of Stay Meetings, dates discussed:    Comments:  10/15/2014 11:00 - Frann Rider, RN, BSN  PT is recommending a tub bench. Met with pt to discuss DME. She is going home with the support of her husband. Per Jame from [facility], insurance won't pay for a tub bench and pt made aware. Informed pt that she can purchase the tub bench from [facility].   10/14/14 PT changed recommendation to SNF today. Spoke with patient and her husband, they still would like for her to go home but want to speak with CSW so that SNF can be an option. Explained that I will not cancel the HHPT so that if she decides to go home, [facility] Go will see her the day after d/c. Contacted CSW, she  will speak with patient this pm.  10/12/14 Set up with Arville Go [facility] for HHPT, OT by MD office. Spoke with patient, no change in d/c plan. Contacted Frank with [facility] and requested rolling walker and 3N1 be delivered to patient's room.

## 2014-10-15 NOTE — Progress Notes (Signed)
Physical Therapy Treatment Patient Details Name: Tiffany [name] MRN: [id number] DOB: [birth year] Today's Date: 10/15/2014    History of Present Illness Pt is a 64 y.o. Female s/p R TKA on 10/11/14. PMH DM, IBS, meningitis, colon polyps, spina bifida occulta, cataract s/p removal, dysrhythmia, skin cancer, osteoporosis, Left foot drop.     PT Comments    Pt much improved from yesterday and progressing well. Pt able to tolerate amb and stair negotiation with min guard. Pt now safe to d/c home with assist of spouse, recommended DME and HHPT once cleared medically.   Follow Up Recommendations  Home health PT;Supervision/Assistance - 24 hour     Equipment Recommendations  Rolling walker with 5" wheels    Recommendations for Other Services       Precautions / Restrictions Precautions Precautions: Knee Precaution Comments: Reviewed knee precautions Required Braces or Orthoses: Other Brace/Splint;Knee Immobilizer - Right Knee Immobilizer - Right: On at all times Other Brace/Splint: pt has AFO brace for LLE that she wears at baseline.  Restrictions Weight Bearing Restrictions: Yes RLE Weight Bearing: Weight bearing as tolerated    Mobility  Bed Mobility               General bed mobility comments: pt up in chair   Transfers Overall transfer level: Needs assistance Equipment used: Rolling walker (2 wheeled) Transfers: Sit to/from Stand Sit to Stand: Supervision         General transfer comment: pt with good technique  Ambulation/Gait Ambulation/Gait assistance: Min guard Ambulation Distance (Feet): 300 Feet Assistive device: Rolling walker (2 wheeled) Gait Pattern/deviations: Step-through pattern Gait velocity: slow   General Gait Details: minA from PT to maintain forward momentum of RW to achieve more fluid gait pattern and increase R knee extension. no R knee buckling without KI this date. L LE with AFO   Stairs Stairs: Yes Stairs assistance: Min  assist Stair Management: Two rails;Backwards;With walker Number of Stairs: 2 General stair comments: went over x 3 until pt able to verbally direct spouse how to assist pt. utilized "up with the good, down with the bad"  Wheelchair Mobility    Modified Rankin (Stroke Patients Only)       Balance                                    Cognition Arousal/Alertness: Awake/alert Behavior During Therapy: WFL for tasks assessed/performed Overall Cognitive Status: Within Functional Limits for tasks assessed                      Exercises Total Joint Exercises Goniometric ROM: 5-47    General Comments        Pertinent Vitals/Pain Pain Assessment: 0-10 Pain Score: 5  Pain Location: R knee Pain Descriptors / Indicators: Aching Pain Intervention(s): Monitored during session    Home Living                      Prior Function            PT Goals (current goals can now be found in the care plan section) Acute Rehab PT Goals Patient Stated Goal: home today Progress towards PT goals: Progressing toward goals    Frequency  7X/week    PT Plan Discharge plan needs to be updated    Co-evaluation             End of  Session Equipment Utilized During Treatment: Gait belt Activity Tolerance: Patient tolerated treatment well Patient left: in chair;with call bell/phone within reach     Time: 1127-1151 PT Time Calculation (min) (ACUTE ONLY): 24 min  Charges:  $Gait Training: 23-37 mins                    G Codes:      Kingsley Callander 10/15/2014, 1:31 PM   Kittie Plater, PT, DPT Pager #: [phone] Office #: [phone]

## 2014-11-07 ENCOUNTER — Ambulatory Visit: Payer: Medicare Other | Attending: Orthopaedic Surgery | Admitting: Physical Therapy

## 2014-11-07 ENCOUNTER — Encounter: Payer: Self-pay | Admitting: Physical Therapy

## 2014-11-07 DIAGNOSIS — K219 Gastro-esophageal reflux disease without esophagitis: Secondary | ICD-10-CM | POA: Insufficient documentation

## 2014-11-07 DIAGNOSIS — Q760 Spina bifida occulta: Secondary | ICD-10-CM | POA: Diagnosis not present

## 2014-11-07 DIAGNOSIS — I10 Essential (primary) hypertension: Secondary | ICD-10-CM | POA: Insufficient documentation

## 2014-11-07 DIAGNOSIS — Z471 Aftercare following joint replacement surgery: Secondary | ICD-10-CM | POA: Diagnosis present

## 2014-11-07 DIAGNOSIS — Z96651 Presence of right artificial knee joint: Secondary | ICD-10-CM | POA: Diagnosis not present

## 2014-11-07 DIAGNOSIS — M810 Age-related osteoporosis without current pathological fracture: Secondary | ICD-10-CM | POA: Insufficient documentation

## 2014-11-07 DIAGNOSIS — E119 Type 2 diabetes mellitus without complications: Secondary | ICD-10-CM | POA: Insufficient documentation

## 2014-11-07 DIAGNOSIS — M25661 Stiffness of right knee, not elsewhere classified: Secondary | ICD-10-CM

## 2014-11-07 DIAGNOSIS — M6281 Muscle weakness (generalized): Secondary | ICD-10-CM

## 2014-11-07 NOTE — Patient Instructions (Signed)
Physical therapist instructed patient on not sleeping with pillow under her right knee, icing right knee in the middle of the night, and sleep with pillows between her knees.  Patient verbally understands.

## 2014-11-07 NOTE — Therapy (Signed)
[facility] [address], [place], [place], [address] Phone: [phone]   Fax:  [phone]  Physical Therapy Treatment  Patient Details  Name: Tiffany [name] MRN: [id number] Date of Birth: Feb 11, 1951 Referring Provider:  Mcarthur Rossetti*  Encounter Date: 11/07/2014      PT End of Session - 11/07/14 0931    Visit Number 1   Date for PT Re-Evaluation 01/02/15   PT Start Time 0930   PT Stop Time 1030   PT Time Calculation (min) 60 min   Activity Tolerance Patient tolerated treatment well   Behavior During Therapy [facility] for tasks assessed/performed      Past Medical History  Diagnosis Date  . Diabetes mellitus   . Meningitis   . Hypertension   . GERD (gastroesophageal reflux disease)   . Hiatal hernia   . Gastritis   . IBS (irritable bowel syndrome)   . Depression   . Colon polyps   . Spina bifida occulta   . Insomnia   . Allergy   . Cataract     REMOVED  . Hyperlipidemia   . Osteoporosis   . Ulcer   . Dysrhythmia     RAPID HEART BEAT , CHEST TIGHTNESS  09/29/14     . Pneumonia     BRONCHIECTASIS  . Chronic cough   . Dysphagia   . Shortness of breath dyspnea     WITH EXERTION  . Bronchiectasis   . Macular disorder     MACULAR DISEASE  RT EYE   . Anxiety   . Cancer     SKIN    . Kidney stones     KIDNEY DISEASE   STAGE lll    . Kidney stones   . Arthritis     foot drop  lt foot  / toes   wears brace     . Anginal pain     Past Surgical History  Procedure Laterality Date  . Back surgery    . Abdominal hysterectomy    . Tonsillectomy    . Polypectomy    . Meniscus repair    . Fundoplication    . Lithotripsy    . Elbow surgery      left  . Cataract extraction  03/2012    bilateral  . Colonoscopy    . Knee arthroscopy      rt knee   . Eardrum      left   ruptured and repaired  . Total knee arthroplasty Right 10/11/2014    Procedure: RIGHT TOTAL KNEE ARTHROPLASTY;  Surgeon:  Mcarthur Rossetti, MD;  Location: [place];  Service: Orthopedics;  Laterality: Right;    There were no vitals filed for this visit.  Visit Diagnosis:  Decreased ROM of right knee - Plan: PT plan of care cert/re-cert  Muscle weakness of lower extremity - Plan: PT plan of care cert/re-cert      Subjective Assessment - 11/07/14 0946    Symptoms Patient has had a rt. TKR on 10/11/2014. Discharged on 10/15/2014 to home health for 3 weeks. Patient is walking with a walker.    Pertinent History Patient has spina bifida and osteoporosis   Limitations Walking;Standing;House hold activities   How long can you sit comfortably? No difficulty   How long can you stand comfortably? Has to use walker   How long can you walk comfortably? Walks with a walker or cane for 30 minutes.    Patient Stated Goals ambulate  with a single point cane, return to prior functional level, return to water walking, increase strength of right leg   Currently in Pain? Yes   Pain Score 5    Pain Location Knee   Pain Orientation Right   Pain Descriptors / Indicators Crushing  bee stinging   Pain Type Surgical pain   Pain Onset More than a month ago   Pain Frequency Constant   Aggravating Factors  sleeping, stairs   Pain Relieving Factors rest, ice, walking   Effect of Pain on Daily Activities makes daily activities   Multiple Pain Sites No            [facility] PT Assessment - 11/07/14 0001    Assessment   Medical Diagnosis s/p right TKR   Onset Date 10/11/14   Next MD Visit 10/17/2014   Prior Therapy 3 weeks of home health PT   Precautions   Precautions --  No joint mobilization   Balance Screen   Has the patient fallen in the past 6 months Yes  Patient has to wear a brace on lt. ankle due to spina bifida   How many times? 2   Has the patient had a decrease in activity level because of a fear of falling?  Yes  sometimes   Is the patient reluctant to leave their home because of a fear of falling?  No    Prior Function   Level of Independence Independent with basic ADLs   Observation/Other Assessments   Observations --  edema in right knee   Focus on Therapeutic Outcomes (FOTO)  62% limitation   AROM   Right/Left Knee --  supine ext -10   Right Knee Extension -12  in sitting   Right Knee Flexion 100   PROM   PROM Assessment Site Knee   Right/Left Knee Right  ext -8, flex 110   Strength   Overall Strength --  right hip flexion 3+/5, gastroc 3+/5   Right Knee Flexion 3+/5   Right Knee Extension 3+/5   Flexibility   Soft Tissue Assessment /Muscle Length --  tight right gastroc    Hamstrings tight   Quadriceps tight   Palpation   Palpation Decreased mobility of right patella and scar   Ambulation/Gait   Ambulation/Gait Yes   Ambulation/Gait Assistance 6: Modified independent (Device/Increase time)  prior to surgery patient would use a cane   Assistive device Rolling walker   Gait Pattern Decreased step length - left;Decreased stride length;Decreased hip/knee flexion - right;Decreased dorsiflexion - right;Decreased weight shift to right;Right flexed knee in stance   Ambulation Surface Level   Stairs Yes   Stairs Assistance 6: Modified independent (Device/Increase time)   Stair Management Technique Two rails;Step to pattern  prior to surgery used a cane and step to step pattern   Gait Comments Patient will use a w/c for long distances prior to surgery                   [facility] Adult PT Treatment/Exercise - 11/07/14 0001    Modalities   Modalities Cryotherapy   Cryotherapy   Cryotherapy Location Knee  right elevated   Type of Cryotherapy Other (comment)  vasopnuematic x 15 min                PT Education - 11/07/14 1013    Education provided Yes   Education Details sleep posture, icing   Person(s) Educated Patient   Methods Explanation;Verbal cues   Comprehension Verbalized understanding;Returned  demonstration          PT Short Term Goals -  12/03/14 1016    PT SHORT TERM GOAL #1   Title independent with ROM exercises   Time 4   Period Weeks   Status New   PT SHORT TERM GOAL #2   Title sleep improved >/= 25%   Time 4   Period Weeks   Status New   PT SHORT TERM GOAL #3   Title right knee strength >/= 4/5   Time 4   Period Weeks   Status New   PT SHORT TERM GOAL #4   Title right knee extension PROM</= -5 degrees   Time 4   Period Weeks           PT Long Term Goals - Dec 03, 2014 0935    PT LONG TERM GOAL #1   Title Independent with HEP for right knee   Time 8   Period Weeks   Status New   PT LONG TERM GOAL #2   Title able to ambulate with an assistive device due to right knee strength >/= 4+/5 without difficulty   Time 8   Period Weeks   Status New   PT LONG TERM GOAL #3   Title flex right knee AROM >/= 115 so she can go up and down stairs with a a step over step pattern and 1 railing   Time 8   Period Weeks   Status New   PT LONG TERM GOAL #4   Title pain in right knee decreased >/= 75% so she can resume her household tasks   Time 8   Period Weeks   Status New   PT LONG TERM GOAL #5   Title sleep with pain decreased >/= 75%   Time 8   Period Weeks   Status New               Plan - 03-Dec-2014 1013    Clinical Impression Statement Patient has decreased rom and strength of right knee, edema of right knee, decreased right hip and ankle strength, decreased patella mobility, gait deficits   Pt will benefit from skilled therapeutic intervention in order to improve on the following deficits Abnormal gait;Decreased range of motion;Difficulty walking;Impaired flexibility;Increased edema;Decreased endurance;Decreased activity tolerance;Increased fascial restricitons;Pain;Increased muscle spasms;Decreased balance;Decreased mobility;Decreased strength   Rehab Potential Good   Clinical Impairments Affecting Rehab Potential None   PT Frequency 2x / week   PT Duration 8 weeks   PT Treatment/Interventions  Moist Heat;Therapeutic activities;Patient/family education;Scar mobilization;Passive range of motion;Therapeutic exercise;Ultrasound;Gait training;Balance training;Manual techniques;Neuromuscular re-education;Stair training;Cryotherapy;Electrical Stimulation;Functional mobility training   PT Next Visit Plan recumbent bike, mini trampoline, ice, soft tissue work, right patella mobilization, ROM of right knee   PT Home Exercise Plan knee ROM exercises   Recommended Other Services None   Consulted and Agree with Plan of Care Patient          G-Codes - 2014/12/03 1017    Functional Assessment Tool Used FOTO 62% limitation   Functional Limitation Mobility: Walking and moving around   Mobility: Walking and Moving Around Current Status [phone]) At least 40 percent but less than 60 percent impaired, limited or restricted   Mobility: Walking and Moving Around Goal Status [phone]) At least 40 percent but less than 60 percent impaired, limited or restricted      Problem List Patient Active Problem List   Diagnosis Date Noted  . Osteoarthritis of right knee 10/11/2014  . Status post total right  knee replacement 10/11/2014  . Primary snoring 07/05/2014  . Bronchiectasis with acute exacerbation 12/29/2013  . Bronchiectasis 11/10/2013  . DM type 2 (diabetes mellitus, type 2) 11/10/2013  . PERSONAL HX COLONIC POLYPS 03/07/2008  . ADENOMATOUS COLONIC POLYP 03/04/2008  . ESOPHAGEAL STRICTURE 03/04/2008  . GERD 03/04/2008  . IBS 03/04/2008  . Dysphagia, pharyngoesophageal phase 02/05/2008    GRAY,CHERYL, PT 11/07/2014, 10:21 AM  [facility] [address], [place], [place], [address] Phone: [phone]   Fax:  [phone]

## 2014-11-09 ENCOUNTER — Encounter: Payer: Self-pay | Admitting: Physical Therapy

## 2014-11-09 ENCOUNTER — Ambulatory Visit: Payer: Medicare Other | Admitting: Physical Therapy

## 2014-11-09 DIAGNOSIS — Z471 Aftercare following joint replacement surgery: Secondary | ICD-10-CM | POA: Diagnosis not present

## 2014-11-09 DIAGNOSIS — M6281 Muscle weakness (generalized): Secondary | ICD-10-CM

## 2014-11-09 DIAGNOSIS — M25661 Stiffness of right knee, not elsewhere classified: Secondary | ICD-10-CM

## 2014-11-09 NOTE — Patient Instructions (Addendum)
      3x day 2x10 holding 5-8 seconds  3x  Raise leg until knee is straight. ___ reps per set, ___ sets per day, ___ days per week      Knee Extension: Short Arc (Eccentric) - Supine or Sitting   Lie on back with roll under knee. Extend knee. Slowly lower foot for 3-5 seconds. _10__ reps per set, _5__ sets per day, _7__ days per week.   Copyright  VHI. All rights reserved.  [employer] on back. Lift leg with knee straight. Slowly lower leg for 3-5 seconds. ___10 reps per set, 20__ sets per day, _7__ days per week. Lower like elevator, stopping at each floor. Heel Slides   Use a strap or towel to Slide left heel along bed towards bottom. Hold for _5__ seconds. Perform gently. Slide back to flat knee position. Repeat 10___ times. Do _3__ times a day.     Copyright  VHI. All rights reserved.    Copyright  VHI. All rights reserved.  __ sessions per day.  http://gt2.exer.us/275   Copyright  VHI. All rights reserved.

## 2014-11-09 NOTE — Therapy (Signed)
[facility] [address], [place], [place], [address] Phone: [phone]   Fax:  [phone]  Physical Therapy Treatment  Patient Details  Name: Tiffany [name] MRN: [id number] Date of Birth: 1951/05/29 Referring Provider:  Prince Solian, MD  Encounter Date: 11/09/2014      PT End of Session - 11/09/14 1312    Visit Number 2   Date for PT Re-Evaluation 01/02/15   PT Start Time 1230   PT Stop Time 1325   PT Time Calculation (min) 55 min   Activity Tolerance Patient tolerated treatment well      Past Medical History  Diagnosis Date  . Diabetes mellitus   . Meningitis   . Hypertension   . GERD (gastroesophageal reflux disease)   . Hiatal hernia   . Gastritis   . IBS (irritable bowel syndrome)   . Depression   . Colon polyps   . Spina bifida occulta   . Insomnia   . Allergy   . Cataract     REMOVED  . Hyperlipidemia   . Osteoporosis   . Ulcer   . Dysrhythmia     RAPID HEART BEAT , CHEST TIGHTNESS  09/29/14     . Pneumonia     BRONCHIECTASIS  . Chronic cough   . Dysphagia   . Shortness of breath dyspnea     WITH EXERTION  . Bronchiectasis   . Macular disorder     MACULAR DISEASE  RT EYE   . Anxiety   . Cancer     SKIN    . Kidney stones     KIDNEY DISEASE   STAGE lll    . Kidney stones   . Arthritis     foot drop  lt foot  / toes   wears brace     . Anginal pain     Past Surgical History  Procedure Laterality Date  . Back surgery    . Abdominal hysterectomy    . Tonsillectomy    . Polypectomy    . Meniscus repair    . Fundoplication    . Lithotripsy    . Elbow surgery      left  . Cataract extraction  03/2012    bilateral  . Colonoscopy    . Knee arthroscopy      rt knee   . Eardrum      left   ruptured and repaired  . Total knee arthroplasty Right 10/11/2014    Procedure: RIGHT TOTAL KNEE ARTHROPLASTY;  Surgeon: Mcarthur Rossetti, MD;  Location: [place];  Service:  Orthopedics;  Laterality: Right;    There were no vitals filed for this visit.  Visit Diagnosis:  Decreased ROM of right knee  Muscle weakness of lower extremity      Subjective Assessment - 11/09/14 1237    Symptoms Difficulty sleeping at night. Ambulates slowly today.Using cane.   Currently in Pain? Yes   Pain Score 4    Pain Location Knee   Pain Orientation Right   Pain Descriptors / Indicators Sore   Pain Type Surgical pain   Pain Onset More than a month ago   Aggravating Factors  At night, stairs   Pain Relieving Factors Rest, ice, walking   Multiple Pain Sites No                       [facility] Adult PT Treatment/Exercise - 11/09/14 0001    Knee/Hip Exercises: Aerobic  Stationary Bike ROM only; rocking x6 min   Knee/Hip Exercises: Standing   Rocker Board --  Standing x2 min   Rebounder Weight shifting 1 min each direction   Knee/Hip Exercises: Seated   Long Arc Quad Strengthening;Right;2 sets;10 reps   Knee/Hip Exercises: Supine   Short Arc Quad Sets Strengthening;Right;2 sets;10 reps   Heel Slides AROM;Right;10 reps   Cryotherapy   Number Minutes Cryotherapy 15 Minutes   Cryotherapy Location Knee  right elevated   Type of Cryotherapy --  Game ready 3 flakes medium                PT Education - 11/09/14 1253    Education provided Yes   Education Details LAQ, quad sets, knee extension stretching added to HEP   Person(s) Educated Patient   Methods Explanation;Demonstration;Handout   Comprehension Verbalized understanding;Returned demonstration          PT Short Term Goals - 11/07/14 1016    PT SHORT TERM GOAL #1   Title independent with ROM exercises   Time 4   Period Weeks   Status New   PT SHORT TERM GOAL #2   Title sleep improved >/= 25%   Time 4   Period Weeks   Status New   PT SHORT TERM GOAL #3   Title right knee strength >/= 4/5   Time 4   Period Weeks   Status New   PT SHORT TERM GOAL #4   Title right knee  extension PROM</= -5 degrees   Time 4   Period Weeks           PT Long Term Goals - 11/07/14 0935    PT LONG TERM GOAL #1   Title Independent with HEP for right knee   Time 8   Period Weeks   Status New   PT LONG TERM GOAL #2   Title able to ambulate with an assistive device due to right knee strength >/= 4+/5 without difficulty   Time 8   Period Weeks   Status New   PT LONG TERM GOAL #3   Title flex right knee AROM >/= 115 so she can go up and down stairs with a a step over step pattern and 1 railing   Time 8   Period Weeks   Status New   PT LONG TERM GOAL #4   Title pain in right knee decreased >/= 75% so she can resume her household tasks   Time 8   Period Weeks   Status New   PT LONG TERM GOAL #5   Title sleep with pain decreased >/= 75%   Time 8   Period Weeks   Status New               Plan - 11/09/14 1312    Clinical Impression Statement Patient increased flexion in knee, remains with decreased extension, added to HEP   Pt will benefit from skilled therapeutic intervention in order to improve on the following deficits Abnormal gait;Decreased range of motion;Difficulty walking;Impaired flexibility;Increased edema;Decreased endurance;Decreased activity tolerance;Increased fascial restricitons;Pain;Increased muscle spasms;Decreased balance;Decreased mobility;Decreased strength   Rehab Potential Good   Clinical Impairments Affecting Rehab Potential None   PT Frequency 2x / week   PT Duration 8 weeks   PT Treatment/Interventions Moist Heat;Therapeutic activities;Patient/family education;Scar mobilization;Passive range of motion;Therapeutic exercise;Ultrasound;Gait training;Balance training;Manual techniques;Neuromuscular re-education;Stair training;Cryotherapy;Electrical Stimulation;Functional mobility training   PT Next Visit Plan Work on knee extension   Consulted and Agree with Plan of Care Patient  Problem List Patient Active Problem List    Diagnosis Date Noted  . Osteoarthritis of right knee 10/11/2014  . Status post total right knee replacement 10/11/2014  . Primary snoring 07/05/2014  . Bronchiectasis with acute exacerbation 12/29/2013  . Bronchiectasis 11/10/2013  . DM type 2 (diabetes mellitus, type 2) 11/10/2013  . PERSONAL HX COLONIC POLYPS 03/07/2008  . ADENOMATOUS COLONIC POLYP 03/04/2008  . ESOPHAGEAL STRICTURE 03/04/2008  . GERD 03/04/2008  . IBS 03/04/2008  . Dysphagia, pharyngoesophageal phase 02/05/2008    Jeriel Vivanco, PTA 11/09/2014, 1:14 PM  [facility] [address], [place], [place], [address] Phone: [phone]   Fax:  [phone]     Patient measured AROM 12-105 degrees at end of session.

## 2014-11-16 ENCOUNTER — Encounter: Payer: Self-pay | Admitting: Physical Therapy

## 2014-11-16 ENCOUNTER — Ambulatory Visit: Payer: Medicare Other | Admitting: Physical Therapy

## 2014-11-16 DIAGNOSIS — Z471 Aftercare following joint replacement surgery: Secondary | ICD-10-CM | POA: Diagnosis not present

## 2014-11-16 DIAGNOSIS — M6281 Muscle weakness (generalized): Secondary | ICD-10-CM

## 2014-11-16 DIAGNOSIS — R609 Edema, unspecified: Secondary | ICD-10-CM

## 2014-11-16 DIAGNOSIS — M25661 Stiffness of right knee, not elsewhere classified: Secondary | ICD-10-CM

## 2014-11-16 NOTE — Therapy (Signed)
[facility] [address] [place], [place], [address] Phone: [phone]   Fax:  [phone]  Physical Therapy Treatment  Patient Details  Name: Tiffany [name] MRN: [id number] Date of Birth: 1950-11-24 Referring Provider:  Mcarthur Rossetti*  Encounter Date: 11/16/2014      PT End of Session - 11/16/14 1308    Visit Number 3   Date for PT Re-Evaluation 01/02/15   PT Start Time 6767   PT Stop Time 1320   PT Time Calculation (min) 50 min   Activity Tolerance Patient tolerated treatment well   Behavior During Therapy [facility] for tasks assessed/performed      Past Medical History  Diagnosis Date  . Diabetes mellitus   . Meningitis   . Hypertension   . GERD (gastroesophageal reflux disease)   . Hiatal hernia   . Gastritis   . IBS (irritable bowel syndrome)   . Depression   . Colon polyps   . Spina bifida occulta   . Insomnia   . Allergy   . Cataract     REMOVED  . Hyperlipidemia   . Osteoporosis   . Ulcer   . Dysrhythmia     RAPID HEART BEAT , CHEST TIGHTNESS  09/29/14     . Pneumonia     BRONCHIECTASIS  . Chronic cough   . Dysphagia   . Shortness of breath dyspnea     WITH EXERTION  . Bronchiectasis   . Macular disorder     MACULAR DISEASE  RT EYE   . Anxiety   . Cancer     SKIN    . Kidney stones     KIDNEY DISEASE   STAGE lll    . Kidney stones   . Arthritis     foot drop  lt foot  / toes   wears brace     . Anginal pain     Past Surgical History  Procedure Laterality Date  . Back surgery    . Abdominal hysterectomy    . Tonsillectomy    . Polypectomy    . Meniscus repair    . Fundoplication    . Lithotripsy    . Elbow surgery      left  . Cataract extraction  03/2012    bilateral  . Colonoscopy    . Knee arthroscopy      rt knee   . Eardrum      left   ruptured and repaired  . Total knee arthroplasty Right 10/11/2014    Procedure: RIGHT TOTAL KNEE ARTHROPLASTY;  Surgeon:  Mcarthur Rossetti, MD;  Location: [place];  Service: Orthopedics;  Laterality: Right;    There were no vitals filed for this visit.  Visit Diagnosis:  Decreased ROM of right knee  Muscle weakness of lower extremity  Edema      Subjective Assessment - 11/16/14 1227    Symptoms Has UTI, is on antibiotic for 7 days. Knee feeling good.    Currently in Pain? Yes   Pain Score 6    Pain Location Knee   Pain Orientation Right   Pain Descriptors / Indicators Sore   Pain Type Surgical pain   Aggravating Factors  Stairs, still cannot sleep   Pain Relieving Factors rest, ice, walking   Multiple Pain Sites No            [facility] PT Assessment - 11/16/14 0001    AROM   Right Knee Extension 15  Right Knee Flexion 120                   [facility] Adult PT Treatment/Exercise - 11/16/14 0001    Knee/Hip Exercises: Aerobic   Stationary Bike L 0 full, slow ROM only x 10 min   Knee/Hip Exercises: Standing   Forward Step Up Right;1 set;5 reps;Hand Hold: 1;Step Height: 4"   Rebounder Weight shifting 1 min each direction   Knee/Hip Exercises: Seated   Long Arc Quad Strengthening;Right;2 sets;10 reps   Long Arc Quad Weight 1 lbs.   Knee/Hip Exercises: Supine   Short Arc Quad Sets Strengthening;Right;2 sets;10 reps   Short Arc Quad Sets Limitations Added 1#   Cryotherapy   Number Minutes Cryotherapy 15 Minutes   Cryotherapy Location Knee   Type of Cryotherapy --  Gameready 3 flakes medium                PT Education - 11/16/14 1306    Education provided Yes   Education Details HEP; hamstring/calf stretching with strap   Person(s) Educated Patient   Methods Explanation;Demonstration;Handout   Comprehension Verbalized understanding;Returned demonstration          PT Short Term Goals - 11/16/14 1231    PT SHORT TERM GOAL #1   Title independent with ROM exercises   Time 4   Period Weeks   PT SHORT TERM GOAL #2   Title sleep improved >/= 25%   Time 4    Period Weeks   Status On-going  Still painful at night.   PT SHORT TERM GOAL #3   Title right knee strength >/= 4/5   Time 4   Period Weeks   Status New   PT SHORT TERM GOAL #4   Title right knee extension PROM</= -5 degrees   Time 4   Period Weeks   Status On-going  15  degrees of flexion           PT Long Term Goals - 11/16/14 1304    PT LONG TERM GOAL #1   Time 8   Period Weeks   Status On-going               Plan - 11/16/14 1309    Clinical Impression Statement ROM improving, needs to continue woring on knee extension. Ambulating with steadier gait.   Pt will benefit from skilled therapeutic intervention in order to improve on the following deficits Abnormal gait;Decreased range of motion;Difficulty walking;Impaired flexibility;Increased edema;Decreased endurance;Decreased activity tolerance;Increased fascial restricitons;Pain;Increased muscle spasms;Decreased balance;Decreased mobility;Decreased strength   Rehab Potential Good   Clinical Impairments Affecting Rehab Potential None   PT Frequency 2x / week   PT Duration 8 weeks   PT Treatment/Interventions Moist Heat;Therapeutic activities;Patient/family education;Scar mobilization;Passive range of motion;Therapeutic exercise;Ultrasound;Gait training;Balance training;Manual techniques;Neuromuscular re-education;Stair training;Cryotherapy;Electrical Stimulation;Functional mobility training   PT Next Visit Plan Work on knee extension and continue with steps and knee strength   Consulted and Agree with Plan of Care Patient        Problem List Patient Active Problem List   Diagnosis Date Noted  . Osteoarthritis of right knee 10/11/2014  . Status post total right knee replacement 10/11/2014  . Primary snoring 07/05/2014  . Bronchiectasis with acute exacerbation 12/29/2013  . Bronchiectasis 11/10/2013  . DM type 2 (diabetes mellitus, type 2) 11/10/2013  . PERSONAL HX COLONIC POLYPS 03/07/2008  . ADENOMATOUS  COLONIC POLYP 03/04/2008  . ESOPHAGEAL STRICTURE 03/04/2008  . GERD 03/04/2008  . IBS 03/04/2008  . Dysphagia, pharyngoesophageal  phase 02/05/2008    Shanetha Bradham, PTA 11/16/2014, 1:11 PM  [facility] [address], [place], [place], [address] Phone: [phone]   Fax:  [phone]

## 2014-11-16 NOTE — Patient Instructions (Signed)
Leg Extension (Hamstring)   Sit toward front edge of chair, with leg out straight, heel on floor, toes pointing toward body. Keeping back straight, bend forward at hip, breathing out through pursed lips. Return, breathing in. Repeat ___ times. Repeat with other leg. Do ___ sessions per day. Variation: Perform from standing position, with support.  Hamstring Step 1   Straighten left knee. Keep knee level with other knee or on bolster. Hold ___ seconds. Relax knee by returning foot to start. Repeat ___ times.  Hamstring Stretch   With other leg bent, foot flat, grasp right leg and slowly try to straighten knee. Hold ____ seconds. Repeat ____ times. Do ____ sessions per day.  http://gt2.exer.us/279   Hamstring Stretch (Standing)   Standing, place one heel on chair or bench. Use one or both hands on thigh for support. Keeping torso straight, lean forward slowly until a stretch is felt in back of same thigh. Hold ____ seconds. Repeat with other leg.  Hamstring Step 2   Left foot relaxed, knee straight, other leg bent, foot flat. Raise straight leg further upward to maximal range. Hold ___ seconds. Relax leg completely down. Repeat ___ times.  Hamstring Step 3   Left leg in maximal straight leg raise, heel at maximal stretch, straighten knee further by tightening knee cap. Warning: Intense stretch. Stay within tolerance. Hold _15__ seconds. Relax knee cap only. Repeat __3_ times.  Hamstring Step 4   Left leg and foot in maximal stretch. Slowly lengthen and press other leg down as close to floor as possible. Keep lower abdominals tight. Warning: Intense stretch. Stay within tolerance. Hold ___ seconds. Relax lengthened leg slightly. Do not re-bend knee. Repeat press and lengthen ___ times.  Hamstring Stretch, Reclined (Strap, Doorframe)   Lengthen bottom leg on floor. Extend top leg along edge of doorframe or press foot up into yoga strap. Hold for ____ breaths. Repeat ____  times each leg.  Stretching: Hamstring (Sitting)   With right leg straight, tuck other foot near groin. Reach down until stretch is felt in back of thigh. Keep back straight. Hold ____ seconds. Repeat ____ times per set. Do ____ sets per session. Do ____ sessions per day.  http://orth.exer.us/661   Copyright  VHI. All rights reserved.

## 2014-11-28 ENCOUNTER — Ambulatory Visit: Payer: Medicare Other | Attending: Orthopaedic Surgery | Admitting: Physical Therapy

## 2014-11-28 ENCOUNTER — Encounter: Payer: Self-pay | Admitting: Physical Therapy

## 2014-11-28 DIAGNOSIS — I10 Essential (primary) hypertension: Secondary | ICD-10-CM | POA: Diagnosis not present

## 2014-11-28 DIAGNOSIS — M810 Age-related osteoporosis without current pathological fracture: Secondary | ICD-10-CM | POA: Insufficient documentation

## 2014-11-28 DIAGNOSIS — E119 Type 2 diabetes mellitus without complications: Secondary | ICD-10-CM | POA: Diagnosis not present

## 2014-11-28 DIAGNOSIS — Z96651 Presence of right artificial knee joint: Secondary | ICD-10-CM | POA: Insufficient documentation

## 2014-11-28 DIAGNOSIS — K219 Gastro-esophageal reflux disease without esophagitis: Secondary | ICD-10-CM | POA: Diagnosis not present

## 2014-11-28 DIAGNOSIS — R609 Edema, unspecified: Secondary | ICD-10-CM

## 2014-11-28 DIAGNOSIS — M25661 Stiffness of right knee, not elsewhere classified: Secondary | ICD-10-CM

## 2014-11-28 DIAGNOSIS — M6281 Muscle weakness (generalized): Secondary | ICD-10-CM

## 2014-11-28 DIAGNOSIS — Z471 Aftercare following joint replacement surgery: Secondary | ICD-10-CM | POA: Diagnosis present

## 2014-11-28 DIAGNOSIS — Q760 Spina bifida occulta: Secondary | ICD-10-CM | POA: Insufficient documentation

## 2014-11-28 NOTE — Therapy (Signed)
[facility] [address], [place], [place], [address] Phone: [phone]   Fax:  [phone]  Physical Therapy Treatment  Patient Details  Name: Tiffany [name] MRN: [id number] Date of Birth: Aug 26, 1951 Referring Provider:  Mcarthur Rossetti*  Encounter Date: 11/28/2014      PT End of Session - 11/28/14 1511    Visit Number 4   Date for PT Re-Evaluation 01/02/15   PT Start Time 9485   PT Stop Time 1535   PT Time Calculation (min) 50 min   Activity Tolerance Patient tolerated treatment well   Behavior During Therapy [facility] for tasks assessed/performed      Past Medical History  Diagnosis Date  . Diabetes mellitus   . Meningitis   . Hypertension   . GERD (gastroesophageal reflux disease)   . Hiatal hernia   . Gastritis   . IBS (irritable bowel syndrome)   . Depression   . Colon polyps   . Spina bifida occulta   . Insomnia   . Allergy   . Cataract     REMOVED  . Hyperlipidemia   . Osteoporosis   . Ulcer   . Dysrhythmia     RAPID HEART BEAT , CHEST TIGHTNESS  09/29/14     . Pneumonia     BRONCHIECTASIS  . Chronic cough   . Dysphagia   . Shortness of breath dyspnea     WITH EXERTION  . Bronchiectasis   . Macular disorder     MACULAR DISEASE  RT EYE   . Anxiety   . Cancer     SKIN    . Kidney stones     KIDNEY DISEASE   STAGE lll    . Kidney stones   . Arthritis     foot drop  lt foot  / toes   wears brace     . Anginal pain     Past Surgical History  Procedure Laterality Date  . Back surgery    . Abdominal hysterectomy    . Tonsillectomy    . Polypectomy    . Meniscus repair    . Fundoplication    . Lithotripsy    . Elbow surgery      left  . Cataract extraction  03/2012    bilateral  . Colonoscopy    . Knee arthroscopy      rt knee   . Eardrum      left   ruptured and repaired  . Total knee arthroplasty Right 10/11/2014    Procedure: RIGHT TOTAL KNEE ARTHROPLASTY;  Surgeon:  Mcarthur Rossetti, MD;  Location: [place];  Service: Orthopedics;  Laterality: Right;    There were no vitals filed for this visit.  Visit Diagnosis:  Decreased ROM of right knee  Muscle weakness of lower extremity  Edema      Subjective Assessment - 11/28/14 1443    Subjective Went to beach and did great. Saw MD, ok to D/C today.    Currently in Pain? No/denies   Multiple Pain Sites No            [facility] PT Assessment - 11/28/14 0001    Observation/Other Assessments   Focus on Therapeutic Outcomes (FOTO)  34% limitation CJ now from CK   AROM   Right Knee Extension 10   Right Knee Flexion 120   PROM   PROM Assessment Site --  Knee 2 degrees passive   Strength   Right Knee Flexion  4+/5   Right Knee Extension 4+/5                   [facility] Adult PT Treatment/Exercise - December 02, 2014 0001    Knee/Hip Exercises: Stretches   Active Hamstring Stretch --  review with pt, verbal understanding   Knee/Hip Exercises: Aerobic   Stationary Bike L1 x 10 min,                 PT Education - 2014/12/02 1609    Education provided No          PT Short Term Goals - 2014-12-02 1450    PT SHORT TERM GOAL #1   Title independent with ROM exercises   Time 4   Period Weeks   Status Achieved   PT SHORT TERM GOAL #2   Title sleep improved >/= 25%   Time 4   Period Weeks   Status Achieved  90%   PT SHORT TERM GOAL #3   Title right knee strength >/= 4/5   Time 4   Status Achieved  4+/5   PT SHORT TERM GOAL #4   Title right knee extension PROM</= -5 degrees   Time 4   Period Weeks   Status Achieved  2 degrees passive           PT Long Term Goals - 2014-12-02 1458    PT LONG TERM GOAL #1   Title Independent with HEP for right knee   Time 8   Period Weeks   Status Achieved   PT LONG TERM GOAL #2   Title able to ambulate with an assistive device due to right knee strength >/= 4+/5 without difficulty   Time 8   Period Weeks   Status Achieved   PT LONG  TERM GOAL #3   Title flex right knee AROM >/= 115 so she can go up and down stairs with a a step over step pattern and 1 railing   Time 8   Period Weeks   Status Achieved  120 degrees active   PT LONG TERM GOAL #4   Title pain in right knee decreased >/= 75% so she can resume her household tasks   Time 8   Period Weeks   Status Achieved  100%   PT LONG TERM GOAL #5   Title sleep with pain decreased >/= 75%   Time 8   Period Weeks   Status Achieved               Plan - 12/02/2014 1517    Clinical Impression Statement Patient would like to DC, all goals met,   Pt will benefit from skilled therapeutic intervention in order to improve on the following deficits Abnormal gait;Decreased range of motion;Difficulty walking;Impaired flexibility;Increased edema;Decreased endurance;Decreased activity tolerance;Increased fascial restricitons;Pain;Increased muscle spasms;Decreased balance;Decreased mobility;Decreased strength   Rehab Potential Good   Clinical Impairments Affecting Rehab Potential None   PT Frequency 2x / week   PT Duration 8 weeks   PT Treatment/Interventions Moist Heat;Therapeutic activities;Patient/family education;Scar mobilization;Passive range of motion;Therapeutic exercise;Ultrasound;Gait training;Balance training;Manual techniques;Neuromuscular re-education;Stair training;Cryotherapy;Electrical Stimulation;Functional mobility training   PT Next Visit Plan Discharge today.   PT Home Exercise Plan Continue with current HEP   Consulted and Agree with Plan of Care Patient          G-Codes - Dec 02, 2014 1603    Functional Assessment Tool Used FOTO 34% limitation   Functional Limitation Mobility: Walking and moving around   Mobility: Walking and Moving Around  Current Status [phone]) --   Mobility: Walking and Moving Around Goal Status [phone]) At least 40 percent but less than 60 percent impaired, limited or restricted   Mobility: Walking and Moving Around Discharge  Status [phone]) At least 20 percent but less than 40 percent impaired, limited or restricted      Problem List Patient Active Problem List   Diagnosis Date Noted  . Osteoarthritis of right knee 10/11/2014  . Status post total right knee replacement 10/11/2014  . Primary snoring 07/05/2014  . Bronchiectasis with acute exacerbation 12/29/2013  . Bronchiectasis 11/10/2013  . DM type 2 (diabetes mellitus, type 2) 11/10/2013  . PERSONAL HX COLONIC POLYPS 03/07/2008  . ADENOMATOUS COLONIC POLYP 03/04/2008  . ESOPHAGEAL STRICTURE 03/04/2008  . GERD 03/04/2008  . IBS 03/04/2008  . Dysphagia, pharyngoesophageal phase 02/05/2008  Earlie Counts, PT 11/28/2014 5:15 PM  Myrene Galas, PTA 11/28/2014 5:15 PM  [facility] [address] [place], [place], [address] Phone: [phone]   Fax:  [phone]  PHYSICAL THERAPY DISCHARGE SUMMARY  Visits from Start of Care: 4 Current functional level related to goals / functional outcomes:  See above goals.  Patient has met all of her goals.  Patient has increased knee ROM and strength.  Remaining deficits: Patient is lacking -10 degrees of knee extension.  She will continue at home to further build her strength for terminal knee extension.    Education / Equipment: HEP Plan: Patient agrees to discharge.  Patient goals were met. Patient is being discharged due to meeting the stated rehab goals. Patient wanted to be discharged and was happy with her status.  Thank you for the referral.  ?????  Earlie Counts, PT 11/28/2014 5:15 PM

## 2014-11-30 ENCOUNTER — Encounter: Payer: Medicare Other | Admitting: Physical Therapy

## 2014-12-05 ENCOUNTER — Ambulatory Visit: Payer: Medicare Other | Admitting: Physical Therapy

## 2014-12-07 ENCOUNTER — Encounter: Payer: Medicare Other | Admitting: Physical Therapy

## 2014-12-12 ENCOUNTER — Encounter: Payer: Medicare Other | Admitting: Physical Therapy

## 2014-12-14 ENCOUNTER — Encounter: Payer: Medicare Other | Admitting: Physical Therapy

## 2014-12-19 ENCOUNTER — Encounter: Payer: Medicare Other | Admitting: Physical Therapy

## 2014-12-21 ENCOUNTER — Encounter: Payer: Medicare Other | Admitting: Physical Therapy

## 2015-01-04 ENCOUNTER — Encounter: Payer: Self-pay | Admitting: Adult Health

## 2015-01-04 ENCOUNTER — Ambulatory Visit (INDEPENDENT_AMBULATORY_CARE_PROVIDER_SITE_OTHER): Payer: Medicare Other | Admitting: Adult Health

## 2015-01-04 VITALS — BP 138/88 | HR 78 | Ht 63.0 in | Wt 154.0 lb

## 2015-01-04 DIAGNOSIS — Z23 Encounter for immunization: Secondary | ICD-10-CM | POA: Diagnosis not present

## 2015-01-04 DIAGNOSIS — J479 Bronchiectasis, uncomplicated: Secondary | ICD-10-CM

## 2015-01-04 NOTE — Progress Notes (Signed)
   Subjective:    Patient ID: Tiffany [name], female    DOB: 1951/03/09, 64 y.o.   MRN: [id number]  HPI   64 year old, retired [employer] scientist, never smoker presents for FU of bronchiectasis and pulmonary nodule. She developed cough, dyspnea and fatigue since December 2014, received 2 different rounds of antibiotics and was admitted 3/17-3/21/15 for hypoxia and bronchospasm. Chest x-ray did not show any infiltrates.CT angiogram chest was negative for pulmonary embolism, showed extensive bronchiectatic changes bilaterally, with nodularity and interstitial prominence in both lower lobes. There was an 8 mm nodule in the right middle lobe which was the largest. There was a faint infiltrate in the left upper lobe. She improved with IV antibiotics and was discharged on dulera and albuterol nebs. Her wheezing and dyspnea has subsided but she still has a dry cough, this does suppress with Mucinex DM and Claritin.   Her past medical history significant for spina bifida, Nissen's fundoplication in the 54M,[id number] infusion reaction in 2012, hypertension and diabetes  01/2008 UGI series showed Achalasia-like appearance of the distal esophagus, s/o stricture of the distal esophagus  Spirometry showed no evidence of airway obstruction normal lung function   CT 06/2014 Complete resolution of multifocal peribronchovascular micronodularity noted on the prior study, indicating that the findings were presumably related to bronchitis and early multilobar bronchopneumonia on the prior examination. The small 3 mm left lower lobe nodule noted on the prior examination has completely resolved. The larger nodule in the right lung (inferior aspect of the right upper lobe abutting the minor fissure) is much smaller  Last saw dr Henrene Pastor in 08/2012 - had esophageal dilatation in the past  Husband c/o loud snoring which has caused him to leave the bedroom ESS 1/24 Bedtime 11p, latency 63mins, no noct awakenings, oob by 10A, no  am headaches or dryness of mouth There is no history suggestive of cataplexy, sleep paralysis or parasomnias   01/04/2015 6 month follow up Bronchiectsis  Returns for 6 month follow up  Says she is doing very well.  No flare of cough or wheezing.  Had 1 abx since last ov , during mid winter with bronchitis .   Doing very well , except has Frequent UTI . F/by PCP.  No macrobid use.   PVX utd . No prevnar .   Had knee replacement few months ago, doing very well.   Review of Systems neg for any significant sore throat, dysphagia, itching, sneezing, nasal congestion or excess/ purulent secretions, fever, chills, sweats, unintended wt loss, pleuritic or exertional cp, hempoptysis, orthopnea pnd or change in chronic leg swelling. Also denies presyncope, palpitations, heartburn, abdominal pain, nausea, vomiting, diarrhea or change in bowel or urinary habits, dysuria,hematuria, rash, arthralgias, visual complaints, headache, numbness weakness or ataxia.     Objective:   Physical Exam  Gen. Pleasant, well-nourished, in no distress, normal affect ENT - no lesions, no post nasal drip Neck: No JVD, no thyromegaly, no carotid bruits Lungs: no use of accessory muscles, no dullness to percussion, clear without rales or rhonchi  Cardiovascular: Rhythm regular, heart sounds  normal, no murmurs or gallops, no peripheral edema Abdomen: soft and non-tender, no hepatosplenomegaly, BS normal. Musculoskeletal: No deformities, no cyanosis or clubbing Neuro:  alert, non focal       Assessment & Plan:

## 2015-01-04 NOTE — Patient Instructions (Signed)
Continue on current regimen.  Follow up with Dr. Elsworth Soho in 6 months  Prevnar vaccine today .

## 2015-01-05 NOTE — Assessment & Plan Note (Signed)
Stable without flare  Plan   Continue on current regimen.  Follow up with Dr. Elsworth Soho in 6 months  Prevnar vaccine today .

## 2015-01-09 NOTE — Progress Notes (Signed)
Reviewed & agree with plan  

## 2015-03-28 ENCOUNTER — Telehealth: Payer: Self-pay | Admitting: Internal Medicine

## 2015-03-28 ENCOUNTER — Encounter: Payer: Self-pay | Admitting: Internal Medicine

## 2015-03-28 NOTE — Telephone Encounter (Signed)
Left message to call back  

## 2015-03-28 NOTE — Telephone Encounter (Signed)
New Message       Pt calling stating that she wants a sooner appt than the one she has due to her most recent issues, pt states waiting a month to see someone is not ok. Please call back and advise.

## 2015-03-29 NOTE — Telephone Encounter (Signed)
Spoke with pt and she states that Dr. Dagmar Hait had wanted pt to be seen sooner than 8/30. Pt states that Amy from Dr. Danna Hefty office was going to call to see about a sooner appt. Scheduled pt 8/5 with Dr. Harrington Challenger.

## 2015-03-29 NOTE — Telephone Encounter (Signed)
F/u ° ° ° °Pt returning phone call. °

## 2015-03-29 NOTE — Telephone Encounter (Signed)
Left message to call back  

## 2015-03-30 NOTE — Progress Notes (Signed)
Cardiology Office Note   Date:  03/31/2015   ID:  Tiffany, [name] 20-Mar-1951, MRN [id number]  PCP:  Tivis Ringer, MD  Cardiologist:   Dorris Carnes, MD   Chief Complaint  Patient presents with  . Follow-up    atypical chest pain, SOB, coronary calcification      History of Present Illness: Tiffany [name] is a 64 y.o. female with a history of bronchiectaisis , pulm nodule, HTN, Spina bifida, achalasia  Palpitations  And htn  I saw the pt in Feb  Part of preop  Felt OK to proceed.  I set f/u PRN    Pt complains of muscular ache across chest Saturday night was horrible  Went to jaw and L arm  .  Has GERD but usual measures did not relieve.  Laying down made it worse  Was clammy and sweaty  Took 3 ASA  Chewed.  EMS arrived  EKG was   No acute changes    Pt says she has been feeling very fatigued.  More than usual   Had some dizziness  Comes and goes   Saw Dr Danna Hefty PA this week    Current Outpatient Prescriptions  Medication Sig Dispense Refill  . acetaminophen (TYLENOL) 500 MG tablet Take 1,000 mg by mouth every 8 (eight) hours as needed for mild pain or moderate pain.    . Aflibercept (EYLEA IO) Inject into the eye every 3 (three) months.    . Calcium Carbonate-Vit D-Min (CALCIUM 1200 PO) Take 1 tablet by mouth daily.    . Cholecalciferol (VITAMIN D) 2000 UNITS CAPS Take 2 capsules by mouth daily.    . diazepam (VALIUM) 10 MG tablet Take 10 mg by mouth at bedtime.    . diphenhydrAMINE (BENADRYL) 25 mg capsule Take 25 mg by mouth daily as needed for allergies.     . DULoxetine (CYMBALTA) 60 MG capsule Take 60 mg by mouth daily.  1  . EPIPEN 2-PAK 0.3 MG/0.3ML SOAJ injection as needed.  0  . furosemide (LASIX) 40 MG tablet Take 40 mg by mouth daily as needed for fluid or edema.     Marland Kitchen loratadine (CLARITIN) 10 MG tablet Take 10 mg by mouth daily as needed for allergies.     . metFORMIN (GLUCOPHAGE) 500 MG tablet Take 500 mg by mouth daily with breakfast.     . metoprolol  succinate (TOPROL-XL) 50 MG 24 hr tablet Take 50 mg by mouth daily.  1  . omeprazole (PRILOSEC) 20 MG capsule Take 20 mg by mouth daily.    . ondansetron (ZOFRAN) 4 MG tablet Take 1 tablet (4 mg total) by mouth every 6 (six) hours as needed for nausea. 20 tablet 0  . ONE TOUCH ULTRA TEST test strip as needed.  1  . ONETOUCH DELICA LANCETS 22L MISC as directed.  1  . Polyethyl Glycol-Propyl Glycol (SYSTANE ULTRA) 0.4-0.3 % SOLN Apply 1-2 drops to eye daily.    . Probiotic Product (ALIGN) 4 MG CAPS Take 1 capsule by mouth daily as needed (IBS).     Marland Kitchen promethazine (PHENERGAN) 25 MG tablet Take 25 mg by mouth every 6 (six) hours as needed for nausea or vomiting.     . simvastatin (ZOCOR) 80 MG tablet Take 80 mg by mouth every morning.     . tobramycin (TOBREX) 0.3 % ophthalmic solution Place 1 drop into the right eye every 6 (six) hours. The day before, the day of, and the after procedure.  No current facility-administered medications for this visit.    Allergies:   Ambien; Aspirin; Benicar; Ketorolac tromethamine; Latex; Lisinopril; Other; Reclast; Toradol; Codeine; Erythromycin; Fenofibrate; Glucosamine forte; Levaquin; Percocet; Tramadol; and Tizanidine   Past Medical History  Diagnosis Date  . Diabetes mellitus   . Meningitis   . Hypertension   . GERD (gastroesophageal reflux disease)   . Hiatal hernia   . Gastritis   . IBS (irritable bowel syndrome)   . Depression   . Colon polyps   . Spina bifida occulta   . Insomnia   . Allergy   . Cataract     REMOVED  . Hyperlipidemia   . Osteoporosis   . Ulcer   . Dysrhythmia     RAPID HEART BEAT , CHEST TIGHTNESS  09/29/14     . Pneumonia     BRONCHIECTASIS  . Chronic cough   . Dysphagia   . Shortness of breath dyspnea     WITH EXERTION  . Bronchiectasis   . Macular disorder     MACULAR DISEASE  RT EYE   . Anxiety   . Cancer     SKIN    . Kidney stones     KIDNEY DISEASE   STAGE lll    . Kidney stones   . Arthritis      foot drop  lt foot  / toes   wears brace     . Anginal pain     Past Surgical History  Procedure Laterality Date  . Back surgery    . Abdominal hysterectomy    . Tonsillectomy    . Polypectomy    . Meniscus repair    . Fundoplication    . Lithotripsy    . Elbow surgery      left  . Cataract extraction  03/2012    bilateral  . Colonoscopy    . Knee arthroscopy      rt knee   . Eardrum      left   ruptured and repaired  . Total knee arthroplasty Right 10/11/2014    Procedure: RIGHT TOTAL KNEE ARTHROPLASTY;  Surgeon: Mcarthur Rossetti, MD;  Location: [place];  Service: Orthopedics;  Laterality: Right;     Social History:  The patient  reports that she has never smoked. She has never used smokeless tobacco. She reports that she does not drink alcohol or use illicit drugs.   Family History:  The patient's family history includes Alzheimer's disease in her father; Cancer in her mother; Colon cancer in her maternal grandmother and mother; Coronary artery disease in an other family member; Diabetes in her mother; Gout in her father; Hypertension in her mother; Irritable bowel syndrome in her father; Nephrolithiasis in her brother and mother; Ovarian cancer in her mother; Thyroid cancer in her sister; Ulcers in her father.    ROS:  Please see the history of present illness. All other systems are reviewed and  Negative to the above problem except as noted.    PHYSICAL EXAM: VS:  BP 130/82 mmHg  Pulse 75  Ht 5\' 3"  (1.6 m)  Wt 157 lb 9.6 oz (71.487 kg)  BMI 27.92 kg/m2  SpO2 96%  GEN: Well nourished, well developed, in no acute distress HEENT: normal Neck: no JVD, carotid bruits, or masses Chest  Tender to palpation   Cardiac: RRR; no murmurs, rubs, or gallops,no edema  Respiratory:  clear to auscultation bilaterally, normal work of breathing GI: soft, nontender, nondistended, + BS  No hepatomegaly  MS: no deformity Moving all extremities   Skin: warm and dry, no rash Neuro:   Strength and sensation are intact Psych: euthymic mood, full affect   EKG:  EKG is not ordered today.   Lipid Panel    Component Value Date/Time   CHOL * 10/23/2010 0740    226        ATP III CLASSIFICATION:  <200     mg/dL   Desirable  200-239  mg/dL   Borderline High  >=240    mg/dL   High          TRIG 381* 10/23/2010 0740   HDL 44 10/23/2010 0740   CHOLHDL 5.1 10/23/2010 0740   VLDL 76* 10/23/2010 0740   LDLCALC * 10/23/2010 0740    106        Total Cholesterol/HDL:CHD Risk Coronary Heart Disease Risk Table                     Men   Women  1/2 Average Risk   3.4   3.3  Average Risk       5.0   4.4  2 X Average Risk   9.6   7.1  3 X Average Risk  23.4   11.0        Use the calculated Patient Ratio above and the CHD Risk Table to determine the patient's CHD Risk.        ATP III CLASSIFICATION (LDL):  <100     mg/dL   Optimal  100-129  mg/dL   Near or Above                    Optimal  130-159  mg/dL   Borderline  160-189  mg/dL   High  >190     mg/dL   Very High      Wt Readings from Last 3 Encounters:  03/31/15 157 lb 9.6 oz (71.487 kg)  01/04/15 154 lb (69.854 kg)  10/11/14 163 lb (73.936 kg)      ASSESSMENT AND PLAN:  1.  Chest pain  Current symptoms appear to be muscular  Also question GI input.  More concerning is dyspnea and fatiuge   Will set up [place] scan from [facility] for review of  Calcifications   2.  HL Pt on statin    Get liipds from Dr Dagmar Hait   Activites as tolerated      Signed, Dorris Carnes, MD  03/31/2015 8:31 AM    [address], [place], [place]  [address] Phone: [phone]; Fax: [phone]

## 2015-03-31 ENCOUNTER — Ambulatory Visit (INDEPENDENT_AMBULATORY_CARE_PROVIDER_SITE_OTHER): Payer: Medicare Other | Admitting: Internal Medicine

## 2015-03-31 ENCOUNTER — Encounter: Payer: Self-pay | Admitting: Internal Medicine

## 2015-03-31 VITALS — BP 130/82 | HR 75 | Ht 63.0 in | Wt 157.6 lb

## 2015-03-31 DIAGNOSIS — R079 Chest pain, unspecified: Secondary | ICD-10-CM | POA: Diagnosis not present

## 2015-03-31 DIAGNOSIS — R0602 Shortness of breath: Secondary | ICD-10-CM | POA: Diagnosis not present

## 2015-03-31 NOTE — Patient Instructions (Signed)
Medication Instructions: - Your physician recommends that you continue on your current medications as directed. Please refer to the Current Medication list given to you today.  Labwork: - none  Procedures/Testing: - Your physician has requested that you have a lexiscan myoview. For further information please visit [email or web address]. Please follow instruction sheet, as given.  Follow-Up: - Your physician wants you to follow-up in: 6 months with Dr. Harrington Challenger (February 2017). You will receive a reminder letter in the mail two months in advance. If you don't receive a letter, please call our office to schedule the follow-up appointment.  Any Additional Special Instructions Will Be Listed Below (If Applicable). - none

## 2015-04-03 ENCOUNTER — Telehealth (HOSPITAL_COMMUNITY): Payer: Self-pay

## 2015-04-03 NOTE — Telephone Encounter (Signed)
Encounter complete. 

## 2015-04-05 ENCOUNTER — Ambulatory Visit (HOSPITAL_COMMUNITY): Payer: Medicare Other | Attending: Cardiology

## 2015-04-05 DIAGNOSIS — R0602 Shortness of breath: Secondary | ICD-10-CM

## 2015-04-05 DIAGNOSIS — R079 Chest pain, unspecified: Secondary | ICD-10-CM

## 2015-04-05 LAB — MYOCARDIAL PERFUSION IMAGING
CHL CUP RESTING HR STRESS: 62 {beats}/min
LHR: 0.32
LV sys vol: 24 mL
LVDIAVOL: 67 mL
NUC STRESS TID: 1.04
Peak HR: 113 {beats}/min
SDS: 2
SRS: 5
SSS: 7

## 2015-04-05 MED ORDER — TECHNETIUM TC 99M SESTAMIBI GENERIC - CARDIOLITE
10.1000 | Freq: Once | INTRAVENOUS | Status: AC | PRN
  Administered 2015-04-05: 10.1 via INTRAVENOUS

## 2015-04-05 MED ORDER — REGADENOSON 0.4 MG/5ML IV SOLN
0.4000 mg | Freq: Once | INTRAVENOUS | Status: AC
  Administered 2015-04-05: 0.4 mg via INTRAVENOUS

## 2015-04-05 MED ORDER — TECHNETIUM TC 99M SESTAMIBI GENERIC - CARDIOLITE
30.0000 | Freq: Once | INTRAVENOUS | Status: AC | PRN
  Administered 2015-04-05: 30 via INTRAVENOUS

## 2015-04-10 ENCOUNTER — Other Ambulatory Visit (HOSPITAL_COMMUNITY): Payer: Self-pay | Admitting: Internal Medicine

## 2015-04-10 DIAGNOSIS — Z1231 Encounter for screening mammogram for malignant neoplasm of breast: Secondary | ICD-10-CM

## 2015-04-13 ENCOUNTER — Telehealth: Payer: Self-pay | Admitting: Internal Medicine

## 2015-04-13 ENCOUNTER — Ambulatory Visit (HOSPITAL_COMMUNITY)
Admission: RE | Admit: 2015-04-13 | Discharge: 2015-04-13 | Disposition: A | Discharge status: Home / Self Care | Payer: Medicare Other | Source: Ambulatory Visit | Attending: Internal Medicine | Admitting: Internal Medicine

## 2015-04-13 DIAGNOSIS — Z1231 Encounter for screening mammogram for malignant neoplasm of breast: Secondary | ICD-10-CM | POA: Insufficient documentation

## 2015-04-13 NOTE — Telephone Encounter (Signed)
I spoke with Tiffany [name] and told her study had not been reviewed by Dr. Harrington Challenger yet but physician reading it indicated it was a normal study.  Tiffany [name] states she is feeling good.  I told Tiffany [name] we would call her back if Dr. Harrington Challenger had any additional comments or recommendations after reviewing.

## 2015-04-13 NOTE — Telephone Encounter (Signed)
New Message  Pt calling about myoview results. Please call back and discuss.

## 2015-04-18 ENCOUNTER — Other Ambulatory Visit: Payer: Self-pay | Admitting: Internal Medicine

## 2015-04-18 DIAGNOSIS — R928 Other abnormal and inconclusive findings on diagnostic imaging of breast: Secondary | ICD-10-CM

## 2015-04-24 ENCOUNTER — Ambulatory Visit
Admission: RE | Admit: 2015-04-24 | Discharge: 2015-04-24 | Disposition: A | Discharge status: Home / Self Care | Payer: Medicare Other | Source: Ambulatory Visit | Attending: Internal Medicine | Admitting: Internal Medicine

## 2015-04-24 DIAGNOSIS — R928 Other abnormal and inconclusive findings on diagnostic imaging of breast: Secondary | ICD-10-CM

## 2015-04-25 ENCOUNTER — Telehealth: Payer: Self-pay | Admitting: Internal Medicine

## 2015-04-25 ENCOUNTER — Ambulatory Visit: Payer: Medicare Other | Admitting: Physician Assistant

## 2015-04-25 DIAGNOSIS — E785 Hyperlipidemia, unspecified: Secondary | ICD-10-CM

## 2015-04-25 DIAGNOSIS — R079 Chest pain, unspecified: Secondary | ICD-10-CM

## 2015-04-25 DIAGNOSIS — E538 Deficiency of other specified B group vitamins: Secondary | ICD-10-CM

## 2015-04-25 DIAGNOSIS — R0602 Shortness of breath: Secondary | ICD-10-CM

## 2015-04-25 NOTE — Telephone Encounter (Signed)
New message ° ° ° ° ° °Returning a nurses call °

## 2015-04-25 NOTE — Telephone Encounter (Signed)
Follow up    Pt calling in regards to test results

## 2015-04-26 NOTE — Telephone Encounter (Signed)
Pt already received results of recent myoview.   Was unable to determine who called her on Monday, after 5pm, from this office, as she states that is when she received the message to call.  Reviewed last OV note. Will try to obtain labs from PCP and CT from [facility] from May 2016 for Dr. Harrington Challenger review on Friday. Advised patient I will contact her back with any new information.

## 2015-04-26 NOTE — Telephone Encounter (Signed)
Left message on medical records voicemail ([email or web address]) for all recent lab work and a copy of the CT scan that was done in May.

## 2015-04-28 ENCOUNTER — Encounter: Payer: Self-pay | Admitting: Internal Medicine

## 2015-05-02 NOTE — Telephone Encounter (Signed)
Labs that were received were from Dec 2015.  Reviewed with Dr. Harrington Challenger. Called patient and instructed to repeat lipids.  She will come this Friday for lab.  She mentions that her B12 level was found to be very low and has started receiving B12 injections.    She is wondering if many of her recent symptoms (including the chest pain, SOB) were caused from this.  She returns to PCP 9/19.  Pt aware I will forward this information to Dr. Harrington Challenger.

## 2015-05-03 NOTE — Telephone Encounter (Signed)
done

## 2015-05-03 NOTE — Telephone Encounter (Signed)
Set up for CBC, B12 level as well when she comes in

## 2015-05-05 ENCOUNTER — Other Ambulatory Visit (INDEPENDENT_AMBULATORY_CARE_PROVIDER_SITE_OTHER): Payer: Medicare Other | Admitting: *Deleted

## 2015-05-05 DIAGNOSIS — R0602 Shortness of breath: Secondary | ICD-10-CM | POA: Diagnosis not present

## 2015-05-05 DIAGNOSIS — R079 Chest pain, unspecified: Secondary | ICD-10-CM | POA: Diagnosis not present

## 2015-05-05 DIAGNOSIS — E785 Hyperlipidemia, unspecified: Secondary | ICD-10-CM | POA: Diagnosis not present

## 2015-05-05 DIAGNOSIS — E538 Deficiency of other specified B group vitamins: Secondary | ICD-10-CM | POA: Diagnosis not present

## 2015-05-05 LAB — CBC
HEMATOCRIT: 35.3 % — AB (ref 36.0–46.0)
HEMOGLOBIN: 11.8 g/dL — AB (ref 12.0–15.0)
MCHC: 33.5 g/dL (ref 30.0–36.0)
MCV: 89.9 fl (ref 78.0–100.0)
Platelets: 204 10*3/uL (ref 150.0–400.0)
RBC: 3.93 Mil/uL (ref 3.87–5.11)
RDW: 13.4 % (ref 11.5–15.5)
WBC: 6.2 10*3/uL (ref 4.0–10.5)

## 2015-05-05 LAB — LDL CHOLESTEROL, DIRECT: Direct LDL: 94 mg/dL

## 2015-05-05 LAB — LIPID PANEL
CHOLESTEROL: 191 mg/dL (ref 0–200)
HDL: 43.7 mg/dL (ref >39.00)
NonHDL: 146.89
TRIGLYCERIDES: 271 mg/dL — AB (ref 0.0–149.0)
Total CHOL/HDL Ratio: 4
VLDL: 54.2 mg/dL — AB (ref 0.0–40.0)

## 2015-05-05 LAB — VITAMIN B12: VITAMIN B 12: 723 pg/mL (ref 211–911)

## 2015-07-06 ENCOUNTER — Encounter: Payer: Self-pay | Admitting: Internal Medicine

## 2015-07-31 ENCOUNTER — Encounter: Payer: Self-pay | Admitting: Pulmonary Disease

## 2015-07-31 ENCOUNTER — Ambulatory Visit (INDEPENDENT_AMBULATORY_CARE_PROVIDER_SITE_OTHER): Payer: Medicare Other | Admitting: Pulmonary Disease

## 2015-07-31 VITALS — BP 152/98 | HR 102

## 2015-07-31 DIAGNOSIS — J471 Bronchiectasis with (acute) exacerbation: Secondary | ICD-10-CM | POA: Diagnosis not present

## 2015-07-31 DIAGNOSIS — R0683 Snoring: Secondary | ICD-10-CM

## 2015-07-31 MED ORDER — AZITHROMYCIN 250 MG PO TABS: 250.0000 mg | ORAL_TABLET | ORAL | Status: DC

## 2015-07-31 NOTE — Assessment & Plan Note (Addendum)
She has various antibiotic allergies -hence go with Zpak Mucinex 600 mg twice daily x 1 week Call if no better in 5 days Warm teas ok May need longer duration of Abx

## 2015-07-31 NOTE — Progress Notes (Signed)
   Subjective:    Patient ID: Tiffany [name], female    DOB: Feb 02, 1951, 64 y.o.   MRN: [id number]  HPI  64 year old, retired Marine scientist, never smoker presents for FU of bronchiectasis  She developed cough, dyspnea and fatigue since December 2014, received 2 different rounds of antibiotics and was admitted 3/17-3/21/15 for hypoxia and bronchospasm and infiltrates on CT that resolved on follow-up imaging  Her past medical history significant for spina bifida, Nissen's fundoplication in the [id number] infusion reaction in 2012, hypertension and diabetes Last saw dr Henrene Pastor in 08/2012 - had esophageal dilatation in the past 01/2008 UGI series showed Achalasia-like appearance of the distal esophagus, s/o stricture of the distal esophagus   07/31/2015  Chief Complaint  Patient presents with  . Acute Visit    SOB, coughing up green mucus, loss of voice. no fever.  Hard to get a deep breath.     BP high - amlodipin added Metoprolol dose increased C/o green s putum, no fever Wonders if she should take albuterol - has high HR  Husband c/o loud snoring which has caused him to leave the bedroom ESS 1/24 Bedtime 11p, latency 55mins, no noct awakenings, oob by 10A, no am headaches or dryness of mouth, never got the sleep study  Had knee replacement 2016, doing very well.   Significant tests/ events  12/2013 Spirometry showed no evidence of airway obstruction normal lung function  10/2013 CT angio chest  was negative for pulmonary embolism, showed extensive bronchiectatic changes bilaterally, with nodularity and interstitial prominence in both lower lobes. There was an 8 mm nodule in the right middle lobe which was the largest. There was a faint infiltrate in the left upper lobe.  CT 06/2014 Complete resolution of multifocal peribronchovascular micronodularity noted on the prior study. The small 3 mm left lower lobe nodule noted on the prior examination has completely resolved. The larger  nodule in the right lung  much smaller   Review of Systems  neg for any significant sore throat, dysphagia, itching, sneezing, nasal congestion or excess/ purulent secretions, fever, chills, sweats, unintended wt loss, pleuritic or exertional cp, hempoptysis, orthopnea pnd or change in chronic leg swelling. Also denies presyncope, palpitations, heartburn, abdominal pain, nausea, vomiting, diarrhea or change in bowel or urinary habits, dysuria,hematuria, rash, arthralgias, visual complaints, headache, numbness weakness or ataxia.      Objective:   Physical Exam  Gen. Pleasant, well-nourished, in no distress ENT - no lesions, no post nasal drip Neck: No JVD, no thyromegaly, no carotid bruits Lungs: no use of accessory muscles, no dullness to percussion, clear without rales or rhonchi  Cardiovascular: Rhythm regular, heart sounds  normal, no murmurs or gallops, no peripheral edema Musculoskeletal: No deformities, no cyanosis or clubbing         Assessment & Plan:

## 2015-07-31 NOTE — Patient Instructions (Signed)
Zpak Mucinex 600 mg twice daily x 1 week Call if no better in 5 days Warm teas ok Call us to schedule home sleep study once better

## 2015-07-31 NOTE — Assessment & Plan Note (Signed)
Call us to schedule home sleep study once better Persistent loud snoring with some degree of somnolence & nonrefreshing sleep

## 2015-08-09 ENCOUNTER — Telehealth: Payer: Self-pay | Admitting: Internal Medicine

## 2015-08-09 NOTE — Telephone Encounter (Signed)
PT AT  THIS  TIME  HAS NO PROBLEMS   DOES NOT  WISH  TO MAKE  AN APPT  WITH   DR  ROSS  ALSO  WANTED TO  KNOW  LAST  LABS  RESULTS  REVIEWED  WITH  PT  AND   ALSO   INFORMED   MAY LOOK  AT   MY CHART    FOR  RESULTS  VERBALIZED  UNDERSTANDING .Adonis Housekeeper

## 2015-08-09 NOTE — Telephone Encounter (Signed)
New Message  Pt called for labs. She states that she has called a few months prior with no response. Pt states that she is not sure if a follow up appt is needed until she receives a call back to discuss labs. Please call

## 2015-08-16 ENCOUNTER — Ambulatory Visit (INDEPENDENT_AMBULATORY_CARE_PROVIDER_SITE_OTHER): Payer: Medicare Other | Admitting: Neurology

## 2015-08-16 ENCOUNTER — Encounter: Payer: Self-pay | Admitting: Neurology

## 2015-08-16 VITALS — BP 142/90 | HR 86 | Resp 20 | Ht 63.0 in | Wt 160.0 lb

## 2015-08-16 DIAGNOSIS — I10 Essential (primary) hypertension: Secondary | ICD-10-CM

## 2015-08-16 DIAGNOSIS — Q057 Lumbar spina bifida without hydrocephalus: Secondary | ICD-10-CM | POA: Insufficient documentation

## 2015-08-16 DIAGNOSIS — R51 Headache: Secondary | ICD-10-CM | POA: Diagnosis not present

## 2015-08-16 DIAGNOSIS — R519 Headache, unspecified: Secondary | ICD-10-CM

## 2015-08-16 DIAGNOSIS — R0683 Snoring: Secondary | ICD-10-CM | POA: Insufficient documentation

## 2015-08-16 DIAGNOSIS — R200 Anesthesia of skin: Secondary | ICD-10-CM | POA: Diagnosis not present

## 2015-08-16 DIAGNOSIS — R202 Paresthesia of skin: Secondary | ICD-10-CM

## 2015-08-16 NOTE — Progress Notes (Signed)
[facility]   Provider:  Larey Seat, M D  Referring Provider: Prince Solian, MD Primary Care Physician:  Tivis Ringer, MD  Chief Complaint  Patient presents with  . New Patient (Initial Visit)    spina bifita, numbness and tingling in upper legs and back pain, has had difficulty moving foot from gas to brake, rm 11, alone    HPI:  Tiffany [name] is a 64 y.o. female , seen here as a referral  from Dr. Dagmar Hait for a re- evaluation of spina bifida,   Chief complaint according to patient : Tiffany [name] has a history of speed RBC to but was never diagnosed with a tethered cord she presented after a fall in 2010 upon request of her neurosurgeon Dr. Kristeen Miss. I saw her on 01-30-09. She characterized her discomfort as pains shooting from the lowest spine upwards to shoulder and neck area and use the term "brain freeze" to describe the sensation.  The patient's history begins with meningitis and the year 1964. In the year 1972 she underwent a tonsillectomy, she had a ruptured left eardrum repair in 1979, partial hysterectomy 1989, in 1991 she underwent surgery for spina bifida at [facility] under the guidance of Dr. Tommie Raymond. 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 She had a ventral meningocele / ventral band.  During our visit in 01-30-09 she had no urinary current incontinence but had bowel incontinence already. She has microhematuria, developed kidney stones repeatedly. She had an unremarkable pregnancy. until 1990 when she developed a foot drop.  In April 2001 she underwent  Fundoplication, and several " oesophageal stretchings"  Dr. Emiliano Dyer  then referred her to Dr. Erling Cruz who referred them to Dr. Quentin Cornwall and Ellene Route. Precipitated by a fall downstairs. She was immediately referred to [facility] and Dr. Faith Rogue worked on her spinal bifida. She return to work in 2 months. She had sometimes used a walker but mostly can do with a cane, since 2002 she wore braces he uses them to prevent a foot drop. She was last hospitalized in March 2015 was a pneumonia refractory to outpatient management. She was in respiratory distress at the time, hypoxemic had bronchiectasis, multiple pulmonary nodules.  She has now progressive ascending numbness to the thigh.  She underwent total knee replacement of the right knee .She has noted the right foot being stuck on the foot pedal when driving.  She has been worked up by Dr. Dagmar Hait and diagnosed with a B 12. She has declining vision and takes injections into the right eye, has a grey haze over her eyes. Dx: Macular degeneration.    Review of Systems: Out of a complete 14 system review, the patient complains of only the following symptoms, and all other reviewed systems are negative.  The patient endorsed hearing loss and vision loss fatigue shortness of breath, snoring, blood in her stool and in her urine, trouble swallowing.   Social History   Social History  . Marital Status: Married    Spouse Name: N/A  . Number of Children: 2  . Years of Education: N/A   Occupational History  . Retired     Corporate treasurer   Social History Main Topics  . Smoking status:  Never Smoker   . Smokeless tobacco: Never Used  . Alcohol Use: No     Comment: OCC.  . Drug Use: No  . Sexual Activity: Not on file   Other Topics Concern  . Not on file   Social History Narrative    Family History  Problem Relation Age of Onset  . Alzheimer's disease Father   . Irritable bowel syndrome Father   . Ulcers Father   . Gout Father   . Cancer Mother     ?  . Diabetes Mother   . Hypertension Mother   . Nephrolithiasis Mother   . Colon  cancer Mother   . Ovarian cancer Mother   . Coronary artery disease      grandparent  . Thyroid cancer Sister   . Nephrolithiasis Brother   . Colon cancer Maternal Grandmother     Past Medical History  Diagnosis Date  . Diabetes mellitus   . Meningitis   . Hypertension   . GERD (gastroesophageal reflux disease)   . Hiatal hernia   . Gastritis   . IBS (irritable bowel syndrome)   . Depression   . Colon polyps   . Spina bifida occulta   . Insomnia   . Allergy   . Cataract     REMOVED  . Hyperlipidemia   . Osteoporosis   . Ulcer   . Dysrhythmia     RAPID HEART BEAT , CHEST TIGHTNESS  09/29/14     . Pneumonia     BRONCHIECTASIS  . Chronic cough   . Dysphagia   . Shortness of breath dyspnea     WITH EXERTION  . Bronchiectasis ([place])   . Macular disorder     MACULAR DISEASE  RT EYE   . Anxiety   . Cancer ([place])     SKIN    . Kidney stones     KIDNEY DISEASE   STAGE lll    . Kidney stones   . Arthritis     foot drop  lt foot  / toes   wears brace     . Anginal pain [facility])     Past Surgical History  Procedure Laterality Date  . Back surgery    . Abdominal hysterectomy    . Tonsillectomy    . Polypectomy    . Meniscus repair    . Fundoplication    . Lithotripsy    . Elbow surgery      left  . Cataract extraction  03/2012    bilateral  . Colonoscopy    . Knee arthroscopy      rt knee   . Eardrum      left   ruptured and repaired  . Total knee arthroplasty Right 10/11/2014    Procedure: RIGHT TOTAL KNEE ARTHROPLASTY;  Surgeon: Mcarthur Rossetti, MD;  Location: [place];  Service: Orthopedics;  Laterality: Right;    Current Outpatient Prescriptions  Medication Sig Dispense Refill  . acetaminophen (TYLENOL) 500 MG tablet Take 1,000 mg by mouth every 8 (eight) hours as needed for mild pain or moderate pain.    . Aflibercept (EYLEA IO) Inject into the eye every 3 (three) months.    Marland Kitchen amLODipine (NORVASC) 2.5 MG tablet Take 2.5 mg by mouth daily.    . Calcium  Carbonate-Vit D-Min (CALCIUM 1200 PO) Take 1 tablet by mouth daily.    . Cholecalciferol (VITAMIN D) 2000 UNITS CAPS Take 2 capsules by mouth daily.    . diazepam (VALIUM) 10  MG tablet Take 10 mg by mouth at bedtime.    . DULoxetine (CYMBALTA) 60 MG capsule Take 60 mg by mouth daily.  1  . EPIPEN 2-PAK 0.3 MG/0.3ML SOAJ injection as needed.  0  . furosemide (LASIX) 40 MG tablet Take 40 mg by mouth daily as needed for fluid or edema.     Marland Kitchen loratadine (CLARITIN) 10 MG tablet Take 10 mg by mouth daily as needed for allergies.     . metFORMIN (GLUCOPHAGE) 500 MG tablet Take 500 mg by mouth 2 (two) times daily.     . metoprolol succinate (TOPROL-XL) 50 MG 24 hr tablet Take 100 mg by mouth daily.   1  . omeprazole (PRILOSEC) 20 MG capsule Take 20 mg by mouth daily.    . ONE TOUCH ULTRA TEST test strip as needed.  1  . ONETOUCH DELICA LANCETS 99991111 MISC as directed.  1  . Polyethyl Glycol-Propyl Glycol (SYSTANE ULTRA) 0.4-0.3 % SOLN Apply 1-2 drops to eye daily.    . Probiotic Product (ALIGN) 4 MG CAPS Take 1 capsule by mouth daily as needed (IBS).     Marland Kitchen promethazine (PHENERGAN) 25 MG tablet Take 25 mg by mouth every 6 (six) hours as needed for nausea or vomiting.     . simvastatin (ZOCOR) 80 MG tablet Take 80 mg by mouth every morning.     . tobramycin (TOBREX) 0.3 % ophthalmic solution Place 1 drop into the right eye every 6 (six) hours. The day before, the day of, and the after procedure.     No current facility-administered medications for this visit.    Allergies as of 08/16/2015 - Review Complete 08/16/2015  Allergen Reaction Noted  . Ambien [zolpidem tartrate] Other (See Comments) 09/07/2012  . Aspirin Other (See Comments) 09/07/2012  . Benicar [olmesartan medoxomil] Shortness Of Breath, Swelling, and Other (See Comments) 11/24/2011  . Ketorolac tromethamine Anaphylaxis 11/24/2011  . Latex Shortness Of Breath and Itching 11/09/2013  . Lisinopril Shortness Of Breath, Swelling, and Other (See  Comments)   . Other Anaphylaxis 11/09/2013  . Reclast [zoledronic acid] Other (See Comments) 09/07/2012  . Toradol [ketorolac tromethamine] Anaphylaxis 09/07/2012  . Codeine Nausea And Vomiting   . Erythromycin Nausea And Vomiting   . Fenofibrate Nausea And Vomiting 09/07/2012  . Glucosamine forte [nutritional supplements] Diarrhea and Nausea Only 09/07/2012  . Levaquin [levofloxacin in d5w] Diarrhea and Swelling 09/07/2012  . Percocet [oxycodone-acetaminophen] Nausea And Vomiting 09/07/2012  . Tramadol Nausea And Vomiting 09/07/2012  . Tizanidine Nausea And Vomiting and Other (See Comments) 09/07/2012    Vitals: BP 142/90 mmHg  Pulse 86  Resp 20  Ht 5\' 3"  (1.6 m)  Wt 160 lb (72.576 kg)  BMI 28.35 kg/m2 Last Weight:  Wt Readings from Last 1 Encounters:  08/16/15 160 lb (72.576 kg)   [id number] mass index is 28.35 kg/(m^2).     Last Height:   Ht Readings from Last 1 Encounters:  08/16/15 5\' 3"  (1.6 m)    Physical exam:  General: The patient is awake, alert and appears not in acute distress. The patient is well groomed. Head: Normocephalic, atraumatic. Neck is supple. Mallampati 3 ,  neck circumference:14. Nasal airflow unrestricted , TMJ not evident . Retrognathia is seen.  Cardiovascular:  Regular rate and rhythm , without  murmurs or carotid bruit, and without distended neck veins. Respiratory: Lungs are clear to auscultation. Skin:  Without evidence of edema, or rash Trunk: . The patient's posture is erect   Neurologic exam :  The patient is awake and alert, oriented to place and time.   Memory subjective described as intact.   Attention span & concentration ability appears normal.  Speech is fluent,  without  dysarthria, dysphonia or aphasia.  Mood and affect are appropriate.  Cranial nerves: Pupils are equal and briskly reactive to light. Funduscopic exam with evidence of pallor and edema. Right more than left/  Extraocular movements  in vertical and horizontal planes  intact and without nystagmus.  Visual fields by finger perimetry : retricited to the right  Hearing to finger rub intact. Facial sensation intact to fine touch.Facial motor strength is symmetric and tongue and uvula move midline. Shoulder shrug was symmetrical.   Motor exam:  Normal tone, muscle bulk and symmetric strength in all extremities. The patient was bilateral FT os which help her with her foot drop. She does have a mildly decreased but grip strength bilaterally. Pinch strength is intact and range of motion for hand and fingers is normal. There is no evidence of atrophy. No evidence of tremor at rest, no cogwheeling. She wears a shoe size 6 on the left and 8 on the right.   Sensory:  Fine touch, pinprick and vibration were tested in all extremities. Proprioception tested in the upper extremities was normal.  Coordination: Rapid alternating movements in the fingers/hands was normal. Finger-to-nose maneuver  normal without evidence of ataxia, dysmetria or tremor.  Gait and station: Patient walks without assistive device and is able unassisted to climb up to the exam table. Strength within normal limits. Stance is stable and normal.  Toe and heel stand were deferred .Tandem gait as well . Turns with  4-5  Steps. Romberg testing is  negative.  Deep tendon reflexes: in the  upper extremities are symmetric, there is a trace of right patella reflex there is no patella reflex on the left , no achilles tendon reflex.  The patient was advised of the nature of the diagnosed  Gait and sleep disorder , the treatment options and risks for general a health and wellness arising from not treating the condition.  I spent more than 50 minutes of face to face time with the patient. Greater than 50% of time was spent in counseling and coordination of care. We have discussed the diagnosis and differential and I answered the patient's questions.     Assessment:  After physical and neurologic examination, review  of laboratory studies,  Personal review of imaging studies, reports of other /same  Imaging studies ,  Results of polysomnography/ neurophysiology testing and pre-existing records as far as provided in visit., my assessment is   1) Tiffany [name] main concern is to verify if the ascending numbness of her right leg is due to spina bifida or related to the knee replacement surgery in some way. I will order a nerve conduction study EMG. I am still in all that she walks at this time just with a cane and that she has no need for a scooter or wheelchair.  2)The patient has been snoring loudly , she is more fatigued and sleepy in daytime,  she wakes with a very dry mouth. She has a history of hypertension, hypercholesterolemia. She also is learned that high blood pressure has caused most of her headaches for which she suffered until about last here. I will order an attended sleep study which can be performed in the early new year.  3) stool incontinence is clearly related to spina bifida.   4) bilateral foot drop  was clearly related to spina bifida   Plan:  Treatment plan and additional workup :  EMG and NCV with Dr Jaynee Eagles or Dr. Krista Blue, female provider, please.  PSG SPLIT in 2017, capnography.  Rv in  February 2017        [facility] Kerby Hockley MD  08/16/2015   CC: Prince Solian, [place], [place] [address]

## 2015-09-19 ENCOUNTER — Encounter: Payer: Self-pay | Admitting: Internal Medicine

## 2015-09-19 ENCOUNTER — Telehealth: Payer: Self-pay | Admitting: Internal Medicine

## 2015-09-19 NOTE — Telephone Encounter (Signed)
New Message  This message is to inform you that we have made 3 consecutive attempts to contact the patient since 07/12/2015 We have also mailed a letter to the patient to inform them to call in and schedule. Although we were unsuccessful in these attempts we wanted you to be aware of our efforts. Will remove the patient from our recall list at this time    Jarold Motto [facility]

## 2015-09-21 ENCOUNTER — Ambulatory Visit (INDEPENDENT_AMBULATORY_CARE_PROVIDER_SITE_OTHER): Payer: Medicare Other | Admitting: Neurology

## 2015-09-21 ENCOUNTER — Ambulatory Visit (INDEPENDENT_AMBULATORY_CARE_PROVIDER_SITE_OTHER): Payer: Self-pay | Admitting: Neurology

## 2015-09-21 DIAGNOSIS — Q057 Lumbar spina bifida without hydrocephalus: Secondary | ICD-10-CM

## 2015-09-21 DIAGNOSIS — M21371 Foot drop, right foot: Secondary | ICD-10-CM

## 2015-09-21 DIAGNOSIS — Z0289 Encounter for other administrative examinations: Secondary | ICD-10-CM

## 2015-09-21 DIAGNOSIS — R200 Anesthesia of skin: Secondary | ICD-10-CM

## 2015-09-21 DIAGNOSIS — R519 Headache, unspecified: Secondary | ICD-10-CM

## 2015-09-21 DIAGNOSIS — M21372 Foot drop, left foot: Principal | ICD-10-CM

## 2015-09-21 DIAGNOSIS — R51 Headache: Principal | ICD-10-CM

## 2015-09-21 DIAGNOSIS — R202 Paresthesia of skin: Secondary | ICD-10-CM

## 2015-09-21 NOTE — Telephone Encounter (Signed)
Noted  

## 2015-09-21 NOTE — Progress Notes (Signed)
  [facility]    Provider:  Dr Jaynee Eagles Referring Provider: Prince Solian, MD Primary Care Physician:  Tivis Ringer, MD  HPI:  Tiffany [name] is a 65 y.o. female here as a referral for right distal foot weakness.  Summary  Nerve conduction studies were performed on the bilateral lower extremities:  The bilateral Peroneal motor nerves showed normal conductions with normal F Wave latencies The bilateral Tibial motor nerves showed normal conductions with normal F Wave latencies The bilateral Sural sensory nerve conductions were within normal limits The bilateral Superficial Peroneal sensory nerve conductions were within normal limits Bilateral H Reflexes showed normal latencies  EMG Needle study was performed on selected right lower extremity muscles:   The Gluteus Medius and Maximus, Biceps Femoris(long head and short head), Vastus Medialis, Peroneus Longus, Anterior Tibialis, Medial Gastrocnemius, Extensor Hallucis Longus and Abductor Hallucis muscles were within normal limits.  Conclusion: This is a normal study. No electrophysiologic evidence for radiculopathy or neuropathy.    Sarina Ill, MD  [facility] [address] [place], [place] [address]  Phone [phone] Fax [phone]

## 2015-09-24 NOTE — Procedures (Signed)
  [facility]    Provider:  Dr Jaynee Eagles Referring Provider: Prince Solian, MD Primary Care Physician:  Tivis Ringer, MD  HPI:  Tiffany [name] is a 65 y.o. female here as a referral for right distal foot weakness.  Summary  Nerve conduction studies were performed on the bilateral lower extremities:  The bilateral Peroneal motor nerves showed normal conductions with normal F Wave latencies The bilateral Tibial motor nerves showed normal conductions with normal F Wave latencies The bilateral Sural sensory nerve conductions were within normal limits The bilateral Superficial Peroneal sensory nerve conductions were within normal limits Bilateral H Reflexes showed normal latencies  EMG Needle study was performed on selected right lower extremity muscles:   The Gluteus Medius and Maximus, Biceps Femoris(long head and short head), Vastus Medialis, Peroneus Longus, Anterior Tibialis, Medial Gastrocnemius, Extensor Hallucis Longus and Abductor Hallucis muscles were within normal limits.  Conclusion: This is a normal study. No electrophysiologic evidence for radiculopathy or neuropathy.    Sarina Ill, MD  [facility] [address] [place], [place] [address]  Phone [phone] Fax [phone]

## 2015-09-24 NOTE — Progress Notes (Signed)
See procedure note.

## 2015-09-25 ENCOUNTER — Ambulatory Visit: Payer: Medicare Other | Admitting: Neurology

## 2015-09-25 ENCOUNTER — Telehealth: Payer: Self-pay

## 2015-09-25 NOTE — Progress Notes (Signed)
Left message asking her to call me back

## 2015-09-25 NOTE — Telephone Encounter (Signed)
Pt returned call. Appt was made for this week. She would like a call back about results. Appt has been made for Feb 6.at 10am. Please call pt at [phone]

## 2015-09-25 NOTE — Telephone Encounter (Signed)
Spoke to pt and advised her that her ncv/emg results were normal. Appt was actually not made on 2/6/ at 10 am, it was incorrectly placed. A new appt was made for 2/13 at 2:30. Pt verbalized understanding.

## 2015-09-25 NOTE — Telephone Encounter (Signed)
Called pt to discuss EMG/NCV results and make an appt with pt. No answer, left a message asking her to call me back.

## 2015-09-28 ENCOUNTER — Ambulatory Visit: Payer: Medicare Other | Admitting: Neurology

## 2015-09-29 ENCOUNTER — Other Ambulatory Visit: Payer: Self-pay | Admitting: Internal Medicine

## 2015-09-29 DIAGNOSIS — N6310 Unspecified lump in the right breast, unspecified quadrant: Secondary | ICD-10-CM

## 2015-10-09 ENCOUNTER — Ambulatory Visit (INDEPENDENT_AMBULATORY_CARE_PROVIDER_SITE_OTHER): Payer: Medicare Other | Admitting: Neurology

## 2015-10-09 ENCOUNTER — Encounter: Payer: Self-pay | Admitting: Neurology

## 2015-10-09 VITALS — BP 130/66 | HR 88 | Resp 20 | Ht 63.0 in | Wt 165.0 lb

## 2015-10-09 DIAGNOSIS — F5089 Other specified eating disorder: Secondary | ICD-10-CM | POA: Diagnosis not present

## 2015-10-09 DIAGNOSIS — Q057 Lumbar spina bifida without hydrocephalus: Secondary | ICD-10-CM | POA: Diagnosis not present

## 2015-10-09 DIAGNOSIS — R200 Anesthesia of skin: Secondary | ICD-10-CM

## 2015-10-09 NOTE — Progress Notes (Signed)
[facility]   Provider:  Larey Seat, M D  Referring Provider: Prince Solian, MD Primary Care Physician:  Tivis Ringer, MD  Chief Complaint  Patient presents with  . Follow-up    follow up on ncv/emg results, possible sleep study, rm 10, alone    HPI:  Tiffany [name] is a 65 y.o. female , seen here as a referral  from Dr. Dagmar Hait for a re- evaluation of spina bifida,   Chief complaint according to patient : Tiffany [name] has a history of speed RBC to but was never diagnosed with a tethered cord she presented after a fall in 2010 upon request of her neurosurgeon Dr. Kristeen Miss. I saw her on 01-30-09. She characterized her discomfort as pains shooting from the lowest spine upwards to shoulder and neck area and use the term "brain freeze" to describe the sensation.  The patient's history begins with meningitis and the year 1964. In the year 1972 she underwent a tonsillectomy, she had a ruptured left eardrum repair in 1979, partial hysterectomy 1989, in 1991 she underwent surgery for spina bifida at [facility] under the guidance of Dr. Tommie Raymond. 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 She had a ventral meningocele / ventral band.  During our visit in 01-30-09 she had no urinary current incontinence but had bowel incontinence already. She has microhematuria, developed kidney stones repeatedly. She had an unremarkable pregnancy. until 1990 when she developed a foot drop.  In April 2001 she underwent  Fundoplication, and several " oesophageal stretchings"  Dr. Emiliano Dyer then referred her to Dr. Erling Cruz who referred them to Dr. Quentin Cornwall and Ellene Route.  Precipitated by a fall downstairs. She was immediately referred to [facility] and Dr. Faith Rogue worked on her spinal bifida. She return to work in 2 months. She had sometimes used a walker but mostly can do with a cane, since 2002 she wore braces he uses them to prevent a foot drop. She was last hospitalized in March 2015 was a pneumonia refractory to outpatient management. She was in respiratory distress at the time, hypoxemic had bronchiectasis, multiple pulmonary nodules. She has now progressive ascending numbness to the thigh. She underwent total knee replacement of the right knee .She has noted the right foot being stuck on the foot pedal when driving.  She has been worked up by Dr. Dagmar Hait and diagnosed with a B 12 deficiency, and she has the already known spida bifida. . She has declining vision and takes injections into the right eye, has a grey haze over her eyes. Dx: Macular degeneration.   Interval history from the 13's of Fab[place] 2017. Mrs. Stahlman is here today for a follow-up visit she endorsed 3 points on her geriatric depression score, which is a negligible score fatigue severity is high at 59, Epworth sleepiness score is average at 8 points. She was originally referred by Dr. Dagmar Hait for right distal foot weakness. Dr. Jaynee Eagles my colleague specialized neuromuscular diseases, evaluated her in a EMG nerve conduction study and found no evidence of radiculopathy or neuropathy. Basically, she has fatigue for other reasons but not necessarily for a sleep related disorder. She has a history of spina bifida which would explain her right distal foot weakness  even if there is no injury to the nerve as  the nerve may have never developed. She  has numbness, too.    Review of Systems: Out of a complete 14 system review, the patient complains of only the following symptoms, and all other reviewed systems are negative.  Epworth score 8, FSS 59.   The patient endorsed hearing loss and vision loss fatigue shortness of  breath, snoring, blood in her stool and in her urine, trouble swallowing.   Social History   Social History  . Marital Status: Married    Spouse Name: N/A  . Number of Children: 2  . Years of Education: N/A   Occupational History  . Retired     Corporate treasurer   Social History Main Topics  . Smoking status: Never Smoker   . Smokeless tobacco: Never Used  . Alcohol Use: No     Comment: OCC.  . Drug Use: No  . Sexual Activity: Not on file   Other Topics Concern  . Not on file   Social History Narrative    Family History  Problem Relation Age of Onset  . Alzheimer's disease Father   . Irritable bowel syndrome Father   . Ulcers Father   . Gout Father   . Cancer Mother     ?  . Diabetes Mother   . Hypertension Mother   . Nephrolithiasis Mother   . Colon cancer Mother   . Ovarian cancer Mother   . Coronary artery disease      grandparent  . Thyroid cancer Sister   . Nephrolithiasis Brother   . Colon cancer Maternal Grandmother     Past Medical History  Diagnosis Date  . Diabetes mellitus   . Meningitis   . Hypertension   . GERD (gastroesophageal reflux disease)   . Hiatal hernia   . Gastritis   . IBS (irritable bowel syndrome)   . Depression   . Colon polyps   . Spina bifida occulta   . Insomnia   . Allergy   . Cataract     REMOVED  . Hyperlipidemia   . Osteoporosis   . Ulcer   . Dysrhythmia     RAPID HEART BEAT , CHEST TIGHTNESS  09/29/14     . Pneumonia     BRONCHIECTASIS  . Chronic cough   . Dysphagia   . Shortness of breath dyspnea     WITH EXERTION  . Bronchiectasis ([place])   . Macular disorder     MACULAR DISEASE  RT EYE   . Anxiety   . Cancer ([place])     SKIN    . Kidney stones     KIDNEY DISEASE   STAGE lll    . Kidney stones   . Arthritis     foot drop  lt foot  / toes   wears brace     . Anginal pain [facility])     Past Surgical History  Procedure Laterality Date  . Back surgery    . Abdominal hysterectomy    . Tonsillectomy    . Polypectomy     . Meniscus repair    . Fundoplication    . Lithotripsy    . Elbow surgery      left  . Cataract extraction  03/2012    bilateral  . Colonoscopy    . Knee arthroscopy      rt knee   . Eardrum      left   ruptured and repaired  .  Total knee arthroplasty Right 10/11/2014    Procedure: RIGHT TOTAL KNEE ARTHROPLASTY;  Surgeon: Mcarthur Rossetti, MD;  Location: [place];  Service: Orthopedics;  Laterality: Right;    Current Outpatient Prescriptions  Medication Sig Dispense Refill  . acetaminophen (TYLENOL) 500 MG tablet Take 1,000 mg by mouth every 8 (eight) hours as needed for mild pain or moderate pain.    . Aflibercept (EYLEA IO) Inject into the eye every 3 (three) months.    Marland Kitchen amLODipine (NORVASC) 2.5 MG tablet Take 2.5 mg by mouth daily.    . Calcium Carbonate-Vit D-Min (CALCIUM 1200 PO) Take 1 tablet by mouth daily.    . Cholecalciferol (VITAMIN D) 2000 UNITS CAPS Take 2 capsules by mouth daily.    . diazepam (VALIUM) 10 MG tablet Take 10 mg by mouth at bedtime.    . DULoxetine (CYMBALTA) 60 MG capsule Take 60 mg by mouth daily.  1  . EPIPEN 2-PAK 0.3 MG/0.3ML SOAJ injection as needed.  0  . furosemide (LASIX) 40 MG tablet Take 40 mg by mouth daily as needed for fluid or edema.     Marland Kitchen loratadine (CLARITIN) 10 MG tablet Take 10 mg by mouth daily as needed for allergies.     Marland Kitchen meclizine (ANTIVERT) 25 MG tablet Take 25 mg by mouth every 6 (six) hours as needed.  0  . metFORMIN (GLUCOPHAGE) 500 MG tablet Take 500 mg by mouth 2 (two) times daily.     . metoprolol succinate (TOPROL-XL) 50 MG 24 hr tablet Take 100 mg by mouth daily.   1  . omeprazole (PRILOSEC) 20 MG capsule Take 20 mg by mouth daily.    . ONE TOUCH ULTRA TEST test strip as needed.  1  . ONETOUCH DELICA LANCETS 99991111 MISC as directed.  1  . Polyethyl Glycol-Propyl Glycol (SYSTANE ULTRA) 0.4-0.3 % SOLN Apply 1-2 drops to eye daily.    . Probiotic Product (ALIGN) 4 MG CAPS Take 1 capsule by mouth daily as needed (IBS).      Marland Kitchen promethazine (PHENERGAN) 25 MG tablet Take 25 mg by mouth every 6 (six) hours as needed for nausea or vomiting.     . simvastatin (ZOCOR) 80 MG tablet Take 80 mg by mouth every morning.     . tobramycin (TOBREX) 0.3 % ophthalmic solution Place 1 drop into the right eye every 6 (six) hours. The day before, the day of, and the after procedure.    Marland Kitchen VITAMIN B1-B12 IJ Inject as directed every 30 (thirty) days.    . Vitamin D, Ergocalciferol, (DRISDOL) 50000 units CAPS capsule TAKE 1 CAPSULE BY MOUTH 2 TIMES A WEEK  0   No current facility-administered medications for this visit.    Allergies as of 10/09/2015 - Review Complete 10/09/2015  Allergen Reaction Noted  . Ambien [zolpidem tartrate] Other (See Comments) 09/07/2012  . Aspirin Other (See Comments) 09/07/2012  . Benicar [olmesartan medoxomil] Shortness Of Breath, Swelling, and Other (See Comments) 11/24/2011  . Ketorolac tromethamine Anaphylaxis 11/24/2011  . Latex Shortness Of Breath and Itching 11/09/2013  . Lisinopril Shortness Of Breath, Swelling, and Other (See Comments)   . Other Anaphylaxis 11/09/2013  . Reclast [zoledronic acid] Other (See Comments) 09/07/2012  . Toradol [ketorolac tromethamine] Anaphylaxis 09/07/2012  . Codeine Nausea And Vomiting   . Erythromycin Nausea And Vomiting   . Fenofibrate Nausea And Vomiting 09/07/2012  . Glucosamine forte [nutritional supplements] Diarrhea and Nausea Only 09/07/2012  . Levaquin [levofloxacin in d5w] Diarrhea and Swelling 09/07/2012  .  Percocet [oxycodone-acetaminophen] Nausea And Vomiting 09/07/2012  . Tramadol Nausea And Vomiting 09/07/2012  . Tizanidine Nausea And Vomiting and Other (See Comments) 09/07/2012    Vitals: BP 130/66 mmHg  Pulse 88  Resp 20  Ht 5\' 3"  (1.6 m)  Wt 165 lb (74.844 kg)  BMI 29.24 kg/m2 Last Weight:  Wt Readings from Last 1 Encounters:  10/09/15 165 lb (74.844 kg)   [id number] mass index is 29.24 kg/(m^2).     Last Height:   Ht Readings from  Last 1 Encounters:  10/09/15 5\' 3"  (1.6 m)    Physical exam:  General: The patient is awake, alert and appears not in acute distress. The patient is well groomed. Head: Normocephalic, atraumatic. Neck is supple. Mallampati 3 ,  neck circumference:14. Nasal airflow unrestricted , TMJ not evident . Retrognathia is seen.  Cardiovascular:  Regular rate and rhythm , without  murmurs or carotid bruit, and without distended neck veins. Respiratory: Lungs are clear to auscultation. Skin:  Without evidence of edema, or rash Trunk: . The patient's posture is erect   Neurologic exam : The patient is awake and alert, oriented to place and time.   Memory subjective described as intact.   Attention span & concentration ability appears normal.  Speech is fluent,  without  dysarthria, dysphonia or aphasia.  Mood and affect are appropriate.  Cranial nerves: Pupils are equal and briskly reactive to light. Funduscopic exam with evidence of pallor and edema. Right more than left/  Extraocular movements  in vertical and horizontal planes intact and without nystagmus.  Visual fields by finger perimetry : retricited to the right  Hearing to finger rub intact. Facial sensation intact to fine touch.Facial motor strength is symmetric and tongue and uvula move midline. Shoulder shrug was symmetrical.   Motor exam:  Normal tone, muscle bulk and symmetric strength in all extremities. The patient was bilateral FT os which help her with her foot drop. She does have a mildly decreased but grip strength bilaterally. Pinch strength is intact and range of motion for hand and fingers is normal. There is no evidence of atrophy. No evidence of tremor at rest, no cogwheeling. She wears a shoe size 6 on the left and 8 on the right.   Sensory:  Fine touch, pinprick and vibration were tested in all extremities. Proprioception tested in the upper extremities was normal.  Coordination: Rapid alternating movements in the  fingers/hands was normal. Finger-to-nose maneuver  normal without evidence of ataxia, dysmetria or tremor.  Gait and station: Patient walks with a cane for  assistive device and is able unassisted to climb up to the exam table. Strength within normal limits. Stance is stable and normal.  Toe and heel stand were deferred and Tandem gait as well. Turns with  4-5  Steps. Romberg testing is negative.  Deep tendon reflexes: in the  upper extremities are symmetric, there is a trace of right patella reflex there is no patella reflex on the left , no achilles tendon reflex.  The patient was advised of the nature of the diagnosed  Gait and sleep disorder , the treatment options and risks for general a health and wellness arising from not treating the condition.  I spent more than 20 minutes of face to face time with the patient.  Greater than 50% of time was spent in counseling and coordination of care. We have discussed the diagnosis and differential and I answered the patient's questions.     Assessment:  After physical  and neurologic examination, review of laboratory studies,  Personal review of imaging studies, reports of other /same  Imaging studies ,  Results of polysomnography/ neurophysiology testing and pre-existing records as far as provided in visit., my assessment is   1) Mrs. Dixons main concern is to verify if the ascending numbness of her right leg is due to spina bifida or related to the knee replacement surgery in some way. The nerve conduction study and  EMG were unremarkable - dr Jaynee Eagles. . I am still in awe that she walks at this time -just with a cane- and that she has no need for a scooter or wheelchair.  2)The patient has been snoring loudly , she is more fatigued and sleepy in daytime,  she wakes with a very dry mouth. She has a history of hypertension, hypercholesterolemia. She also is learned that high blood pressure has caused most of her headaches for which she suffered until about  last here. I will order an attended sleep study which can be performed in the early new year.  3)Burning mouth and tongue- improved on B 12 supplementation. May need klonopin.   3) stool incontinence is clearly related to spina bifida.   4) bilateral foot drop is related to spina bifida   Plan:  Treatment plan and additional workup :   PSG SPLIT in 2017,  Rv in March/ April  after  SPLIT.  PICa and burning mouth- check iron and check B12 vitamin.  Take Vit D.   Asencion Partridge Lenord Fralix MD  10/09/2015   CC: Prince Solian, [address], [place] [address]

## 2015-10-10 ENCOUNTER — Telehealth: Payer: Self-pay

## 2015-10-10 MED ORDER — NA FERRIC GLUC CPLX IN SUCROSE 12.5 MG/ML IV SOLN: 62.5000 mg | Freq: Once | INTRAVENOUS | Status: DC

## 2015-10-10 NOTE — Addendum Note (Signed)
Addended by: Larey Seat on: 10/10/2015 11:18 AM   Modules accepted: Orders

## 2015-10-10 NOTE — Telephone Encounter (Signed)
Spoke to pt and advised her that her ferritin was very low, with low iron saturation, with high residual TIBC. This means that the pt is iron deficient and Dr. Brett Fairy recommends that she come in for an IV iron infusion. Pt is agreeable to this and knows that Otila Kluver, RN will call her to schedule in infusion in about one week. RX for iron infusion given to [employer], Therapist, sports.  Pt is asking what Dr. Brett Fairy thinks is causing pt's low iron. I advised pt that I would ask Dr. Brett Fairy and I would call the pt back with Dr. Edwena Felty advice. Pt verbalized understanding.

## 2015-10-10 NOTE — Telephone Encounter (Signed)
-----   Message from Larey Seat, MD sent at 10/10/2015 11:14 AM EST ----- Very low ferritin - under 50, and low iron saturation, high residual TIBC- this patient is IRON DEFICIENT > please schedule for IV iron as oral iron has not affected the baseline CD .

## 2015-10-11 NOTE — Telephone Encounter (Signed)
This encounter was created in error - please disregard.

## 2015-10-11 NOTE — Telephone Encounter (Signed)
Spoke to pt and advised her that Dr. 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 Pt verbalized understanding. I advised her that Otila Kluver, our infusion nurse should be calling her in the next week to receive her iron infusion. Pt verbalized understanding.

## 2015-10-11 NOTE — Telephone Encounter (Signed)
See above message

## 2015-10-11 NOTE — Telephone Encounter (Signed)
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

## 2015-10-12 LAB — IRON AND TIBC
IRON SATURATION: 19 % (ref 15–55)
Iron: 73 ug/dL (ref 27–139)
Total Iron Binding Capacity: 389 ug/dL (ref 250–450)
UIBC: 316 ug/dL (ref 118–369)

## 2015-10-12 LAB — FERRITIN: Ferritin: 23 ng/mL (ref 15–150)

## 2015-10-12 LAB — METHYLMALONIC ACID, SERUM: METHYLMALONIC ACID: 261 nmol/L (ref 0–378)

## 2015-10-18 ENCOUNTER — Telehealth: Payer: Self-pay

## 2015-10-18 NOTE — Telephone Encounter (Signed)
-----   Message from Lester [place], RN sent at 10/18/2015  5:18 PM EST -----   ----- Message -----    From: Lester Perry, RN    Sent: 10/18/2015   5:09 PM      To: Mellody Life Cox    ----- Message -----    From: Larey Seat, MD    Sent: 10/18/2015   5:00 PM      To: Lester [place], RN  Normal B 12

## 2015-10-18 NOTE — Telephone Encounter (Signed)
Patient is aware of results.

## 2015-11-03 ENCOUNTER — Ambulatory Visit
Admission: RE | Admit: 2015-11-03 | Discharge: 2015-11-03 | Disposition: A | Discharge status: Home / Self Care | Payer: Medicare Other | Source: Ambulatory Visit | Attending: Internal Medicine | Admitting: Internal Medicine

## 2015-11-03 DIAGNOSIS — N6310 Unspecified lump in the right breast, unspecified quadrant: Secondary | ICD-10-CM

## 2015-11-13 ENCOUNTER — Ambulatory Visit: Payer: Medicare Other | Admitting: Pulmonary Disease

## 2015-11-14 ENCOUNTER — Telehealth: Payer: Self-pay

## 2015-11-14 MED ORDER — NA FERRIC GLUC CPLX IN SUCROSE 12.5 MG/ML IV SOLN: 62.5000 mg | Freq: Once | INTRAVENOUS | Status: DC

## 2015-11-14 NOTE — Telephone Encounter (Signed)
Pt called Tiffany [name], infusion RN asking for another venofer infusion. She has already been infused 10/17/2015 and 10/25/2015. Insurance has approved it.  Is it ok for another venofer infusion? If approved, I will let Tiffany [name] in infusion know.

## 2015-11-14 NOTE — Telephone Encounter (Signed)
i will order a third infusion for this chronically iron deficient patient with RLS.

## 2015-11-27 ENCOUNTER — Ambulatory Visit (INDEPENDENT_AMBULATORY_CARE_PROVIDER_SITE_OTHER): Payer: Medicare Other | Admitting: Neurology

## 2015-11-27 DIAGNOSIS — G4710 Hypersomnia, unspecified: Secondary | ICD-10-CM

## 2015-11-27 DIAGNOSIS — F5089 Other specified eating disorder: Secondary | ICD-10-CM

## 2015-11-27 NOTE — Sleep Study (Signed)
Please see the scanned sleep study interpretation located in the Procedure tab within the Chart Review section. 

## 2015-11-30 ENCOUNTER — Telehealth: Payer: Self-pay

## 2015-11-30 DIAGNOSIS — G4733 Obstructive sleep apnea (adult) (pediatric): Secondary | ICD-10-CM

## 2015-11-30 NOTE — Telephone Encounter (Signed)
Spoke to pt. She wanted to know what REM dependent sleep apnea is. I explained that it was apnea in her deepest stage of sleep, the one where she dreams. Pt verbalized understanding. I encouraged pt to call back with further questions or concerns. She says that she has been scheduled for her cpap titration study on May 20th, 2017.

## 2015-11-30 NOTE — Telephone Encounter (Signed)
Tiffany [name], this pt said you called her but she couldn't talk to you and request that you call her back, she has questions.  L

## 2015-11-30 NOTE — Telephone Encounter (Signed)
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 Alternative therapies such as oral appliance or ENT procedure do not work as well for REM dependent apnea. Pt is willing to proceed with cpap titration study. I advised weight loss and positional therapy. I advised pt to avoid sedative hypnotics which may worsen sleep apnea, alcohol, and tobacco. Pt verbalized understanding. Pt knows that the sleep lab will call her to schedule the cpap titration.

## 2015-12-15 ENCOUNTER — Encounter: Payer: Self-pay | Admitting: Internal Medicine

## 2015-12-15 ENCOUNTER — Ambulatory Visit (INDEPENDENT_AMBULATORY_CARE_PROVIDER_SITE_OTHER): Payer: Medicare Other | Admitting: Internal Medicine

## 2015-12-15 VITALS — BP 140/74 | HR 66 | Ht 63.0 in | Wt 163.0 lb

## 2015-12-15 DIAGNOSIS — R000 Tachycardia, unspecified: Secondary | ICD-10-CM | POA: Diagnosis not present

## 2015-12-15 DIAGNOSIS — E785 Hyperlipidemia, unspecified: Secondary | ICD-10-CM

## 2015-12-15 MED ORDER — ROSUVASTATIN CALCIUM 20 MG PO TABS: 20.0000 mg | ORAL_TABLET | Freq: Every day | ORAL | Status: DC

## 2015-12-15 NOTE — Patient Instructions (Addendum)
Your physician has recommended you make the following change in your medication:  1.) STOP ZOCOR (simvastatin) 2.) START CRESTOR 20 MG ONCE A DAY  Your physician recommends that you return for lab work in: 2 months (lipids)  Your physician has recommended that you wear an event monitor. Event monitors are medical devices that record the heart's electrical activity. Doctors most often [place] these monitors to diagnose arrhythmias. Arrhythmias are problems with the speed or rhythm of the heartbeat. The monitor is a small, portable device. You can wear one while you do your normal daily activities. This is usually used to diagnose what is causing palpitations/syncope (passing out).  Your physician wants you to follow-up in: 6 months with Dr. Harrington Challenger.  You will receive a reminder letter in the mail two months in advance. If you don't receive a letter, please call our office to schedule the follow-up appointment.

## 2015-12-15 NOTE — Progress Notes (Signed)
Cardiology Office Note   Date:  12/15/2015   ID:  Tiffany [name], DOB 12-18-1950, MRN [id number]  PCP:  Tivis Ringer, MD  Cardiologist:   Dorris Carnes, MD   Pt presents for f/u pf palpitations    History of Present Illness: Tiffany [name] is a 65 y.o. female with a history of Palpitations  And CP  I saw her in August   Mhyoview was normal    Pt had an episode of heart racing about 140  Lasted about 30 min  Had felt good that day  Active    Started racing  Went to lay down    Breathing isnt bad  Has allergies   No CP      Outpatient Prescriptions Prior to Visit  Medication Sig Dispense Refill  . acetaminophen (TYLENOL) 500 MG tablet Take 1,000 mg by mouth every 8 (eight) hours as needed for mild pain or moderate pain.    . Aflibercept (EYLEA IO) Inject into the eye every 3 (three) months.    Marland Kitchen amLODipine (NORVASC) 2.5 MG tablet Take 2.5 mg by mouth daily.    . Calcium Carbonate-Vit D-Min (CALCIUM 1200 PO) Take 1 tablet by mouth daily.    . Cholecalciferol (VITAMIN D) 2000 UNITS CAPS Take 2 capsules by mouth daily.    . diazepam (VALIUM) 10 MG tablet Take 10 mg by mouth at bedtime.    . DULoxetine (CYMBALTA) 60 MG capsule Take 60 mg by mouth daily.  1  . EPIPEN 2-PAK 0.3 MG/0.3ML SOAJ injection as needed.  0  . ferric gluconate (NULECIT) 12.5 MG/ML injection Inject 5 mLs (62.5 mg total) into the vein once. 5 mL 0  . furosemide (LASIX) 40 MG tablet Take 40 mg by mouth daily as needed for fluid or edema.     Marland Kitchen loratadine (CLARITIN) 10 MG tablet Take 10 mg by mouth daily as needed for allergies.     Marland Kitchen meclizine (ANTIVERT) 25 MG tablet Take 25 mg by mouth every 6 (six) hours as needed.  0  . metFORMIN (GLUCOPHAGE) 500 MG tablet Take 500 mg by mouth 2 (two) times daily.     . metoprolol succinate (TOPROL-XL) 50 MG 24 hr tablet Take 100 mg by mouth daily.   1  . ONE TOUCH ULTRA TEST test strip as needed.  1  . ONETOUCH DELICA LANCETS 99991111 MISC as directed.  1  . Polyethyl  Glycol-Propyl Glycol (SYSTANE ULTRA) 0.4-0.3 % SOLN Apply 1-2 drops to eye daily.    . Probiotic Product (ALIGN) 4 MG CAPS Take 1 capsule by mouth daily as needed (IBS).     Marland Kitchen promethazine (PHENERGAN) 25 MG tablet Take 25 mg by mouth every 6 (six) hours as needed for nausea or vomiting.     . simvastatin (ZOCOR) 80 MG tablet Take 80 mg by mouth every morning.     . tobramycin (TOBREX) 0.3 % ophthalmic solution Place 1 drop into the right eye every 6 (six) hours. The day before, the day of, and the after procedure.    Marland Kitchen VITAMIN B1-B12 IJ Inject as directed every 30 (thirty) days.    . Vitamin D, Ergocalciferol, (DRISDOL) 50000 units CAPS capsule TAKE 1 CAPSULE BY MOUTH 2 TIMES A WEEK  0  . omeprazole (PRILOSEC) 20 MG capsule Take 20 mg by mouth daily. Reported on 12/15/2015     No facility-administered medications prior to visit.     Allergies:   Ambien; Aspirin; Benicar; Codeine; Erythromycin; Ketorolac; Ketorolac  tromethamine; Latex; Lisinopril; Olmesartan; Other; Reclast; Toradol; Zolpidem; Fenofibrate; Glucosamine; Glucosamine forte; Levaquin; Levofloxacin; Percocet; Tramadol; and Tizanidine   Past Medical History  Diagnosis Date  . Diabetes mellitus   . Meningitis   . Hypertension   . GERD (gastroesophageal reflux disease)   . Hiatal hernia   . Gastritis   . IBS (irritable bowel syndrome)   . Depression   . Colon polyps   . Spina bifida occulta   . Insomnia   . Allergy   . Cataract     REMOVED  . Hyperlipidemia   . Osteoporosis   . Ulcer   . Dysrhythmia     RAPID HEART BEAT , CHEST TIGHTNESS  09/29/14     . Pneumonia     BRONCHIECTASIS  . Chronic cough   . Dysphagia   . Shortness of breath dyspnea     WITH EXERTION  . Bronchiectasis ([place])   . Macular disorder     MACULAR DISEASE  RT EYE   . Anxiety   . Cancer ([place])     SKIN    . Kidney stones     KIDNEY DISEASE   STAGE lll    . Kidney stones   . Arthritis     foot drop  lt foot  / toes   wears brace     . Anginal  pain [facility])     Past Surgical History  Procedure Laterality Date  . Back surgery    . Abdominal hysterectomy    . Tonsillectomy    . Polypectomy    . Meniscus repair    . Fundoplication    . Lithotripsy    . Elbow surgery      left  . Cataract extraction  03/2012    bilateral  . Colonoscopy    . Knee arthroscopy      rt knee   . Eardrum      left   ruptured and repaired  . Total knee arthroplasty Right 10/11/2014    Procedure: RIGHT TOTAL KNEE ARTHROPLASTY;  Surgeon: Mcarthur Rossetti, MD;  Location: [facility];  Service: Orthopedics;  Laterality: Right;     Social History:  The patient  reports that she has never smoked. She has never used smokeless tobacco. She reports that she does not drink alcohol or use illicit drugs.   Family History:  The patient's family history includes Alzheimer's disease in her father; Cancer in her mother; Colon cancer in her maternal grandmother and mother; Diabetes in her mother; Gout in her father; Hypertension in her mother; Irritable bowel syndrome in her father; Nephrolithiasis in her brother and mother; Ovarian cancer in her mother; Thyroid cancer in her sister; Ulcers in her father.    ROS:  Please see the history of present illness. All other systems are reviewed and  Negative to the above problem except as noted.    PHYSICAL EXAM: VS:  BP 140/74 mmHg  Pulse 66  Ht 5\' 3"  (1.6 m)  Wt 163 lb (73.936 kg)  BMI 28.88 kg/m2  GEN: Well nourished, well developed, in no acute distress HEENT: normal Neck: no JVD, carotid bruits, or masses Cardiac: RRR; no murmurs, rubs, or gallops,no edema  Respiratory:  clear to auscultation bilaterally, normal work of breathing GI: soft, nontender, nondistended, + BS  No hepatomegaly  MS: no deformity Moving all extremities   Skin: warm and dry, no rash Neuro:  Strength and sensation are intact Psych: euthymic mood, full affect   EKG:  EKG  is ordered today.  SR 65 bpm     Lipid Panel    Component  Value Date/Time   CHOL 191 05/05/2015 1038   TRIG 271.0* 05/05/2015 1038   HDL 43.70 05/05/2015 1038   CHOLHDL 4 05/05/2015 1038   VLDL 54.2* 05/05/2015 1038   LDLCALC * 10/23/2010 0740    106        Total Cholesterol/HDL:CHD Risk Coronary Heart Disease Risk Table                     Men   Women  1/2 Average Risk   3.4   3.3  Average Risk       5.0   4.4  2 X Average Risk   9.6   7.1  3 X Average Risk  23.4   11.0        Use the calculated Patient Ratio above and the CHD Risk Table to determine the patient's CHD Risk.        ATP III CLASSIFICATION (LDL):  <100     mg/dL   Optimal  100-129  mg/dL   Near or Above                    Optimal  130-159  mg/dL   Borderline  160-189  mg/dL   High  >190     mg/dL   Very High   LDLDIRECT 94.0 05/05/2015 1038      Wt Readings from Last 3 Encounters:  12/15/15 163 lb (73.936 kg)  10/09/15 165 lb (74.844 kg)  08/16/15 160 lb (72.576 kg)      ASSESSMENT AND PLAN:  1  Palpitations   Will set up for event monitor  ? afib   2.  HL  Stop Simvistatin  Start Crestor 20  F/U lpids in 2 months  Watch carbs    3  CADNo symptoms to suggest angina    4  CP  Denies       Signed, Dorris Carnes, MD  12/15/2015 9:01 AM    [address], [place], [place]  [address] Phone: [phone]; Fax: [phone]

## 2015-12-18 ENCOUNTER — Ambulatory Visit (INDEPENDENT_AMBULATORY_CARE_PROVIDER_SITE_OTHER): Payer: Medicare Other | Admitting: Neurology

## 2015-12-18 DIAGNOSIS — G4733 Obstructive sleep apnea (adult) (pediatric): Secondary | ICD-10-CM | POA: Diagnosis not present

## 2015-12-19 NOTE — Progress Notes (Deleted)
Subjective:    Patient ID: Tiffany [name] is a 65 y.o. female.  HPI {Common ambulatory SmartLinks:[id number]}  Review of Systems  Objective:  Neurologic Exam  Physical Exam  Assessment:   ***  Plan:   ***

## 2015-12-21 ENCOUNTER — Telehealth: Payer: Self-pay | Admitting: *Deleted

## 2015-12-21 ENCOUNTER — Ambulatory Visit (INDEPENDENT_AMBULATORY_CARE_PROVIDER_SITE_OTHER): Payer: Medicare Other

## 2015-12-21 DIAGNOSIS — E785 Hyperlipidemia, unspecified: Secondary | ICD-10-CM

## 2015-12-21 DIAGNOSIS — R000 Tachycardia, unspecified: Secondary | ICD-10-CM | POA: Diagnosis not present

## 2015-12-21 NOTE — Telephone Encounter (Signed)
Patient requests a replacement prescription be called in for Crestor.  It was too expensive,( $145/3 months), not affordable on her budget.

## 2015-12-21 NOTE — Telephone Encounter (Signed)
Crestor should be generic But I would recomm Lipitor 40 mg   F/U lipid panel with AST in 8 wks

## 2015-12-22 ENCOUNTER — Telehealth: Payer: Self-pay | Admitting: Cardiology

## 2015-12-22 MED ORDER — ATORVASTATIN CALCIUM 40 MG PO TABS: 40.0000 mg | ORAL_TABLET | Freq: Every day | ORAL | Status: AC

## 2015-12-22 NOTE — Addendum Note (Signed)
Addended by: Rodman Key on: 12/22/2015 12:52 PM   Modules accepted: Orders, Medications

## 2015-12-22 NOTE — Telephone Encounter (Signed)
I was called by the monitoring service that the patient had sent an alert that she had syncope.  However, they monitor only demonstrated normal sinus rhythm.  I tried to call the patient at a home and cell number to verify the syncope.  However, she did not answer either phone call.  I left a voicemail message to call the answering service to get in contact with me.

## 2015-12-22 NOTE — Telephone Encounter (Signed)
Left detailed message on home voice mail (according to [place] note it is ok) dc'd crestor, sent lipitor prescription to [facility]. Already has appointment for labs on 02/14/16

## 2015-12-25 ENCOUNTER — Telehealth: Payer: Self-pay | Admitting: *Deleted

## 2015-12-25 ENCOUNTER — Telehealth: Payer: Self-pay | Admitting: Cardiology

## 2015-12-25 NOTE — Telephone Encounter (Signed)
Received monitor tracings dated 12/22/15 6:54PM (CT)-  Comments on this report:   Pt symptom:passed out Report analysis: sinus rhythm  Comments:CRAT/CCT Tech reviewed                     PATIENT DID NOT PASS OUT, rapid heart beat, short of breath,light headed, home, standing.  Dr Harrington Challenger reviewed this monitor tracing.  Sinus rhythm, heart rate 88.  I attempted to contact pt at both phone numbers listed for pt, left message on voice mail at both numbers to call me back to discuss with patient.

## 2015-12-25 NOTE — Telephone Encounter (Signed)
I spoke with patient. Pt states she was capping strawberries at the time of this tracing. Pt states she did not pass out, that she pushed the wrong button on the monitor but she was unable to correct it after she pushed it. Pt states she did feel a little bit of a fast heart rate/lightheadedness but she did not pass out.   Pt advised Dr Harrington Challenger did not have any new recommendations at this time, continue monitor.

## 2015-12-25 NOTE — Telephone Encounter (Signed)
Pt calling back regarding alert-she pushed the wrong button and verified that she did not have a syncopal episode-

## 2015-12-26 ENCOUNTER — Telehealth: Payer: Self-pay

## 2015-12-26 DIAGNOSIS — G4733 Obstructive sleep apnea (adult) (pediatric): Secondary | ICD-10-CM

## 2015-12-26 NOTE — Telephone Encounter (Signed)
Spoke to pt and advised her that Dr. Brett Fairy reviewed her sleep study results and recommends that she start a cpap. Pt is agreeable. Pt states she felt "very refreshed" after using cpap. I advised her that PLMS with associated sleep disruption were a main cause of arousals for the pt. If the PLMS do not resolve with PAP therapy, Dr. Brett Fairy would consider treating the PLMS. Pt verbalized understanding.  I advised pt that an order for cpap will be sent to a DME and they would call her to get the cpap set up. Pt verbalized understanding. I advised pt to avoid caffeine containing beverages and chocolate. Will send to [facility]. A follow up appt was made for 03/07/2016 at 10:00. Pt verbalized understanding.

## 2016-01-30 ENCOUNTER — Ambulatory Visit: Payer: Medicare Other | Admitting: Neurology

## 2016-02-14 ENCOUNTER — Other Ambulatory Visit: Payer: Medicare Other

## 2016-02-29 ENCOUNTER — Other Ambulatory Visit (INDEPENDENT_AMBULATORY_CARE_PROVIDER_SITE_OTHER): Payer: Medicare Other

## 2016-02-29 DIAGNOSIS — E785 Hyperlipidemia, unspecified: Secondary | ICD-10-CM

## 2016-02-29 DIAGNOSIS — R000 Tachycardia, unspecified: Secondary | ICD-10-CM

## 2016-02-29 LAB — LIPID PANEL
Cholesterol: 165 mg/dL (ref 125–200)
HDL: 42 mg/dL — AB (ref >=46)
LDL Cholesterol: 67 mg/dL (ref <130)
TRIGLYCERIDES: 279 mg/dL — AB (ref <150)
Total CHOL/HDL Ratio: 3.9 Ratio (ref <=5.0)
VLDL: 56 mg/dL — ABNORMAL HIGH (ref <30)

## 2016-03-01 ENCOUNTER — Encounter: Payer: Self-pay | Admitting: Pulmonary Disease

## 2016-03-01 ENCOUNTER — Telehealth: Payer: Self-pay | Admitting: *Deleted

## 2016-03-01 NOTE — Telephone Encounter (Signed)
Pt notified of lab results and recommendations by phone with verbal understanding.

## 2016-03-07 ENCOUNTER — Encounter: Payer: Self-pay | Admitting: Neurology

## 2016-03-07 ENCOUNTER — Ambulatory Visit (INDEPENDENT_AMBULATORY_CARE_PROVIDER_SITE_OTHER): Payer: Medicare Other | Admitting: Neurology

## 2016-03-07 VITALS — BP 124/82 | HR 74 | Resp 20 | Ht 63.0 in | Wt 160.0 lb

## 2016-03-07 DIAGNOSIS — F5089 Other specified eating disorder: Secondary | ICD-10-CM | POA: Diagnosis not present

## 2016-03-07 DIAGNOSIS — D509 Iron deficiency anemia, unspecified: Secondary | ICD-10-CM | POA: Diagnosis not present

## 2016-03-07 DIAGNOSIS — G4733 Obstructive sleep apnea (adult) (pediatric): Secondary | ICD-10-CM | POA: Diagnosis not present

## 2016-03-07 DIAGNOSIS — Z9989 Dependence on other enabling machines and devices: Secondary | ICD-10-CM

## 2016-03-07 DIAGNOSIS — Q057 Lumbar spina bifida without hydrocephalus: Secondary | ICD-10-CM | POA: Diagnosis not present

## 2016-03-07 DIAGNOSIS — R0683 Snoring: Secondary | ICD-10-CM

## 2016-03-07 NOTE — Progress Notes (Signed)
[facility]   Provider:  Larey Seat, M D  Referring Provider: Prince Solian, MD Primary Care Physician:  Tivis Ringer, MD  No chief complaint on file.   HPI:  Tiffany [name] is a 65 y.o. female , seen here as a referral  from Dr. Dagmar Hait for a re- evaluation of spina bifida,   Chief complaint according to patient : Tiffany [name] has a history of speed RBC to but was never diagnosed with a tethered cord she presented after a fall in 2010 upon request of her neurosurgeon Dr. Kristeen Miss. I saw her on 01-30-09. She characterized her discomfort as pains shooting from the lowest spine upwards to shoulder and neck area and use the term "brain freeze" to describe the sensation.  The patient's history begins with meningitis and the year 1964. In the year 1972 she underwent a tonsillectomy, she had a ruptured left eardrum repair in 1979, partial hysterectomy 1989, in 1991 she underwent surgery for spina bifida at [facility] under the guidance of Dr. Tommie Raymond. 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 She had a ventral meningocele / ventral band.  During our visit in 01-30-09 she had no urinary current incontinence but had bowel incontinence already. She has microhematuria, developed kidney stones repeatedly. She had an unremarkable pregnancy. until 1990 when she developed a foot drop.  In April 2001 she underwent  Fundoplication, and several " oesophageal stretchings"  Dr. Emiliano Dyer then referred her to Dr. Erling Cruz who referred them to Dr. Quentin Cornwall and Ellene Route. Precipitated by a fall downstairs. She was immediately referred to [facility] and Dr. Faith Rogue worked on her  spinal bifida. She return to work in 2 months. She had sometimes used a walker but mostly can do with a cane, since 2002 she wore braces he uses them to prevent a foot drop. She was last hospitalized in March 2015 was a pneumonia refractory to outpatient management. She was in respiratory distress at the time, hypoxemic had bronchiectasis, multiple pulmonary nodules. She has now progressive ascending numbness to the thigh. She underwent total knee replacement of the right knee .She has noted the right foot being stuck on the foot pedal when driving.  She has been worked up by Dr. Dagmar Hait and diagnosed with a B 12 deficiency, and she has the already known spida bifida. . She has declining vision and takes injections into the right eye, has a grey haze over her eyes. Dx: Macular degeneration.   Interval history from the 2-13- 2017. Tiffany [name] is here today for a follow-up visit she endorsed 3 points on her geriatric depression score, which is a negligible score fatigue severity is high at 59, Epworth sleepiness score is average at 8 points. She was originally referred by Dr. Dagmar Hait for right distal foot weakness. Dr. Jaynee Eagles my colleague specialized neuromuscular diseases, evaluated her in a EMG nerve conduction study and found no evidence of radiculopathy or neuropathy. Basically, she has fatigue for other reasons but not necessarily for a sleep related disorder. She has a history of spina bifida which would explain her right distal foot weakness even if there is no injury to the nerve as  the nerve may have never developed. She  has numbness,  too. Burning tongue.  Plan: PSG SPLIT in 2017,  Rv in March/ April  after  SPLIT. PICA and burning mouth- check iron and check B12 vitamin. Take Vit D.  Interval history from 03/07/2016. Tiffany [name] has been successfully treated with CPAP after a sleep study on 11/27/2015 revealed apnea with an AHI during REM sleep of 42.2 and in supine sleep of 10.8 the overall AHI was only  8.7 but was felt to contribute to the patient's excessive daytime fatigue rather than  sleepiness.  Given her extensive past medical history I decided on a CPAP titration which followed on 12/18/2015, the patient is now using an auto CPAP between 4 and 14 cm water and a small nasal pillow interface. We also able today to see the compliance which is 100% - the patient is using the machine on average 7 hours and 41 minutes at night, the minimum pressure is for the maximum pressure of 14 cm water with full-time EPR of 3 cm.  Residual AHI is 0.5 the 91st percentile pressure is only 6.5.  Based on this result I would like for her to continue using the CPAP, it has been influential on reducing her fatigue but she had endorsed at her fatigue severity score of 59 during her last visits. In addition her burning mouth symptoms have been improved on gargling with a solution of warm water and several drops of to [place], this capsaicin mouthwash relieved the pain within 10-12 days. Her pica is much improved with iron deficiency treatment and she received a round of infusions of iron. It has given her energy, and Dr. Dagmar Hait  is following her anemia levels.    Review of Systems:Out of a complete 14 system review, the patient complains of only the following symptoms, and all other reviewed systems are negative. 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 Epworth score 1 from 8, FSS is now 32 from  59 !.  Depression zero.   Social History   Social History  . Marital Status: Married    Spouse Name: N/A  . Number of Children: 2  . Years of Education: N/A   Occupational History  . Retired     Corporate treasurer   Social History Main Topics  . Smoking status: Never Smoker   . Smokeless tobacco: Never Used  . Alcohol Use: No     Comment: OCC.  . Drug Use: No  . Sexual Activity: Not on file   Other Topics Concern  . Not on file    Social History Narrative    Family History  Problem Relation Age of Onset  . Alzheimer's disease Father   . Irritable bowel syndrome Father   . Ulcers Father   . Gout Father   . Cancer Mother     ?  . Diabetes Mother   . Hypertension Mother   . Nephrolithiasis Mother   . Colon cancer Mother   . Ovarian cancer Mother   . Coronary artery disease      grandparent  . Thyroid cancer Sister   . Nephrolithiasis Brother   . Colon cancer Maternal Grandmother     Past Medical History  Diagnosis Date  . Diabetes mellitus   . Meningitis   . Hypertension   . GERD (gastroesophageal reflux disease)   . Hiatal hernia   . Gastritis   . IBS (irritable bowel syndrome)   . Depression   . Colon  polyps   . Spina bifida occulta   . Insomnia   . Allergy   . Cataract     REMOVED  . Hyperlipidemia   . Osteoporosis   . Ulcer   . Dysrhythmia     RAPID HEART BEAT , CHEST TIGHTNESS  09/29/14     . Pneumonia     BRONCHIECTASIS  . Chronic cough   . Dysphagia   . Shortness of breath dyspnea     WITH EXERTION  . Bronchiectasis ([place])   . Macular disorder     MACULAR DISEASE  RT EYE   . Anxiety   . Cancer ([place])     SKIN    . Kidney stones     KIDNEY DISEASE   STAGE lll    . Kidney stones   . Arthritis     foot drop  lt foot  / toes   wears brace     . Anginal pain [facility])     Past Surgical History  Procedure Laterality Date  . Back surgery    . Abdominal hysterectomy    . Tonsillectomy    . Polypectomy    . Meniscus repair    . Fundoplication    . Lithotripsy    . Elbow surgery      left  . Cataract extraction  03/2012    bilateral  . Colonoscopy    . Knee arthroscopy      rt knee   . Eardrum      left   ruptured and repaired  . Total knee arthroplasty Right 10/11/2014    Procedure: RIGHT TOTAL KNEE ARTHROPLASTY;  Surgeon: Mcarthur Rossetti, MD;  Location: [place];  Service: Orthopedics;  Laterality: Right;    Current Outpatient Prescriptions  Medication Sig  Dispense Refill  . acetaminophen (TYLENOL) 500 MG tablet Take 1,000 mg by mouth every 8 (eight) hours as needed for mild pain or moderate pain.    . Aflibercept (EYLEA IO) Inject into the eye every 3 (three) months.    Marland Kitchen amLODipine (NORVASC) 2.5 MG tablet Take 2.5 mg by mouth daily.    Marland Kitchen atorvastatin (LIPITOR) 40 MG tablet Take 1 tablet (40 mg total) by mouth daily. 90 tablet 3  . Calcium Carbonate-Vit D-Min (CALCIUM 1200 PO) Take 1 tablet by mouth daily.    . Cholecalciferol (VITAMIN D) 2000 UNITS CAPS Take 2 capsules by mouth daily.    . diazepam (VALIUM) 10 MG tablet Take 10 mg by mouth at bedtime.    . DULoxetine (CYMBALTA) 60 MG capsule Take 60 mg by mouth daily.  1  . EPIPEN 2-PAK 0.3 MG/0.3ML SOAJ injection as needed.  0  . ferric gluconate (NULECIT) 12.5 MG/ML injection Inject 5 mLs (62.5 mg total) into the vein once. 5 mL 0  . Loperamide HCl (IMODIUM PO) Take by mouth as needed.    . loratadine (CLARITIN) 10 MG tablet Take 10 mg by mouth daily as needed for allergies.     Marland Kitchen meclizine (ANTIVERT) 25 MG tablet Take 25 mg by mouth every 6 (six) hours as needed.  0  . metFORMIN (GLUCOPHAGE) 500 MG tablet Take 500 mg by mouth 2 (two) times daily.     . metoprolol succinate (TOPROL-XL) 50 MG 24 hr tablet Take 100 mg by mouth daily.   1  . ONE TOUCH ULTRA TEST test strip as needed.  1  . ONETOUCH DELICA LANCETS 99991111 MISC as directed.  1  . Polyethyl Glycol-Propyl Glycol (SYSTANE ULTRA)  0.4-0.3 % SOLN Apply 1-2 drops to eye daily.    . Probiotic Product (ALIGN) 4 MG CAPS Take 1 capsule by mouth daily as needed (IBS).     Marland Kitchen promethazine (PHENERGAN) 25 MG tablet Take 25 mg by mouth every 6 (six) hours as needed for nausea or vomiting.     . tobramycin (TOBREX) 0.3 % ophthalmic solution Place 1 drop into the right eye every 6 (six) hours. The day before, the day of, and the after procedure.    Marland Kitchen VITAMIN B1-B12 IJ Inject as directed every 30 (thirty) days.    . Vitamin D, Ergocalciferol, (DRISDOL)  50000 units CAPS capsule TAKE 1 CAPSULE BY MOUTH 2 TIMES A WEEK  0  . furosemide (LASIX) 40 MG tablet Take 40 mg by mouth daily as needed for fluid or edema. Reported on 03/07/2016     No current facility-administered medications for this visit.    Allergies as of 03/07/2016 - Review Complete 12/15/2015  Allergen Reaction Noted  . Ambien [zolpidem tartrate] Other (See Comments) 09/07/2012  . Aspirin Other (See Comments) 09/07/2012  . Benicar [olmesartan medoxomil] Shortness Of Breath, Swelling, and Other (See Comments) 11/24/2011  . Codeine Nausea And Vomiting   . Erythromycin Nausea And Vomiting   . Ketorolac Anaphylaxis 12/15/2015  . Ketorolac tromethamine Anaphylaxis 11/24/2011  . Latex Shortness Of Breath and Itching 11/09/2013  . Lisinopril Shortness Of Breath, Swelling, and Other (See Comments)   . Olmesartan Other (See Comments), Shortness Of Breath, and Swelling 12/15/2015  . Other Anaphylaxis 11/09/2013  . Reclast [zoledronic acid] Other (See Comments) 09/07/2012  . Toradol [ketorolac tromethamine] Anaphylaxis 09/07/2012  . Zolpidem Other (See Comments) 12/15/2015  . Fenofibrate Nausea And Vomiting 09/07/2012  . Glucosamine Diarrhea and Nausea Only 12/15/2015  . Glucosamine forte [nutritional supplements] Diarrhea and Nausea Only 09/07/2012  . Levaquin [levofloxacin in d5w] Diarrhea and Swelling 09/07/2012  . Levofloxacin Diarrhea and Swelling 12/15/2015  . Percocet [oxycodone-acetaminophen] Nausea And Vomiting 09/07/2012  . Tramadol Nausea And Vomiting 09/07/2012  . Tizanidine Nausea And Vomiting and Other (See Comments) 09/07/2012    Vitals: BP 124/82 mmHg  Pulse 74  Resp 20  Ht 5\' 3"  (1.6 m)  Wt 160 lb (72.576 kg)  BMI 28.35 kg/m2 Last Weight:  Wt Readings from Last 1 Encounters:  03/07/16 160 lb (72.576 kg)   [id number] mass index is 28.35 kg/(m^2).     Last Height:   Ht Readings from Last 1 Encounters:  03/07/16 5\' 3"  (1.6 m)    Physical exam:  General: The  patient is awake, alert and appears not in acute distress. The patient is well groomed. Head: Normocephalic, atraumatic. Neck is supple. Mallampati 3 ,  neck circumference:14. Nasal airflow unrestricted , TMJ not evident . Retrognathia is seen.  Cardiovascular:  Regular rate and rhythm , without  murmurs or carotid bruit, and without distended neck veins. Respiratory: Lungs are clear to auscultation. Skin:  Without evidence of edema, or rash Trunk: . The patient's posture is erect   Neurologic exam : The patient is awake and alert, oriented to place and time.   Memory subjective described as intact.   Attention span & concentration ability appears normal.  Speech is fluent,  without  dysarthria, dysphonia or aphasia.  Mood and affect are appropriate.  Cranial nerves: Pupils are equal and briskly reactive to light. Funduscopic exam with evidence of pallor and edema. Right more than left/  Extraocular movements  in vertical and horizontal planes intact and without nystagmus.  Visual fields by finger perimetry : retricited to the right  Hearing to finger rub intact. Facial sensation intact to fine touch.Facial motor strength is symmetric and tongue and uvula move midline. Shoulder shrug was symmetrical.   Motor exam:  Normal tone, muscle bulk and symmetric strength in all extremities. The patient was bilateral FT os which help her with her foot drop. She does have a mildly decreased but grip strength bilaterally. Pinch strength is intact and range of motion for hand and fingers is normal.  There is no evidence of atrophy.  No evidence of tremor at rest, no cogwheeling. She wears a shoe size 6 on the left and size 8 on the right.   (Sensory:  Fine touch, pinprick and vibration were tested in upper  extremities. Proprioception tested in the upper extremities was normal. Coordination: Rapid alternating movements in the fingers/hands was normal. Finger-to-nose maneuver  normal without evidence of  ataxia, dysmetria or tremor. Gait and station: Patient walks with a cane for  assistive device and is able unassisted to climb up to the exam table. Strength within normal limits. Stance is stable and normal.  Toe and heel stand were deferred and Tandem gait as well. Turns with  4-5  Steps. Romberg testing is negative. Deep tendon reflexes: in the  upper extremities are symmetric, there is a trace of right patella reflex there is no patella reflex on the left , no achilles tendon reflex.)  The patient was advised of the nature of the diagnosed  Gait and sleep disorder , the treatment options and risks for general a health and wellness arising from not treating the condition.  I spent more than 20 minutes of face to face time with the patient.  Greater than 50% of time was spent in counseling and coordination of care. We have discussed the diagnosis and differential and I answered the patient's questions.     Assessment:  After physical and neurologic examination, review of laboratory studies,  Personal review of imaging studies, reports of other /same  Imaging studies ,  Results of polysomnography/ neurophysiology testing and pre-existing records as far as provided in visit., my assessment is   1) Tiffany [name] main concern is to verify if the ascending numbness of her right leg is due to spina bifida or related to the knee replacement surgery in some way. The nerve conduction study and  EMG were unremarkable - Dr Jaynee Eagles. . I am still in awe that she walks at this time -just with a cane- and that she has no need for a scooter or wheelchair.  2)The patient has been snoring loudly , she is more fatigued and sleepy in daytime,  she wakes with a very dry mouth. She has a history of hypertension, hypercholesterolemia. She also is learned that high blood pressure has caused most of her headaches for which she suffered until about last here. We found mild OSA and loud snoring confirmed and treated with auto  CPAP- she feels much better, less fatigued.   3)Burning mouth and tongue- improved on B 12 supplementation, capsacin  Did not  need klonopin.   3) stool incontinence is clearly related to spina bifida.  Diarrhea, status post fundoplication,  Strong family history of IBS- diarrhea. Has strictures of the oesophagus. Return to Dr Henrene Pastor, GI.    4) bilateral foot drop is related to spina bifida   Plan:  Treatment plan and additional workup :  Stay on CPAP, supplement iron, elimination diet to identify triggers  for diarrhea.  I do think that the sphincter tone is clearly related to her spina bifida and a neurologic concern, but this cannot explain the diarrhea. Rv for CPAP in 6 month.      Asencion Partridge Quatisha Zylka MD  03/07/2016   CC: Prince Solian, [place], [place] [address]

## 2016-03-13 ENCOUNTER — Ambulatory Visit: Payer: Medicare Other | Admitting: Gastroenterology

## 2016-03-29 ENCOUNTER — Other Ambulatory Visit (INDEPENDENT_AMBULATORY_CARE_PROVIDER_SITE_OTHER): Payer: Medicare Other

## 2016-03-29 ENCOUNTER — Ambulatory Visit (INDEPENDENT_AMBULATORY_CARE_PROVIDER_SITE_OTHER): Payer: Medicare Other | Admitting: Gastroenterology

## 2016-03-29 ENCOUNTER — Encounter: Payer: Self-pay | Admitting: Gastroenterology

## 2016-03-29 VITALS — BP 120/78 | Ht 63.0 in | Wt 165.0 lb

## 2016-03-29 DIAGNOSIS — R197 Diarrhea, unspecified: Secondary | ICD-10-CM

## 2016-03-29 DIAGNOSIS — R110 Nausea: Secondary | ICD-10-CM | POA: Diagnosis not present

## 2016-03-29 LAB — IGA: IgA: 124 mg/dL (ref 68–378)

## 2016-03-29 MED ORDER — RIFAXIMIN 550 MG PO TABS: 550.0000 mg | ORAL_TABLET | Freq: Three times a day (TID) | ORAL | 0 refills | Status: DC

## 2016-03-29 NOTE — Patient Instructions (Signed)
Your physician has requested that you go to the basement for lab work before leaving today   We have sent the following medications to [facility] for you.

## 2016-04-01 ENCOUNTER — Telehealth: Payer: Self-pay | Admitting: Gastroenterology

## 2016-04-01 LAB — TISSUE TRANSGLUTAMINASE, IGA: Tissue Transglutaminase Ab, IgA: 1 U/mL (ref <4)

## 2016-04-02 ENCOUNTER — Encounter: Payer: Self-pay | Admitting: *Deleted

## 2016-04-03 ENCOUNTER — Telehealth: Payer: Self-pay | Admitting: *Deleted

## 2016-04-03 DIAGNOSIS — R197 Diarrhea, unspecified: Secondary | ICD-10-CM | POA: Insufficient documentation

## 2016-04-03 DIAGNOSIS — R110 Nausea: Secondary | ICD-10-CM | POA: Insufficient documentation

## 2016-04-03 NOTE — Telephone Encounter (Signed)
Received fax from [facility], this Xifaxan Tab 550 mg was approved through 04-16-2016.

## 2016-04-03 NOTE — Telephone Encounter (Signed)
Called the patient to advise that her co-pay would be $514.00 . She said she can not afford that. I told her I spoke with Alonza Bogus PA and Janett Billow will let me know what she decides to use as an alternative.

## 2016-04-03 NOTE — Telephone Encounter (Signed)
I called and LM for the patient on her cell phone.  . She then she called me from her home phone.  I advised her that per Janett Billow, the Celiac tests came back negative. We are still waiting for the pancreatic elastase stool test results.  Once they are back we will call and let her know if Janett Billow wants to send Flagyl ,an antibiotic.  She thanked me for calling.

## 2016-04-03 NOTE — Progress Notes (Addendum)
03/29/2016 EUNIQUE DENARO [id number] [birth year]   HISTORY OF PRESENT ILLNESS:  This is a 65 year old female who is known to Dr. Henrene Pastor.  Has long-standing issues with diarrhea intermittently "all my life".  She comes in today with complaints of diarrhea, not daily but more frequent than previous.  Also complains of nausea (just of note, EGD in 2013 was performed in part for complaints of nausea).  Is taking a daily probiotic (Align), and Imodium, which does help temporarily when she takes it.  Had a reaction to Lomotil (hallucinations).  Using Zofran and phenergan intermittently for nausea.  Stool study by PCP in July was negative for Cdiff, ova/parasites, and enteric pathogens by culture.  CBC WNL's.  Last CT scan abdomen and pelvis with contrast 08/2012 normal at that time.  Last colonoscopy was 08/2012 at which time she was found to have 3 polyps that were removed and also had sigmoid diverticulosis.  Polyps were TA's and repeat recommended in 5 years.  EGD 11/2011 was normal except for changes s/p fundoplication.    Past Medical History:  Diagnosis Date  . Allergy   . Anginal pain ([place])   . Anxiety   . Arthritis    foot drop  lt foot  / toes   wears brace     . Bronchiectasis ([place])   . Cancer ([place])    SKIN    . Cataract    REMOVED  . Chronic cough   . Colon polyps   . Depression   . Diabetes mellitus   . Dysphagia   . Dysrhythmia    RAPID HEART BEAT , CHEST TIGHTNESS  09/29/14     . Gastritis   . GERD (gastroesophageal reflux disease)   . Hiatal hernia   . Hyperlipidemia   . Hypertension   . IBS (irritable bowel syndrome)   . Insomnia   . Kidney stones    KIDNEY DISEASE   STAGE lll    . Kidney stones   . Macular disorder    MACULAR DISEASE  RT EYE   . Meningitis   . Osteoporosis   . Pneumonia    BRONCHIECTASIS  . Shortness of breath dyspnea    WITH EXERTION  . Spina bifida occulta   . Ulcer    Past Surgical History:  Procedure Laterality Date  . ABDOMINAL  HYSTERECTOMY    . BACK SURGERY    . CATARACT EXTRACTION  03/2012   bilateral  . COLONOSCOPY    . eardrum     left   ruptured and repaired  . ELBOW SURGERY     left  . fundoplication    . KNEE ARTHROSCOPY     rt knee   . LITHOTRIPSY    . MENISCUS REPAIR    . POLYPECTOMY    . TONSILLECTOMY    . TOTAL KNEE ARTHROPLASTY Right 10/11/2014   Procedure: RIGHT TOTAL KNEE ARTHROPLASTY;  Surgeon: Mcarthur Rossetti, MD;  Location: [facility];  Service: Orthopedics;  Laterality: Right;    reports that she has never smoked. She has never used smokeless tobacco. She reports that she does not drink alcohol or use drugs. family history includes Alzheimer's disease in her father; Cancer in her mother; Colon cancer in her maternal grandmother and mother; Diabetes in her mother; Gout in her father; Hypertension in her mother; Irritable bowel syndrome in her father; Nephrolithiasis in her brother and mother; Ovarian cancer in her mother; Thyroid cancer in her sister; Ulcers in  her father. Allergies  Allergen Reactions  . Ambien [Zolpidem Tartrate] Other (See Comments)    Nightmares and insomnia  . Aspirin Other (See Comments)    GI bleed Other Reaction: OTHER REACTION GI bleed  . Benicar [Olmesartan Medoxomil] Shortness Of Breath, Swelling and Other (See Comments)    Swelling of throat  . Codeine Nausea And Vomiting  . Erythromycin Nausea And Vomiting  . Ketorolac Anaphylaxis  . Ketorolac Tromethamine Anaphylaxis  . Latex Shortness Of Breath and Itching  . Lisinopril Shortness Of Breath, Swelling and Other (See Comments)     Swelling of throat  Swelling of throat  . Olmesartan Other (See Comments), Shortness Of Breath and Swelling    Swelling of throat  . Other Anaphylaxis    Peaches and mangoes, tree nuts, tomatoes, shellfish, Cat dandor, green peas Peaches and mangoes, tree nuts, tomatoes, shellfish, Cat dandor, green peas  . Reclast [Zoledronic Acid] Other (See Comments)    Ocular reaction,  toxic Ocular reaction, toxic  . Toradol [Ketorolac Tromethamine] Anaphylaxis  . Zolpidem Other (See Comments)    Nightmares and insomnia  . Fenofibrate Nausea And Vomiting  . Glucosamine Diarrhea and Nausea Only  . Glucosamine Forte [Nutritional Supplements] Diarrhea and Nausea Only  . Levaquin [Levofloxacin In D5w] Diarrhea and Swelling  . Levofloxacin Diarrhea and Swelling  . Percocet [Oxycodone-Acetaminophen] Nausea And Vomiting  . Tramadol Nausea And Vomiting  . Lomotil [Diphenoxylate-Atropine]     Hallucinations   . Tizanidine Nausea And Vomiting and Other (See Comments)    unknown unknown      Outpatient Encounter Prescriptions as of 03/29/2016  Medication Sig  . acetaminophen (TYLENOL) 500 MG tablet Take 1,000 mg by mouth every 8 (eight) hours as needed for mild pain or moderate pain.  . Aflibercept (EYLEA IO) Inject into the eye every 3 (three) months.  Marland Kitchen amLODipine (NORVASC) 2.5 MG tablet Take 2.5 mg by mouth daily.  Marland Kitchen atorvastatin (LIPITOR) 40 MG tablet Take 1 tablet (40 mg total) by mouth daily.  . Calcium Carbonate-Vit D-Min (CALCIUM 1200 PO) Take 1 tablet by mouth daily.  . Cholecalciferol (VITAMIN D) 2000 UNITS CAPS Take 2 capsules by mouth daily.  . diazepam (VALIUM) 10 MG tablet Take 10 mg by mouth at bedtime.  . DULoxetine (CYMBALTA) 60 MG capsule Take 60 mg by mouth daily.  Marland Kitchen EPIPEN 2-PAK 0.3 MG/0.3ML SOAJ injection as needed.  . ferric gluconate (NULECIT) 12.5 MG/ML injection Inject 5 mLs (62.5 mg total) into the vein once.  . furosemide (LASIX) 40 MG tablet Take 40 mg by mouth daily as needed for fluid or edema. Reported on 03/07/2016  . Loperamide HCl (IMODIUM PO) Take by mouth as needed.  . loratadine (CLARITIN) 10 MG tablet Take 10 mg by mouth daily as needed for allergies.   Marland Kitchen meclizine (ANTIVERT) 25 MG tablet Take 25 mg by mouth every 6 (six) hours as needed.  . metFORMIN (GLUCOPHAGE) 500 MG tablet Take 500 mg by mouth 2 (two) times daily.   . metoprolol  succinate (TOPROL-XL) 50 MG 24 hr tablet Take 100 mg by mouth daily.   . ONE TOUCH ULTRA TEST test strip as needed.  Glory Rosebush DELICA LANCETS [id number] MISC as directed.  Vladimir Faster Glycol-Propyl Glycol (SYSTANE ULTRA) 0.4-0.3 % SOLN Apply 1-2 drops to eye daily.  . Probiotic Product (ALIGN) 4 MG CAPS Take 1 capsule by mouth daily as needed (IBS).   Marland Kitchen promethazine (PHENERGAN) 25 MG tablet Take 25 mg by mouth every 6 (six)  hours as needed for nausea or vomiting.   . tobramycin (TOBREX) 0.3 % ophthalmic solution Place 1 drop into the right eye every 6 (six) hours. The day before, the day of, and the after procedure.  Marland Kitchen VITAMIN B1-B12 IJ Inject as directed every 30 (thirty) days.  . Vitamin D, Ergocalciferol, (DRISDOL) 50000 units CAPS capsule TAKE 1 CAPSULE BY MOUTH 2 TIMES A WEEK  . rifaximin (XIFAXAN) 550 MG TABS tablet Take 1 tablet (550 mg total) by mouth 3 (three) times daily.  . [DISCONTINUED] rifaximin (XIFAXAN) 550 MG TABS tablet Take 1 tablet (550 mg total) by mouth 3 (three) times daily.  . [DISCONTINUED] rifaximin (XIFAXAN) 550 MG TABS tablet Take 1 tablet (550 mg total) by mouth 3 (three) times daily.   No facility-administered encounter medications on file as of 03/29/2016.     REVIEW OF SYSTEMS  : All other systems reviewed and negative except where noted in the History of Present Illness.   PHYSICAL EXAM: BP 120/78   Ht 5\' 3"  (1.6 m)   Wt 165 lb (74.8 kg)   BMI 29.23 kg/m  General:  Well developed white female in no acute distress Head: Normocephalic and atraumatic Eyes:  Sclerae anicteric, conjunctiva pink. Ears: Normal auditory acuity Lungs: Clear throughout to auscultation Heart: Regular rate and rhythm Abdomen: Soft, non-distended.  Normal bowel sounds.  Non-tender. Musculoskeletal: Symmetrical with no gross deformities  Skin: No lesions on visible extremities Extremities: No edema  Neurological: Alert oriented x 4, grossly non-focal Psychological:  Alert and  cooperative. Normal mood and affect  ASSESSMENT AND PLAN: -65 year old female who has history of IBS and diarrhea intermittently for years goes to the office with complaints of worsening diarrhea and nausea (also chronic intermittent issue).  ? If she has IBS/SIBO vs celiac disease vs pancreatic insufficiency.  Will check celiac labs and will check stool pancreatic/fecal elastase.  In the interim we will try to get Xifaxan approved and treat her with a course of that to see if it helps.  CC:  Prince Solian, MD

## 2016-04-03 NOTE — Telephone Encounter (Signed)
See note from 04-03-2016.

## 2016-04-04 LAB — PANCREATIC ELASTASE, FECAL

## 2016-04-08 ENCOUNTER — Telehealth: Payer: Self-pay | Admitting: Gastroenterology

## 2016-04-08 NOTE — Progress Notes (Signed)
Agree with initial assessment and plans as outlined 

## 2016-04-11 MED ORDER — METRONIDAZOLE 250 MG PO TABS: 250.0000 mg | ORAL_TABLET | Freq: Three times a day (TID) | ORAL | 0 refills | Status: DC

## 2016-04-11 NOTE — Telephone Encounter (Signed)
-----   Message from Loralie Champagne, PA-C sent at 04/10/2016  4:32 PM EDT ----- Regarding: RE: Labs on [employer] Yes, stool study is negative.  Please prescribe flagyl 250 mg TID for 7 days as long as she is not allergic.  I do not know if she drinks ETOH but no ETOH while taking the medication for for 3 days following.  Thank you,  Jess  ----- Message ----- From: Tonette Bihari, CMA Sent: 04/09/2016   2:40 PM To: Loralie Champagne, PA-C Subject: Labs on [facility], You were waiting on the lab results for  Pancreatic Elastase, Fecal test. As far as I can tell her results looks normal .   She can't afford the co-pay of $550.00 for the Xifaxan. Did you want to perscibe Flagyl?  Pam

## 2016-04-11 NOTE — Telephone Encounter (Signed)
Spoke to patient and informed her to take Flagyl. Informed her that stool cultures were negative and she states she isn't sure if she needs Flagyl. Patient denies drinking ETOH.

## 2016-05-14 ENCOUNTER — Ambulatory Visit (INDEPENDENT_AMBULATORY_CARE_PROVIDER_SITE_OTHER): Payer: Medicare Other | Admitting: Pulmonary Disease

## 2016-05-14 ENCOUNTER — Encounter: Payer: Self-pay | Admitting: Pulmonary Disease

## 2016-05-14 DIAGNOSIS — J479 Bronchiectasis, uncomplicated: Secondary | ICD-10-CM

## 2016-05-14 DIAGNOSIS — R1314 Dysphagia, pharyngoesophageal phase: Secondary | ICD-10-CM | POA: Diagnosis not present

## 2016-05-14 DIAGNOSIS — Z23 Encounter for immunization: Secondary | ICD-10-CM

## 2016-05-14 NOTE — Patient Instructions (Signed)
Flu shot Call us for early signs of bronchitis-yellow-green phlegm or wheezing  CPAP follow-up with Dr. Beacher May

## 2016-05-14 NOTE — Progress Notes (Signed)
   Subjective:    Patient ID: Tiffany [name], female    DOB: 02-08-1951, 65 y.o.   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 Follow-up    wearing a cpap machine now that is followed by Dr. Karna Christmas; breathing is doing much better.  having fatigue.     Since her last visit, she was diagnosed with OSA and placed on CPAP by neurology and is being managed by them She had PLM's and low ferritin and this is being treated by iron infusion  She has not had any wheezing or productive cough or dyspnea  She also had low B 12 levels and burning tongue-she was asked to take hot sauce by Dr. Constance Holster and this improved her symptoms    Significant tests/ events  12/2013 Spirometry showed no evidence of airway obstruction normal lung function  10/2013 CT angio chest  was negative for pulmonary embolism, showed extensive bronchiectatic changes bilaterally, with nodularity and interstitial prominence in both lower lobes. There was an 8 mm nodule in the right middle lobe which was the largest. There was a faint infiltrate in the left upper lobe.  CT 06/2014 Complete resolution of multifocal peribronchovascular micronodularity noted on the prior study. The small 3 mm left lower lobe nodule noted on the prior examination has completely resolved. The larger nodule in the right lung  much smaller     Review of Systems neg for any significant sore  throat, dysphagia, itching, sneezing, nasal congestion or excess/ purulent secretions, fever, chills, sweats, unintended wt loss, pleuritic or exertional cp, hempoptysis, orthopnea pnd or change in chronic leg swelling. Also denies presyncope, palpitations, heartburn, abdominal pain, nausea, vomiting, diarrhea or change in bowel or urinary habits, dysuria,hematuria, rash, arthralgias, visual complaints, headache, numbness weakness or ataxia.     Objective:   Physical Exam  Gen. Pleasant,  in no distress ENT - no lesions, no post nasal drip Neck: No JVD, no thyromegaly, no carotid bruits Lungs: no use of accessory muscles, no dullness to percussion, decreased without rales or rhonchi  Cardiovascular: Rhythm regular, heart sounds  normal, no murmurs or gallops, no peripheral edema Musculoskeletal: No deformities, no cyanosis or clubbing , no tremors       Assessment & Plan:

## 2016-05-14 NOTE — Assessment & Plan Note (Signed)
Feel that bilateral infiltrates were related to episode of aspiration, probably due to achalasia

## 2016-05-14 NOTE — Assessment & Plan Note (Signed)
stable  Flu shot Call us for early signs of bronchitis-yellow-green phlegm or wheezing

## 2016-06-03 ENCOUNTER — Encounter: Payer: Self-pay | Admitting: Internal Medicine

## 2016-06-03 ENCOUNTER — Ambulatory Visit (INDEPENDENT_AMBULATORY_CARE_PROVIDER_SITE_OTHER): Payer: Medicare Other | Admitting: Internal Medicine

## 2016-06-03 VITALS — BP 130/88 | HR 72 | Ht 63.0 in | Wt 172.8 lb

## 2016-06-03 DIAGNOSIS — R1084 Generalized abdominal pain: Secondary | ICD-10-CM | POA: Diagnosis not present

## 2016-06-03 DIAGNOSIS — R197 Diarrhea, unspecified: Secondary | ICD-10-CM | POA: Diagnosis not present

## 2016-06-03 DIAGNOSIS — D508 Other iron deficiency anemias: Secondary | ICD-10-CM

## 2016-06-03 DIAGNOSIS — K625 Hemorrhage of anus and rectum: Secondary | ICD-10-CM

## 2016-06-03 DIAGNOSIS — R140 Abdominal distension (gaseous): Secondary | ICD-10-CM

## 2016-06-03 DIAGNOSIS — K219 Gastro-esophageal reflux disease without esophagitis: Secondary | ICD-10-CM

## 2016-06-03 MED ORDER — NA SULFATE-K SULFATE-MG SULF 17.5-3.13-1.6 GM/177ML PO SOLN
1.0000 | Freq: Once | ORAL | 0 refills | Status: AC
Start: 2016-06-03 — End: 2016-06-03

## 2016-06-03 NOTE — Progress Notes (Signed)
HISTORY OF PRESENT ILLNESS:  Tiffany [name] is a 65 y.o. female with multiple medical problems including spina bifida with occult spinal dysraphism (status post T12-L5 laminectomies), hypertension, hyperlipidemia, depression, kidney stones, and renal insufficiency. She has been followed in this office for GERD status post fundoplication, irritable bowel syndrome, and chronic functional abdominal complaints. I last saw the patient January 2014. She is undergone several colonoscopies. Most recently 2014 with 3 polyps removed. She tells me that she has been off her omeprazole at the direction of her neurologist because of iron deficiency. Fortunately, no active reflux symptoms. Her chief complaints today are diarrhea with mucus and bleeding as well as abdominal distention. She was seen by the GI physician assistant 2 months ago for similar issues. She could not afford Xifaxan. Denies using metronidazole despite it being on her medication list. Does use probiotic. Imodium 2-3 days per week seems to help some. He reports stopping metformin for one month made no difference. She also complains of abdominal distention with bloating. Feels like she is pregnant. Her nephrologist thought EGD in addition to colonoscopy would be a good idea  REVIEW OF SYSTEMS:  All non-GI ROS negative except for back pain, depression, allergies, arthritis  Past Medical History:  Diagnosis Date  . Allergy   . Anginal pain ([place])   . Anxiety   . Arthritis    foot drop  lt foot  / toes   wears brace     . Bronchiectasis ([place])   . Cancer ([place])    SKIN    . Cataract    REMOVED  . Chronic cough   . Colon polyps   . Depression   . Diabetes mellitus   . Dysphagia   . Dysrhythmia    RAPID HEART BEAT , CHEST TIGHTNESS  09/29/14     . Gastritis   . GERD (gastroesophageal reflux disease)   . Hiatal hernia   . Hyperlipidemia   . Hypertension   . IBS (irritable bowel syndrome)   . Insomnia   . Kidney stones    KIDNEY DISEASE    STAGE lll    . Kidney stones   . Macular disorder    MACULAR DISEASE  RT EYE   . Meningitis   . Osteoporosis   . Pneumonia    BRONCHIECTASIS  . Shortness of breath dyspnea    WITH EXERTION  . Spina bifida occulta   . Ulcer [facility])     Past Surgical History:  Procedure Laterality Date  . ABDOMINAL HYSTERECTOMY    . BACK SURGERY    . CATARACT EXTRACTION  03/2012   bilateral  . COLONOSCOPY    . eardrum     left   ruptured and repaired  . ELBOW SURGERY     left  . fundoplication    . KNEE ARTHROSCOPY     rt knee   . LITHOTRIPSY    . MENISCUS REPAIR    . POLYPECTOMY    . TONSILLECTOMY    . TOTAL KNEE ARTHROPLASTY Right 10/11/2014   Procedure: RIGHT TOTAL KNEE ARTHROPLASTY;  Surgeon: Mcarthur Rossetti, MD;  Location: [facility];  Service: Orthopedics;  Laterality: Right;    Social History Tiffany [name]  reports that she has never smoked. She has never used smokeless tobacco. She reports that she does not drink alcohol or use drugs.  family history includes Alzheimer's disease in her father; Cancer in her mother; Colon cancer in her maternal grandmother and mother; Diabetes in her mother;  Gout in her father; Hypertension in her mother; Irritable bowel syndrome in her father; Nephrolithiasis in her brother and mother; Ovarian cancer in her mother; Thyroid cancer in her sister; Ulcers in her father.  Allergies  Allergen Reactions  . Ambien [Zolpidem Tartrate] Other (See Comments)    Nightmares and insomnia  . Aspirin Other (See Comments)    GI bleed Other Reaction: OTHER REACTION GI bleed  . Benicar [Olmesartan Medoxomil] Shortness Of Breath, Swelling and Other (See Comments)    Swelling of throat  . Codeine Nausea And Vomiting  . Erythromycin Nausea And Vomiting  . Ketorolac Anaphylaxis  . Ketorolac Tromethamine Anaphylaxis  . Latex Shortness Of Breath and Itching  . Lisinopril Shortness Of Breath, Swelling and Other (See Comments)     Swelling of throat  Swelling of  throat  . Olmesartan Other (See Comments), Shortness Of Breath and Swelling    Swelling of throat  . Other Anaphylaxis    Peaches and mangoes, tree nuts, tomatoes, shellfish, Cat dandor, green peas Peaches and mangoes, tree nuts, tomatoes, shellfish, Cat dandor, green peas  . Reclast [Zoledronic Acid] Other (See Comments)    Ocular reaction, toxic Ocular reaction, toxic  . Toradol [Ketorolac Tromethamine] Anaphylaxis  . Zolpidem Other (See Comments)    Nightmares and insomnia  . Fenofibrate Nausea And Vomiting  . Glucosamine Diarrhea and Nausea Only  . Glucosamine Forte [Nutritional Supplements] Diarrhea and Nausea Only  . Levaquin [Levofloxacin In D5w] Diarrhea and Swelling  . Levofloxacin Diarrhea and Swelling  . Percocet [Oxycodone-Acetaminophen] Nausea And Vomiting  . Tramadol Nausea And Vomiting  . Lomotil [Diphenoxylate-Atropine]     Hallucinations   . Tizanidine Nausea And Vomiting and Other (See Comments)    unknown unknown       PHYSICAL EXAMINATION: Vital signs: BP 130/88 (BP Location: Left Arm, Patient Position: Sitting, Cuff Size: Normal)   Pulse 72   Ht 5\' 3"  (1.6 m)   Wt 172 lb 12.8 oz (78.4 kg)   BMI 30.61 kg/m   Constitutional:Pleasant, generally well-appearing, no acute distress but difficult movement secondary to back pain Psychiatric: alert and oriented x3, cooperative Eyes: extraocular movements intact, anicteric, conjunctiva pink Mouth: oral pharynx moist, no lesions Neck: supple no lymphadenopathy Cardiovascular: heart regular rate and rhythm, no murmur Lungs: clear to auscultation bilaterally Abdomen: soft, nontender, nondistended, no obvious ascites, no peritoneal signs, normal bowel sounds, no organomegaly Rectal: Deferred until colonoscopy Extremities: no clubbing cyanosis or lower extremity edema bilaterally Skin: no lesions on visible extremities Neuro: No focal deficits. Cranial nerves intact  ASSESSMENT:  1. Chronic diarrhea with  occasional blood and mucus. Rule out endoscopic colitis. Rule out microscopic colitis 2. History of adenomatous colon polyps. Previous examinations 2006, 2009, and 2014 with multiple polyps 3. History of iron deficiency 4. History of GERD status post fundoplication. Now off PPI for 4 months without negative effects 5. Reported iron deficiency 6. Abdominal fullness or bloating discomfort  7. Multiple medical problems    PLAN:  1. Schedule colonoscopy to evaluate chronic diarrhea, bleeding, and iron deficiency.The nature of the procedure, as well as the risks, benefits, and alternatives were carefully and thoroughly reviewed with the patient. Ample time for discussion and questions allowed. The patient understood, was satisfied, and agreed to proceed. 2. Schedule upper endoscopy to evaluate abdominal complaints and iron deficiency.The nature of the procedure, as well as the risks, benefits, and alternatives were carefully and thoroughly reviewed with the patient. Ample time for discussion and questions allowed. The patient  understood, was satisfied, and agreed to proceed. 3. Abdominal ultrasound to evaluate abdominal fullness/discomfort 4. Hold diabetic medications the day of the procedure to avoid hypoglycemia

## 2016-06-03 NOTE — Patient Instructions (Signed)
You have been scheduled for an abdominal ultrasound at [facility] (1st floor of hospital) on 06/17/2016 at 9:30am. Please arrive 15 minutes prior to your appointment for registration. Make certain not to have anything to eat or drink after midnight prior to your appointment. Should you need to reschedule your appointment, please contact radiology at [phone]. This test typically takes about 30 minutes to perform.   You have been scheduled for an endoscopy and colonoscopy. Please follow the written instructions given to you at your visit today. Please pick up your prep supplies at the pharmacy within the next 1-3 days. If you use inhalers (even only as needed), please bring them with you on the day of your procedure. Your physician has requested that you go to www.startemmi.com and enter the access code given to you at your visit today. This web site gives a general overview about your procedure. However, you should still follow specific instructions given to you by our office regarding your preparation for the procedure.

## 2016-06-06 ENCOUNTER — Encounter: Payer: Self-pay | Admitting: Internal Medicine

## 2016-06-17 ENCOUNTER — Ambulatory Visit (HOSPITAL_COMMUNITY)
Admission: RE | Admit: 2016-06-17 | Discharge: 2016-06-17 | Disposition: A | Discharge status: Home / Self Care | Payer: Medicare Other | Source: Ambulatory Visit | Attending: Internal Medicine | Admitting: Internal Medicine

## 2016-06-17 DIAGNOSIS — R1084 Generalized abdominal pain: Secondary | ICD-10-CM | POA: Diagnosis not present

## 2016-06-17 DIAGNOSIS — K625 Hemorrhage of anus and rectum: Secondary | ICD-10-CM | POA: Insufficient documentation

## 2016-06-17 DIAGNOSIS — R932 Abnormal findings on diagnostic imaging of liver and biliary tract: Secondary | ICD-10-CM | POA: Diagnosis not present

## 2016-06-17 DIAGNOSIS — R140 Abdominal distension (gaseous): Secondary | ICD-10-CM | POA: Insufficient documentation

## 2016-06-17 DIAGNOSIS — R197 Diarrhea, unspecified: Secondary | ICD-10-CM | POA: Diagnosis not present

## 2016-06-20 ENCOUNTER — Ambulatory Visit (AMBULATORY_SURGERY_CENTER): Payer: Medicare Other | Admitting: Internal Medicine

## 2016-06-20 ENCOUNTER — Encounter: Payer: Self-pay | Admitting: Internal Medicine

## 2016-06-20 VITALS — BP 141/77 | HR 66 | Temp 97.8°F | Resp 22 | Ht 63.0 in | Wt 172.0 lb

## 2016-06-20 DIAGNOSIS — Z86010 Personal history of colonic polyps: Secondary | ICD-10-CM

## 2016-06-20 DIAGNOSIS — R1084 Generalized abdominal pain: Secondary | ICD-10-CM

## 2016-06-20 DIAGNOSIS — K635 Polyp of colon: Secondary | ICD-10-CM | POA: Diagnosis not present

## 2016-06-20 DIAGNOSIS — R197 Diarrhea, unspecified: Secondary | ICD-10-CM

## 2016-06-20 DIAGNOSIS — K625 Hemorrhage of anus and rectum: Secondary | ICD-10-CM

## 2016-06-20 DIAGNOSIS — D125 Benign neoplasm of sigmoid colon: Secondary | ICD-10-CM

## 2016-06-20 DIAGNOSIS — D508 Other iron deficiency anemias: Secondary | ICD-10-CM | POA: Diagnosis not present

## 2016-06-20 DIAGNOSIS — K219 Gastro-esophageal reflux disease without esophagitis: Secondary | ICD-10-CM

## 2016-06-20 LAB — GLUCOSE, CAPILLARY
Glucose-Capillary: 120 mg/dL — ABNORMAL HIGH (ref 65–99)
Glucose-Capillary: 88 mg/dL (ref 65–99)

## 2016-06-20 MED ORDER — METRONIDAZOLE 250 MG PO TABS: 250.0000 mg | ORAL_TABLET | Freq: Three times a day (TID) | ORAL | 1 refills | Status: DC

## 2016-06-20 MED ORDER — SODIUM CHLORIDE 0.9 % IV SOLN: 500.0000 mL | INTRAVENOUS | Status: DC

## 2016-06-20 NOTE — Progress Notes (Signed)
Report to PACU, RN, vss, BBS= Clear.  

## 2016-06-20 NOTE — Progress Notes (Signed)
Called to room to assist during endoscopic procedure.  Patient ID and intended procedure confirmed with present staff. Received instructions for my participation in the procedure from the performing physician.  

## 2016-06-20 NOTE — Patient Instructions (Signed)
YOU HAD AN ENDOSCOPIC PROCEDURE TODAY AT [facility]:   Refer to the procedure report that was given to you for any specific questions about what was found during the examination.  If the procedure report does not answer your questions, please call your gastroenterologist to clarify.  If you requested that your care partner not be given the details of your procedure findings, then the procedure report has been included in a sealed envelope for you to review at your convenience later.  YOU SHOULD EXPECT: Some feelings of bloating in the abdomen. Passage of more gas than usual.  Walking can help get rid of the air that was put into your GI tract during the procedure and reduce the bloating. If you had a lower endoscopy (such as a colonoscopy or flexible sigmoidoscopy) you may notice spotting of blood in your stool or on the toilet paper. If you underwent a bowel prep for your procedure, you may not have a normal bowel movement for a few days.  Please Note:  You might notice some irritation and congestion in your nose or some drainage.  This is from the oxygen used during your procedure.  There is no need for concern and it should clear up in a day or so.  SYMPTOMS TO REPORT IMMEDIATELY:   Following lower endoscopy (colonoscopy or flexible sigmoidoscopy):  Excessive amounts of blood in the stool  Significant tenderness or worsening of abdominal pains  Swelling of the abdomen that is new, acute  Fever of 100F or higher   Following upper endoscopy (EGD)  Vomiting of blood or coffee ground material  New chest pain or pain under the shoulder blades  Painful or persistently difficult swallowing  New shortness of breath  Fever of 100F or higher  Black, tarry-looking stools  For urgent or emergent issues, a gastroenterologist can be reached at any hour by calling [phone].   DIET:  We do recommend a small meal at first, but then you may proceed to your regular diet.  Drink  plenty of fluids but you should avoid alcoholic beverages for 24 hours.  ACTIVITY:  You should plan to take it easy for the rest of today and you should NOT DRIVE or use heavy machinery until tomorrow (because of the sedation medicines used during the test).    FOLLOW UP: Our staff will call the number listed on your records the next business day following your procedure to check on you and address any questions or concerns that you may have regarding the information given to you following your procedure. If we do not reach you, we will leave a message.  However, if you are feeling well and you are not experiencing any problems, there is no need to return our call.  We will assume that you have returned to your regular daily activities without incident.  If any biopsies were taken you will be contacted by phone or by letter within the next 1-3 weeks.  Please call us at [phone] if you have not heard about the biopsies in 3 weeks.    SIGNATURES/CONFIDENTIALITY: You and/or your care partner have signed paperwork which will be entered into your electronic medical record.  These signatures attest to the fact that that the information above on your After Visit Summary has been reviewed and is understood.  Full responsibility of the confidentiality of this discharge information lies with you and/or your care-partner.  Await pathology  Your prescription was sent to your [facility]  Please call Dr. Blanch Media office with update on how diarrhea is doing  Please read over handouts

## 2016-06-20 NOTE — Op Note (Signed)
[place] Patient Name: Omaya Winburn Procedure Date: 06/20/2016 2:12 PM MRN: [id number] Endoscopist: Docia Chuck. Henrene Pastor , MD Age: 65 Referring MD:  Date of Birth: 11/09/1950 Gender: Female Account #: [id number] Procedure:                Colonoscopy, with cold snare polypectomy and random                            colon biopsies Indications:              Chronic diarrhea, Rectal bleeding. Iron deficiency.                            Also history of adenomatous colon polyps. Previous                            colonoscopies 2006, 2009, 2014 Medicines:                Monitored Anesthesia Care Procedure:                Pre-Anesthesia Assessment:                           - Prior to the procedure, a History and Physical                            was performed, and patient medications and                            allergies were reviewed. The patient's tolerance of                            previous anesthesia was also reviewed. The risks                            and benefits of the procedure and the sedation                            options and risks were discussed with the patient.                            All questions were answered, and informed consent                            was obtained. Prior Anticoagulants: The patient has                            taken no previous anticoagulant or antiplatelet                            agents. ASA Grade Assessment: II - A patient with                            mild systemic disease. After reviewing the risks  and benefits, the patient was deemed in                            satisfactory condition to undergo the procedure.                           After obtaining informed consent, the colonoscope                            was passed under direct vision. Throughout the                            procedure, the patient's blood pressure, pulse, and                            oxygen saturations were  monitored continuously. The                            Model CF-HQ190L [id number]) scope was introduced                            through the anus and advanced to the the cecum,                            identified by appendiceal orifice and ileocecal                            valve. The ileocecal valve, appendiceal orifice,                            and rectum were photographed. The quality of the                            bowel preparation was excellent. The colonoscopy                            was performed without difficulty. The patient                            tolerated the procedure well. The bowel preparation                            used was SUPREP. Scope In: 2:23:01 PM Scope Out: 2:35:11 PM Scope Withdrawal Time: 0 hours 9 minutes 58 seconds  Total Procedure Duration: 0 hours 12 minutes 10 seconds  Findings:                 A 4 mm polyp was found in the sigmoid colon. The                            polyp was removed with a cold snare. Resection and                            retrieval were complete.  Multiple diverticula were found in the sigmoid                            colon.                           Internal hemorrhoids were found during retroflexion.                           The exam was otherwise without abnormality on                            direct and retroflexion views.                           Biopsies for histology were taken with a cold                            forceps from the entire colon for evaluation of                            microscopic colitis. Complications:            No immediate complications. Estimated blood loss:                            None. Estimated Blood Loss:     Estimated blood loss: none. Impression:               - One 4 mm polyp in the sigmoid colon, removed with                            a cold snare. Resected and retrieved.                           - Diverticulosis in the sigmoid  colon.                           - Internal hemorrhoids.                           - The examination was otherwise normal on direct                            and retroflexion views.                           - Biopsies were taken with a cold forceps from the                            entire colon for evaluation of microscopic colitis. Recommendation:           - Repeat colonoscopy in 5 years for surveillance.                           - EGD today. Please see report.                           -  Await pathology results. Docia Chuck. Henrene Pastor, MD 06/20/2016 2:44:46 PM This report has been signed electronically.

## 2016-06-20 NOTE — Progress Notes (Signed)
No egg or soy allergy known to patient  No issues with past sedation with any surgeries  or procedures, no intubation problems  No diet pills per patient No home 02 use per patient  No blood thinners per patient  Pt denies issues with constipation  No A fib or A flutter   

## 2016-06-20 NOTE — Op Note (Signed)
[place] Patient Name: Tiffany [name] Procedure Date: 06/20/2016 2:12 PM MRN: [id number] Endoscopist: Docia Chuck. Henrene Pastor , MD Age: 65 Referring MD:  Date of Birth: 09-28-1950 Gender: Female Account #: [id number] Procedure:                Upper GI endoscopy, with biopsies Indications:              Diarrhea, abdominal pain, iron deficiency Medicines:                Monitored Anesthesia Care Procedure:                Pre-Anesthesia Assessment:                           - Prior to the procedure, a History and Physical                            was performed, and patient medications and                            allergies were reviewed. The patient's tolerance of                            previous anesthesia was also reviewed. The risks                            and benefits of the procedure and the sedation                            options and risks were discussed with the patient.                            All questions were answered, and informed consent                            was obtained. Prior Anticoagulants: The patient has                            taken no previous anticoagulant or antiplatelet                            agents. ASA Grade Assessment: II - A patient with                            mild systemic disease. After reviewing the risks                            and benefits, the patient was deemed in                            satisfactory condition to undergo the procedure.                           After obtaining informed consent, the endoscope was  passed under direct vision. Throughout the                            procedure, the patient's blood pressure, pulse, and                            oxygen saturations were monitored continuously. The                            Model GIF-HQ190 [id number]) scope was introduced                            through the mouth, and advanced to the second part     of duodenum. The upper GI endoscopy was                            accomplished without difficulty. The patient                            tolerated the procedure well. Scope In: Scope Out: Findings:                 The esophagus was normal.                           There was evidence of prior fundoplication and                            hiatal hernia.The stomach was otherwise normal.                           The examined duodenum was normal.                           The cardia and gastric fundus were normal on                            retroflexion.                           Biopsies for histology were taken with a cold                            forceps in the first portion of the duodenum and in                            the second portion of the duodenum for evaluation                            of celiac disease. Complications:            No immediate complications. Estimated Blood Loss:     Estimated blood loss: none. Impression:               - Normal EGD post fundoplication.                           -  Biopsies were taken with a cold forceps for                            evaluation of celiac disease. Recommendation:           - Prescribe metronidazole 250 mg; #30; 1 by mouth 3                            times a day. To see if this helps with diarrhea.                           - Resume previous diet.                           - Continue present medications.                           - Await biopsy results. Will send you a letter with                            results and additional recommendations, if any John N. Henrene Pastor, MD 06/20/2016 2:52:41 PM This report has been signed electronically.

## 2016-06-21 ENCOUNTER — Telehealth: Payer: Self-pay

## 2016-06-21 NOTE — Telephone Encounter (Signed)
  Follow up Call-  Call back number 06/20/2016  Post procedure Call Back phone  # [phone]  Permission to leave phone message Yes  Some recent data might be hidden     Patient questions:  Do you have a fever, pain , or abdominal swelling? No. Pain Score  0 *  Have you tolerated food without any problems? Yes.    Have you been able to return to your normal activities? Yes.    Do you have any questions about your discharge instructions: Diet   No. Medications  No. Follow up visit  No.  Do you have questions or concerns about your Care? No.  Actions: * If pain score is 4 or above: No action needed, pain <4.

## 2016-06-26 ENCOUNTER — Encounter: Payer: Self-pay | Admitting: Internal Medicine

## 2016-07-01 ENCOUNTER — Ambulatory Visit (INDEPENDENT_AMBULATORY_CARE_PROVIDER_SITE_OTHER): Payer: Medicare Other | Admitting: Orthopaedic Surgery

## 2016-07-01 ENCOUNTER — Telehealth (INDEPENDENT_AMBULATORY_CARE_PROVIDER_SITE_OTHER): Payer: Self-pay | Admitting: Orthopaedic Surgery

## 2016-07-01 ENCOUNTER — Ambulatory Visit (INDEPENDENT_AMBULATORY_CARE_PROVIDER_SITE_OTHER): Payer: Medicare Other | Admitting: Physician Assistant

## 2016-07-01 ENCOUNTER — Encounter (INDEPENDENT_AMBULATORY_CARE_PROVIDER_SITE_OTHER): Payer: Self-pay | Admitting: *Deleted

## 2016-07-01 ENCOUNTER — Ambulatory Visit (INDEPENDENT_AMBULATORY_CARE_PROVIDER_SITE_OTHER): Payer: Medicare Other

## 2016-07-01 VITALS — BP 167/104 | HR 76

## 2016-07-01 DIAGNOSIS — M25572 Pain in left ankle and joints of left foot: Secondary | ICD-10-CM | POA: Diagnosis not present

## 2016-07-01 DIAGNOSIS — S93412A Sprain of calcaneofibular ligament of left ankle, initial encounter: Secondary | ICD-10-CM

## 2016-07-01 NOTE — Progress Notes (Signed)
Office Visit Note   Patient: Tiffany [name]           Date of Birth: 1951/01/09           MRN: [id number] Visit Date: 07/01/2016              Requested by: Prince Solian, MD [address] [place], [place] [address] PCP: Tivis Ringer, MD   Assessment & Plan: Visit Diagnoses:  1. Sprain of calcaneofibular ligament of left ankle, initial encounter     Plan: ASO brace weightbearing as tolerated left ankle ice elevation, and Tylenol  Follow-Up Instructions: Return in about 2 weeks (around 07/15/2016).   Orders:  Orders Placed This Encounter  Procedures  . XR Ankle 2 Views Left   No orders of the defined types were placed in this encounter.     Procedures: No procedures performed   Clinical Data: No additional findings.   Subjective: Chief Complaint  Patient presents with  . Left Ankle - Pain, Edema    HPI   pt fell yesterday am coming out of bathroom and foot under dresser and tried to catch herself and could not bear weight got worse thru out day. Husband helped get up. Pain scale 8/10.   Review of Systems   Objective: Vital Signs: BP (!) 167/104 (BP Location: Right Arm, Cuff Size: Normal)   Pulse 76   Physical Exam  Constitutional: She is oriented to person, place, and time. She appears well-developed and well-nourished.  Cardiovascular: Intact distal pulses.   Pulmonary/Chest: Effort normal.  Neurological: She is alert and oriented to person, place, and time.  Skin: Skin is warm and dry.  Psychiatric: She has a normal mood and affect.    Left Ankle Exam  Swelling: moderate  Tenderness  The patient is experiencing tenderness in the CF.   Range of Motion  Dorsiflexion: abnormal  Plantar flexion: abnormal  Inversion: abnormal  Eversion: abnormal   Other  Erythema: absent Scars: present Sensation: decreased Pulse: present  Comments:  Baseline decreased sensation of the left foot separate the deep peroneal nerve region. Remainder of  the left foot nontender. No tenderness over the medial or lateral malleolus. Tenderness over the calcaneal fibular ligament left ankle.      Specialty Comments:  No specialty comments available.  Imaging: Xr Ankle 2 Views Left  Result Date: 07/01/2016 Left ankle 3 views shows no acute fracture. Talus well located within the ankle mortise no diastases. No other bony abnormalities.    PMFS History: Patient Active Problem List   Diagnosis Date Noted  . Diarrhea 04/03/2016  . Nausea without vomiting 04/03/2016  . Spina bifida of lumbar region without hydrocephalus ([place]) 03/07/2016  . OSA on CPAP 03/07/2016  . Iron deficiency anemia 03/07/2016  . Pica in adults 03/07/2016  . Spina bifida of lumbosacral region without hydrocephalus ([place]) 08/16/2015  . Nocturnal headaches 08/16/2015  . Essential hypertension, malignant 08/16/2015  . Snoring 08/16/2015  . Osteoarthritis of right knee 10/11/2014  . Status post total right knee replacement 10/11/2014  . Primary snoring 07/05/2014  . Bronchiectasis with acute exacerbation ([place]) 12/29/2013  . Bronchiectasis ([place]) 11/10/2013  . DM type 2 (diabetes mellitus, type 2) ([place]) 11/10/2013  . PERSONAL HX COLONIC POLYPS 03/07/2008  . ADENOMATOUS COLONIC POLYP 03/04/2008  . ESOPHAGEAL STRICTURE 03/04/2008  . GERD 03/04/2008  . IBS 03/04/2008  . Dysphagia, pharyngoesophageal phase 02/05/2008   Past Medical History:  Diagnosis Date  . Allergy   . Anemia  pernicious anemia- recieved iron infusions until 10-2015  . Anginal pain ([place])   . Anxiety   . Arthritis    foot drop  lt foot  / toes   wears brace     . Bronchiectasis ([place])   . Cancer ([place])    SKIN    . Cataract    REMOVED  . Chronic cough   . Colon polyps   . Depression   . Diabetes mellitus   . Dysphagia   . Dysrhythmia    RAPID HEART BEAT , CHEST TIGHTNESS  09/29/14     . Gastritis   . GERD (gastroesophageal reflux disease)   . Hiatal hernia   . Hyperlipidemia   .  Hypertension   . IBS (irritable bowel syndrome)   . Insomnia   . Kidney stones    KIDNEY DISEASE   STAGE lll    . Kidney stones   . Macular disorder    MACULAR DISEASE  RT EYE   . Meningitis   . Osteoporosis   . Pneumonia    BRONCHIECTASIS  . Shortness of breath dyspnea    WITH EXERTION  . Sleep apnea    uses cpap  . Spina bifida occulta   . Ulcer ([place])     Family History  Problem Relation Age of Onset  . Alzheimer's disease Father   . Irritable bowel syndrome Father   . Ulcers Father   . Gout Father   . Cancer Mother     ?  . Diabetes Mother   . Hypertension Mother   . Nephrolithiasis Mother   . Colon cancer Mother   . Ovarian cancer Mother   . Coronary artery disease      grandparent  . Thyroid cancer Sister   . Nephrolithiasis Brother   . Colon cancer Maternal Grandmother   . Esophageal cancer Cousin   . Esophageal cancer Cousin   . Colon polyps Neg Hx   . Rectal cancer Neg Hx   . Stomach cancer Neg Hx     Past Surgical History:  Procedure Laterality Date  . ABDOMINAL HYSTERECTOMY    . BACK SURGERY    . CATARACT EXTRACTION  03/2012   bilateral  . COLONOSCOPY    . eardrum     left   ruptured and repaired  . ELBOW SURGERY     left  . fundoplication    . KNEE ARTHROSCOPY     rt knee   . LITHOTRIPSY    . MENISCUS REPAIR    . POLYPECTOMY    . TONSILLECTOMY    . TOTAL KNEE ARTHROPLASTY Right 10/11/2014   Procedure: RIGHT TOTAL KNEE ARTHROPLASTY;  Surgeon: Mcarthur Rossetti, MD;  Location: [facility];  Service: Orthopedics;  Laterality: Right;   Social History   Occupational History  . Retired     Corporate treasurer   Social History Main Topics  . Smoking status: Never Smoker  . Smokeless tobacco: Never Used  . Alcohol use No     Comment: OCC.  . Drug use: No  . Sexual activity: Not on file

## 2016-07-01 NOTE — Progress Notes (Signed)
Office Visit Note   Patient: Tiffany [name]           Date of Birth: 07/25/1951           MRN: [id number] Visit Date: 07/01/2016              Requested by: Prince Solian, MD [address] [place], [place] [address] PCP: Tivis Ringer, MD   Assessment & Plan: Visit Diagnoses:  1. Sprain of calcaneofibular ligament of left ankle, initial encounter     Plan: Ice and elevation. Tylenol for pain. ASO brace left ankle weightbearing as tolerated  Follow-Up Instructions: Return in about 2 weeks (around 07/15/2016).   Orders:  Orders Placed This Encounter  Procedures  . XR Ankle 2 Views Left   No orders of the defined types were placed in this encounter.     Procedures: No procedures performed   Clinical Data: No additional findings.   Subjective: Chief Complaint  Patient presents with  . Left Ankle - Pain, Edema    Tiffany [name] 65 year old female well known by department service who unfortunately injured her left ankle yesterday. She fell causing her to catch her ankle up under dresser. She has a chronic foot drop on the left side, she usually wears an AFO type brace. She's been unable to bear weight on the ankle since the injury. Swelling lateral lateral aspect of the left ankle. She is unable to don a shoe due to the swelling    Review of Systems   Objective: Vital Signs: BP (!) 167/104 (BP Location: Right Arm, Cuff Size: Normal)   Pulse 76   Physical Exam  Constitutional: She is oriented to person, place, and time. She appears well-developed and well-nourished.  Cardiovascular: Intact distal pulses.   Neurological: She is alert and oriented to person, place, and time.  Skin: Skin is warm and dry.  Psychiatric: She has a normal mood and affect.    Left Ankle Exam  Swelling: moderate  Tenderness  The patient is experiencing tenderness in the lateral malleolus.   Range of Motion  Dorsiflexion: abnormal  Plantar flexion: abnormal  Inversion:  abnormal  Eversion: abnormal   Other  Erythema: absent  Comments:  No tenderness over the medial malleolus anterior aspect of the ankle. Tenderness over the calcaneal fibular ligament region. Nontender over the lateral malleolus. Remaining foot and ankle nontender. Calf supple nontender tenderness over the proximal tib-fib. Decreased sensation baseline throughout the left foot except for the deep peroneal nerve region.      Specialty Comments:  No specialty comments available.  Imaging: Xr Ankle 2 Views Left  Result Date: 07/01/2016 Left ankle 3 views shows no acute fracture. Talus well located within the ankle mortise no diastases. No other bony abnormalities.    PMFS History: Patient Active Problem List   Diagnosis Date Noted  . Diarrhea 04/03/2016  . Nausea without vomiting 04/03/2016  . Spina bifida of lumbar region without hydrocephalus ([place]) 03/07/2016  . OSA on CPAP 03/07/2016  . Iron deficiency anemia 03/07/2016  . Pica in adults 03/07/2016  . Spina bifida of lumbosacral region without hydrocephalus ([place]) 08/16/2015  . Nocturnal headaches 08/16/2015  . Essential hypertension, malignant 08/16/2015  . Snoring 08/16/2015  . Osteoarthritis of right knee 10/11/2014  . Status post total right knee replacement 10/11/2014  . Primary snoring 07/05/2014  . Bronchiectasis with acute exacerbation ([place]) 12/29/2013  . Bronchiectasis ([place]) 11/10/2013  . DM type 2 (diabetes mellitus, type 2) ([place]) 11/10/2013  . PERSONAL  HX COLONIC POLYPS 03/07/2008  . ADENOMATOUS COLONIC POLYP 03/04/2008  . ESOPHAGEAL STRICTURE 03/04/2008  . GERD 03/04/2008  . IBS 03/04/2008  . Dysphagia, pharyngoesophageal phase 02/05/2008   Past Medical History:  Diagnosis Date  . Allergy   . Anemia    pernicious anemia- recieved iron infusions until 10-2015  . Anginal pain ([place])   . Anxiety   . Arthritis    foot drop  lt foot  / toes   wears brace     . Bronchiectasis ([place])   . Cancer ([place])     SKIN    . Cataract    REMOVED  . Chronic cough   . Colon polyps   . Depression   . Diabetes mellitus   . Dysphagia   . Dysrhythmia    RAPID HEART BEAT , CHEST TIGHTNESS  09/29/14     . Gastritis   . GERD (gastroesophageal reflux disease)   . Hiatal hernia   . Hyperlipidemia   . Hypertension   . IBS (irritable bowel syndrome)   . Insomnia   . Kidney stones    KIDNEY DISEASE   STAGE lll    . Kidney stones   . Macular disorder    MACULAR DISEASE  RT EYE   . Meningitis   . Osteoporosis   . Pneumonia    BRONCHIECTASIS  . Shortness of breath dyspnea    WITH EXERTION  . Sleep apnea    uses cpap  . Spina bifida occulta   . Ulcer ([place])     Family History  Problem Relation Age of Onset  . Alzheimer's disease Father   . Irritable bowel syndrome Father   . Ulcers Father   . Gout Father   . Cancer Mother     ?  . Diabetes Mother   . Hypertension Mother   . Nephrolithiasis Mother   . Colon cancer Mother   . Ovarian cancer Mother   . Coronary artery disease      grandparent  . Thyroid cancer Sister   . Nephrolithiasis Brother   . Colon cancer Maternal Grandmother   . Esophageal cancer Cousin   . Esophageal cancer Cousin   . Colon polyps Neg Hx   . Rectal cancer Neg Hx   . Stomach cancer Neg Hx     Past Surgical History:  Procedure Laterality Date  . ABDOMINAL HYSTERECTOMY    . BACK SURGERY    . CATARACT EXTRACTION  03/2012   bilateral  . COLONOSCOPY    . eardrum     left   ruptured and repaired  . ELBOW SURGERY     left  . fundoplication    . KNEE ARTHROSCOPY     rt knee   . LITHOTRIPSY    . MENISCUS REPAIR    . POLYPECTOMY    . TONSILLECTOMY    . TOTAL KNEE ARTHROPLASTY Right 10/11/2014   Procedure: RIGHT TOTAL KNEE ARTHROPLASTY;  Surgeon: Mcarthur Rossetti, MD;  Location: [facility];  Service: Orthopedics;  Laterality: Right;   Social History   Occupational History  . Retired     Corporate treasurer   Social History Main Topics  . Smoking status: Never Smoker  .  Smokeless tobacco: Never Used  . Alcohol use No     Comment: OCC.  . Drug use: No  . Sexual activity: Not on file

## 2016-07-01 NOTE — Telephone Encounter (Signed)
Patient left voice mail for return call to schedule appointment with Ninfa Linden or Artis Delay for possible ankle fracture.  I called home number and left voice mail for return call to schedule.

## 2016-07-15 ENCOUNTER — Ambulatory Visit (INDEPENDENT_AMBULATORY_CARE_PROVIDER_SITE_OTHER): Payer: Medicare Other | Admitting: Physician Assistant

## 2016-08-18 ENCOUNTER — Telehealth: Payer: Self-pay | Admitting: Internal Medicine

## 2016-08-18 MED ORDER — AMOXICILLIN-POT CLAVULANATE 875-125 MG PO TABS: 1.0000 | ORAL_TABLET | Freq: Two times a day (BID) | ORAL | 0 refills | Status: AC

## 2016-08-18 NOTE — Telephone Encounter (Signed)
Flare of cough green sputum and mild sob/ chest tightness/ has proair but doesn't know how to use it  22 allergies listed but says tolerated augmentin in past so rec  Only use your albuterol as a rescue medication to be used if you can't catch your breath by resting or doing a relaxed purse lip breathing pattern.  - The less you use it, the better it will work when you need it. - Ok to use up to 2 puffs  every 4 hours if you must but call for immediate appointment if use goes up over your usual need - Don't leave home without it !!  (think of it like the spare tire for your car)   Augmentin 875 mg take one pill twice daily  X 10 days - take at breakfast and supper with large glass of water.  It would help reduce the usual side effects (diarrhea and yeast infections) if you ate cultured yogurt at lunch.

## 2016-08-19 ENCOUNTER — Encounter (HOSPITAL_COMMUNITY): Payer: Self-pay

## 2016-08-19 ENCOUNTER — Emergency Department (HOSPITAL_COMMUNITY)
Admission: EM | Admit: 2016-08-19 | Discharge: 2016-08-19 | Disposition: A | Discharge status: Home / Self Care | Payer: Medicare Other | Attending: Physician Assistant | Admitting: Physician Assistant

## 2016-08-19 ENCOUNTER — Emergency Department (HOSPITAL_COMMUNITY): Payer: Medicare Other

## 2016-08-19 DIAGNOSIS — Z96651 Presence of right artificial knee joint: Secondary | ICD-10-CM | POA: Insufficient documentation

## 2016-08-19 DIAGNOSIS — J181 Lobar pneumonia, unspecified organism: Secondary | ICD-10-CM | POA: Insufficient documentation

## 2016-08-19 DIAGNOSIS — R0602 Shortness of breath: Secondary | ICD-10-CM | POA: Diagnosis present

## 2016-08-19 DIAGNOSIS — Z91040 Latex allergy status: Secondary | ICD-10-CM | POA: Diagnosis not present

## 2016-08-19 DIAGNOSIS — I10 Essential (primary) hypertension: Secondary | ICD-10-CM | POA: Diagnosis not present

## 2016-08-19 DIAGNOSIS — Z7984 Long term (current) use of oral hypoglycemic drugs: Secondary | ICD-10-CM | POA: Diagnosis not present

## 2016-08-19 DIAGNOSIS — Z85828 Personal history of other malignant neoplasm of skin: Secondary | ICD-10-CM | POA: Insufficient documentation

## 2016-08-19 DIAGNOSIS — J189 Pneumonia, unspecified organism: Secondary | ICD-10-CM

## 2016-08-19 DIAGNOSIS — E119 Type 2 diabetes mellitus without complications: Secondary | ICD-10-CM | POA: Diagnosis not present

## 2016-08-19 DIAGNOSIS — Z79899 Other long term (current) drug therapy: Secondary | ICD-10-CM | POA: Diagnosis not present

## 2016-08-19 LAB — BASIC METABOLIC PANEL
Anion gap: 9 (ref 5–15)
BUN: 16 mg/dL (ref 6–20)
CALCIUM: 10 mg/dL (ref 8.9–10.3)
CHLORIDE: 103 mmol/L (ref 101–111)
CO2: 26 mmol/L (ref 22–32)
CREATININE: 1.27 mg/dL — AB (ref 0.44–1.00)
GFR calc non Af Amer: 44 mL/min — ABNORMAL LOW (ref >60)
GFR, EST AFRICAN AMERICAN: 51 mL/min — AB (ref >60)
Glucose, Bld: 216 mg/dL — ABNORMAL HIGH (ref 65–99)
Potassium: 3.6 mmol/L (ref 3.5–5.1)
SODIUM: 138 mmol/L (ref 135–145)

## 2016-08-19 LAB — CBC
HCT: 37 % (ref 36.0–46.0)
Hemoglobin: 12.1 g/dL (ref 12.0–15.0)
MCH: 30.6 pg (ref 26.0–34.0)
MCHC: 32.7 g/dL (ref 30.0–36.0)
MCV: 93.4 fL (ref 78.0–100.0)
PLATELETS: 146 10*3/uL — AB (ref 150–400)
RBC: 3.96 MIL/uL (ref 3.87–5.11)
RDW: 13.2 % (ref 11.5–15.5)
WBC: 3.9 10*3/uL — AB (ref 4.0–10.5)

## 2016-08-19 LAB — I-STAT TROPONIN, ED: TROPONIN I, POC: 0 ng/mL (ref 0.00–0.08)

## 2016-08-19 MED ORDER — DOXYCYCLINE HYCLATE 100 MG PO TABS
100.0000 mg | ORAL_TABLET | Freq: Once | ORAL | Status: AC
  Administered 2016-08-19: 100 mg via ORAL
  Filled 2016-08-19: qty 1

## 2016-08-19 MED ORDER — PREDNISONE 20 MG PO TABS: ORAL_TABLET | ORAL | 0 refills | Status: DC

## 2016-08-19 MED ORDER — DOXYCYCLINE HYCLATE 100 MG PO CAPS: 100.0000 mg | ORAL_CAPSULE | Freq: Two times a day (BID) | ORAL | 0 refills | Status: DC

## 2016-08-19 NOTE — Discharge Instructions (Signed)
You were seen today with cough and short of breath and found to have a pneumonia. We have increased your antibiotics to include doxycycline as well as the other antibiotics you were on. We recommended take prednisone to help with her breathing however we understand this will raise your sugars.  Please call Dr.Alva in the morning to touch base,. Please return immediately if you have a failure unable to catch yourr breath, unable to take fluids, or any other concerns.

## 2016-08-19 NOTE — ED Notes (Signed)
Pt SpO2 89% on room air. Placed on [place] at 2L.

## 2016-08-19 NOTE — ED Triage Notes (Signed)
Pt complaining of SOB and cough x 2 days. Pt states coughing up green sputum. Pt states just placed on amoxicillin yesterday. Pt with increased SOB today. Pt 100% RA at triage. Pt states hx of bronchiectasis.

## 2016-08-19 NOTE — ED Notes (Signed)
While ambulating patient Pulse Ox saturation remained at 100%

## 2016-08-19 NOTE — ED Provider Notes (Signed)
[facility] DEPT Provider Note   CSN: [id number] Arrival date & time: 08/19/16  1809     History   Chief Complaint Chief Complaint  Patient presents with  . Shortness of Breath  . Cough    HPI Tiffany [name] is a 65 y.o. female.  HPI   Patient is a 65 year old female followed by Dr. Elsworth Soho from pulmonology for bronchiectasis presenting today with shortness of breath. Patient was started on Augmentin on Saturday when she called the office for increasing shortness of breath. Since Friday she has had increased shortness breath, cough, green sputum. As not had any imaging previously.  No fevers. No change in appetite. No recent immobilization.  Past Medical History:  Diagnosis Date  . Allergy   . Anemia    pernicious anemia- recieved iron infusions until 10-2015  . Anginal pain ([place])   . Anxiety   . Arthritis    foot drop  lt foot  / toes   wears brace     . Bronchiectasis ([place])   . Cancer ([place])    SKIN    . Cataract    REMOVED  . Chronic cough   . Colon polyps   . Depression   . Diabetes mellitus   . Dysphagia   . Dysrhythmia    RAPID HEART BEAT , CHEST TIGHTNESS  09/29/14     . Gastritis   . GERD (gastroesophageal reflux disease)   . Hiatal hernia   . Hyperlipidemia   . Hypertension   . IBS (irritable bowel syndrome)   . Insomnia   . Kidney stones    KIDNEY DISEASE   STAGE lll    . Kidney stones   . Macular disorder    MACULAR DISEASE  RT EYE   . Meningitis   . Osteoporosis   . Pneumonia    BRONCHIECTASIS  . Shortness of breath dyspnea    WITH EXERTION  . Sleep apnea    uses cpap  . Spina bifida occulta   . Ulcer [facility])     Patient Active Problem List   Diagnosis Date Noted  . Diarrhea 04/03/2016  . Nausea without vomiting 04/03/2016  . Spina bifida of lumbar region without hydrocephalus ([place]) 03/07/2016  . OSA on CPAP 03/07/2016  . Iron deficiency anemia 03/07/2016  . Pica in adults 03/07/2016  . Spina bifida of lumbosacral region without  hydrocephalus ([place]) 08/16/2015  . Nocturnal headaches 08/16/2015  . Essential hypertension, malignant 08/16/2015  . Snoring 08/16/2015  . Osteoarthritis of right knee 10/11/2014  . Status post total right knee replacement 10/11/2014  . Primary snoring 07/05/2014  . Bronchiectasis with acute exacerbation ([place]) 12/29/2013  . Bronchiectasis ([place]) 11/10/2013  . DM type 2 (diabetes mellitus, type 2) ([place]) 11/10/2013  . PERSONAL HX COLONIC POLYPS 03/07/2008  . ADENOMATOUS COLONIC POLYP 03/04/2008  . ESOPHAGEAL STRICTURE 03/04/2008  . GERD 03/04/2008  . IBS 03/04/2008  . Dysphagia, pharyngoesophageal phase 02/05/2008    Past Surgical History:  Procedure Laterality Date  . ABDOMINAL HYSTERECTOMY    . BACK SURGERY    . CATARACT EXTRACTION  03/2012   bilateral  . COLONOSCOPY    . eardrum     left   ruptured and repaired  . ELBOW SURGERY     left  . fundoplication    . KNEE ARTHROSCOPY     rt knee   . LITHOTRIPSY    . MENISCUS REPAIR    . POLYPECTOMY    . TONSILLECTOMY    .  TOTAL KNEE ARTHROPLASTY Right 10/11/2014   Procedure: RIGHT TOTAL KNEE ARTHROPLASTY;  Surgeon: Mcarthur Rossetti, MD;  Location: [facility];  Service: Orthopedics;  Laterality: Right;    OB History    No data available       Home Medications    Prior to Admission medications   Medication Sig Start Date End Date Taking? Authorizing Provider  acetaminophen (TYLENOL) 500 MG tablet Take 1,000 mg by mouth every 8 (eight) hours as needed for mild pain or moderate pain.    Historical Provider, MD  Aflibercept (EYLEA IO) Inject into the eye every 3 (three) months.    Historical Provider, MD  amLODipine (NORVASC) 2.5 MG tablet Take 2.5 mg by mouth daily.    Historical Provider, MD  amoxicillin-clavulanate (AUGMENTIN) 875-125 MG tablet Take 1 tablet by mouth 2 (two) times daily. 08/18/16 08/28/16  Tanda Rockers, MD  atorvastatin (LIPITOR) 40 MG tablet Take 1 tablet (40 mg total) by mouth daily. 12/22/15   Fay Records, MD  Calcium Carbonate-Vit D-Min (CALCIUM 1200 PO) Take 1 tablet by mouth daily.    Historical Provider, MD  Cholecalciferol (VITAMIN D) 2000 UNITS CAPS Take 2 capsules by mouth daily.    Historical Provider, MD  diazepam (VALIUM) 10 MG tablet Take 10 mg by mouth at bedtime.    Historical Provider, MD  DULoxetine (CYMBALTA) 60 MG capsule Take 60 mg by mouth daily. 03/24/15   Historical Provider, MD  EPIPEN 2-PAK 0.3 MG/0.3ML SOAJ injection as needed. 08/02/14   Historical Provider, MD  ferric gluconate (NULECIT) 12.5 MG/ML injection Inject 5 mLs (62.5 mg total) into the vein once. 11/14/15   Asencion Partridge Dohmeier, MD  furosemide (LASIX) 40 MG tablet Take 40 mg by mouth daily as needed for fluid or edema. Reported on 03/07/2016    Historical Provider, MD  Loperamide HCl (IMODIUM PO) Take by mouth as needed.    Historical Provider, MD  loratadine (CLARITIN) 10 MG tablet Take 10 mg by mouth daily as needed for allergies.     Historical Provider, MD  meclizine (ANTIVERT) 25 MG tablet Take 25 mg by mouth every 6 (six) hours as needed. 09/19/15   Historical Provider, MD  metFORMIN (GLUCOPHAGE) 500 MG tablet Take 500 mg by mouth 2 (two) times daily.     Historical Provider, MD  metoprolol succinate (TOPROL-XL) 50 MG 24 hr tablet Take 100 mg by mouth daily.  03/24/15   Historical Provider, MD  metroNIDAZOLE (FLAGYL) 250 MG tablet Take 1 tablet (250 mg total) by mouth 3 (three) times daily. 06/20/16   Irene Shipper, MD  ONE TOUCH ULTRA TEST test strip as needed. 09/23/14   Historical Provider, MD  Jonetta Speak LANCETS [id number] MISC as directed. 09/23/14   Historical Provider, MD  Polyethyl Glycol-Propyl Glycol (SYSTANE ULTRA) 0.4-0.3 % SOLN Apply 1-2 drops to eye daily.    Historical Provider, MD  Probiotic Product (ALIGN) 4 MG CAPS Take 1 capsule by mouth daily as needed (IBS).     Historical Provider, MD  promethazine (PHENERGAN) 25 MG tablet Take 25 mg by mouth every 6 (six) hours as needed for nausea or vomiting.      Historical Provider, MD  tobramycin (TOBREX) 0.3 % ophthalmic solution Place 1 drop into the right eye every 6 (six) hours. The day before, the day of, and the after procedure.    Historical Provider, MD  VITAMIN B1-B12 IJ Inject as directed every 30 (thirty) days.    Historical Provider, MD  Family History Family History  Problem Relation Age of Onset  . Alzheimer's disease Father   . Irritable bowel syndrome Father   . Ulcers Father   . Gout Father   . Cancer Mother     ?  . Diabetes Mother   . Hypertension Mother   . Nephrolithiasis Mother   . Colon cancer Mother   . Ovarian cancer Mother   . Coronary artery disease      grandparent  . Thyroid cancer Sister   . Nephrolithiasis Brother   . Colon cancer Maternal Grandmother   . Esophageal cancer Cousin   . Esophageal cancer Cousin   . Colon polyps Neg Hx   . Rectal cancer Neg Hx   . Stomach cancer Neg Hx     Social History Social History  Substance Use Topics  . Smoking status: Never Smoker  . Smokeless tobacco: Never Used  . Alcohol use No     Comment: OCC.     Allergies   Ambien [zolpidem tartrate]; Aspirin; Benicar [olmesartan medoxomil]; Codeine; Erythromycin; Ketorolac; Ketorolac tromethamine; Latex; Lisinopril; Olmesartan; Other; Reclast [zoledronic acid]; Toradol [ketorolac tromethamine]; Zolpidem; Fenofibrate; Glucosamine; Glucosamine forte [nutritional supplements]; Levaquin [levofloxacin in d5w]; Levofloxacin; Percocet [oxycodone-acetaminophen]; Tramadol; Lomotil [diphenoxylate-atropine]; and Tizanidine   Review of Systems Review of Systems  Constitutional: Negative for fever.  Respiratory: Positive for cough, chest tightness, shortness of breath and wheezing.   Cardiovascular: Negative for chest pain.  Gastrointestinal: Negative for abdominal pain.  Neurological: Positive for dizziness, weakness and light-headedness.  All other systems reviewed and are negative.    Physical Exam Updated Vital  Signs BP 135/86   Pulse 85   Temp 98.6 F (37 C) (Oral)   Resp 17   SpO2 100%   Physical Exam  Constitutional: She is oriented to person, place, and time. She appears well-developed and well-nourished.  HENT:  Head: Normocephalic and atraumatic.  Eyes: Right eye exhibits no discharge.  Cardiovascular: Normal rate and regular rhythm.   No murmur heard. Pulmonary/Chest: Breath sounds normal. No respiratory distress. She has no wheezes. She has no rales.  Tachypnea. Speaking in partial sentences   Neurological: She is oriented to person, place, and time.  Skin: Skin is warm and dry. She is not diaphoretic.  Psychiatric: She has a normal mood and affect.  Nursing note and vitals reviewed.    ED Treatments / Results  Labs (all labs ordered are listed, but only abnormal results are displayed) Labs Reviewed  BASIC METABOLIC PANEL - Abnormal; Notable for the following:       Result Value   Glucose, Bld 216 (*)    Creatinine, Ser 1.27 (*)    GFR calc non Af Amer 44 (*)    GFR calc Af Amer 51 (*)    All other components within normal limits  CBC - Abnormal; Notable for the following:    WBC 3.9 (*)    Platelets 146 (*)    All other components within normal limits  I-STAT TROPOININ, ED    EKG  EKG Interpretation  Date/Time:  Monday August 19 2016 18:19:36 EST Ventricular Rate:  86 PR Interval:  118 QRS Duration: 76 QT Interval:  396 QTC Calculation: 473 R Axis:   41 Text Interpretation:  Normal sinus rhythm Normal ECG When compared with ECG of 11/09/2013, HEART RATE has decreased Confirmed by Roxanne Mins  MD, DAVID ([id number]) on 08/19/2016 6:40:35 PM       Radiology Dg Chest 2 View  Result Date: 08/19/2016 CLINICAL DATA:  Acute onset of shortness of breath and cough. Initial encounter. EXAM: CHEST  2 VIEW COMPARISON:  Chest radiograph performed 11/09/2013, and CT of the chest performed 07/01/2014 FINDINGS: The lungs are well-aerated. Peribronchial thickening is noted. Mild  left basilar opacity could reflect pneumonia, given the patient's symptoms. There is no evidence of pleural effusion or pneumothorax. The heart is normal in size; the mediastinal contour is within normal limits. No acute osseous abnormalities are seen. IMPRESSION: Peribronchial thickening noted. Mild left basilar airspace opacity could reflect pneumonia, given the patient's symptoms. Electronically Signed   By: Garald Balding M.D.   On: 08/19/2016 19:07    Procedures Procedures (including critical care time)  Medications Ordered in ED Medications - No data to display   Initial Impression / Assessment and Plan / ED Course  I have reviewed the triage vital signs and the nursing notes.  Pertinent labs & imaging results that were available during my care of the patient were reviewed by me and considered in my medical decision making (see chart for details).  Clinical Course     Patient is a 65 year old female with bronchitis is presenting with increasing shortness breath, sputum. Patient has been on abx for 2 days. She feels that she's been getting worse. On physical exam she is only very mildly tachypneic with mild shortness of breath. We will do ambulatory pulse ox,she may have to come into the hospital for oxygen, and abx.   Ambulated without issue. I never saw a low oxygen for the three hours I cared for patietn.  She is not tachypnic and feels improved. Will add additional coverage for CAP because of bronchiolitis ho. Have hercall her puilmonologist in the am.    Final Clinical Impressions(s) / ED Diagnoses   Final diagnoses:  None    New Prescriptions New Prescriptions   No medications on file     Nikcole Eischeid Julio Alm, MD 08/20/16 1507

## 2016-08-20 ENCOUNTER — Telehealth: Payer: Self-pay | Admitting: Pulmonary Disease

## 2016-08-20 NOTE — Telephone Encounter (Signed)
Called and spoke with pt and she is aware of MW recs.  She was upset by this since RA has always told her to call and she could be seen in the office.  Pt stated that she was dx with PNA last night in the ER and is still taking the augmentin bid, given doxy and prednisone from the ER, but these have not been filled yet---she is DM and pred will make this be off the charts.  Pt is worried about taking 2 abx and wanted this to be sent to RA--I will send this back to RA for recs.  She feels that she needs to be seen by someone in our practice this week.  RA please advise. Thanks  Allergies  Allergen Reactions  . Ambien [Zolpidem Tartrate] Other (See Comments)    Nightmares and insomnia  . Aspirin Other (See Comments)    GI bleed Other Reaction: OTHER REACTION GI bleed  . Benicar [Olmesartan Medoxomil] Shortness Of Breath, Swelling and Other (See Comments)    Swelling of throat  . Codeine Nausea And Vomiting  . Erythromycin Nausea And Vomiting  . Ketorolac Anaphylaxis  . Ketorolac Tromethamine Anaphylaxis  . Latex Shortness Of Breath and Itching  . Lisinopril Shortness Of Breath, Swelling and Other (See Comments)     Swelling of throat  Swelling of throat  . Olmesartan Other (See Comments), Shortness Of Breath and Swelling    Swelling of throat  . Other Anaphylaxis    Peaches and mangoes, tree nuts, tomatoes, shellfish, Cat dandor, green peas Peaches and mangoes, tree nuts, tomatoes, shellfish, Cat dandor, green peas  . Reclast [Zoledronic Acid] Other (See Comments)    Ocular reaction, toxic Ocular reaction, toxic  . Toradol [Ketorolac Tromethamine] Anaphylaxis  . Zolpidem Other (See Comments)    Nightmares and insomnia  . Fenofibrate Nausea And Vomiting  . Glucosamine Diarrhea and Nausea Only  . Glucosamine Forte [Nutritional Supplements] Diarrhea and Nausea Only  . Levaquin [Levofloxacin In D5w] Diarrhea and Swelling  . Levofloxacin Diarrhea and Swelling  . Percocet  [Oxycodone-Acetaminophen] Nausea And Vomiting  . Tramadol Nausea And Vomiting  . Lomotil [Diphenoxylate-Atropine]     Hallucinations   . Tizanidine Nausea And Vomiting and Other (See Comments)    unknown unknown

## 2016-08-20 NOTE — Telephone Encounter (Signed)
Message will be sent to DOD as RA has not responded.

## 2016-08-20 NOTE — Telephone Encounter (Signed)
Pt calling stating that she was in the hosptial and has pneumonia in her lung and says she talked to MW who was on call she is having breathing problems, please advise.Tiffany [name] ]

## 2016-08-20 NOTE — Telephone Encounter (Signed)
Spoke with pt. She states that talked to [facility] on Saturday. Pt ended up going to the ED and was diagnosed with PNA. She is currently taking Augmentin. Reports cough, SOB and wheezing. Cough is producing green mucus. Chest is very tight and causing discomfort. Pt would like RA's recommendations.  RA - please advise. Thanks.

## 2016-08-20 NOTE — Telephone Encounter (Signed)
Sorry to hear this  But Too many allergies to risk treating over the phone, so if not responding to er RX will need to be seen in office or go back to ER

## 2016-08-21 NOTE — Telephone Encounter (Signed)
Spoke with the pt and notified of recs per RA  She verbalized understanding and nothing further needed  OV with MW at 1:30 pm tomorrow (No NP available)

## 2016-08-21 NOTE — Telephone Encounter (Signed)
She can start Augmentin and prednisone Please arrange for her to be seen by NP/provider in the office next few days

## 2016-08-22 ENCOUNTER — Encounter: Payer: Self-pay | Admitting: Internal Medicine

## 2016-08-22 ENCOUNTER — Ambulatory Visit (INDEPENDENT_AMBULATORY_CARE_PROVIDER_SITE_OTHER): Payer: Medicare Other | Admitting: Internal Medicine

## 2016-08-22 VITALS — BP 128/70 | HR 74 | Ht 63.0 in | Wt 172.0 lb

## 2016-08-22 DIAGNOSIS — J471 Bronchiectasis with (acute) exacerbation: Secondary | ICD-10-CM

## 2016-08-22 DIAGNOSIS — J479 Bronchiectasis, uncomplicated: Secondary | ICD-10-CM | POA: Diagnosis not present

## 2016-08-22 MED ORDER — FLUTTER DEVI
0 refills | Status: DC
Start: 2016-08-22 — End: 2019-08-03

## 2016-08-22 NOTE — Patient Instructions (Addendum)
Continue to use proair up to 2 puffs evey 4 h as needed for cough/ congestion  Finish the augmentin and return in 2 weeks for follow up cxr   For cough > mucinex or mucinex dm up to 1200 mg  Every 12 hours and use the flutter

## 2016-08-22 NOTE — Progress Notes (Signed)
Subjective:    Patient ID: Tiffany [name], female    DOB: 24-Jan-1951, 65 y.o.   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, retired [employer] scientist, never smoker with bronchiectasis  She was admitted 3/17-3/21/15 for hypoxia and bronchospasm and infiltrates on CT that resolved on follow-up imaging  Her past medical history significant for spina bifida, Nissen's fundoplication in the [id number] infusion reaction in 2012, hypertension and diabetes   dr Henrene Pastor >>  esophageal dilatation in the past 01/2008 UGI series showed Achalasia-like appearance of the distal esophagus, s/o stricture of the distal esophagus    Significant tests/ events  12/2013 Spirometry showed no evidence of airway obstruction normal lung function  10/2013 CT angio chest  was negative for pulmonary embolism, showed extensive bronchiectatic changes bilaterally, with nodularity and interstitial prominence in both lower lobes. There was an 8 mm nodule in the right middle lobe which was the largest. There was a faint infiltrate in the left upper lobe.    08/22/2016 acute extended ov/[facility] re: ? pna on #4 augmentin/ has saba but not really using appropriately  Chief Complaint  Patient presents with  . Acute Visit    Pt states went to ED on 08/19/16 and was dxed with PNA. She is c/o "deep cough" and increased SOB. Her cough is prod with large amounts of green sputum. She is currently on Augmentin.   08/17/16 acute onset  sob then next day tremors/cough/wheeze  with green mucus, rx with augmentin by on call MD 08/18/16 then to ER 12/25 confirmed dx pna and rec doxy/pred not started yet  "Tremors" (? Chills)   Are now gone, never had fever but never does.    No cp  Sob/ green sputum persist and  still can't lie back in bed due to sob but ok at rest sitting  No obvious day to day or daytime variability or assoc mucus plugs or hemoptysis or cp or chest tightness, subjective wheeze or overt sinus or hb  symptoms. No unusual exp hx or h/o childhood pna/ asthma or knowledge of premature birth.  Sleeping ok without nocturnal  or early am exacerbation  of respiratory  c/o's or need for noct saba. Also denies any obvious fluctuation of symptoms with weather or environmental changes or other aggravating or alleviating factors except as outlined above   Current Medications, Allergies, Complete Past Medical History, Past Surgical History, Family History, and Social History were reviewed in [employer] record.  ROS  The following are not active complaints unless bolded sore throat, dysphagia, dental problems, itching, sneezing,  nasal congestion or excess/ purulent secretions, ear ache,   fever, chills, sweats, unintended wt loss, classically pleuritic or exertional cp,  orthopnea pnd or leg swelling, presyncope, palpitations, abdominal pain, anorexia, nausea, vomiting, diarrhea  or change in bowel or bladder habits, change in stools or urine, dysuria,hematuria,  rash, arthralgias, visual complaints, headache, numbness, weakness or ataxia or problems with walking or coordination,  change in mood/affect or memory.                 Objective:   Physical Exam    amb wf nad but harsh upper airway coughing fits  Wt Readings from Last 3 Encounters:  08/22/16 172 lb (78 kg)  06/20/16 172 lb (78 kg)  06/03/16 172 lb 12.8 oz (78.4 kg)    Vital signs reviewed - Note on arrival 02 sats  99% on RA     HEENT: nl dentition, turbinates,  and oropharynx. Nl external ear canals without cough reflex   NECK :  without JVD/Nodes/TM/ nl carotid upstrokes bilaterally   LUNGS: no acc muscle use,  Nl contour chest with junky insp / exp rhonchi   CV:  RRR  no s3 or murmur or increase in P2, nad no edema   ABD:  soft and nontender with nl inspiratory excursion in the supine position. No bruits or organomegaly appreciated, bowel sounds nl  MS:  Nl gait/ ext warm without deformities, calf  tenderness, cyanosis or clubbing No obvious joint restrictions   SKIN: warm and dry without lesions    NEURO:  alert, approp, nl sensorium with  no motor or cerebellar deficits apparent.     I personally reviewed images and agree with radiology impression as follows:  CXR:  08/19/16  Peribronchial thickening noted. Mild left basilar airspace opacity could reflect pneumonia, given the patient's symptoms.      Assessment & Plan:

## 2016-08-24 ENCOUNTER — Encounter: Payer: Self-pay | Admitting: Internal Medicine

## 2016-08-24 NOTE — Assessment & Plan Note (Addendum)
Acute flare 08/18/16 with ? LLL infiltrate > rx augmentin x 10 days   There is no convincing air space dz on cxr and "tremors" (? Chills) are gone but mucus is still purulent and prominent rhonchi on exam and needs to remember to use the flutter valve more consistently esp during flares and may benefit from more aggressive bronchodilator regimen but she has such long list of allergies reluctant to add any new rx at this point and just advised/ trained on approp use of saba/flutter    She has doxy/pred on hand if does not respond to the augmentin but either way needs f/u in 2 weeks with repeat cxr on return  I had an extended discussion with the patient reviewing all relevant studies completed to date and  lasting 25 minutes of a 40  minute acute  visit in pt not previously known to me re  non-specific but potentially very serious pulmonary symptoms of unknown etiology.  Each maintenance medication was reviewed in detail including most importantly the difference between maintenance and prns and under what circumstances the prns are to be triggered using an action plan format that is not reflected in the computer generated alphabetically organized AVS.    Please see AVS for unique instructions that I personally wrote and verbalized to the the pt in detail and then reviewed with pt  by my nurse highlighting any  changes in therapy recommended at today's visit to their plan of care.

## 2016-09-03 ENCOUNTER — Ambulatory Visit (INDEPENDENT_AMBULATORY_CARE_PROVIDER_SITE_OTHER)
Admission: RE | Admit: 2016-09-03 | Discharge: 2016-09-03 | Disposition: A | Discharge status: Home / Self Care | Payer: Medicare Other | Source: Ambulatory Visit | Attending: Pulmonary Disease | Admitting: Pulmonary Disease

## 2016-09-03 ENCOUNTER — Telehealth: Payer: Self-pay | Admitting: Pulmonary Disease

## 2016-09-03 ENCOUNTER — Other Ambulatory Visit (INDEPENDENT_AMBULATORY_CARE_PROVIDER_SITE_OTHER): Payer: Medicare Other

## 2016-09-03 ENCOUNTER — Encounter: Payer: Self-pay | Admitting: Pulmonary Disease

## 2016-09-03 ENCOUNTER — Ambulatory Visit (INDEPENDENT_AMBULATORY_CARE_PROVIDER_SITE_OTHER): Payer: Medicare Other | Admitting: Pulmonary Disease

## 2016-09-03 VITALS — BP 150/98 | HR 115 | Ht 63.0 in | Wt 176.8 lb

## 2016-09-03 DIAGNOSIS — J189 Pneumonia, unspecified organism: Secondary | ICD-10-CM

## 2016-09-03 DIAGNOSIS — J181 Lobar pneumonia, unspecified organism: Secondary | ICD-10-CM | POA: Diagnosis not present

## 2016-09-03 DIAGNOSIS — J471 Bronchiectasis with (acute) exacerbation: Secondary | ICD-10-CM

## 2016-09-03 LAB — CBC WITH DIFFERENTIAL/PLATELET
BASOS ABS: 0 10*3/uL (ref 0.0–0.1)
Basophils Relative: 0.1 % (ref 0.0–3.0)
Eosinophils Absolute: 0.1 10*3/uL (ref 0.0–0.7)
Eosinophils Relative: 1.8 % (ref 0.0–5.0)
HCT: 34.7 % — ABNORMAL LOW (ref 36.0–46.0)
Hemoglobin: 12 g/dL (ref 12.0–15.0)
LYMPHS ABS: 2.1 10*3/uL (ref 0.7–4.0)
Lymphocytes Relative: 28.7 % (ref 12.0–46.0)
MCHC: 34.7 g/dL (ref 30.0–36.0)
MCV: 91 fl (ref 78.0–100.0)
MONO ABS: 0.5 10*3/uL (ref 0.1–1.0)
MONOS PCT: 7.3 % (ref 3.0–12.0)
NEUTROS PCT: 62.1 % (ref 43.0–77.0)
Neutro Abs: 4.6 10*3/uL (ref 1.4–7.7)
Platelets: 239 10*3/uL (ref 150.0–400.0)
RBC: 3.82 Mil/uL — AB (ref 3.87–5.11)
RDW: 13.2 % (ref 11.5–15.5)
WBC: 7.4 10*3/uL (ref 4.0–10.5)

## 2016-09-03 LAB — HEPATIC FUNCTION PANEL
ALK PHOS: 96 U/L (ref 39–117)
ALT: 23 U/L (ref 0–35)
AST: 22 U/L (ref 0–37)
Albumin: 4.3 g/dL (ref 3.5–5.2)
BILIRUBIN DIRECT: 0.1 mg/dL (ref 0.0–0.3)
TOTAL PROTEIN: 6.9 g/dL (ref 6.0–8.3)
Total Bilirubin: 0.4 mg/dL (ref 0.2–1.2)

## 2016-09-03 LAB — BASIC METABOLIC PANEL
BUN: 16 mg/dL (ref 6–23)
CHLORIDE: 102 meq/L (ref 96–112)
CO2: 27 meq/L (ref 19–32)
Calcium: 9.6 mg/dL (ref 8.4–10.5)
Creatinine, Ser: 1.11 mg/dL (ref 0.40–1.20)
GFR: 52.47 mL/min — ABNORMAL LOW (ref >60.00)
Glucose, Bld: 138 mg/dL — ABNORMAL HIGH (ref 70–99)
Potassium: 3.8 mEq/L (ref 3.5–5.1)
SODIUM: 140 meq/L (ref 135–145)

## 2016-09-03 NOTE — Telephone Encounter (Signed)
Pt requesting asap appt- c/o increased sob, chest discomfort.  Recently treated for pna, concerned that her symptoms are returning to how she felt when she was dx'ed with pna.   Scheduled for today at 4:00 with AD.  Nothing further needed.

## 2016-09-03 NOTE — Patient Instructions (Signed)
  It was a pleasure taking care of you today!  We will obtain a chest X ray as well as blood work. We will call you with results.   Follow up with Dr. Elsworth Soho as scheduled.

## 2016-09-03 NOTE — Progress Notes (Signed)
Subjective:    Patient ID: Tiffany [name], female    DOB: [birth year], 66 y.o.   MRN: [id number]  HPI   Patient is being seen by Dr. Elsworth Soho for bronchiectasis.  She is here today for an acute visit/follow up visit.  She had increased SOB and cough on 08/16/16.  Sx persisted with green sputum.  She was prescribed augmentin BID x 10 days. She finished her meds last week.  She is better with less cough.  Use flutter valve,  Has not used Proair recently. Has GERD and is stable. Denies UACS. She is better but not at baseline.  Her HR and BP are high.     Review of Systems  Constitutional: Negative.   HENT: Negative.   Eyes: Negative.   Respiratory: Positive for cough and shortness of breath.   Cardiovascular: Negative.   Gastrointestinal: Negative.   Endocrine: Negative.   Genitourinary: Negative.   Musculoskeletal: Negative.   Skin: Negative.   Allergic/Immunologic: Negative.   Neurological: Negative.   Psychiatric/Behavioral: Negative.   All other systems reviewed and are negative.      Objective:   Physical Exam  Vitals:  Vitals:   09/03/16 1604  BP: (!) 150/98  Pulse: (!) 115  SpO2: 95%  Weight: 176 lb 12.8 oz (80.2 kg)  Height: 5\' 3"  (1.6 m)    Constitutional/General:  Pleasant, well-nourished, well-developed, not in any distress,  Comfortably seating.  Well kempt  Body mass index is 31.32 kg/m. Wt Readings from Last 3 Encounters:  09/03/16 176 lb 12.8 oz (80.2 kg)  08/22/16 172 lb (78 kg)  06/20/16 172 lb (78 kg)    HEENT: Pupils equal and reactive to light and accommodation. Anicteric sclerae. Normal nasal mucosa.   No oral  lesions,  mouth clear,  oropharynx clear, no postnasal drip. (-) Oral thrush. No dental caries.  Airway - Mallampati class III  Neck: No masses. Midline trachea. No JVD, (-) LAD. (-) bruits appreciated.  Respiratory/Chest: Grossly normal chest. (-) deformity. (-) Accessory muscle use.  Symmetric expansion. (-) Tenderness on  palpation.  Resonant on percussion.  Diminished BS on both lower lung zones. (-) wheezing,rhonchi. Some crackles at bases.  (-) egophony  Cardiovascular: Regular rate and  rhythm, heart sounds normal, no murmur or gallops, no peripheral edema  Gastrointestinal:  Normal bowel sounds. Soft, non-tender. No hepatosplenomegaly.  (-) masses.   Musculoskeletal:  Normal muscle tone. Normal gait.   Extremities: Grossly normal. (-) clubbing, cyanosis.  (-) edema  Skin: (-) rash,lesions seen.   Neurological/Psychiatric : alert, oriented to time, place, person. Normal mood and affect          Assessment & Plan:  Pneumonia Pt with bronchiectasis with recent SOB, cough and CXR with LLL PNA.  She took augmentin BID x  2weeks. Pt improved but not at baseline. Less cough.  Her HR and BP are elevated.  Denies taking other meds.  She has mild CKD at baseline.  She has chronic diarrhea which is not worse.   Plan : 1. CXR, CMP now.  If CXR is worse, may need IV Abx.  Need to check if creatinine is worse.  2.   Told pt to go to ED if she gets worse.   Bronchiectasis with acute exacerbation (HCC) S/P augmentin for acute flare with LLL infiltrate.     Return to clinic as scheduled.   Monica Becton, MD 09/04/2016, 5:10 AM [facility] Pager [phone]  After 3 pm or if no answer, call [phone]   a

## 2016-09-04 DIAGNOSIS — J189 Pneumonia, unspecified organism: Secondary | ICD-10-CM | POA: Insufficient documentation

## 2016-09-04 NOTE — Assessment & Plan Note (Signed)
S/P augmentin for acute flare with LLL infiltrate.

## 2016-09-04 NOTE — Assessment & Plan Note (Signed)
Pt with bronchiectasis with recent SOB, cough and CXR with LLL PNA.  She took augmentin BID x  2weeks. Pt improved but not at baseline. Less cough.  Her HR and BP are elevated.  Denies taking other meds.  She has mild CKD at baseline.  She has chronic diarrhea which is not worse.   Plan : 1. CXR, CMP now.  If CXR is worse, may need IV Abx.  Need to check if creatinine is worse.  2.   Told pt to go to ED if she gets worse.

## 2016-09-09 ENCOUNTER — Ambulatory Visit: Payer: Medicare Other | Admitting: Neurology

## 2016-09-09 ENCOUNTER — Ambulatory Visit: Payer: Medicare Other | Admitting: Internal Medicine

## 2016-09-23 ENCOUNTER — Other Ambulatory Visit: Payer: Self-pay | Admitting: Internal Medicine

## 2016-09-23 DIAGNOSIS — Z1231 Encounter for screening mammogram for malignant neoplasm of breast: Secondary | ICD-10-CM

## 2016-11-11 ENCOUNTER — Ambulatory Visit
Admission: RE | Admit: 2016-11-11 | Discharge: 2016-11-11 | Disposition: A | Discharge status: Home / Self Care | Payer: Medicare Other | Source: Ambulatory Visit | Attending: Internal Medicine | Admitting: Internal Medicine

## 2016-11-11 DIAGNOSIS — Z1231 Encounter for screening mammogram for malignant neoplasm of breast: Secondary | ICD-10-CM

## 2017-01-08 ENCOUNTER — Other Ambulatory Visit: Payer: Self-pay | Admitting: Internal Medicine

## 2017-01-08 DIAGNOSIS — R1011 Right upper quadrant pain: Secondary | ICD-10-CM

## 2017-01-15 ENCOUNTER — Ambulatory Visit
Admission: RE | Admit: 2017-01-15 | Discharge: 2017-01-15 | Disposition: A | Discharge status: Home / Self Care | Payer: Medicare Other | Source: Ambulatory Visit | Attending: Internal Medicine | Admitting: Internal Medicine

## 2017-01-15 DIAGNOSIS — R1011 Right upper quadrant pain: Secondary | ICD-10-CM

## 2017-02-06 ENCOUNTER — Other Ambulatory Visit: Payer: Self-pay

## 2017-02-06 ENCOUNTER — Telehealth: Payer: Self-pay | Admitting: Internal Medicine

## 2017-02-06 ENCOUNTER — Other Ambulatory Visit: Payer: Medicare Other

## 2017-02-06 DIAGNOSIS — R197 Diarrhea, unspecified: Secondary | ICD-10-CM

## 2017-02-06 MED ORDER — METRONIDAZOLE 500 MG PO TABS: 500.0000 mg | ORAL_TABLET | Freq: Three times a day (TID) | ORAL | 0 refills | Status: DC

## 2017-02-06 NOTE — Telephone Encounter (Signed)
Pt states she has IBS-D and that she has been having diarrhea for a while. Reports she cannot take lomotil because it makes her hallucinate. Pt states Dr. Henrene Pastor gave her a prescription for Flagyl in the past that helped with the diarrhea. Pt would like to have another script for Flagyl. Dr. Silverio Decamp as doc of the day please advise.

## 2017-02-06 NOTE — Telephone Encounter (Signed)
Please advise patient to come in to check stool C.diff, GI pathogen panel. Ok to send Rx for Flagyl 500mg  TID X 10 days. If no improvement of symptoms, will need office visit . Thanks

## 2017-02-06 NOTE — Telephone Encounter (Signed)
Spoke with pt and she is aware.order in epic and script sent to pharmacy.

## 2017-02-13 ENCOUNTER — Other Ambulatory Visit: Payer: Medicare Other

## 2017-02-13 DIAGNOSIS — R197 Diarrhea, unspecified: Secondary | ICD-10-CM

## 2017-02-14 LAB — CLOSTRIDIUM DIFFICILE BY PCR: Toxigenic C. Difficile by PCR: NOT DETECTED

## 2017-04-24 ENCOUNTER — Ambulatory Visit (INDEPENDENT_AMBULATORY_CARE_PROVIDER_SITE_OTHER)
Admission: RE | Admit: 2017-04-24 | Discharge: 2017-04-24 | Disposition: A | Discharge status: Home / Self Care | Payer: Medicare Other | Source: Ambulatory Visit | Attending: Acute Care | Admitting: Acute Care

## 2017-04-24 ENCOUNTER — Telehealth: Payer: Self-pay | Admitting: Acute Care

## 2017-04-24 ENCOUNTER — Ambulatory Visit (INDEPENDENT_AMBULATORY_CARE_PROVIDER_SITE_OTHER): Payer: Medicare Other | Admitting: Acute Care

## 2017-04-24 ENCOUNTER — Encounter: Payer: Self-pay | Admitting: Acute Care

## 2017-04-24 VITALS — BP 126/66 | HR 77 | Ht 63.0 in | Wt 176.0 lb

## 2017-04-24 DIAGNOSIS — J471 Bronchiectasis with (acute) exacerbation: Secondary | ICD-10-CM

## 2017-04-24 MED ORDER — AMOXICILLIN-POT CLAVULANATE 875-125 MG PO TABS: 1.0000 | ORAL_TABLET | Freq: Two times a day (BID) | ORAL | 0 refills | Status: DC

## 2017-04-24 NOTE — Assessment & Plan Note (Signed)
Mild flare of bronchiectasis Plan We will do a CXR today. We will call you with results Augmentin 875 mg take one pill twice daily  X 10 days - take at breakfast and supper with large glass of water.   It would help reduce the usual side effects (diarrhea and yeast infections) if you ate cultured yogurt at lunch.  Add Mucinex 1200 mg once daily with a full glass of water. Flutter valve 4 times a day ( 4-5 puffs each time) Follow up with Sukhmani Fetherolf or Dr. Elsworth Soho in 3 weeks. Follow up with PCP regarding fatigue. Please contact office for sooner follow up if symptoms do not improve or worsen or seek emergency care

## 2017-04-24 NOTE — Progress Notes (Addendum)
History of Present Illness Tiffany [name] is a 66 y.o. female never smoker with Bronchiectasis. She is followed by Dr. Elsworth Soho.She is a retired Marine scientist.   04/24/2017 Acute OV: Pt. Presents for an acute visit for chest  congestion with productive cough with green mucus. She has had increasing mucus production and change in mucus color. She states this started 04/23/2017. She denies fever. She is currently not using her flutter valve.  She is currently not using Mucinex. She states she has had some body aches.She states she also has had fatigue for several months. She denies fever, chest pain, orthopnea, or hemoptysis.   Test Results:  Chest x-ray 04/24/2017 There is no acute cardiopulmonary abnormality. There is stable scarring in the left mid lung. Mild stable lung markings in the left lower lobe likely reflect known bronchiectasis.  CBC Latest Ref Rng & Units 09/03/2016 08/19/2016 05/05/2015  WBC 4.0 - 10.5 K/uL 7.4 3.9(L) 6.2  Hemoglobin 12.0 - 15.0 g/dL 12.0 12.1 11.8(L)  Hematocrit 36.0 - 46.0 % 34.7(L) 37.0 35.3(L)  Platelets 150.0 - 400.0 K/uL 239.0 146(L) 204.0    BMP Latest Ref Rng & Units 09/03/2016 08/19/2016 10/12/2014  Glucose 70 - 99 mg/dL 138(H) 216(H) 128(H)  BUN 6 - 23 mg/dL 16 16 11   Creatinine 0.40 - 1.20 mg/dL 1.11 1.27(H) 1.18(H)  Sodium 135 - 145 mEq/L 140 138 140  Potassium 3.5 - 5.1 mEq/L 3.8 3.6 4.7  Chloride 96 - 112 mEq/L 102 103 104  CO2 19 - 32 mEq/L 27 26 32  Calcium 8.4 - 10.5 mg/dL 9.6 10.0 8.2(L)     ProBNP    Component Value Date/Time   PROBNP 22.1 11/09/2013 1434      Past medical hx Past Medical History:  Diagnosis Date  . Allergy   . Anemia    pernicious anemia- recieved iron infusions until 10-2015  . Anginal pain ([place])   . Anxiety   . Arthritis    foot drop  lt foot  / toes   wears brace     . Bronchiectasis ([place])   . Cancer ([place])    SKIN    . Cataract    REMOVED  . Chronic cough   . Colon polyps   . Depression   . Diabetes  mellitus   . Dysphagia   . Dysrhythmia    RAPID HEART BEAT , CHEST TIGHTNESS  09/29/14     . Gastritis   . GERD (gastroesophageal reflux disease)   . Hiatal hernia   . Hyperlipidemia   . Hypertension   . IBS (irritable bowel syndrome)   . Insomnia   . Kidney stones    KIDNEY DISEASE   STAGE lll    . Kidney stones   . Macular disorder    MACULAR DISEASE  RT EYE   . Meningitis   . Osteoporosis   . Pneumonia    BRONCHIECTASIS  . Shortness of breath dyspnea    WITH EXERTION  . Sleep apnea    uses cpap  . Spina bifida occulta   . Ulcer      Social History  Substance Use Topics  . Smoking status: Never Smoker  . Smokeless tobacco: Never Used  . Alcohol use No     Comment: OCC.    Ms.Lapid reports that she has never smoked. She has never used smokeless tobacco. She reports that she does not drink alcohol or use drugs.  Tobacco Cessation: Never smoker  Past surgical hx, Family hx, Social  hx all reviewed.  Current Outpatient Prescriptions on File Prior to Visit  Medication Sig  . acetaminophen (TYLENOL) 500 MG tablet Take 1,000 mg by mouth every 8 (eight) hours as needed for mild pain or moderate pain.  . Aflibercept (EYLEA IO) Inject into the eye every 3 (three) months.  Marland Kitchen amLODipine (NORVASC) 2.5 MG tablet Take 2.5 mg by mouth daily.  Marland Kitchen atorvastatin (LIPITOR) 40 MG tablet Take 1 tablet (40 mg total) by mouth daily.  . Calcium Carbonate-Vit D-Min (CALCIUM 1200 PO) Take 1 tablet by mouth daily.  . Cholecalciferol (VITAMIN D) 2000 UNITS CAPS Take 2 capsules by mouth daily.  . diazepam (VALIUM) 10 MG tablet Take 10 mg by mouth at bedtime.  . DULoxetine (CYMBALTA) 60 MG capsule Take 60 mg by mouth daily.  Marland Kitchen EPIPEN 2-PAK 0.3 MG/0.3ML SOAJ injection as needed.  . ferric gluconate (NULECIT) 12.5 MG/ML injection Inject 5 mLs (62.5 mg total) into the vein once.  . furosemide (LASIX) 40 MG tablet Take 40 mg by mouth daily as needed for fluid or edema. Reported on 03/07/2016  .  Loperamide HCl (IMODIUM PO) Take by mouth as needed.  . loratadine (CLARITIN) 10 MG tablet Take 10 mg by mouth daily as needed for allergies.   Marland Kitchen meclizine (ANTIVERT) 25 MG tablet Take 25 mg by mouth every 6 (six) hours as needed.  . metFORMIN (GLUCOPHAGE) 500 MG tablet Take 500 mg by mouth 2 (two) times daily.   . metoprolol succinate (TOPROL-XL) 50 MG 24 hr tablet Take 100 mg by mouth daily.   . metroNIDAZOLE (FLAGYL) 500 MG tablet Take 1 tablet (500 mg total) by mouth 3 (three) times daily.  . ONE TOUCH ULTRA TEST test strip as needed.  Glory Rosebush DELICA LANCETS 41D MISC as directed.  Vladimir Faster Glycol-Propyl Glycol (SYSTANE ULTRA) 0.4-0.3 % SOLN Apply 1-2 drops to eye daily.  . Probiotic Product (ALIGN) 4 MG CAPS Take 1 capsule by mouth daily as needed (IBS).   Marland Kitchen promethazine (PHENERGAN) 25 MG tablet Take 25 mg by mouth every 6 (six) hours as needed for nausea or vomiting.   Marland Kitchen Respiratory Therapy Supplies (FLUTTER) DEVI Use as directed  . tobramycin (TOBREX) 0.3 % ophthalmic solution Place 1 drop into the right eye every 6 (six) hours. The day before, the day of, and the after procedure.  Marland Kitchen VITAMIN B1-B12 IJ Inject as directed every 30 (thirty) days.   Current Facility-Administered Medications on File Prior to Visit  Medication  . 0.9 %  sodium chloride infusion     Allergies  Allergen Reactions  . Ambien [Zolpidem Tartrate] Other (See Comments)    Nightmares and insomnia  . Aspirin Other (See Comments)    GI bleed Other Reaction: OTHER REACTION GI bleed  . Benicar [Olmesartan Medoxomil] Shortness Of Breath, Swelling and Other (See Comments)    Swelling of throat  . Codeine Nausea And Vomiting  . Erythromycin Nausea And Vomiting  . Ketorolac Anaphylaxis  . Ketorolac Tromethamine Anaphylaxis  . Latex Shortness Of Breath and Itching  . Lisinopril Shortness Of Breath, Swelling and Other (See Comments)     Swelling of throat  Swelling of throat  . Olmesartan Other (See  Comments), Shortness Of Breath and Swelling    Swelling of throat  . Other Anaphylaxis    Peaches and mangoes, tree nuts, tomatoes, shellfish, Cat dandor, green peas Peaches and mangoes, tree nuts, tomatoes, shellfish, Cat dandor, green peas  . Reclast [Zoledronic Acid] Other (See Comments)  Ocular reaction, toxic Ocular reaction, toxic  . Toradol [Ketorolac Tromethamine] Anaphylaxis  . Zolpidem Other (See Comments)    Nightmares and insomnia  . Fenofibrate Nausea And Vomiting  . Glucosamine Diarrhea and Nausea Only  . Glucosamine Forte [Nutritional Supplements] Diarrhea and Nausea Only  . Levaquin [Levofloxacin In D5w] Diarrhea and Swelling  . Levofloxacin Diarrhea and Swelling  . Percocet [Oxycodone-Acetaminophen] Nausea And Vomiting  . Tramadol Nausea And Vomiting  . Lomotil [Diphenoxylate-Atropine]     Hallucinations   . Tizanidine Nausea And Vomiting and Other (See Comments)    unknown unknown    Review Of Systems:  Constitutional:   No  weight loss, night sweats,  Fevers, chills, +fatigue, or  lassitude.  HEENT:   No headaches,  Difficulty swallowing,  Tooth/dental problems, or  Sore throat,                No sneezing, itching, ear ache, nasal congestion, post nasal drip,   CV:  No chest pain,  Orthopnea, PND, swelling in lower extremities, anasarca, dizziness, palpitations, syncope.   GI  No heartburn, indigestion, abdominal pain, nausea, vomiting, diarrhea, change in bowel habits, loss of appetite, bloody stools.   Resp: No shortness of breath with exertion or at rest.  + excess mucus, + productive cough,  No non-productive cough,  No coughing up of blood.  + change in color of mucus.  No wheezing.  No chest wall deformity  Skin: no rash or lesions.  GU: no dysuria, change in color of urine, no urgency or frequency.  No flank pain, no hematuria   MS:  No joint pain or swelling.  No decreased range of motion.  No back pain.  Psych:  No change in mood or affect.  No depression or anxiety.  No memory loss.   Vital Signs BP 126/66 (BP Location: Right Arm, Cuff Size: Normal)   Pulse 77   Ht 5\' 3"  (1.6 m)   Wt 176 lb (79.8 kg)   SpO2 100%   BMI 31.18 kg/m    Physical Exam:  General- No distress,  A&Ox3, appropriate ENT: No sinus tenderness, TM clear, pale nasal mucosa, no oral exudate,no post nasal drip, no LAN Cardiac: S1, S2, regular rate and rhythm, no murmur Chest: No wheeze/ rales/ dullness; no accessory muscle use, no nasal flaring, no sternal retractions Abd.: Soft Non-tender Ext: No clubbing cyanosis, edema Neuro:  normal strength Skin: No rashes, warm and dry Psych: normal mood and behavior   Assessment/Plan  Bronchiectasis with acute exacerbation (HCC) Mild flare of bronchiectasis Plan We will do a CXR today. We will call you with results Augmentin 875 mg take one pill twice daily  X 10 days - take at breakfast and supper with large glass of water.   It would help reduce the usual side effects (diarrhea and yeast infections) if you ate cultured yogurt at lunch.  Add Mucinex 1200 mg once daily with a full glass of water. Flutter valve 4 times a day ( 4-5 puffs each time) Follow up with Dellis Voght or Dr. Elsworth Soho in 3 weeks. Follow up with PCP regarding fatigue. Please contact office for sooner follow up if symptoms do not improve or worsen or seek emergency care   Healthcare maintenance Flu shot upon follow-up once current flare has resolved  Magdalen Spatz, NP 04/24/2017  4:08 PM

## 2017-04-24 NOTE — Patient Instructions (Addendum)
It is nice to meet you today. We will do a CXR today. We will call you with results Augmentin 875 mg take one pill twice daily  X 10 days - take at breakfast and supper with large glass of water.   It would help reduce the usual side effects (diarrhea and yeast infections) if you ate cultured yogurt at lunch.  Add Mucinex 1200 mg once daily with a full glass of water. Flutter valve 4 times a day ( 4-5 puffs each time) Follow up with Devante Capano or Dr. Elsworth Soho in 3 weeks. Follow up with PCP regarding fatigue. Please contact office for sooner follow up if symptoms do not improve or worsen or seek emergency care

## 2017-04-24 NOTE — Telephone Encounter (Signed)
atc pt, went straight to VM.  lmtcb X1 for pt.   rx was sent to mail order pharmacy instead of local pharmacy.  This has been sent to [facility] on [address] as requested.

## 2017-04-25 NOTE — Telephone Encounter (Signed)
Received call from Viral with [facility] in [place] -- states that they received an Augmentin Rx that was most likely meant for the patient's local pharmacy. I advised him to cancel this Rx as it was supposed to be filled with [facility] locally.

## 2017-04-25 NOTE — Telephone Encounter (Signed)
Called and lmomtcb x 2 for the pt.

## 2017-04-30 ENCOUNTER — Telehealth: Payer: Self-pay | Admitting: Acute Care

## 2017-04-30 NOTE — Telephone Encounter (Signed)
Spoke with pt. States that she is not feeling any better since seeing SG on 04/24/17. Reports increased coughing and weakness. Cough is producing green mucus. Per SG >> pt needs to be re-evaluated. Pt has been scheduled to see RA on 05/01/17 at 11:15am. Nothing further was needed.

## 2017-05-01 ENCOUNTER — Other Ambulatory Visit (INDEPENDENT_AMBULATORY_CARE_PROVIDER_SITE_OTHER): Payer: Medicare Other

## 2017-05-01 ENCOUNTER — Ambulatory Visit (INDEPENDENT_AMBULATORY_CARE_PROVIDER_SITE_OTHER): Payer: Medicare Other | Admitting: Pulmonary Disease

## 2017-05-01 ENCOUNTER — Encounter: Payer: Self-pay | Admitting: Pulmonary Disease

## 2017-05-01 VITALS — BP 110/70 | HR 75 | Ht 63.0 in | Wt 176.2 lb

## 2017-05-01 DIAGNOSIS — R1314 Dysphagia, pharyngoesophageal phase: Secondary | ICD-10-CM | POA: Diagnosis not present

## 2017-05-01 DIAGNOSIS — J471 Bronchiectasis with (acute) exacerbation: Secondary | ICD-10-CM

## 2017-05-01 LAB — CBC WITH DIFFERENTIAL/PLATELET
BASOS ABS: 0.1 10*3/uL (ref 0.0–0.1)
Basophils Relative: 1 % (ref 0.0–3.0)
EOS ABS: 0.2 10*3/uL (ref 0.0–0.7)
EOS PCT: 2.6 % (ref 0.0–5.0)
HCT: 35.2 % — ABNORMAL LOW (ref 36.0–46.0)
HEMOGLOBIN: 11.9 g/dL — AB (ref 12.0–15.0)
LYMPHS ABS: 2 10*3/uL (ref 0.7–4.0)
Lymphocytes Relative: 28.4 % (ref 12.0–46.0)
MCHC: 33.7 g/dL (ref 30.0–36.0)
MCV: 92.4 fl (ref 78.0–100.0)
MONO ABS: 0.5 10*3/uL (ref 0.1–1.0)
Monocytes Relative: 7.6 % (ref 3.0–12.0)
NEUTROS PCT: 60.4 % (ref 43.0–77.0)
Neutro Abs: 4.4 10*3/uL (ref 1.4–7.7)
Platelets: 193 10*3/uL (ref 150.0–400.0)
RBC: 3.82 Mil/uL — AB (ref 3.87–5.11)
RDW: 13.3 % (ref 11.5–15.5)
WBC: 7.2 10*3/uL (ref 4.0–10.5)

## 2017-05-01 LAB — BASIC METABOLIC PANEL
BUN: 18 mg/dL (ref 6–23)
CALCIUM: 9.7 mg/dL (ref 8.4–10.5)
CHLORIDE: 100 meq/L (ref 96–112)
CO2: 31 meq/L (ref 19–32)
Creatinine, Ser: 1.24 mg/dL — ABNORMAL HIGH (ref 0.40–1.20)
GFR: 46.08 mL/min — ABNORMAL LOW (ref >60.00)
GLUCOSE: 124 mg/dL — AB (ref 70–99)
POTASSIUM: 4.2 meq/L (ref 3.5–5.1)
SODIUM: 141 meq/L (ref 135–145)

## 2017-05-01 NOTE — Patient Instructions (Signed)
Blood work today. Based on this we'll decide duration of antibiotics  In the future, we may consider swallowing test to evaluate her esophagus  Your bronchiectasis is mild

## 2017-05-01 NOTE — Assessment & Plan Note (Signed)
Blood work today. Based on this we'll decide duration of antibiotics -may need longer duration on Augmentin  In the future, we may consider swallowing test to evaluate her esophagus

## 2017-05-01 NOTE — Progress Notes (Signed)
   Subjective:    Patient ID: Tiffany [name], female    DOB: May 01, 1951, 66 y.o.   MRN: [id number]  HPI  Chief Complaint  Patient presents with  . Acute Visit    Pt was at the office 04/24/17 seeing SG and states that she is not any better,  maybe even worse. Pt is still on augmentin and is still using her flutter device but states that it is not helping. Pt c/o occ. SOB, cough with green mucus, and chest tightness. Pt states that she has been having issues with becomng lightheaded easily recently.    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 She also gets B-12 shots. 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 The small 3 mm left lower lobe nodule noted on the prior examination has completely resolved. The larger nodule in the right lung much smaller    Review of Systems neg for any significant sore throat, dysphagia, itching, sneezing, nasal congestion  or excess/ purulent secretions, fever, chills, sweats, unintended wt loss, pleuritic or exertional cp, hempoptysis, orthopnea pnd or change in chronic leg swelling. Also denies presyncope, palpitations, heartburn, abdominal pain, nausea, vomiting, diarrhea or change in bowel or urinary habits, dysuria,hematuria, rash, arthralgias, visual complaints, headache, numbness weakness or ataxia.     Objective:   Physical Exam  Gen. Pleasant, well-nourished, in no distress ENT - no thrush, no post nasal drip Neck: No JVD, no thyromegaly, no carotid bruits Lungs: no use of accessory muscles, no dullness to percussion, clear without rales or rhonchi  Cardiovascular: Rhythm regular, heart sounds  normal, no murmurs or gallops, no peripheral edema Musculoskeletal: No deformities, no cyanosis or clubbing , Slow to ambulate        Assessment & Plan:

## 2017-05-01 NOTE — Assessment & Plan Note (Signed)
Consider barium swallow to assess for aspiration risk

## 2017-05-14 ENCOUNTER — Ambulatory Visit: Payer: Medicare Other | Admitting: Adult Health

## 2017-05-14 ENCOUNTER — Ambulatory Visit: Payer: Medicare Other | Admitting: Acute Care

## 2017-07-16 ENCOUNTER — Ambulatory Visit: Payer: Medicare Other | Admitting: Neurology

## 2017-08-11 ENCOUNTER — Telehealth: Payer: Self-pay | Admitting: Pulmonary Disease

## 2017-08-11 MED ORDER — AMOXICILLIN-POT CLAVULANATE 875-125 MG PO TABS: 1.0000 | ORAL_TABLET | Freq: Two times a day (BID) | ORAL | 0 refills | Status: DC

## 2017-08-11 NOTE — Telephone Encounter (Signed)
Augmentin 875 twice daily into 7 days

## 2017-08-11 NOTE — Telephone Encounter (Signed)
Called and spoke with pt. Pt reports of head/chest congestion, prod cough with green mucus, chills, night sweats & chest tightness x2d Pt taken mucinex with no relief.  RA please advise. Thanks.

## 2017-08-11 NOTE — Telephone Encounter (Signed)
Pt aware of recs.  rx sent to preferred pharmacy.  Nothing further needed.  

## 2017-09-09 ENCOUNTER — Encounter: Payer: Self-pay | Admitting: Neurology

## 2017-09-09 ENCOUNTER — Ambulatory Visit: Payer: Medicare Other | Admitting: Neurology

## 2017-09-09 VITALS — BP 121/77 | HR 80 | Ht 63.0 in | Wt 180.5 lb

## 2017-09-09 DIAGNOSIS — M21372 Foot drop, left foot: Secondary | ICD-10-CM | POA: Diagnosis not present

## 2017-09-09 DIAGNOSIS — R2689 Other abnormalities of gait and mobility: Secondary | ICD-10-CM | POA: Insufficient documentation

## 2017-09-09 DIAGNOSIS — G4733 Obstructive sleep apnea (adult) (pediatric): Secondary | ICD-10-CM

## 2017-09-09 DIAGNOSIS — Z9989 Dependence on other enabling machines and devices: Secondary | ICD-10-CM | POA: Diagnosis not present

## 2017-09-09 DIAGNOSIS — M21371 Foot drop, right foot: Secondary | ICD-10-CM | POA: Diagnosis not present

## 2017-09-09 DIAGNOSIS — Q760 Spina bifida occulta: Secondary | ICD-10-CM | POA: Diagnosis not present

## 2017-09-09 DIAGNOSIS — R159 Full incontinence of feces: Secondary | ICD-10-CM | POA: Diagnosis not present

## 2017-09-09 NOTE — Progress Notes (Signed)
[facility]   Provider:  Larey Seat, M D  Referring Provider: Prince Solian, MD Primary Care Physician:  Prince Solian, MD  Chief Complaint  Patient presents with  . Follow-up    OSA  . Dizziness    new symptom  . Headache    new symptom    HPI: 09-09-2017 ,  Tiffany [name] is a 67 y.o. female , seen here in a scheduled RV , meanwhile on CPAP for OSA, and doing well when using CPAP. she doesn't like the white noise emitted by the machine and resents the mask removal and replacement with each bathroom break.  Here after she spoke to Dr Avva/  she became dizzy and lightheaded in October 2018, was seen by her primary care physician, Dr. Elsworth Soho, who found her BUN and creatinine to be elevated.  She may have been dehydrated but there is also a concern about her kidney function.  He removed Lasix from her medication list which helped to correct the laboratory abnormalities.  She will see [facility]. [facility] next week.  She continues to complain about poor balance, fell out of the bathtub on January 6. Slipped, fell backwards. She cannot get back up without help. She was not injured.  I see her today upon Dr. Danna Hefty  request because she had not responded with improvement in dizziness after Epley maneuvers and meclizine prescription.  She however describes her dizziness not as vertigo.  She uses a term lack of balance or lightheadedness, and she assures me that she hydrates well. Her leg is not responding- she wears a  Left leg brace -"  forever".   She has stool incontinence for several year now - attributed to IBS and colitis and likely a neurological component is part-complex occult spinal dysraphism".  The stool incontince does prevent her form going swimming. She has no urinary incontinence.  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   Original encounter: CD  The patient was seen here in 2013 a referral from Dr. Dagmar Hait for  a re- evaluation of spina bifida, Chief complaint according to patient : Tiffany [name] has a history of speed RBC to but was never diagnosed with a tethered cord,  she presented after a fall in 2010 upon request of her neurosurgeon Dr. Kristeen Miss.  I saw her on 01-30-09. She characterized her discomfort as pain "shooting from the lowest spine upwards to shoulder and neck area" and use the term "brain freeze" to describe the sensation. The patient's history begins with meningitis and the year 1964. In the year 1972 she underwent a tonsillectomy, she had a ruptured left eardrum repair in 1979, partial hysterectomy 1989, in 1991 she underwent surgery for spina bifida at [facility] under the guidance of Dr. Tommie Raymond. 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 She had a ventral meningocele / ventral band.  During our visit in 01-30-09 she had no urinary current incontinence but had bowel incontinence already. She has microhematuria, developed kidney stones repeatedly. She had an unremarkable pregnancy. No neurological new symptoms for almost a decade until 1990 when she developed a foot drop.  In April 2001 she underwent  Fundoplication  surgery, and several " oesophageal stretchings" . Dr. Emiliano Dyer ( orthopedic surgeon)  then referred her to Dr. Erling Cruz who referred them to Dr. Quentin Cornwall and Ellene Route. Precipitated by a fall downstairs. She was immediately referred to [facility] and Dr. Faith Rogue worked on her spinal bifida. She return to work in 2 months. She had sometimes used a walker but mostly can do with a cane, since 2002 she wore braces he uses them to prevent a foot drop. She was last  hospitalized in March 2015 for a pneumonia refractory to outpatient management. She was in respiratory distress at the time, hypoxemic had bronchiectasis, multiple pulmonary nodules. She has now progressive ascending numbness to the thigh. She underwent total knee replacement of the right knee in 2014.She has noted the right foot being stuck on the foot pedal when driving. She has been worked up by Dr. Dagmar Hait and diagnosed with a B 12 deficiency, in addition to the known spida bifida. She has declining vision and takes injections into the right eye, has a grey haze over her eyes. Dx: Macular degeneration.   Interval history from the 2-13- 2017. Tiffany [name] is here today for a follow-up visit she endorsed 3 points on her geriatric depression score, which is a negligible score fatigue severity is high at 59, Epworth sleepiness score is average at 8 points. She was originally referred by Dr. Dagmar Hait for right distal foot weakness. Dr. Jaynee Eagles my colleague specialized neuromuscular diseases, evaluated her in a EMG nerve conduction study and found no evidence of radiculopathy or neuropathy.   She has a history of spina bifida which would explain her right distal foot weakness even if there is no injury to the nerve as  the nerve may have never developed. She  has numbness, too. Burning tongue.   Plan: PSG SPLIT in 2017, Basically, she has fatigue possibly  for other reasons but not necessarily for a sleep related disorder. Rv in March/ April  after SPLIT.  PICA and burning mouth- check iron and check B12 vitamin. Take Vit D.  Interval history from 03/07/2016. Tiffany [name] has been successfully treated with CPAP after a sleep study on 11/27/2015 revealed apnea with an AHI during REM sleep of 42.2 and in supine sleep of 10.8 the overall AHI was only 8.7 but was felt to contribute to the patient's excessive daytime fatigue rather than  sleepiness.  Given her extensive past medical history I decided on a CPAP titration  which followed on 12/18/2015, the patient is now using an auto CPAP between 4 and 14 cm water and a small nasal pillow interface. We also able today to see the compliance which is 100% - the patient is using the machine on average 7 hours and 41 minutes at night, the minimum pressure is for the maximum pressure of 14 cm water with full-time EPR of 3 cm.  Residual AHI is 0.5 the 95%  pressure is only 6.5 cm water . Based on this result I would like for her to continue using the CPAP, it has been influential on reducing her fatigue but she had endorsed at her fatigue severity score of 59 during her last visits. In addition her burning mouth symptoms have been improved on gargling with a solution of warm water and several drops of to [place], this capsaicin mouthwash relieved the pain within 10-12 days. Her pica is much improved with iron deficiency treatment and she received a round of infusions of iron. It has given her energy, and Dr. Dagmar Hait  is following her anemia levels.    Review of Systems:Out of a complete 14 system review, the patient complains of only the following symptoms, and all other reviewed systems are negative. 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 Epworth score 2 from 8, FSS is now 53 from  35! More fatigue after she became less compliant CPAP use Depression zero.   Social History   Socioeconomic History  . Marital status: Married    Spouse name: Not on file  . Number of children: 2  . Years of education: Not on file  . Highest education level: Not on file  Social Needs  . Financial resource strain: Not on file  . Food insecurity - worry: Not on file  . Food insecurity - inability: Not on file  . Transportation needs - medical: Not on file  . Transportation needs - non-medical: Not on file  Occupational History  . Occupation: Retired    Comment: LPN  Tobacco Use  . Smoking  status: Never Smoker  . Smokeless tobacco: Never Used  Substance and Sexual Activity  . Alcohol use: No    Comment: OCC.  . Drug use: No  . Sexual activity: Not on file  Other Topics Concern  . Not on file  Social History Narrative  . Not on file    Family History  Problem Relation Age of Onset  . Alzheimer's disease Father   . Irritable bowel syndrome Father   . Ulcers Father   . Gout Father   . Cancer Mother        ?  . Diabetes Mother   . Hypertension Mother   . Nephrolithiasis Mother   . Colon cancer Mother   . Ovarian cancer Mother   . Coronary artery disease Unknown        grandparent  . Thyroid cancer Sister   . Nephrolithiasis Brother   . Colon cancer Maternal Grandmother   . Esophageal cancer Cousin   . Esophageal cancer Cousin   . Colon polyps Neg Hx   . Rectal cancer Neg Hx   . Stomach cancer Neg Hx     Past Medical History:  Diagnosis Date  . Allergy   . Anemia    pernicious anemia- recieved iron infusions until 10-2015  . Anginal pain ([place])   . Anxiety   . Arthritis    foot drop  lt foot  / toes   wears brace     . Bronchiectasis ([place])   . Cancer ([place])    SKIN    . Cataract    REMOVED  . Chronic cough   . Colon polyps   . Depression   . Diabetes mellitus   . Dysphagia   . Dysrhythmia    RAPID HEART BEAT , CHEST TIGHTNESS  09/29/14     . Gastritis   . GERD (gastroesophageal reflux disease)   . Hiatal hernia   . Hyperlipidemia   . Hypertension   . IBS (irritable bowel syndrome)   . Insomnia   . Kidney stones    KIDNEY DISEASE   STAGE lll    . Kidney stones   . Macular disorder    MACULAR DISEASE  RT EYE   . Meningitis   . Osteoporosis   . Pneumonia    BRONCHIECTASIS  . Shortness of breath dyspnea    WITH EXERTION  . Sleep apnea    uses cpap  .  Spina bifida occulta   . Ulcer     Past Surgical History:  Procedure Laterality Date  . ABDOMINAL HYSTERECTOMY    . BACK SURGERY    . CATARACT EXTRACTION  03/2012   bilateral  .  COLONOSCOPY    . eardrum     left   ruptured and repaired  . ELBOW SURGERY     left  . fundoplication    . KNEE ARTHROSCOPY     rt knee   . LITHOTRIPSY    . MENISCUS REPAIR    . POLYPECTOMY    . TONSILLECTOMY    . TOTAL KNEE ARTHROPLASTY Right 10/11/2014   Procedure: RIGHT TOTAL KNEE ARTHROPLASTY;  Surgeon: Mcarthur Rossetti, MD;  Location: [facility];  Service: Orthopedics;  Laterality: Right;    Current Outpatient Medications  Medication Sig Dispense Refill  . acetaminophen (TYLENOL) 500 MG tablet Take 1,000 mg by mouth every 8 (eight) hours as needed for mild pain or moderate pain.    Marland Kitchen amLODipine (NORVASC) 2.5 MG tablet Take 2.5 mg by mouth daily.    Marland Kitchen atorvastatin (LIPITOR) 40 MG tablet Take 1 tablet (40 mg total) by mouth daily. 90 tablet 3  . Calcium Carbonate-Vit D-Min (CALCIUM 1200 PO) Take 1 tablet by mouth daily.    . Cholecalciferol (VITAMIN D) 2000 UNITS CAPS Take 2 capsules by mouth daily.    . diazepam (VALIUM) 10 MG tablet Take 10 mg by mouth at bedtime.    Marland Kitchen EPIPEN 2-PAK 0.3 MG/0.3ML SOAJ injection as needed.  0  . furosemide (LASIX) 40 MG tablet Take 40 mg by mouth daily as needed for fluid or edema. Reported on 03/07/2016    . Loperamide HCl (IMODIUM PO) Take by mouth as needed.    . loratadine (CLARITIN) 10 MG tablet Take 10 mg by mouth daily as needed for allergies.     Marland Kitchen meclizine (ANTIVERT) 25 MG tablet Take 25 mg by mouth every 6 (six) hours as needed.  0  . metoprolol succinate (TOPROL-XL) 50 MG 24 hr tablet Take 100 mg by mouth daily.   1  . ONE TOUCH ULTRA TEST test strip as needed.  1  . ONETOUCH DELICA LANCETS 10R MISC as directed.  1  . Polyethyl Glycol-Propyl Glycol (SYSTANE ULTRA) 0.4-0.3 % SOLN Apply 1-2 drops to eye daily.    . promethazine (PHENERGAN) 25 MG tablet Take 25 mg by mouth every 6 (six) hours as needed for nausea or vomiting.     Marland Kitchen Respiratory Therapy Supplies (FLUTTER) DEVI Use as directed 1 each 0  . VITAMIN B1-B12 IJ Inject as directed  every 30 (thirty) days.     Current Facility-Administered Medications  Medication Dose Route Frequency Provider Last Rate Last Dose  . 0.9 %  sodium chloride infusion  500 mL Intravenous Continuous Irene Shipper, MD        Allergies as of 09/09/2017 - Review Complete 09/09/2017  Allergen Reaction Noted  . Ambien [zolpidem tartrate] Other (See Comments) 09/07/2012  . Aspirin Other (See Comments) 09/07/2012  . Benicar [olmesartan medoxomil] Shortness Of Breath, Swelling, and Other (See Comments) 11/24/2011  . Codeine Nausea And Vomiting   . Erythromycin Nausea And Vomiting   . Ketorolac Anaphylaxis 12/15/2015  . Ketorolac tromethamine Anaphylaxis 11/24/2011  . Latex Shortness Of Breath and Itching 11/09/2013  . Lisinopril Shortness Of Breath, Swelling, and Other (See Comments)   . Olmesartan Other (See Comments), Shortness Of Breath, and Swelling 12/15/2015  . Other Anaphylaxis 11/09/2013  .  Reclast [zoledronic acid] Other (See Comments) 09/07/2012  . Toradol [ketorolac tromethamine] Anaphylaxis 09/07/2012  . Zolpidem Other (See Comments) 12/15/2015  . Fenofibrate Nausea And Vomiting 09/07/2012  . Glucosamine Diarrhea and Nausea Only 12/15/2015  . Glucosamine forte [nutritional supplements] Diarrhea and Nausea Only 09/07/2012  . Levaquin [levofloxacin in d5w] Diarrhea and Swelling 09/07/2012  . Levofloxacin Diarrhea and Swelling 12/15/2015  . Percocet [oxycodone-acetaminophen] Nausea And Vomiting 09/07/2012  . Tramadol Nausea And Vomiting 09/07/2012  . Lomotil [diphenoxylate-atropine]  03/29/2016  . Tizanidine Nausea And Vomiting and Other (See Comments) 09/07/2012    Vitals: BP 121/77   Pulse 80   Ht 5\' 3"  (1.6 m)   Wt 180 lb 8 oz (81.9 kg)   BMI 31.97 kg/m  Last Weight:  Wt Readings from Last 1 Encounters:  09/09/17 180 lb 8 oz (81.9 kg)   VWU:JWJX mass index is 31.97 kg/m.     Last Height:   Ht Readings from Last 1 Encounters:  09/09/17 5\' 3"  (1.6 m)    Physical  exam:  General: The patient is awake, alert and appears not in acute distress. The patient is well groomed. She presents with a single prong cane.  Head: Normocephalic, atraumatic. Neck is supple. Mallampati 3 ,  neck circumference:14. Nasal airflow unrestricted . Retrognathia is seen.  Cardiovascular:  Regular rate and rhythm , without  murmurs or carotid bruit, and without distended neck veins. Respiratory: Lungs are clear to auscultation. Skin:  Without evidence of edema, or rash, or bruising .  Neurologic exam : The patient is awake and alert, oriented to place and time.     Cranial nerves: Pupils are equal and briskly reactive to light. Funduscopic exam with evidence of pallor and edema. Right more than left/  Extraocular movements  in vertical and horizontal planes intact and without nystagmus.  Visual fields by finger perimetry : retricited to the right  Hearing to finger rub intact. Facial sensation intact to fine touch.Facial motor strength is symmetric and tongue and uvula move midline. Shoulder shrug was symmetrical.   Motor exam:  Normal tone, muscle bulk and symmetric strength in upper extremities.  The patient was bilateral FT os which help her with her foot drop.  Left  leg has more pronounce foot drop, She does have a mildly decreased but grip strength bilaterally. Pinch strength is intact and range of motion for hand and fingers is normal.  There is no evidence of atrophy.  Lower extremity hip abductor and flexor musculature is symmetric, he does have some core weakness but her upper extremities are also symmetric in strength tone and muscle bulk.  Do not see ankle edema that would reduce sensation, clearly has weakness of the foot lift on the left, she walks with a cane, with the help of the cane she was able to turn with 3 steps.  She has normal arm swing with the arm that does not carry the cane.  I did not notice any drift, positive Romberg maneuver, she did not take  corrective steps, she braced herself when she rose from the chair, I do not see her as dizzy No evidence of tremor at rest, no cogwheeling.   The patient was advised of the nature of the diagnosed  Gait and sleep disorder , the treatment options and risks for general a health and wellness arising from not treating the condition.  I spent more than 20 minutes of face to face time with the patient.  Greater than 50% of time  was spent in counseling and coordination of care. We have discussed the diagnosis and differential and I answered the patient's questions.     Assessment:  After physical and neurologic examination, review of laboratory studies,  Personal review of imaging studies, reports of other /same  Imaging studies ,  Results of polysomnography/ neurophysiology testing and pre-existing records as far as provided in visit., my assessment is   1) Mrs. Dixons balance problem is not evidient here today. We walked the hall and she turned just fine. No drift, negative romberg. Perhaps the  balance was worse while on Lasix, with elevated creatinine?  I see no nystagmus, no signs of vertigo.   2) She also is learned that high blood pressure has caused most of her headaches for which she suffered until about last here.  3) We found only mild OSA -treated with auto CPAP- she has become poorly compliant, I encourage her to use it regularly again. Marland Kitchen     4) stool incontinence ( IBS), but not bladder incontinence- surely also related to spina bifida.  Diarrhea, status post fundoplication,  Strong family history of IBS- diarrhea. Has strictures of the oesophagus. Return to Dr Henrene Pastor, GI.    5) bilateral foot drop, left worse over right,  is related to spina bifida   Plan:  Treatment plan and additional workup :   Stay on CPAP,  Supplement iron, elimination diet to identify triggers for diarrhea- gluten free diet for a trial period. Keep hydrating well, and keep walking with AFO and cane. Change  structures in your home to make these handicapped accessible, safer.  I do think that the sphincter tone is clearly related to her spina bifida and a neurologic concern, but this cannot explain the diarrhea.  Rv for CPAP and gait with NP or me in 5- 6 month.  I am forward to Dr.Coladonato's  assessment  Larey Seat MD  09/09/2017   CC: Prince Sol[address], [address] [place], [place] [address]

## 2017-09-29 ENCOUNTER — Other Ambulatory Visit: Payer: Self-pay | Admitting: Internal Medicine

## 2017-09-29 DIAGNOSIS — Z1231 Encounter for screening mammogram for malignant neoplasm of breast: Secondary | ICD-10-CM

## 2017-10-26 IMAGING — DX DG CHEST 2V
2 series · 2 of 2 positions shown · non-contrast
Comparison: PA and lateral chest x-ray September 03, 2016

CLINICAL DATA: Dyspnea, productive cough, wheezing for the past 2
days. History of bronchiectasis

EXAM:
CHEST  2 VIEW

[chest pa]
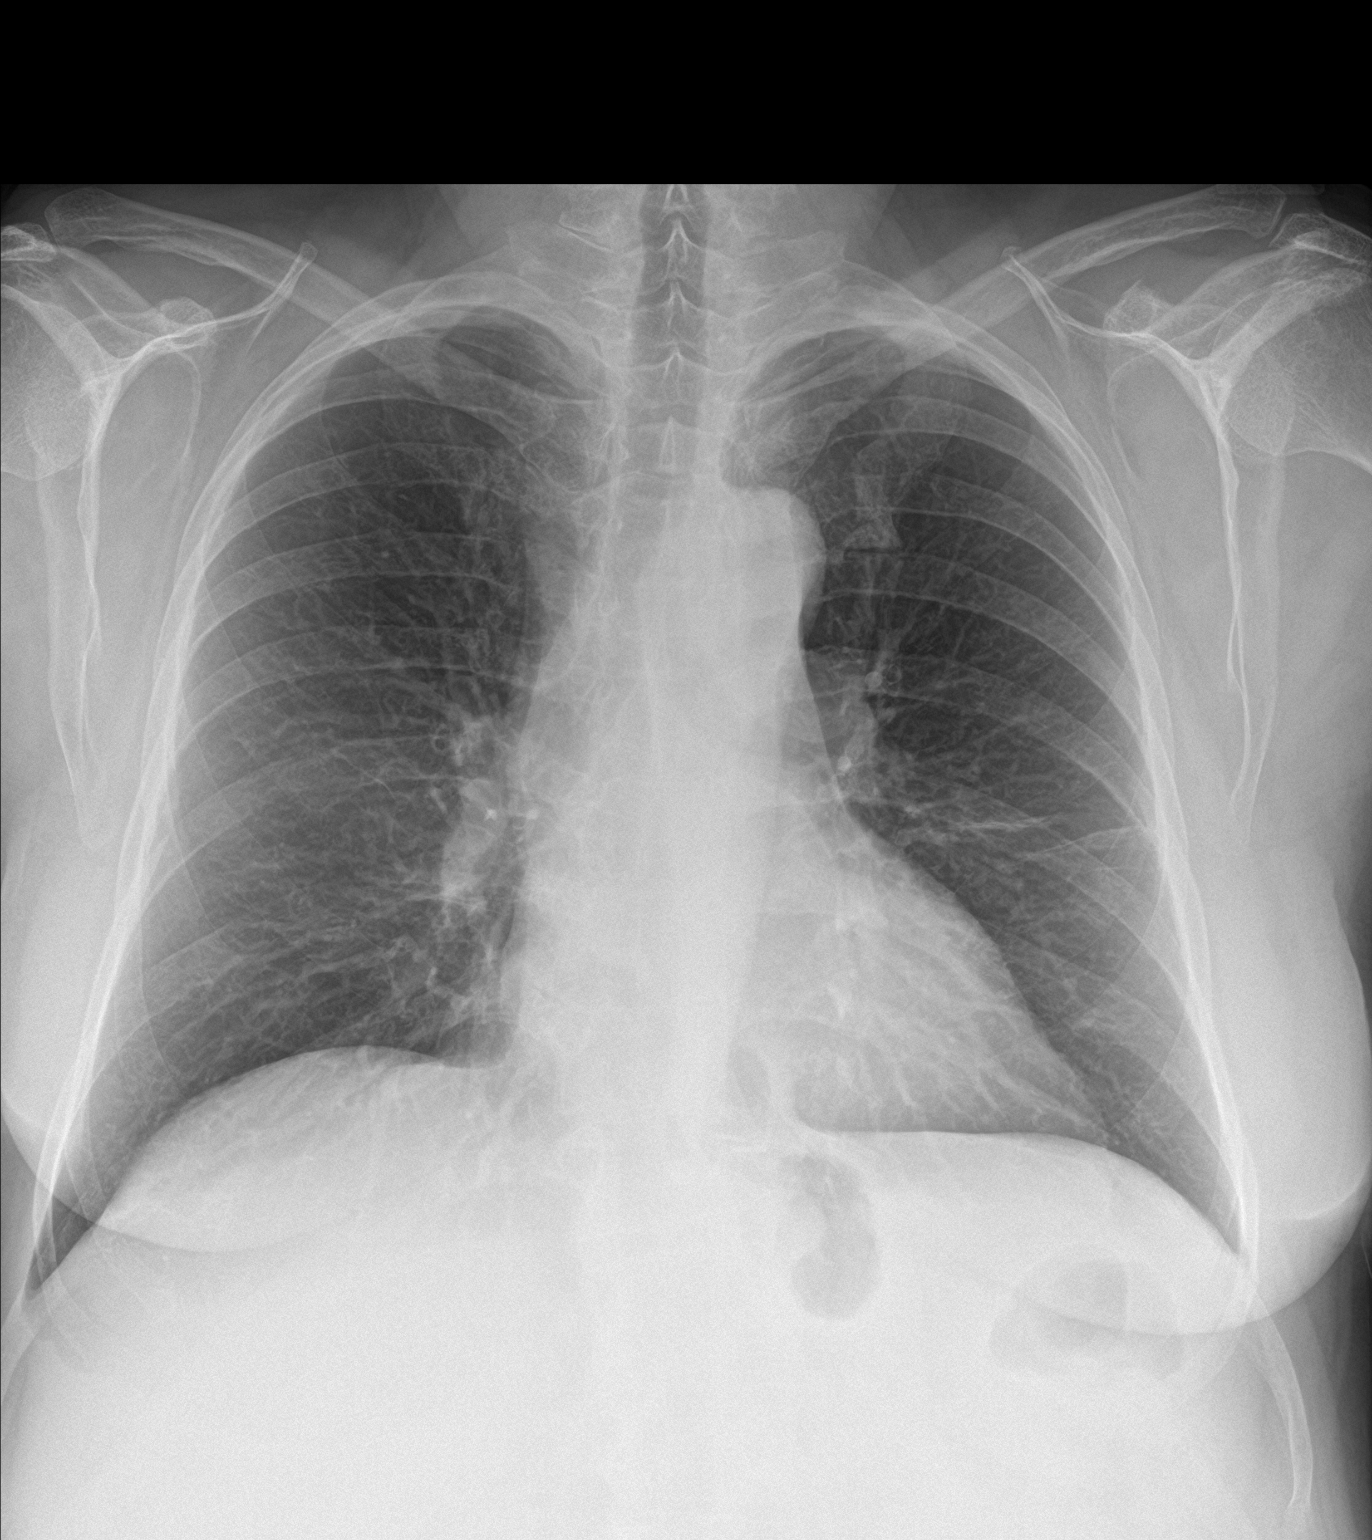

[chest lat]
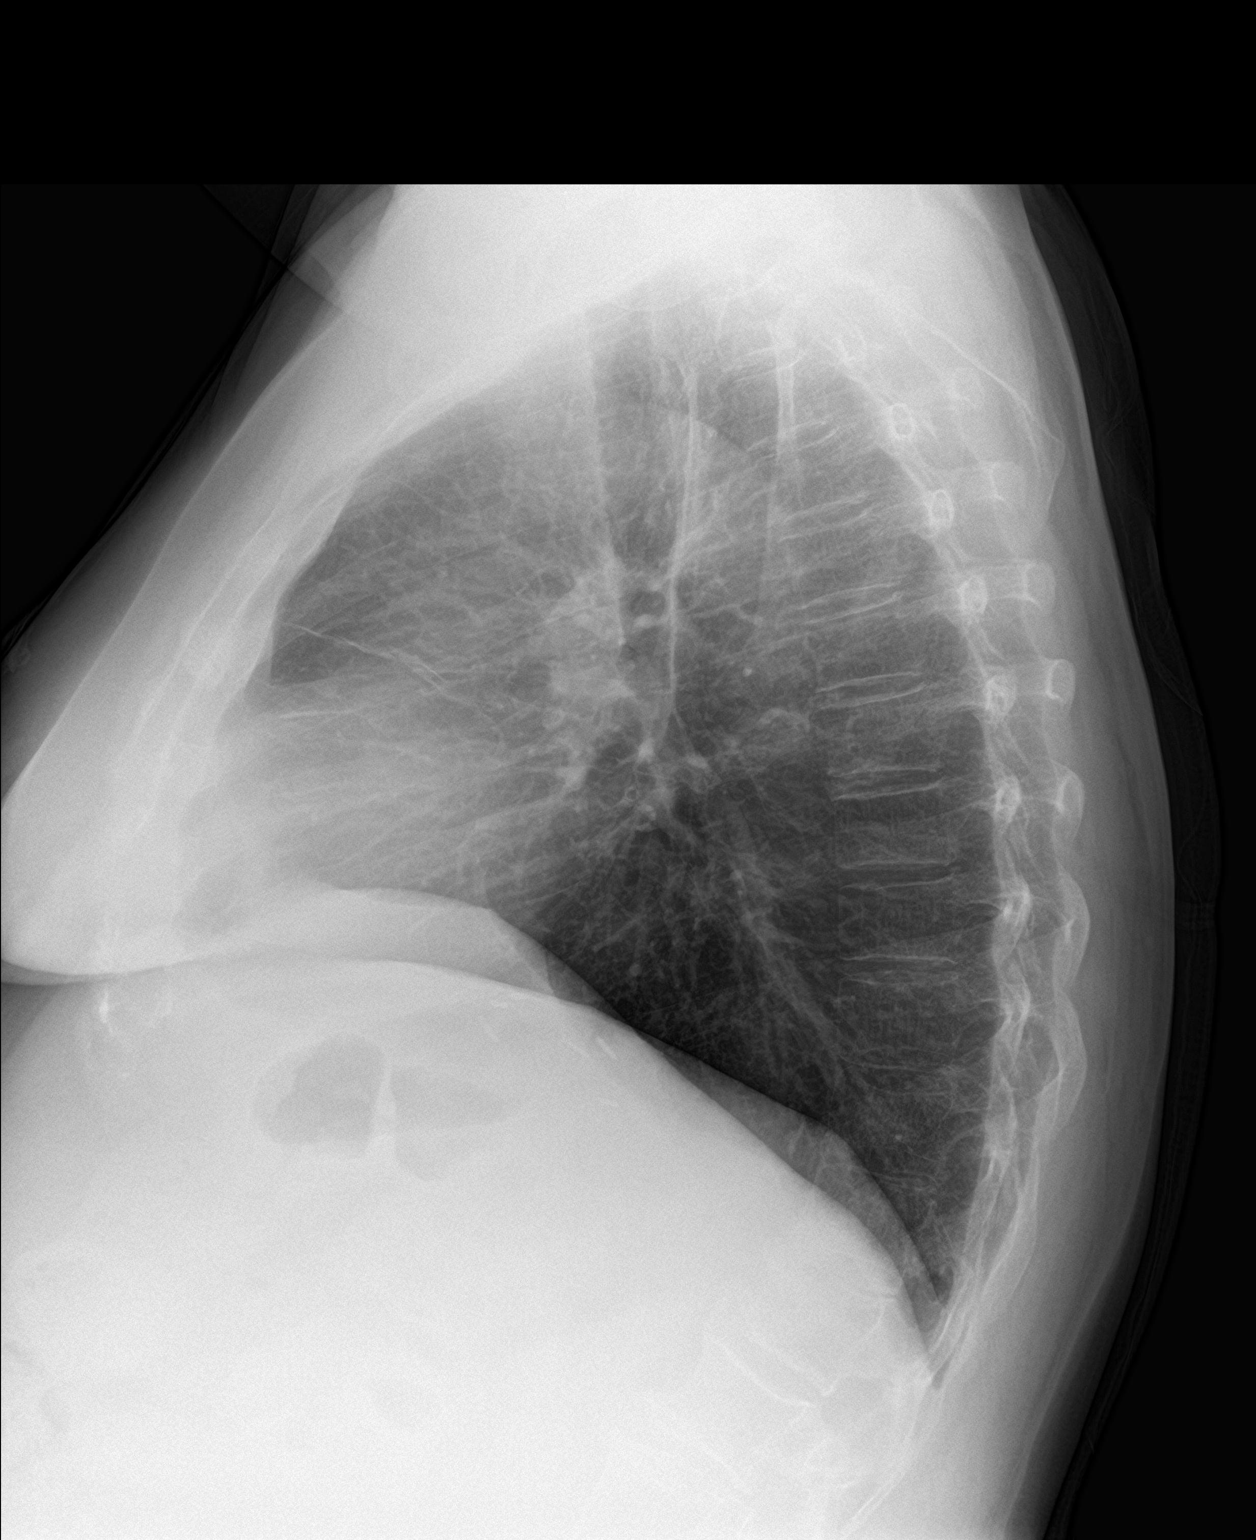

[2 of 2 positions shown; findings below may reference images not displayed]

FINDINGS: The lungs are well-expanded. There is stable scarring in the left
mid lung and stable prominent lung markings in the left lower lobe.
There is no alveolar infiltrate or pleural effusion. The heart and
pulmonary vascularity are normal. The mediastinum is normal in
width. The bony thorax exhibits no acute abnormality.
IMPRESSION: There is no acute cardiopulmonary abnormality. There is stable
scarring in the left mid lung. Mild stable lung markings in the left
lower lobe likely reflect known bronchiectasis.

## 2017-11-12 ENCOUNTER — Ambulatory Visit
Admission: RE | Admit: 2017-11-12 | Discharge: 2017-11-12 | Disposition: A | Discharge status: Home / Self Care | Payer: Medicare Other | Source: Ambulatory Visit | Attending: Internal Medicine | Admitting: Internal Medicine

## 2017-11-12 DIAGNOSIS — Z1231 Encounter for screening mammogram for malignant neoplasm of breast: Secondary | ICD-10-CM

## 2017-12-22 ENCOUNTER — Encounter: Payer: Self-pay | Admitting: Physician Assistant

## 2017-12-22 DIAGNOSIS — I2510 Atherosclerotic heart disease of native coronary artery without angina pectoris: Secondary | ICD-10-CM | POA: Insufficient documentation

## 2017-12-22 HISTORY — DX: Atherosclerotic heart disease of native coronary artery without angina pectoris: I25.10

## 2017-12-22 NOTE — Progress Notes (Signed)
Cardiology Office Note:    Date:  12/23/2017   ID:  STEPHEN TURNBAUGH, DOB [birth year], MRN [id number]  PCP:  Prince Solian, MD  Cardiologist:  Dorris Carnes, MD  Neurologist: Larey Seat, MD Pulmonologist: Kara Mead, MD Gastroenterologist: Scarlette Shorts, MD Nephrologist:  Katrine Coho, MD  Referring MD: Prince Solian, MD   Chief Complaint  Patient presents with  . Chest Pain    History of Present Illness:    Tiffany [name] is a 67 y.o. female with coronary calcification on prior CT, palpitations, diabetes, hypertension, hyperlipidemia, sleep apnea on CPAP bronchiectasis, GERD status post Nissen fundoplication.  She was last seen by Dr. Harrington Challenger in April 2017.  Event monitor in 2017 demonstrated normal rhythm.      Tiffany [name] presents for evaluation of chest pain.  She is here today with her husband.  She is a retired Therapist, sports.  Over the past 3 weeks, she has experienced substernal chest tightness with associated dyspnea, nausea, diaphoresis and radiation to her left neck and left arm.  This occurs with minimal activity and resolves with rest.  She feels best when she is lying down.  She notes significant fatigue over the past several weeks.  She sleeps on an incline chronically.  She denies PND or significant edema.  She denies syncope but has been lightheaded at times.  Of note, her metformin was recently discontinued by her primary care physician secondary to diarrhea.  She has a history of rectal bleeding on aspirin years ago.  She did not need a transfusion or other testing and has recently been placed back on aspirin without any apparent side effects.  Prior CV studies:   The following studies were reviewed today:  Event monitor 12/21/15 No arrhythmias noted Sinus rhythm  Nuclear stress test 04/05/15 EF 64.  Low risk stress nuclear study with normal perfusion and normal left ventricular regional and global systolic function.  Nuclear stress test 10/23/10 IMPRESSION: Normal  myocardial perfusion examination.  No evidence for ischemia or reversible defect.  Past Medical History:  Diagnosis Date  . Allergy   . Anemia    pernicious anemia- recieved iron infusions until 10-2015  . Anginal pain ([place])   . Anxiety   . Arthritis    foot drop  lt foot  / toes   wears brace     . Bronchiectasis ([place])   . Cancer ([place])    SKIN    . Cataract    REMOVED  . Chronic cough   . Colon polyps   . Coronary artery calcification seen on CT scan 12/22/2017   CT in 2016  . Depression   . Diabetes mellitus   . Dysphagia   . Dysrhythmia    RAPID HEART BEAT , CHEST TIGHTNESS  09/29/14     . Gastritis   . GERD (gastroesophageal reflux disease)   . Hiatal hernia   . Hyperlipidemia   . Hypertension   . IBS (irritable bowel syndrome)   . Insomnia   . Kidney stones    KIDNEY DISEASE   STAGE lll    . Kidney stones   . Macular disorder    MACULAR DISEASE  RT EYE   . Meningitis   . Osteoporosis   . Pneumonia    BRONCHIECTASIS  . Shortness of breath dyspnea    WITH EXERTION  . Sleep apnea    uses cpap  . Spina bifida occulta   . Ulcer    Surgical Hx: The patient  has  a past surgical history that includes Back surgery; Abdominal hysterectomy; Tonsillectomy; Polypectomy; Meniscus repair; fundoplication; Lithotripsy; Elbow surgery; Cataract extraction (03/2012); Colonoscopy; Knee arthroscopy; eardrum; and Total knee arthroplasty (Right, 10/11/2014).   Current Medications: Current Meds  Medication Sig  . acetaminophen (TYLENOL) 500 MG tablet Take 1,000 mg by mouth every 8 (eight) hours as needed for mild pain or moderate pain.  Marland Kitchen aspirin EC 81 MG tablet Take 81 mg by mouth daily.  Marland Kitchen atorvastatin (LIPITOR) 40 MG tablet Take 1 tablet (40 mg total) by mouth daily.  . Calcium Carbonate-Vit D-Min (CALCIUM 1200 PO) Take 1 tablet by mouth daily.  . Cholecalciferol (VITAMIN D) 2000 UNITS CAPS Take 2 capsules by mouth daily.  . diazepam (VALIUM) 10 MG tablet Take 10 mg by mouth at  bedtime.  Marland Kitchen EPIPEN 2-PAK 0.3 MG/0.3ML SOAJ injection as needed.  . furosemide (LASIX) 40 MG tablet Take 40 mg by mouth daily as needed for fluid or edema. Reported on 03/07/2016  . Loperamide HCl (IMODIUM PO) Take by mouth as needed.  . loratadine (CLARITIN) 10 MG tablet Take 10 mg by mouth daily as needed for allergies.   Marland Kitchen meclizine (ANTIVERT) 25 MG tablet Take 25 mg by mouth every 6 (six) hours as needed.  . metoprolol succinate (TOPROL-XL) 50 MG 24 hr tablet Take 100 mg by mouth daily.   Marland Kitchen NITROSTAT 0.4 MG SL tablet Take 1 tablet by mouth as needed.  . ONE TOUCH ULTRA TEST test strip as needed.  Tiffany [name] DELICA LANCETS 56E MISC as directed.  Tiffany [name] Glycol-Propyl Glycol (SYSTANE ULTRA) 0.4-0.3 % SOLN Apply 1-2 drops to eye daily.  . promethazine (PHENERGAN) 25 MG tablet Take 25 mg by mouth every 6 (six) hours as needed for nausea or vomiting.   Marland Kitchen Respiratory Therapy Supplies (FLUTTER) DEVI Use as directed  . VITAMIN B1-B12 IJ Inject as directed every 30 (thirty) days.  . [DISCONTINUED] amLODipine (NORVASC) 2.5 MG tablet Take 2.5 mg by mouth daily.   Current Facility-Administered Medications for the 12/23/17 encounter (Office Visit) with Richardson Dopp T, PA-C  Medication  . 0.9 %  sodium chloride infusion     Allergies:   Ambien [zolpidem tartrate]; Aspirin; Benicar [olmesartan medoxomil]; Codeine; Erythromycin; Ketorolac; Ketorolac tromethamine; Latex; Lisinopril; Olmesartan; Other; Reclast [zoledronic acid]; Toradol [ketorolac tromethamine]; Zolpidem; Fenofibrate; Glucosamine; Glucosamine forte [nutritional supplements]; Levaquin [levofloxacin in d5w]; Levofloxacin; Percocet [oxycodone-acetaminophen]; Tramadol; Lomotil [diphenoxylate-atropine]; and Tizanidine   Social History   Tobacco Use  . Smoking status: Never Smoker  . Smokeless tobacco: Never Used  Substance Use Topics  . Alcohol use: No    Comment: OCC.  . Drug use: No     Family Hx: The patient's family history  includes Alzheimer's disease in her father; Cancer in her mother; Colon cancer in her maternal grandmother and mother; Coronary artery disease in her unknown relative; Diabetes in her mother; Esophageal cancer in her cousin and cousin; Gout in her father; Heart attack (age of onset: 23) in her maternal grandmother; Heart attack (age of onset: 71) in her maternal grandfather; Hypertension in her mother; Irritable bowel syndrome in her father; Nephrolithiasis in her brother and mother; Ovarian cancer in her mother; Thyroid cancer in her sister; Ulcers in her father. There is no history of Colon polyps, Rectal cancer, or Stomach cancer.  ROS:   Please see the history of present illness.    ROS All other systems reviewed and are negative.   EKGs/Labs/Other Test Reviewed:    EKG:  EKG is  ordered today.  The ekg ordered today demonstrates normal sinus rhythm, heart rate 54, nonspecific ST-T wave changes, QTC 449 ms, no change from prior tracings  ECG from PCP dated 12/18/2017: Normal sinus rhythm, nonspecific ST-T wave changes, QTC 443  Recent Labs: 05/01/2017: BUN 18; Creatinine, Ser 1.24; Hemoglobin 11.9; Platelets 193.0; Potassium 4.2; Sodium 141   Labs from PCP 12/18/2017 Creatinine 1.2 Potassium 4.5 ALT 29 Hemoglobin 13.3  Recent Lipid Panel Lab Results  Component Value Date/Time   CHOL 165 02/29/2016 08:19 AM   TRIG 279 (H) 02/29/2016 08:19 AM   HDL 42 (L) 02/29/2016 08:19 AM   CHOLHDL 3.9 02/29/2016 08:19 AM   LDLCALC 67 02/29/2016 08:19 AM   LDLDIRECT 94.0 05/05/2015 10:38 AM    Physical Exam:    VS:  BP 128/68   Pulse (!) 59   Ht 5\' 3"  (1.6 m)   Wt 174 lb (78.9 kg)   SpO2 96%   BMI 30.82 kg/m     Wt Readings from Last 3 Encounters:  12/23/17 174 lb (78.9 kg)  09/09/17 180 lb 8 oz (81.9 kg)  05/01/17 176 lb 3.2 oz (79.9 kg)     Physical Exam  Constitutional: She is oriented to person, place, and time. She appears well-developed and well-nourished. No distress.  HENT:   Head: Normocephalic and atraumatic.  Neck: Neck supple. No JVD present. Carotid bruit is not present.  Cardiovascular: Normal rate, regular rhythm, S1 normal, S2 normal and normal heart sounds.  No murmur heard. Pulmonary/Chest: Effort normal and breath sounds normal. She has no rales.  Abdominal: Soft. There is no hepatomegaly.  Musculoskeletal: She exhibits no edema.  Neurological: She is alert and oriented to person, place, and time.  Skin: Skin is warm and dry.    ASSESSMENT & PLAN:    Angina pectoris ([place]) She presents with exertional chest discomfort and shortness of breath consistent with angina.  She has a history of coronary artery calcification on prior chest CT.  She also has coronary artery disease risk equivalent with diabetes.  Given the high pretest probability of obstructive coronary artery disease, I have recommended proceeding with cardiac catheterization.  I discussed this with Dr. Radford Pax (attending MD), who agreed.  Risks and benefits of cardiac catheterization have been discussed with the patient.  These include bleeding, infection, kidney damage, stroke, heart attack, death.  The patient understands these risks and is willing to proceed.  We discussed her prior history of rectal bleeding while on aspirin.  She does not appear to have a true allergy or have any contraindications to continuing aspirin.  If she does have obstructive coronary disease noted and needs to be on dual platelet therapy, it may be worthwhile placing her on famotidine for GI protection.  -Arrange cardiac catheterization this week  -Continue aspirin, amlodipine, beta-blocker, statin  -We discussed when to use as needed nitroglycerin  -She knows to go the emergency room if she develops rest pain or unstable symptoms  Coronary artery calcification seen on CT scan Continue aspirin, statin.  Proceed with cardiac catheterization as noted.  Essential hypertension The patient's blood pressure is  controlled on her current regimen.  Continue current therapy.   Type 2 diabetes mellitus without complication, without long-term current use of insulin (HCC) Recent A1c 6.3.  Metformin was recently stopped secondary to diarrhea.   Dispo:  Return in about 2 weeks (around 01/06/2018) for Post Procedure Follow Up, w/ Richardson Dopp, PA-C.   Medication Adjustments/Labs and Tests Ordered: Current medicines  are reviewed at length with the patient today.  Concerns regarding medicines are outlined above.  Tests Ordered: Orders Placed This Encounter  Procedures  . EKG 12-Lead   Medication Changes: No orders of the defined types were placed in this encounter.   Signed, Richardson Dopp, PA-C  12/23/2017 9:34 AM    [facility] [place], [place], [place]  [address] Phone: [phone]; Fax: [phone]

## 2017-12-22 NOTE — H&P (View-Only) (Signed)
Cardiology Office Note:    Date:  12/23/2017   ID:  Tiffany [name], DOB 14-Apr-1951, MRN [id number]  PCP:  Tiffany Solian, MD  Cardiologist:  Tiffany Carnes, MD  Neurologist: Tiffany Seat, MD Pulmonologist: Tiffany Mead, MD Gastroenterologist: Tiffany Shorts, MD Nephrologist:  Tiffany Coho, MD  Referring MD: Tiffany Solian, MD   Chief Complaint  Patient presents with  . Chest Pain    History of Present Illness:    Tiffany [name] is a 67 y.o. female with coronary calcification on prior CT, palpitations, diabetes, hypertension, hyperlipidemia, sleep apnea on CPAP bronchiectasis, GERD status post Nissen fundoplication.  She was last seen by Dr. Harrington [name] in April 2017.  Event monitor in 2017 demonstrated normal rhythm.      Ms. Tiffany [name] presents for evaluation of chest pain.  She is here today with her husband.  She is a retired Therapist, sports.  Over the past 3 weeks, she has experienced substernal chest tightness with associated dyspnea, nausea, diaphoresis and radiation to her left neck and left arm.  This occurs with minimal activity and resolves with rest.  She feels best when she is lying down.  She notes significant fatigue over the past several weeks.  She sleeps on an incline chronically.  She denies PND or significant edema.  She denies syncope but has been lightheaded at times.  Of note, her metformin was recently discontinued by her primary care physician secondary to diarrhea.  She has a history of rectal bleeding on aspirin years ago.  She did not need a transfusion or other testing and has recently been placed back on aspirin without any apparent side effects.  Prior CV studies:   The following studies were reviewed today:  Event monitor 12/21/15 No arrhythmias noted Sinus rhythm  Nuclear stress test 04/05/15 EF 64.  Low risk stress nuclear study with normal perfusion and normal left ventricular regional and global systolic function.  Nuclear stress test 10/23/10 IMPRESSION: Normal  myocardial perfusion examination.  No evidence for ischemia or reversible defect.  Past Medical History:  Diagnosis Date  . Allergy   . Anemia    pernicious anemia- recieved iron infusions until 10-2015  . Anginal pain ([place])   . Anxiety   . Arthritis    foot drop  lt foot  / toes   wears brace     . Bronchiectasis ([place])   . Cancer ([place])    SKIN    . Cataract    REMOVED  . Chronic cough   . Colon polyps   . Coronary artery calcification seen on CT scan 12/22/2017   CT in 2016  . Depression   . Diabetes mellitus   . Dysphagia   . Dysrhythmia    RAPID HEART BEAT , CHEST TIGHTNESS  09/29/14     . Gastritis   . GERD (gastroesophageal reflux disease)   . Hiatal hernia   . Hyperlipidemia   . Hypertension   . IBS (irritable bowel syndrome)   . Insomnia   . Kidney stones    KIDNEY DISEASE   STAGE lll    . Kidney stones   . Macular disorder    MACULAR DISEASE  RT EYE   . Meningitis   . Osteoporosis   . Pneumonia    BRONCHIECTASIS  . Shortness of breath dyspnea    WITH EXERTION  . Sleep apnea    uses cpap  . Spina bifida occulta   . Ulcer    Surgical Hx: The patient  has  a past surgical history that includes Back surgery; Abdominal hysterectomy; Tonsillectomy; Polypectomy; Meniscus repair; fundoplication; Lithotripsy; Elbow surgery; Cataract extraction (03/2012); Colonoscopy; Knee arthroscopy; eardrum; and Total knee arthroplasty (Right, 10/11/2014).   Current Medications: Current Meds  Medication Sig  . acetaminophen (TYLENOL) 500 MG tablet Take 1,000 mg by mouth every 8 (eight) hours as needed for mild pain or moderate pain.  Marland Kitchen aspirin EC 81 MG tablet Take 81 mg by mouth daily.  Marland Kitchen atorvastatin (LIPITOR) 40 MG tablet Take 1 tablet (40 mg total) by mouth daily.  . Calcium Carbonate-Vit D-Min (CALCIUM 1200 PO) Take 1 tablet by mouth daily.  . Cholecalciferol (VITAMIN D) 2000 UNITS CAPS Take 2 capsules by mouth daily.  . diazepam (VALIUM) 10 MG tablet Take 10 mg by mouth at  bedtime.  Marland Kitchen EPIPEN 2-PAK 0.3 MG/0.3ML SOAJ injection as needed.  . furosemide (LASIX) 40 MG tablet Take 40 mg by mouth daily as needed for fluid or edema. Reported on 03/07/2016  . Loperamide HCl (IMODIUM PO) Take by mouth as needed.  . loratadine (CLARITIN) 10 MG tablet Take 10 mg by mouth daily as needed for allergies.   Marland Kitchen meclizine (ANTIVERT) 25 MG tablet Take 25 mg by mouth every 6 (six) hours as needed.  . metoprolol succinate (TOPROL-XL) 50 MG 24 hr tablet Take 100 mg by mouth daily.   Marland Kitchen NITROSTAT 0.4 MG SL tablet Take 1 tablet by mouth as needed.  . ONE TOUCH ULTRA TEST test strip as needed.  Glory Rosebush DELICA LANCETS 16X MISC as directed.  Vladimir Faster Glycol-Propyl Glycol (SYSTANE ULTRA) 0.4-0.3 % SOLN Apply 1-2 drops to eye daily.  . promethazine (PHENERGAN) 25 MG tablet Take 25 mg by mouth every 6 (six) hours as needed for nausea or vomiting.   Marland Kitchen Respiratory Therapy Supplies (FLUTTER) DEVI Use as directed  . VITAMIN B1-B12 IJ Inject as directed every 30 (thirty) days.  . [DISCONTINUED] amLODipine (NORVASC) 2.5 MG tablet Take 2.5 mg by mouth daily.   Current Facility-Administered Medications for the 12/23/17 encounter (Office Visit) with Richardson Dopp T, PA-C  Medication  . 0.9 %  sodium chloride infusion     Allergies:   Ambien [zolpidem tartrate]; Aspirin; Benicar [olmesartan medoxomil]; Codeine; Erythromycin; Ketorolac; Ketorolac tromethamine; Latex; Lisinopril; Olmesartan; Other; Reclast [zoledronic acid]; Toradol [ketorolac tromethamine]; Zolpidem; Fenofibrate; Glucosamine; Glucosamine forte [nutritional supplements]; Levaquin [levofloxacin in d5w]; Levofloxacin; Percocet [oxycodone-acetaminophen]; Tramadol; Lomotil [diphenoxylate-atropine]; and Tizanidine   Social History   Tobacco Use  . Smoking status: Never Smoker  . Smokeless tobacco: Never Used  Substance Use Topics  . Alcohol use: No    Comment: OCC.  . Drug use: No     Family Hx: The patient's family history  includes Alzheimer's disease in her father; Cancer in her mother; Colon cancer in her maternal grandmother and mother; Coronary artery disease in her unknown relative; Diabetes in her mother; Esophageal cancer in her cousin and cousin; Gout in her father; Heart attack (age of onset: 42) in her maternal grandmother; Heart attack (age of onset: 58) in her maternal grandfather; Hypertension in her mother; Irritable bowel syndrome in her father; Nephrolithiasis in her brother and mother; Ovarian cancer in her mother; Thyroid cancer in her sister; Ulcers in her father. There is no history of Colon polyps, Rectal cancer, or Stomach cancer.  ROS:   Please see the history of present illness.    ROS All other systems reviewed and are negative.   EKGs/Labs/Other Test Reviewed:    EKG:  EKG is  ordered today.  The ekg ordered today demonstrates normal sinus rhythm, heart rate 54, nonspecific ST-T wave changes, QTC 449 ms, no change from prior tracings  ECG from PCP dated 12/18/2017: Normal sinus rhythm, nonspecific ST-T wave changes, QTC 443  Recent Labs: 05/01/2017: BUN 18; Creatinine, Ser 1.24; Hemoglobin 11.9; Platelets 193.0; Potassium 4.2; Sodium 141   Labs from PCP 12/18/2017 Creatinine 1.2 Potassium 4.5 ALT 29 Hemoglobin 13.3  Recent Lipid Panel Lab Results  Component Value Date/Time   CHOL 165 02/29/2016 08:19 AM   TRIG 279 (H) 02/29/2016 08:19 AM   HDL 42 (L) 02/29/2016 08:19 AM   CHOLHDL 3.9 02/29/2016 08:19 AM   LDLCALC 67 02/29/2016 08:19 AM   LDLDIRECT 94.0 05/05/2015 10:38 AM    Physical Exam:    VS:  BP 128/68   Pulse (!) 59   Ht 5\' 3"  (1.6 m)   Wt 174 lb (78.9 kg)   SpO2 96%   BMI 30.82 kg/m     Wt Readings from Last 3 Encounters:  12/23/17 174 lb (78.9 kg)  09/09/17 180 lb 8 oz (81.9 kg)  05/01/17 176 lb 3.2 oz (79.9 kg)     Physical Exam  Constitutional: She is oriented to person, place, and time. She appears well-developed and well-nourished. No distress.  HENT:   Head: Normocephalic and atraumatic.  Neck: Neck supple. No JVD present. Carotid bruit is not present.  Cardiovascular: Normal rate, regular rhythm, S1 normal, S2 normal and normal heart sounds.  No murmur heard. Pulmonary/Chest: Effort normal and breath sounds normal. She has no rales.  Abdominal: Soft. There is no hepatomegaly.  Musculoskeletal: She exhibits no edema.  Neurological: She is alert and oriented to person, place, and time.  Skin: Skin is warm and dry.    ASSESSMENT & PLAN:    Angina pectoris ([place]) She presents with exertional chest discomfort and shortness of breath consistent with angina.  She has a history of coronary artery calcification on prior chest CT.  She also has coronary artery disease risk equivalent with diabetes.  Given the high pretest probability of obstructive coronary artery disease, I have recommended proceeding with cardiac catheterization.  I discussed this with Dr. Radford Pax (attending MD), who agreed.  Risks and benefits of cardiac catheterization have been discussed with the patient.  These include bleeding, infection, kidney damage, stroke, heart attack, death.  The patient understands these risks and is willing to proceed.  We discussed her prior history of rectal bleeding while on aspirin.  She does not appear to have a true allergy or have any contraindications to continuing aspirin.  If she does have obstructive coronary disease noted and needs to be on dual platelet therapy, it may be worthwhile placing her on famotidine for GI protection.  -Arrange cardiac catheterization this week  -Continue aspirin, amlodipine, beta-blocker, statin  -We discussed when to use as needed nitroglycerin  -She knows to go the emergency room if she develops rest pain or unstable symptoms  Coronary artery calcification seen on CT scan Continue aspirin, statin.  Proceed with cardiac catheterization as noted.  Essential hypertension The patient's blood pressure is  controlled on her current regimen.  Continue current therapy.   Type 2 diabetes mellitus without complication, without long-term current use of insulin (HCC) Recent A1c 6.3.  Metformin was recently stopped secondary to diarrhea.   Dispo:  Return in about 2 weeks (around 01/06/2018) for Post Procedure Follow Up, w/ Richardson Dopp, PA-C.   Medication Adjustments/Labs and Tests Ordered: Current medicines  are reviewed at length with the patient today.  Concerns regarding medicines are outlined above.  Tests Ordered: Orders Placed This Encounter  Procedures  . EKG 12-Lead   Medication Changes: No orders of the defined types were placed in this encounter.   Signed, Richardson Dopp, PA-C  12/23/2017 9:34 AM    [facility] [place], [place], [place]  [address] Phone: [phone]; Fax: [phone]

## 2017-12-23 ENCOUNTER — Encounter: Payer: Self-pay | Admitting: Physician Assistant

## 2017-12-23 ENCOUNTER — Encounter: Payer: Self-pay | Admitting: *Deleted

## 2017-12-23 ENCOUNTER — Telehealth: Payer: Self-pay | Admitting: *Deleted

## 2017-12-23 ENCOUNTER — Ambulatory Visit: Payer: Medicare Other | Admitting: Physician Assistant

## 2017-12-23 VITALS — BP 128/68 | HR 59 | Ht 63.0 in | Wt 174.0 lb

## 2017-12-23 DIAGNOSIS — I2510 Atherosclerotic heart disease of native coronary artery without angina pectoris: Secondary | ICD-10-CM

## 2017-12-23 DIAGNOSIS — E119 Type 2 diabetes mellitus without complications: Secondary | ICD-10-CM

## 2017-12-23 DIAGNOSIS — I10 Essential (primary) hypertension: Secondary | ICD-10-CM

## 2017-12-23 DIAGNOSIS — I209 Angina pectoris, unspecified: Secondary | ICD-10-CM

## 2017-12-23 NOTE — Patient Instructions (Addendum)
Medication Instructions:  Your physician recommends that you continue on your current medications as directed. Please refer to the Current Medication list given to you today.   Labwork: NONE ORDERED TODAY  Testing/Procedures: Your physician has requested that you have a cardiac catheterization. Cardiac catheterization is used to diagnose and/or treat various heart conditions. Doctors may recommend this procedure for a number of different reasons. The most common reason is to evaluate chest pain. Chest pain can be a symptom of coronary artery disease (CAD), and cardiac catheterization can show whether plaque is narrowing or blocking your heart's arteries. This procedure is also used to evaluate the valves, as well as measure the blood flow and oxygen levels in different parts of your heart. For further information please visit [email or web address]. Please follow instruction sheet, as given.    Follow-Up: [name], [place] 14, 2019 @ 8:45 AM , THIS IS YOUR POST PROCEDURE FOLLOW UP APPT  Any Other Special Instructions Will Be Listed Below (If Applicable).  If you need a refill on your cardiac medications before your next appointment, please call your pharmacy.    [facility] OFF[facility] [address] [place] [address] Dept: [phone] Loc: [phone]  Tiffany [name]  12/23/2017  You are scheduled for a Cardiac Catheterization on Thursday, May 2 with Dr. Harrell Gave End @ 2:30 PM  1. Please arrive at the [facility] (Main Entrance A) at [facility]: [address] [place], [place] [address] at 12:30 PM (two hours before your procedure to ensure your preparation). Free valet parking service is available.   Special note: Every effort is made to have your procedure done on time. Please understand that emergencies sometimes delay scheduled procedures.  2. Diet: You may have a clear  liquid breakfast, but nothing after 8 AM on your procedure day.  3. Labs: LABS DONE RECENTLY WITH PCP  4. Medication instructions in preparation for your procedure: Stop taking, HOLD LASIX THE MORNING OF CATH on Thursday, May 2.    On the morning of your procedure, take your Aspirin 81 MG THE MORNING OF CATH and any morning medicines. You may use sips of water.  5. Plan for one night stay--bring personal belongings. 6. Bring a current list of your medications and current insurance cards. 7. You MUST have a responsible person to drive you home. 8. Someone MUST be with you the first 24 hours after you arrive home or your discharge will be delayed. 9. Please wear clothes that are easy to get on and off and wear slip-on shoes.  Thank you for allowing [place] to care for you!   -- [facility]

## 2017-12-23 NOTE — Telephone Encounter (Signed)
Pt contacted pre-catheterization scheduled at [facility] for: Thursday Dec 25, 2017 2:30 PM Verified arrival time and place: [place] Entrance A at: 9:30 AM pre-cath IV hydration  No solid food after midnight prior to cath, clear liquids until 5 AM Verified allergies in Epic Verified no diabetes medications-metformin was stopped last week by PCP  Hold: Furosemide 12/24/17,12/25/17   AM meds can be  taken pre-cath with sip of water including: ASA 81 mg  Confirmed patient has responsible person to drive home post procedure and observe patient for 24 hours: yes

## 2017-12-25 ENCOUNTER — Ambulatory Visit (HOSPITAL_COMMUNITY)
Admission: RE | Admit: 2017-12-25 | Discharge: 2017-12-25 | Disposition: A | Discharge status: Home / Self Care | Payer: Medicare Other | Source: Ambulatory Visit | Attending: Internal Medicine | Admitting: Internal Medicine

## 2017-12-25 ENCOUNTER — Telehealth: Payer: Self-pay | Admitting: Physician Assistant

## 2017-12-25 ENCOUNTER — Encounter (HOSPITAL_COMMUNITY)
Admission: RE | Disposition: A | Discharge status: Home / Self Care | Payer: Self-pay | Source: Ambulatory Visit | Attending: Internal Medicine

## 2017-12-25 DIAGNOSIS — Z833 Family history of diabetes mellitus: Secondary | ICD-10-CM | POA: Diagnosis not present

## 2017-12-25 DIAGNOSIS — N183 Chronic kidney disease, stage 3 (moderate): Secondary | ICD-10-CM | POA: Insufficient documentation

## 2017-12-25 DIAGNOSIS — Z96651 Presence of right artificial knee joint: Secondary | ICD-10-CM | POA: Insufficient documentation

## 2017-12-25 DIAGNOSIS — E785 Hyperlipidemia, unspecified: Secondary | ICD-10-CM | POA: Diagnosis not present

## 2017-12-25 DIAGNOSIS — E1122 Type 2 diabetes mellitus with diabetic chronic kidney disease: Secondary | ICD-10-CM | POA: Insufficient documentation

## 2017-12-25 DIAGNOSIS — Z7982 Long term (current) use of aspirin: Secondary | ICD-10-CM | POA: Diagnosis not present

## 2017-12-25 DIAGNOSIS — Z85828 Personal history of other malignant neoplasm of skin: Secondary | ICD-10-CM | POA: Insufficient documentation

## 2017-12-25 DIAGNOSIS — G4700 Insomnia, unspecified: Secondary | ICD-10-CM | POA: Insufficient documentation

## 2017-12-25 DIAGNOSIS — I209 Angina pectoris, unspecified: Secondary | ICD-10-CM

## 2017-12-25 DIAGNOSIS — Z90710 Acquired absence of both cervix and uterus: Secondary | ICD-10-CM | POA: Diagnosis not present

## 2017-12-25 DIAGNOSIS — I129 Hypertensive chronic kidney disease with stage 1 through stage 4 chronic kidney disease, or unspecified chronic kidney disease: Secondary | ICD-10-CM | POA: Diagnosis not present

## 2017-12-25 DIAGNOSIS — M21372 Foot drop, left foot: Secondary | ICD-10-CM | POA: Insufficient documentation

## 2017-12-25 DIAGNOSIS — Z87442 Personal history of urinary calculi: Secondary | ICD-10-CM | POA: Insufficient documentation

## 2017-12-25 DIAGNOSIS — Z881 Allergy status to other antibiotic agents status: Secondary | ICD-10-CM | POA: Insufficient documentation

## 2017-12-25 DIAGNOSIS — M810 Age-related osteoporosis without current pathological fracture: Secondary | ICD-10-CM | POA: Diagnosis not present

## 2017-12-25 DIAGNOSIS — R1084 Generalized abdominal pain: Secondary | ICD-10-CM

## 2017-12-25 DIAGNOSIS — R0789 Other chest pain: Secondary | ICD-10-CM | POA: Insufficient documentation

## 2017-12-25 DIAGNOSIS — I219 Acute myocardial infarction, unspecified: Secondary | ICD-10-CM | POA: Insufficient documentation

## 2017-12-25 DIAGNOSIS — Z86010 Personal history of colonic polyps: Secondary | ICD-10-CM | POA: Diagnosis not present

## 2017-12-25 DIAGNOSIS — K219 Gastro-esophageal reflux disease without esophagitis: Secondary | ICD-10-CM | POA: Insufficient documentation

## 2017-12-25 DIAGNOSIS — Z79899 Other long term (current) drug therapy: Secondary | ICD-10-CM | POA: Insufficient documentation

## 2017-12-25 DIAGNOSIS — I200 Unstable angina: Secondary | ICD-10-CM | POA: Diagnosis not present

## 2017-12-25 DIAGNOSIS — I2510 Atherosclerotic heart disease of native coronary artery without angina pectoris: Secondary | ICD-10-CM

## 2017-12-25 DIAGNOSIS — Z886 Allergy status to analgesic agent status: Secondary | ICD-10-CM | POA: Insufficient documentation

## 2017-12-25 DIAGNOSIS — Z888 Allergy status to other drugs, medicaments and biological substances status: Secondary | ICD-10-CM | POA: Insufficient documentation

## 2017-12-25 DIAGNOSIS — I25110 Atherosclerotic heart disease of native coronary artery with unstable angina pectoris: Secondary | ICD-10-CM | POA: Insufficient documentation

## 2017-12-25 DIAGNOSIS — Z8249 Family history of ischemic heart disease and other diseases of the circulatory system: Secondary | ICD-10-CM | POA: Insufficient documentation

## 2017-12-25 DIAGNOSIS — R0602 Shortness of breath: Secondary | ICD-10-CM

## 2017-12-25 DIAGNOSIS — K589 Irritable bowel syndrome without diarrhea: Secondary | ICD-10-CM | POA: Insufficient documentation

## 2017-12-25 DIAGNOSIS — Z841 Family history of disorders of kidney and ureter: Secondary | ICD-10-CM | POA: Diagnosis not present

## 2017-12-25 DIAGNOSIS — G4730 Sleep apnea, unspecified: Secondary | ICD-10-CM | POA: Diagnosis not present

## 2017-12-25 DIAGNOSIS — Z885 Allergy status to narcotic agent status: Secondary | ICD-10-CM | POA: Insufficient documentation

## 2017-12-25 DIAGNOSIS — Z91040 Latex allergy status: Secondary | ICD-10-CM | POA: Insufficient documentation

## 2017-12-25 HISTORY — PX: LEFT HEART CATH AND CORONARY ANGIOGRAPHY: CATH118249

## 2017-12-25 LAB — GLUCOSE, CAPILLARY
GLUCOSE-CAPILLARY: 101 mg/dL — AB (ref 65–99)
Glucose-Capillary: 136 mg/dL — ABNORMAL HIGH (ref 65–99)

## 2017-12-25 SURGERY — LEFT HEART CATH AND CORONARY ANGIOGRAPHY
Anesthesia: LOCAL

## 2017-12-25 MED ORDER — HEPARIN SODIUM (PORCINE) 1000 UNIT/ML IJ SOLN
INTRAMUSCULAR | Status: DC | PRN
  Administered 2017-12-25: 3500 [IU] via INTRAVENOUS

## 2017-12-25 MED ORDER — SODIUM CHLORIDE 0.9% FLUSH: 3.0000 mL | Freq: Two times a day (BID) | INTRAVENOUS | Status: DC

## 2017-12-25 MED ORDER — MIDAZOLAM HCL 2 MG/2ML IJ SOLN
INTRAMUSCULAR | Status: AC
  Filled 2017-12-25: qty 2

## 2017-12-25 MED ORDER — ACETAMINOPHEN 325 MG PO TABS: 650.0000 mg | ORAL_TABLET | ORAL | Status: DC | PRN

## 2017-12-25 MED ORDER — SODIUM CHLORIDE 0.9 % IV SOLN: INTRAVENOUS | Status: DC

## 2017-12-25 MED ORDER — SODIUM CHLORIDE 0.9 % WEIGHT BASED INFUSION: 1.0000 mL/kg/h | INTRAVENOUS | Status: DC

## 2017-12-25 MED ORDER — ASPIRIN 81 MG PO CHEW: 81.0000 mg | CHEWABLE_TABLET | ORAL | Status: DC

## 2017-12-25 MED ORDER — ONDANSETRON HCL 4 MG/2ML IJ SOLN: 4.0000 mg | Freq: Four times a day (QID) | INTRAMUSCULAR | Status: DC | PRN

## 2017-12-25 MED ORDER — FENTANYL CITRATE (PF) 100 MCG/2ML IJ SOLN
INTRAMUSCULAR | Status: AC
  Filled 2017-12-25: qty 2

## 2017-12-25 MED ORDER — IOHEXOL 350 MG/ML SOLN
INTRAVENOUS | Status: DC | PRN
  Administered 2017-12-25: 35 mL via INTRAVENOUS

## 2017-12-25 MED ORDER — HEPARIN SODIUM (PORCINE) 1000 UNIT/ML IJ SOLN
INTRAMUSCULAR | Status: AC
  Filled 2017-12-25: qty 1

## 2017-12-25 MED ORDER — MIDAZOLAM HCL 2 MG/2ML IJ SOLN
INTRAMUSCULAR | Status: DC | PRN
  Administered 2017-12-25: 1 mg via INTRAVENOUS

## 2017-12-25 MED ORDER — LIDOCAINE HCL (PF) 1 % IJ SOLN
INTRAMUSCULAR | Status: AC
  Filled 2017-12-25: qty 30

## 2017-12-25 MED ORDER — SODIUM CHLORIDE 0.9 % IV SOLN: 250.0000 mL | INTRAVENOUS | Status: DC | PRN

## 2017-12-25 MED ORDER — HEPARIN (PORCINE) IN NACL 1000-0.9 UT/500ML-% IV SOLN
INTRAVENOUS | Status: AC
  Filled 2017-12-25: qty 1000

## 2017-12-25 MED ORDER — SODIUM CHLORIDE 0.9% FLUSH: 3.0000 mL | INTRAVENOUS | Status: DC | PRN

## 2017-12-25 MED ORDER — LIDOCAINE HCL (PF) 1 % IJ SOLN
INTRAMUSCULAR | Status: DC | PRN
  Administered 2017-12-25: 2 mL

## 2017-12-25 MED ORDER — HEPARIN (PORCINE) IN NACL 2-0.9 UNITS/ML
INTRAMUSCULAR | Status: AC | PRN
  Administered 2017-12-25 (×2): 500 mL

## 2017-12-25 MED ORDER — SODIUM CHLORIDE 0.9 % WEIGHT BASED INFUSION
3.0000 mL/kg/h | INTRAVENOUS | Status: DC
  Administered 2017-12-25: 3 mL/kg/h via INTRAVENOUS

## 2017-12-25 MED ORDER — VERAPAMIL HCL 2.5 MG/ML IV SOLN
INTRAVENOUS | Status: AC
  Filled 2017-12-25: qty 2

## 2017-12-25 MED ORDER — FENTANYL CITRATE (PF) 100 MCG/2ML IJ SOLN
INTRAMUSCULAR | Status: DC | PRN
  Administered 2017-12-25: 25 ug via INTRAVENOUS

## 2017-12-25 MED ORDER — HEPARIN (PORCINE) IN NACL 2-0.9 UNIT/ML-% IJ SOLN
INTRAMUSCULAR | Status: DC | PRN
  Administered 2017-12-25: 10 mL via INTRA_ARTERIAL

## 2017-12-25 SURGICAL SUPPLY — 11 items
CATH INFINITI 5 FR JL3.5 (CATHETERS) ×2 IMPLANT
CATH INFINITI JR4 5F (CATHETERS) ×2 IMPLANT
DEVICE RAD COMP TR BAND LRG (VASCULAR PRODUCTS) ×2 IMPLANT
GLIDESHEATH SLEND SS 6F .021 (SHEATH) ×2 IMPLANT
GUIDEWIRE INQWIRE 1.5J.035X260 (WIRE) ×1 IMPLANT
INQWIRE 1.5J .035X260CM (WIRE) ×2
KIT HEART LEFT (KITS) ×2 IMPLANT
PACK CARDIAC CATHETERIZATION (CUSTOM PROCEDURE TRAY) ×2 IMPLANT
TRANSDUCER W/STOPCOCK (MISCELLANEOUS) ×2 IMPLANT
TUBING CIL FLEX 10 FLL-RA (TUBING) ×2 IMPLANT
WIRE HI TORQ VERSACORE-J 145CM (WIRE) ×2 IMPLANT

## 2017-12-25 NOTE — Research (Signed)
CADFEM Informed Consent   Subject Name: Tiffany [name]  Subject met inclusion and exclusion criteria.  The informed consent form, study requirements and expectations were reviewed with the subject and questions and concerns were addressed prior to the signing of the consent form.  The subject verbalized understanding of the trail requirements.  The subject agreed to participate in the CADFEM trial and signed the informed consent.  The informed consent was obtained prior to performance of any protocol-specific procedures for the subject.  A copy of the signed informed consent was given to the subject and a copy was placed in the subject's medical record.  [name] 12/25/2017, 12:15 PM

## 2017-12-25 NOTE — Discharge Instructions (Signed)

## 2017-12-25 NOTE — Telephone Encounter (Signed)
Great news that Tiffany [name] did not have any CAD on cardiac cath. Filling pressures were a little high and her LV was not injected (no ejection fraction was obtained).   Therefore, please schedule an echocardiogram (before follow up appointment). I have placed an order. Richardson Dopp, PA-C    12/25/2017 8:05 PM

## 2017-12-25 NOTE — Interval H&P Note (Signed)
History and Physical Interval Note:  12/25/2017 3:21 PM  Tiffany [name]  has presented today for cardiac catheterization, with the diagnosis of accelerating angina. The various methods of treatment have been discussed with the patient and family. After consideration of risks, benefits and other options for treatment, the patient has consented to  Procedure(s): LEFT HEART CATH AND CORONARY ANGIOGRAPHY (N/A) as a surgical intervention .  The patient's history has been reviewed, patient examined, no change in status, stable for surgery.  I have reviewed the patient's chart and labs.  Questions were answered to the patient's satisfaction.    Cath Lab Visit (complete for each Cath Lab visit)  Clinical Evaluation Leading to the Procedure:   ACS: No.  Non-ACS:    Anginal Classification: CCS III  Anti-ischemic medical therapy: Maximal Therapy (2 or more classes of medications)  Non-Invasive Test Results: No non-invasive testing performed  Prior CABG: No previous CABG  Tiffany [name]

## 2017-12-25 NOTE — Research (Signed)
OPTIMIZE Informed Consent   Subject Name: Tiffany [name]  Subject met inclusion and exclusion criteria.  The informed consent form, study requirements and expectations were reviewed with the subject and questions and concerns were addressed prior to the signing of the consent form.  The subject verbalized understanding of the trail requirements.  The subject agreed to participate in the OPTIMIZE trial and signed the informed consent.  The informed consent was obtained prior to performance of any protocol-specific procedures for the subject.  A copy of the signed informed consent was given to the subject and a copy was placed in the subject's medical record. Applicable if randomized.  Hedrick,Wallace Cogliano W 12/25/2017, 12:45

## 2017-12-25 NOTE — Telephone Encounter (Signed)
-----   Message from Nelva Bush, MD sent at 12/25/2017  5:00 PM EDT ----- Livia Snellen,  Ms. Lovelady's cath did not show any significant CAD.  LVEDP was a little high, so it may be worthwhile getting an echo.  I did not do an LVgram due to her chronic kidney disease.  Thanks.  Gerald Stabs

## 2017-12-25 NOTE — Brief Op Note (Signed)
BRIEF CARDIAC CATHETERIZATION NOTE  DATE: 12/25/2017 TIME: 3:59 PM  PATIENT:  Tiffany [name]  67 y.o. female  PRE-OPERATIVE DIAGNOSIS:  angina  POST-OPERATIVE DIAGNOSIS:  same  PROCEDURE:  Procedure(s): LEFT HEART CATH AND CORONARY ANGIOGRAPHY (N/A)  SURGEON:  Surgeon(s) and Role:    * Sheniya Garciaperez, Harrell Gave, MD - Primary  FINDING: 1. No angiographically significant CAD. 2. Upper nomral to mildly elevated LVEDP.  RECOMMENDATIONS: 1. Medical therapy.  Nelva Bush, MD [facility] Pager: [phone]

## 2017-12-26 ENCOUNTER — Encounter (HOSPITAL_COMMUNITY): Payer: Self-pay | Admitting: Internal Medicine

## 2017-12-26 NOTE — Telephone Encounter (Signed)
Pt has been scheduled for echo 12/30/17. Pt thanked me for the call.

## 2017-12-29 MED FILL — Heparin Sod (Porcine)-NaCl IV Soln 1000 Unit/500ML-0.9%: INTRAVENOUS | Qty: 1000 | Status: AC

## 2017-12-30 ENCOUNTER — Ambulatory Visit (HOSPITAL_COMMUNITY): Payer: Medicare Other | Attending: Cardiovascular Disease

## 2017-12-30 ENCOUNTER — Other Ambulatory Visit: Payer: Self-pay

## 2017-12-30 DIAGNOSIS — N189 Chronic kidney disease, unspecified: Secondary | ICD-10-CM | POA: Diagnosis not present

## 2017-12-30 DIAGNOSIS — I129 Hypertensive chronic kidney disease with stage 1 through stage 4 chronic kidney disease, or unspecified chronic kidney disease: Secondary | ICD-10-CM | POA: Diagnosis not present

## 2017-12-30 DIAGNOSIS — E1122 Type 2 diabetes mellitus with diabetic chronic kidney disease: Secondary | ICD-10-CM | POA: Insufficient documentation

## 2017-12-30 DIAGNOSIS — Z8249 Family history of ischemic heart disease and other diseases of the circulatory system: Secondary | ICD-10-CM | POA: Insufficient documentation

## 2017-12-30 DIAGNOSIS — I77810 Thoracic aortic ectasia: Secondary | ICD-10-CM | POA: Diagnosis not present

## 2017-12-30 DIAGNOSIS — E785 Hyperlipidemia, unspecified: Secondary | ICD-10-CM | POA: Insufficient documentation

## 2017-12-30 DIAGNOSIS — I071 Rheumatic tricuspid insufficiency: Secondary | ICD-10-CM | POA: Insufficient documentation

## 2017-12-30 DIAGNOSIS — R0602 Shortness of breath: Secondary | ICD-10-CM | POA: Diagnosis not present

## 2017-12-30 DIAGNOSIS — G4733 Obstructive sleep apnea (adult) (pediatric): Secondary | ICD-10-CM | POA: Insufficient documentation

## 2017-12-31 ENCOUNTER — Encounter: Payer: Self-pay | Admitting: Physician Assistant

## 2018-01-06 ENCOUNTER — Ambulatory Visit: Payer: Medicare Other | Admitting: Physician Assistant

## 2018-01-06 ENCOUNTER — Encounter: Payer: Self-pay | Admitting: Physician Assistant

## 2018-01-06 VITALS — BP 112/62 | HR 54 | Ht 63.0 in | Wt 171.0 lb

## 2018-01-06 DIAGNOSIS — I10 Essential (primary) hypertension: Secondary | ICD-10-CM | POA: Diagnosis not present

## 2018-01-06 DIAGNOSIS — R0789 Other chest pain: Secondary | ICD-10-CM | POA: Diagnosis not present

## 2018-01-06 MED ORDER — METOPROLOL SUCCINATE ER 50 MG PO TB24: 50.0000 mg | ORAL_TABLET | Freq: Every day | ORAL | 3 refills | Status: DC

## 2018-01-06 NOTE — Patient Instructions (Signed)
Medication Instructions:  1. DECREASE TOPROL XL TO 50 MG DAILY ; NEW RX HAS BEEN SENT IN FOR THE NEW DOSE  Labwork: NONE ORDERED TODAY  Testing/Procedures: NONE ORDERED TODAY  Follow-Up: Your physician wants you to follow-up in: 6 MONTHS WITH DR. ROSS  You will receive a reminder letter in the mail two months in advance. If you don't receive a letter, please call our office to schedule the follow-up appointment.   Any Other Special Instructions Will Be Listed Below (If Applicable).     If you need a refill on your cardiac medications before your next appointment, please call your pharmacy.

## 2018-01-06 NOTE — Progress Notes (Signed)
Cardiology Office Note:    Date:  01/06/2018   ID:  Tiffany [name], DOB Dec 27, 1950, MRN [id number]  PCP:  Prince Solian, MD  Cardiologist:  Dorris Carnes, MD  Neurologist: Larey Seat, MD Pulmonologist: Kara Mead, MD Gastroenterologist: Scarlette Shorts, MD Nephrologist:  Katrine Coho, MD  Referring MD: Prince Solian, MD   Chief Complaint  Patient presents with  . Hospitalization Follow-up    s/p cardiac cath    History of Present Illness:    Tiffany [name] is a 67 y.o. female with coronary calcification on prior CT, palpitations, diabetes, hypertension, hyperlipidemia, sleep apnea on CPAP bronchiectasis, GERD status post Nissen fundoplication.    She was evaluated on December 23, 2017 for chest discomfort consistent with angina.  Cardiac catheterization was arranged.  This demonstrated no angiographic CAD.  LVEDP was mildly elevated at 70 mmHg.  Follow-up echocardiogram demonstrated normal LV function with mild diastolic dysfunction.  Tiffany [name] returns for follow up.  She is here alone.  She has had less chest pain since her Cardiac Catheterization.  However, she still notes some discomfort in her chest described as tightness.  This occurs after lying down at times.  It is not severe.  She has mild shortness of breath without significant change. She denies paroxysmal nocturnal dyspnea, edema, syncope.  She does get lightheaded at times.  Prior CV studies:   The following studies were reviewed today:  Echo 12/30/2017 Mild focal basal septal hypertrophy, EF 60-65, normal wall motion, grade 1 diastolic dysfunction, mildly dilated ascending aorta (38 mm), normal RVSF, mild TR, PASP 23  Cardiac catheterization 12/25/2017 No angiographic CAD Mildly elevated LVEDP (17 mmHg)  Event monitor 12/21/15 No arrhythmias noted Sinus rhythm  Nuclear stress test 04/05/15 EF 64.  Low risk stress nuclear study with normal perfusion and normal left ventricular regional and global systolic  function.  Nuclear stress test 10/23/10 IMPRESSION: Normal myocardial perfusion examination. No evidence for ischemia or reversible defect.  Past Medical History:  Diagnosis Date  . Allergy   . Anemia    pernicious anemia- recieved iron infusions until 10-2015  . Anginal pain ([place])   . Anxiety   . Arthritis    foot drop  lt foot  / toes   wears brace     . Bronchiectasis ([place])   . Cancer ([employer])    SKIN    . Cataract    REMOVED  . Chronic cough   . CKD (chronic kidney disease)   . Colon polyps   . Coronary artery calcification seen on CT scan 12/22/2017   CT in 2016  . Depression   . Diabetes mellitus   . Dysphagia   . Dysrhythmia    RAPID HEART BEAT , CHEST TIGHTNESS  09/29/14     . Gastritis   . GERD (gastroesophageal reflux disease)   . Hiatal hernia   . History of echocardiogram    Echo 5/19: Mild focal basal septal hypertrophy, EF 60-65, normal wall motion, grade 1 diastolic dysfunction, mildly dilated ascending aorta (38 mm), normal RVSF, mild TR, PASP 23  . Hyperlipidemia   . Hypertension   . IBS (irritable bowel syndrome)   . Insomnia   . Kidney stones    KIDNEY DISEASE   STAGE lll    . Kidney stones   . Macular disorder    MACULAR DISEASE  RT EYE   . Meningitis   . Osteoporosis   . Pneumonia    BRONCHIECTASIS  . Shortness of breath dyspnea  WITH EXERTION  . Sleep apnea    uses cpap  . Spina bifida occulta   . Ulcer    Surgical Hx: The patient  has a past surgical history that includes Back surgery; Abdominal hysterectomy; Tonsillectomy; Polypectomy; Meniscus repair; fundoplication; Lithotripsy; Elbow surgery; Cataract extraction (03/2012); Colonoscopy; Knee arthroscopy; eardrum; Total knee arthroplasty (Right, 10/11/2014); and LEFT HEART CATH AND CORONARY ANGIOGRAPHY (N/A, 12/25/2017).   Current Medications: Current Meds  Medication Sig  . acetaminophen (TYLENOL) 500 MG tablet Take 1,000 mg by mouth every 8 (eight) hours as needed for mild pain or  headache.   Marland Kitchen amLODipine (NORVASC) 5 MG tablet Take 5 mg by mouth daily.   Marland Kitchen atorvastatin (LIPITOR) 40 MG tablet Take 1 tablet (40 mg total) by mouth daily. (Patient taking differently: Take 40 mg by mouth every evening. )  . calcium carbonate (CALCIUM 600) 600 MG TABS tablet Take 600 mg by mouth daily with breakfast.  . Cholecalciferol (VITAMIN D PO) Take 5,000 Units by mouth daily.   . diazepam (VALIUM) 10 MG tablet Take 10 mg by mouth at bedtime as needed for anxiety or sleep.   Marland Kitchen dicyclomine (BENTYL) 10 MG capsule Take 10 mg by mouth 3 (three) times daily as needed for spasms.  . diphenhydrAMINE (BENADRYL) 25 mg capsule Take 25 mg by mouth every 6 (six) hours as needed for allergies.  . DULoxetine (CYMBALTA) 30 MG capsule Take 30 mg by mouth daily.   Marland Kitchen EPIPEN 2-PAK 0.3 MG/0.3ML SOAJ injection Inject 0.3 mg into the muscle once.   . furosemide (LASIX) 40 MG tablet Take 40 mg by mouth daily as needed for fluid or edema.   Marland Kitchen loperamide (IMODIUM) 2 MG capsule Take 2 mg by mouth as needed for diarrhea or loose stools.  Marland Kitchen loratadine (CLARITIN) 10 MG tablet Take 10 mg by mouth daily as needed for allergies.   Marland Kitchen meclizine (ANTIVERT) 25 MG tablet Take 25 mg by mouth every 6 (six) hours as needed for dizziness.   Marland Kitchen NITROSTAT 0.4 MG SL tablet Place 0.4 mg under the tongue every 5 (five) minutes as needed for chest pain.   Vladimir Faster Glycol-Propyl Glycol (SYSTANE ULTRA) 0.4-0.3 % SOLN Place 2 drops into both eyes 2 (two) times daily.   . promethazine (PHENERGAN) 25 MG tablet Take 25 mg by mouth every 6 (six) hours as needed for nausea or vomiting.   Marland Kitchen Respiratory Therapy Supplies (FLUTTER) DEVI Use as directed  . [DISCONTINUED] metoprolol succinate (TOPROL-XL) 100 MG 24 hr tablet Take 100 mg by mouth every evening.      Allergies:   Ambien [zolpidem tartrate]; Aspirin; Benicar [olmesartan medoxomil]; Codeine; Erythromycin; Ketorolac; Ketorolac tromethamine; Latex; Lisinopril; Olmesartan; Other; Reclast  [zoledronic acid]; Toradol [ketorolac tromethamine]; Zolpidem; Fenofibrate; Glucosamine; Glucosamine forte [nutritional supplements]; Levaquin [levofloxacin in d5w]; Levofloxacin; Percocet [oxycodone-acetaminophen]; Tramadol; Lomotil [diphenoxylate-atropine]; and Tizanidine   Social History   Tobacco Use  . Smoking status: Never Smoker  . Smokeless tobacco: Never Used  Substance Use Topics  . Alcohol use: No    Comment: OCC.  . Drug use: No     Family Hx: The patient's family history includes Alzheimer's disease in her father; Cancer in her mother; Colon cancer in her maternal grandmother and mother; Coronary artery disease in her unknown relative; Diabetes in her mother; Esophageal cancer in her cousin and cousin; Gout in her father; Heart attack (age of onset: 61) in her maternal grandmother; Heart attack (age of onset: 6) in her maternal grandfather; Hypertension in her  mother; Irritable bowel syndrome in her father; Nephrolithiasis in her brother and mother; Ovarian cancer in her mother; Thyroid cancer in her sister; Ulcers in her father. There is no history of Colon polyps, Rectal cancer, or Stomach cancer.  ROS:   Please see the history of present illness.    ROS All other systems reviewed and are negative.   EKGs/Labs/Other Test Reviewed:    EKG:  EKG is  ordered today.  The ekg ordered today demonstrates sinus brady, HR 54, normal axis, QTc 468 ms, PAC  Recent Labs: 05/01/2017: BUN 18; Creatinine, Ser 1.24; Hemoglobin 11.9; Platelets 193.0; Potassium 4.2; Sodium 141   Recent Lipid Panel Lab Results  Component Value Date/Time   CHOL 165 02/29/2016 08:19 AM   TRIG 279 (H) 02/29/2016 08:19 AM   HDL 42 (L) 02/29/2016 08:19 AM   CHOLHDL 3.9 02/29/2016 08:19 AM   LDLCALC 67 02/29/2016 08:19 AM   LDLDIRECT 94.0 05/05/2015 10:38 AM    Physical Exam:    VS:  BP 112/62   Pulse (!) 54   Ht 5\' 3"  (1.6 m)   Wt 171 lb (77.6 kg)   SpO2 99%   BMI 30.29 kg/m     Wt Readings  from Last 3 Encounters:  01/06/18 171 lb (77.6 kg)  12/25/17 169 lb (76.7 kg)  12/23/17 174 lb (78.9 kg)     Physical Exam  Constitutional: She is oriented to person, place, and time. She appears well-developed and well-nourished. No distress.  HENT:  Head: Normocephalic and atraumatic.  Neck: Neck supple.  Cardiovascular: Normal rate, regular rhythm, S1 normal, S2 normal and normal heart sounds.  No murmur heard. Pulmonary/Chest: Effort normal. She has no rales.  Abdominal: Soft.  Musculoskeletal: She exhibits no edema.  R wrist without hematoma  Neurological: She is alert and oriented to person, place, and time.  Skin: Skin is warm and dry.    ASSESSMENT & PLAN:    Other chest pain Her Cardiac Catheterization did not demonstrate any CAD.  Her LVEDP was mildly elevated.  Her EF is normal by echocardiogram with mild diastolic dysfunction.  She has had issues with acute kidney injury in the past with taking regular diuretics.  She does not appear volume overloaded on exam.  She can take Lasix as needed, but I do not think she should take it on a daily or regular basis.  She still has some chest pain that is improved.  She may have vasospasm.  But, her BP is running low and I would not add long acting nitrates at this point.  I will adjust her beta-blocker as noted below.  If her BP starts to run too high and she continues to have chest pain, we should consider adding Isosorbide.  Essential hypertension She gets dizzy at times and her HR is in the 50s.  She has lost weight recently and her blood pressure is running lower.    -Decrease Metoprolol succinate to 50 mg Once daily    Dispo:  Return in about 6 months (around 07/09/2018) for Routine Follow Up, w/ Dr. Harrington Challenger.   Medication Adjustments/Labs and Tests Ordered: Current medicines are reviewed at length with the patient today.  Concerns regarding medicines are outlined above.  Tests Ordered: Orders Placed This Encounter  Procedures   . EKG 12-Lead   Medication Changes: Meds ordered this encounter  Medications  . metoprolol succinate (TOPROL-XL) 50 MG 24 hr tablet    Sig: Take 1 tablet (50 mg total) by mouth  daily. Take with or immediately following a meal.    Dispense:  90 tablet    Refill:  3    DECREASE IN DOSE    Danton Sewer, PA-C  01/06/2018 9:24 AM    [facility] [place], [place],   [address] Phone: [phone]; Fax: [phone]

## 2018-03-09 ENCOUNTER — Ambulatory Visit: Payer: Medicare Other | Admitting: Neurology

## 2018-07-08 ENCOUNTER — Encounter: Payer: Self-pay | Admitting: Internal Medicine

## 2018-08-07 ENCOUNTER — Ambulatory Visit: Payer: Medicare Other | Admitting: Internal Medicine

## 2018-08-07 ENCOUNTER — Encounter: Payer: Self-pay | Admitting: Internal Medicine

## 2018-08-07 VITALS — BP 134/76 | HR 70 | Ht 63.0 in | Wt 174.0 lb

## 2018-08-07 DIAGNOSIS — R002 Palpitations: Secondary | ICD-10-CM

## 2018-08-07 DIAGNOSIS — I2510 Atherosclerotic heart disease of native coronary artery without angina pectoris: Secondary | ICD-10-CM | POA: Diagnosis not present

## 2018-08-07 DIAGNOSIS — I10 Essential (primary) hypertension: Secondary | ICD-10-CM | POA: Diagnosis not present

## 2018-08-07 MED ORDER — METOPROLOL SUCCINATE ER 50 MG PO TB24: 50.0000 mg | ORAL_TABLET | Freq: Two times a day (BID) | ORAL | 3 refills | Status: AC

## 2018-08-07 NOTE — Patient Instructions (Signed)
Medication Instructions:  Your physician has recommended you make the following change in your medication:  1.) increase Toprol XL (metoprolol succinate) 50 mg to twice daily.  If you need a refill on your cardiac medications before your next appointment, please call your pharmacy.   Lab work: none If you have labs (blood work) drawn today and your tests are completely normal, you will receive your results only by: Marland Kitchen MyChart Message (if you have MyChart) OR . A paper copy in the mail If you have any lab test that is abnormal or we need to change your treatment, we will call you to review the results.  Testing/Procedures: none  Follow-Up: Follow up in 4-6 weeks.  We will call you with an appointment.  Any Other Special Instructions Will Be Listed Below (If Applicable).

## 2018-08-07 NOTE — Progress Notes (Addendum)
Cardiology Office Note   Date:  08/07/2018   ID:  Tiffany [name], DOB 10-Apr-1951, MRN [id number]  PCP:  Tiffany Solian, MD  Cardiologist:   Tiffany Carnes, MD   Pt presents for f/u pf palpitations    History of Present Illness: Tiffany [name] is a 67 y.o. female with a history of bronchiectaisis, HTN, spina bifida, achalasia, palpitations and CP    I saw her in Aug 2016   Myovue was norma   The pt returns today because she had an episode of her heart racing  Pt had an episode of heart racing about 140  Lasted about 30 min  Laid down and it went away  Prior to spell she had felt good     [place] indoor track    BP has been up at times   She is active   Denies CP   Breathing is OK other than allergies      Outpatient Medications Prior to Visit  Medication Sig Dispense Refill  . acetaminophen (TYLENOL) 500 MG tablet Take 1,000 mg by mouth every 8 (eight) hours as needed for mild pain or headache.     Marland Kitchen amLODipine (NORVASC) 5 MG tablet Take 5 mg by mouth daily.     Marland Kitchen atorvastatin (LIPITOR) 40 MG tablet Take 1 tablet (40 mg total) by mouth daily. (Patient taking differently: Take 40 mg by mouth every evening. ) 90 tablet 3  . calcium carbonate (CALCIUM 600) 600 MG TABS tablet Take 600 mg by mouth daily with breakfast.    . Cholecalciferol (VITAMIN D PO) Take 5,000 Units by mouth daily.     . diazepam (VALIUM) 10 MG tablet Take 10 mg by mouth at bedtime as needed for anxiety or sleep.     Marland Kitchen dicyclomine (BENTYL) 10 MG capsule Take 10 mg by mouth 3 (three) times daily as needed for spasms.    . diphenhydrAMINE (BENADRYL) 25 mg capsule Take 25 mg by mouth every 6 (six) hours as needed for allergies.    . DULoxetine (CYMBALTA) 30 MG capsule Take 30 mg by mouth daily.   6  . EPIPEN 2-PAK 0.3 MG/0.3ML SOAJ injection Inject 0.3 mg into the muscle once.   0  . furosemide (LASIX) 40 MG tablet Take 40 mg by mouth daily as needed for fluid or edema.     Marland Kitchen loperamide (IMODIUM) 2 MG capsule  Take 2 mg by mouth as needed for diarrhea or loose stools.    Marland Kitchen loratadine (CLARITIN) 10 MG tablet Take 10 mg by mouth daily as needed for allergies.     Marland Kitchen meclizine (ANTIVERT) 25 MG tablet Take 25 mg by mouth every 6 (six) hours as needed for dizziness.   0  . NITROSTAT 0.4 MG SL tablet Place 0.4 mg under the tongue every 5 (five) minutes as needed for chest pain.   3  . Polyethyl Glycol-Propyl Glycol (SYSTANE ULTRA) 0.4-0.3 % SOLN Place 2 drops into both eyes 2 (two) times daily.     . promethazine (PHENERGAN) 25 MG tablet Take 25 mg by mouth every 6 (six) hours as needed for nausea or vomiting.     Marland Kitchen Respiratory Therapy Supplies (FLUTTER) DEVI Use as directed 1 each 0  . metoprolol succinate (TOPROL-XL) 50 MG 24 hr tablet Take 1 tablet (50 mg total) by mouth daily. Take with or immediately following a meal. 90 tablet 3   No facility-administered medications prior to visit.  Allergies:   Ambien [zolpidem tartrate]; Aspirin; Benicar [olmesartan medoxomil]; Codeine; Erythromycin; Ketorolac; Ketorolac tromethamine; Latex; Lisinopril; Olmesartan; Other; Reclast [zoledronic acid]; Toradol [ketorolac tromethamine]; Zolpidem; Fenofibrate; Glucosamine; Glucosamine forte [nutritional supplements]; Levaquin [levofloxacin in d5w]; Levofloxacin; Percocet [oxycodone-acetaminophen]; Tramadol; Lomotil [diphenoxylate-atropine]; and Tizanidine   Past Medical History:  Diagnosis Date  . Allergy   . Anemia    pernicious anemia- recieved iron infusions until 10-2015  . Anginal pain ([place])   . Anxiety   . Arthritis    foot drop  lt foot  / toes   wears brace     . Bronchiectasis ([place])   . Cancer ([place])    SKIN    . Cataract    REMOVED  . Chronic cough   . CKD (chronic kidney disease)   . Colon polyps   . Coronary artery calcification seen on CT scan 12/22/2017   CT in 2016  . Depression   . Diabetes mellitus   . Dysphagia   . Dysrhythmia    RAPID HEART BEAT , CHEST TIGHTNESS  09/29/14     .  Gastritis   . GERD (gastroesophageal reflux disease)   . Hiatal hernia   . History of echocardiogram    Echo 5/19: Mild focal basal septal hypertrophy, EF 60-65, normal wall motion, grade 1 diastolic dysfunction, mildly dilated ascending aorta (38 mm), normal RVSF, mild TR, PASP 23  . Hyperlipidemia   . Hypertension   . IBS (irritable bowel syndrome)   . Insomnia   . Kidney stones    KIDNEY DISEASE   STAGE lll    . Kidney stones   . Macular disorder    MACULAR DISEASE  RT EYE   . Meningitis   . Osteoporosis   . Pneumonia    BRONCHIECTASIS  . Shortness of breath dyspnea    WITH EXERTION  . Sleep apnea    uses cpap  . Spina bifida occulta   . Ulcer     Past Surgical History:  Procedure Laterality Date  . ABDOMINAL HYSTERECTOMY    . BACK SURGERY    . CATARACT EXTRACTION  03/2012   bilateral  . COLONOSCOPY    . eardrum     left   ruptured and repaired  . ELBOW SURGERY     left  . fundoplication    . KNEE ARTHROSCOPY     rt knee   . LEFT HEART CATH AND CORONARY ANGIOGRAPHY N/A 12/25/2017   Procedure: LEFT HEART CATH AND CORONARY ANGIOGRAPHY;  Surgeon: Nelva Bush, MD;  Location: [facility];  Service: Cardiovascular;  Laterality: N/A;  . LITHOTRIPSY    . MENISCUS REPAIR    . POLYPECTOMY    . TONSILLECTOMY    . TOTAL KNEE ARTHROPLASTY Right 10/11/2014   Procedure: RIGHT TOTAL KNEE ARTHROPLASTY;  Surgeon: Mcarthur Rossetti, MD;  Location: [facility];  Service: Orthopedics;  Laterality: Right;     Social History:  The patient  reports that she has never smoked. She has never used smokeless tobacco. She reports that she does not drink alcohol or use drugs.   Family History:  The patient's family history includes Alzheimer's disease in her father; Cancer in her mother; Colon cancer in her maternal grandmother and mother; Coronary artery disease in her unknown relative; Diabetes in her mother; Esophageal cancer in her cousin and cousin; Gout in her father; Heart  attack (age of onset: 55) in her maternal grandmother; Heart attack (age of onset: 65) in her maternal grandfather; Hypertension in  her mother; Irritable bowel syndrome in her father; Nephrolithiasis in her brother and mother; Ovarian cancer in her mother; Thyroid cancer in her sister; Ulcers in her father.    ROS:  Please see the history of present illness. All other systems are reviewed and  Negative to the above problem except as noted.    PHYSICAL EXAM: VS:  BP 134/76   Pulse 70   Ht 5\' 3"  (1.6 m)   Wt 174 lb (78.9 kg)   SpO2 98%   BMI 30.82 kg/m   GEN: Well nourished, well developed, in no acute distress  HEENT: normal  Neck: no JVD, carotid bruits, or masses Cardiac: RRR; no murmurs, rubs, or gallops,no edema  Respiratory:  clear to auscultation bilaterally, normal work of breathing GI: soft, nontender, nondistended, + BS  No hepatomegaly  MS: no deformity Moving all extremities   Skin: warm and dry, no rash Neuro:  Strength and sensation are intact Psych: euthymic mood, full affect   EKG:  EKG is nor ordered today.   Lipid Panel    Component Value Date/Time   CHOL 165 02/29/2016 0819   TRIG 279 (H) 02/29/2016 0819   HDL 42 (L) 02/29/2016 0819   CHOLHDL 3.9 02/29/2016 0819   VLDL 56 (H) 02/29/2016 0819   LDLCALC 67 02/29/2016 0819   LDLDIRECT 94.0 05/05/2015 1038      Wt Readings from Last 3 Encounters:  08/07/18 174 lb (78.9 kg)  01/06/18 171 lb (77.6 kg)  12/25/17 169 lb (76.7 kg)      ASSESSMENT AND PLAN:  1  Palpitations   Will set up for event monitor  ? afib   2.  HL  Stop Simvistatin  Start Crestor 20  F/U lpids in 2 months  Watch carbs    3  CADNo symptoms to suggest angina    4  CP  Denies       Signed, Tiffany Carnes, MD  08/07/2018 4:38 PM    [facility] [address], [place], [place]  [address] Phone: [phone]; Fax: [phone]

## 2018-08-17 ENCOUNTER — Ambulatory Visit
Admission: RE | Admit: 2018-08-17 | Discharge: 2018-08-17 | Disposition: A | Discharge status: Home / Self Care | Payer: Medicare Other | Source: Ambulatory Visit | Attending: Internal Medicine | Admitting: Internal Medicine

## 2018-08-17 ENCOUNTER — Other Ambulatory Visit: Payer: Self-pay | Admitting: Internal Medicine

## 2018-08-17 DIAGNOSIS — R1011 Right upper quadrant pain: Secondary | ICD-10-CM

## 2018-08-17 MED ORDER — IOPAMIDOL (ISOVUE-300) INJECTION 61%
75.0000 mL | Freq: Once | INTRAVENOUS | Status: AC | PRN
  Administered 2018-08-17: 75 mL via INTRAVENOUS

## 2018-08-24 ENCOUNTER — Telehealth: Payer: Self-pay | Admitting: Internal Medicine

## 2018-08-24 NOTE — Telephone Encounter (Signed)
New Message:     Pt called and said she saw Dr Harrington Challenger on 08-07-18 and she told her to follow up in 6 weeks. Dr Harrington Challenger is nott here in 6 weeks, of course I offered the patient appointment with a PA. She did not want the appointment, she said Dr Harrington Challenger wanted to see her. I told her Dr Harrington Challenger was not in the office. She wanted me to let the  nurse  know about this and check with Dr Harrington Challenger, because she knows Dr Harrington Challenger wants to see her and not a PA.

## 2018-08-24 NOTE — Telephone Encounter (Signed)
Spoke with pt and scheduled her to see Dr. Harrington Challenger 1/10

## 2018-08-25 ENCOUNTER — Ambulatory Visit
Admission: RE | Admit: 2018-08-25 | Discharge: 2018-08-25 | Disposition: A | Discharge status: Home / Self Care | Payer: Medicare Other | Source: Ambulatory Visit | Attending: Internal Medicine | Admitting: Internal Medicine

## 2018-08-25 DIAGNOSIS — R1011 Right upper quadrant pain: Secondary | ICD-10-CM

## 2018-09-01 ENCOUNTER — Ambulatory Visit: Payer: Medicare Other | Admitting: Internal Medicine

## 2018-09-01 ENCOUNTER — Encounter: Payer: Self-pay | Admitting: Internal Medicine

## 2018-09-01 VITALS — BP 108/70 | HR 69 | Ht 63.0 in | Wt 176.8 lb

## 2018-09-01 DIAGNOSIS — E782 Mixed hyperlipidemia: Secondary | ICD-10-CM

## 2018-09-01 DIAGNOSIS — I10 Essential (primary) hypertension: Secondary | ICD-10-CM

## 2018-09-01 DIAGNOSIS — R002 Palpitations: Secondary | ICD-10-CM

## 2018-09-01 MED ORDER — AMLODIPINE BESYLATE 5 MG PO TABS: 5.0000 mg | ORAL_TABLET | Freq: Two times a day (BID) | ORAL | 3 refills | Status: DC

## 2018-09-01 NOTE — Patient Instructions (Signed)
Medication Instructions:  1.) increase amlodipine to 5 mg TWICE A DAY If you need a refill on your cardiac medications before your next appointment, please call your pharmacy.   Lab work: none If you have labs (blood work) drawn today and your tests are completely normal, you will receive your results only by: Marland Kitchen MyChart Message (if you have MyChart) OR . A paper copy in the mail If you have any lab test that is abnormal or we need to change your treatment, we will call you to review the results.  Testing/Procedures: none  Follow-Up: At [facility], you and your health needs are our priority.  As part of our continuing mission to provide you with exceptional heart care, we have created designated Provider Care Teams.  These Care Teams include your primary Cardiologist (physician) and Advanced Practice Providers (APPs -  Physician Assistants and Nurse Practitioners) who all work together to provide you with the care you need, when you need it. You will need a follow up appointment in:  9 months.  Please call our office 2 months in advance to schedule this appointment.  You may see Dorris Carnes, MD or one of the following Advanced Practice Providers on your designated Care Team: Richardson Dopp, PA-C [place], [place] . Daune Perch, NP  Any Other Special Instructions Will Be Listed Below (If Applicable).

## 2018-09-01 NOTE — Progress Notes (Signed)
Cardiology Office Note   Date:  09/01/2018   ID:  Tiffany [name], DOB 03-Jan-1951, MRN [id number]  PCP:  Prince Solian, MD  Cardiologist:   Dorris Carnes, MD   Pt presents for f/u pf palpitations    History of Present Illness: Tiffany [name] is a 68 y.o. female with a history of bronchiectaisis, HTN, spina bifida, achalasia, palpitations and CP  In 2016  Myovue was norma   I saw the pt in clinic in December 2019  At that time she had ane episode of heart racing  HR up to  140  Lasted about 30 min  Laid down and it went away  Prior to spell she had felt good    At taht visit I recomm increasing her Toprol XL to BID sInce seen she has done OK    She is active   Denies CP   Breathing is OK  She has taken BP daily   SBP has been mostly 130s to 150   Outpatient Medications Prior to Visit  Medication Sig Dispense Refill  . acetaminophen (TYLENOL) 500 MG tablet Take 1,000 mg by mouth every 8 (eight) hours as needed for mild pain or headache.     Marland Kitchen amLODipine (NORVASC) 5 MG tablet Take 5 mg by mouth daily.     Marland Kitchen atorvastatin (LIPITOR) 40 MG tablet Take 1 tablet (40 mg total) by mouth daily. 90 tablet 3  . calcium carbonate (CALCIUM 600) 600 MG TABS tablet Take 600 mg by mouth daily with breakfast.    . Cholecalciferol (VITAMIN D PO) Take 5,000 Units by mouth daily.     . diazepam (VALIUM) 10 MG tablet Take 10 mg by mouth at bedtime as needed for anxiety or sleep.     Marland Kitchen dicyclomine (BENTYL) 10 MG capsule Take 10 mg by mouth 3 (three) times daily as needed for spasms.    . diphenhydrAMINE (BENADRYL) 25 mg capsule Take 25 mg by mouth every 6 (six) hours as needed for allergies.    . DULoxetine (CYMBALTA) 30 MG capsule Take 30 mg by mouth daily.   6  . EPIPEN 2-PAK 0.3 MG/0.3ML SOAJ injection Inject 0.3 mg into the muscle once.   0  . furosemide (LASIX) 40 MG tablet Take 40 mg by mouth daily as needed for fluid or edema.     Marland Kitchen loperamide (IMODIUM) 2 MG capsule Take 2 mg by mouth as needed  for diarrhea or loose stools.    Marland Kitchen loratadine (CLARITIN) 10 MG tablet Take 10 mg by mouth daily as needed for allergies.     Marland Kitchen meclizine (ANTIVERT) 25 MG tablet Take 25 mg by mouth every 6 (six) hours as needed for dizziness.   0  . metoprolol succinate (TOPROL-XL) 50 MG 24 hr tablet Take 1 tablet (50 mg total) by mouth 2 (two) times daily. Take with or immediately following a meal. 180 tablet 3  . NITROSTAT 0.4 MG SL tablet Place 0.4 mg under the tongue every 5 (five) minutes as needed for chest pain.   3  . Polyethyl Glycol-Propyl Glycol (SYSTANE ULTRA) 0.4-0.3 % SOLN Place 2 drops into both eyes 2 (two) times daily.     . promethazine (PHENERGAN) 25 MG tablet Take 25 mg by mouth every 6 (six) hours as needed for nausea or vomiting.     Marland Kitchen Respiratory Therapy Supplies (FLUTTER) DEVI Use as directed 1 each 0   No facility-administered medications prior to visit.  Allergies:   Ambien [zolpidem tartrate]; Aspirin; Benicar [olmesartan medoxomil]; Codeine; Erythromycin; Ketorolac; Ketorolac tromethamine; Latex; Lisinopril; Olmesartan; Other; Reclast [zoledronic acid]; Toradol [ketorolac tromethamine]; Zolpidem; Fenofibrate; Glucosamine; Glucosamine forte [nutritional supplements]; Levaquin [levofloxacin in d5w]; Levofloxacin; Percocet [oxycodone-acetaminophen]; Tramadol; Lomotil [diphenoxylate-atropine]; and Tizanidine   Past Medical History:  Diagnosis Date  . Allergy   . Anemia    pernicious anemia- recieved iron infusions until 10-2015  . Anginal pain ([place])   . Anxiety   . Arthritis    foot drop  lt foot  / toes   wears brace     . Bronchiectasis ([place])   . Cancer ([place])    SKIN    . Cataract    REMOVED  . Chronic cough   . CKD (chronic kidney disease)   . Colon polyps   . Coronary artery calcification seen on CT scan 12/22/2017   CT in 2016  . Depression   . Diabetes mellitus   . Dysphagia   . Dysrhythmia    RAPID HEART BEAT , CHEST TIGHTNESS  09/29/14     . Gastritis   . GERD  (gastroesophageal reflux disease)   . Hiatal hernia   . History of echocardiogram    Echo 5/19: Mild focal basal septal hypertrophy, EF 60-65, normal wall motion, grade 1 diastolic dysfunction, mildly dilated ascending aorta (38 mm), normal RVSF, mild TR, PASP 23  . Hyperlipidemia   . Hypertension   . IBS (irritable bowel syndrome)   . Insomnia   . Kidney stones    KIDNEY DISEASE   STAGE lll    . Kidney stones   . Macular disorder    MACULAR DISEASE  RT EYE   . Meningitis   . Osteoporosis   . Pneumonia    BRONCHIECTASIS  . Shortness of breath dyspnea    WITH EXERTION  . Sleep apnea    uses cpap  . Spina bifida occulta   . Ulcer     Past Surgical History:  Procedure Laterality Date  . ABDOMINAL HYSTERECTOMY    . BACK SURGERY    . CATARACT EXTRACTION  03/2012   bilateral  . COLONOSCOPY    . eardrum     left   ruptured and repaired  . ELBOW SURGERY     left  . fundoplication    . KNEE ARTHROSCOPY     rt knee   . LEFT HEART CATH AND CORONARY ANGIOGRAPHY N/A 12/25/2017   Procedure: LEFT HEART CATH AND CORONARY ANGIOGRAPHY;  Surgeon: Nelva Bush, MD;  Location: [facility];  Service: Cardiovascular;  Laterality: N/A;  . LITHOTRIPSY    . MENISCUS REPAIR    . POLYPECTOMY    . TONSILLECTOMY    . TOTAL KNEE ARTHROPLASTY Right 10/11/2014   Procedure: RIGHT TOTAL KNEE ARTHROPLASTY;  Surgeon: Mcarthur Rossetti, MD;  Location: [facility];  Service: Orthopedics;  Laterality: Right;     Social History:  The patient  reports that she has never smoked. She has never used smokeless tobacco. She reports that she does not drink alcohol or use drugs.   Family History:  The patient's family history includes Alzheimer's disease in her father; Cancer in her mother; Colon cancer in her maternal grandmother and mother; Coronary artery disease in her unknown relative; Diabetes in her mother; Esophageal cancer in her cousin and cousin; Gout in her father; Heart attack (age of onset:  60) in her maternal grandmother; Heart attack (age of onset: 84) in her maternal grandfather; Hypertension in  her mother; Irritable bowel syndrome in her father; Nephrolithiasis in her brother and mother; Ovarian cancer in her mother; Thyroid cancer in her sister; Ulcers in her father.    ROS:  Please see the history of present illness. All other systems are reviewed and  Negative to the above problem except as noted.    PHYSICAL EXAM: VS:  BP 108/70   Pulse 69   Ht 5\' 3"  (1.6 m)   Wt 176 lb 12.8 oz (80.2 kg)   SpO2 94%   BMI 31.32 kg/m    BP on my check   140/80 R arm   136/76 L arm  GEN:  Obese 68 yo , in no acute distress  HEENT: normal  Neck: no JVD, carotid bruits, or masses Cardiac: RRR; no murmurs, rubs, or gallops,no edema  Respiratory:  clear to auscultation bilaterally, normal work of breathing GI: soft, nontender, nondistended, + BS  No hepatomegaly  MS: no deformity Moving all extremities   Skin: warm and dry, no rash Neuro:  Strength and sensation are intact Psych: euthymic mood, full affect   EKG:  EKG is nor ordered today.   Lipid Panel    Component Value Date/Time   CHOL 165 02/29/2016 0819   TRIG 279 (H) 02/29/2016 0819   HDL 42 (L) 02/29/2016 0819   CHOLHDL 3.9 02/29/2016 0819   VLDL 56 (H) 02/29/2016 0819   LDLCALC 67 02/29/2016 0819   LDLDIRECT 94.0 05/05/2015 1038      Wt Readings from Last 3 Encounters:  09/01/18 176 lb 12.8 oz (80.2 kg)  08/07/18 174 lb (78.9 kg)  01/06/18 171 lb (77.6 kg)      ASSESSMENT AND PLAN:  1  Palpitations  Denies   If recurrswill set up for event monitor   2.  HL  Get labs through primary MD    3  HTN   Would increase amlodipine to 5 bid   Follow   3  CAD No symptoms to suggest angina         Signed, Dorris Carnes, MD  09/01/2018 10:33 AM    [facility] [place], [place],   [address] Phone: [phone]; Fax: [phone]

## 2018-09-04 ENCOUNTER — Ambulatory Visit: Payer: Medicare Other | Admitting: Internal Medicine

## 2018-10-06 ENCOUNTER — Ambulatory Visit: Payer: Medicare Other | Admitting: Internal Medicine

## 2018-10-06 ENCOUNTER — Encounter: Payer: Self-pay | Admitting: Internal Medicine

## 2018-10-06 VITALS — BP 150/88 | HR 76 | Ht 63.0 in | Wt 175.0 lb

## 2018-10-06 DIAGNOSIS — R4702 Dysphasia: Secondary | ICD-10-CM | POA: Diagnosis not present

## 2018-10-06 DIAGNOSIS — R197 Diarrhea, unspecified: Secondary | ICD-10-CM

## 2018-10-06 DIAGNOSIS — K219 Gastro-esophageal reflux disease without esophagitis: Secondary | ICD-10-CM | POA: Diagnosis not present

## 2018-10-06 DIAGNOSIS — R159 Full incontinence of feces: Secondary | ICD-10-CM

## 2018-10-06 MED ORDER — METRONIDAZOLE 250 MG PO TABS: 250.0000 mg | ORAL_TABLET | Freq: Three times a day (TID) | ORAL | 3 refills | Status: DC

## 2018-10-06 NOTE — Patient Instructions (Signed)
We have sent the following medications to your pharmacy for you to pick up at your convenience:  Metronidazole  Continue taking your Imodium  Please follow up as needed

## 2018-10-07 ENCOUNTER — Encounter: Payer: Self-pay | Admitting: Internal Medicine

## 2018-10-07 NOTE — Progress Notes (Signed)
HISTORY OF PRESENT ILLNESS:  Tiffany [name] is a 68 y.o. female with multiple medical problems including spina bifida with occult spinal dysraphism (status post T12-T5 laminectomies), hypertension, hyperlipidemia, kidney stones, depression, renal insufficiency, and GERD status post fundoplication.  Patient was last seen in this office June 03, 2016 regarding chronic diarrhea, anemia, and a personal history of adenomatous colon polyps as well as abdominal pain.  She subsequently underwent colonoscopy and upper endoscopy June 20, 2016.  Upper endoscopy was normal post fundoplication.  Duodenal biopsies were normal.  Colonoscopy revealed sigmoid diverticulosis, hemorrhoids, and a diminutive colon polyp which was removed and found to be hyperplastic.  Random colon biopsies were normal.  She was empirically prescribed metronidazole which helped her diarrhea.  She contacted the office at one point thereafter and was retreated, with again a good response.  Patient has not been seen since.  Her chief complaints today are diarrhea with incontinence as well as vague pill dysphasia.  Review of outside x-rays shows that the patient did undergo a CT scan of the abdomen and pelvis with contrast August 17, 2018 to evaluate right-sided abdominal pain and nausea.  No acute findings identified though possibly a mild enteritis.  Abdominal ultrasound August 25, 2018 was negative for gallstones.  Changes of fatty liver mentioned.  Patient tells me that her problems with loose stools have been worse over the past 6 months.  She does use Imodium which helps.  She will experience incontinence if her bowels are loose.  She denies fever or bleeding.  Problems are similar to previous issues with diarrhea and incontinence.  She also has some bloating and belching.  She mentions minor pill dysphasia without coughing or choking.  Does not seem to have issues with solid food or liquids.  REVIEW OF SYSTEMS:  All non-GI ROS negative  unless otherwise stated in the HPI except for fatigue, back pain, anxiety, sinus allergy, shortness of breath at times  Past Medical History:  Diagnosis Date  . Allergy   . Anemia    pernicious anemia- recieved iron infusions until 10-2015  . Anginal pain ([place])   . Anxiety   . Arthritis    foot drop  lt foot  / toes   wears brace     . Bronchiectasis ([place])   . Cancer ([place])    SKIN    . Cataract    REMOVED  . Chronic cough   . CKD (chronic kidney disease)   . Colon polyps   . Coronary artery calcification seen on CT scan 12/22/2017   CT in 2016  . Depression   . Diabetes mellitus   . Dysphagia   . Dysrhythmia    RAPID HEART BEAT , CHEST TIGHTNESS  09/29/14     . Gastritis   . GERD (gastroesophageal reflux disease)   . Hiatal hernia   . History of echocardiogram    Echo 5/19: Mild focal basal septal hypertrophy, EF 60-65, normal wall motion, grade 1 diastolic dysfunction, mildly dilated ascending aorta (38 mm), normal RVSF, mild TR, PASP 23  . Hyperlipidemia   . Hypertension   . IBS (irritable bowel syndrome)   . Insomnia   . Kidney stones    KIDNEY DISEASE   STAGE lll    . Kidney stones   . Macular disorder    MACULAR DISEASE  RT EYE   . Meningitis   . Osteoporosis   . Pneumonia    BRONCHIECTASIS  . Shortness of breath dyspnea  WITH EXERTION  . Sleep apnea    uses cpap  . Spina bifida occulta   . Ulcer     Past Surgical History:  Procedure Laterality Date  . ABDOMINAL HYSTERECTOMY    . BACK SURGERY    . CATARACT EXTRACTION  03/2012   bilateral  . COLONOSCOPY    . eardrum     left   ruptured and repaired  . ELBOW SURGERY     left  . fundoplication    . KNEE ARTHROSCOPY     rt knee   . LEFT HEART CATH AND CORONARY ANGIOGRAPHY N/A 12/25/2017   Procedure: LEFT HEART CATH AND CORONARY ANGIOGRAPHY;  Surgeon: Nelva Bush, MD;  Location: [facility];  Service: Cardiovascular;  Laterality: N/A;  . LITHOTRIPSY    . MENISCUS REPAIR    . POLYPECTOMY     . TONSILLECTOMY    . TOTAL KNEE ARTHROPLASTY Right 10/11/2014   Procedure: RIGHT TOTAL KNEE ARTHROPLASTY;  Surgeon: Mcarthur Rossetti, MD;  Location: [facility];  Service: Orthopedics;  Laterality: Right;    Social History Tiffany [name]  reports that she has never smoked. She has never used smokeless tobacco. She reports that she does not drink alcohol or use drugs.  family history includes Alzheimer's disease in her father; Cancer in her mother; Colon cancer in her maternal grandmother and mother; Coronary artery disease in an other family member; Diabetes in her mother; Esophageal cancer in her cousin and cousin; Gout in her father; Heart attack (age of onset: 6) in her maternal grandmother; Heart attack (age of onset: 63) in her maternal grandfather; Hypertension in her mother; Irritable bowel syndrome in her father; Nephrolithiasis in her brother and mother; Ovarian cancer in her mother; Thyroid cancer in her sister; Ulcers in her father.  Allergies  Allergen Reactions  . Ambien [Zolpidem Tartrate] Other (See Comments)    Nightmares and insomnia  . Aspirin Other (See Comments)    GI bleed  . Benicar [Olmesartan Medoxomil] Shortness Of Breath, Swelling and Other (See Comments)    Swelling of throat  . Codeine Nausea And Vomiting  . Erythromycin Nausea And Vomiting  . Ketorolac Anaphylaxis  . Ketorolac Tromethamine Anaphylaxis  . Latex Shortness Of Breath and Itching  . Lisinopril Shortness Of Breath, Swelling and Other (See Comments)     Swelling of throat  . Olmesartan Other (See Comments), Shortness Of Breath and Swelling    Swelling of throat  . Other Anaphylaxis    Peaches and mangoes, tree nuts, tomatoes, shellfish, Cat dandor, green peas  . Reclast [Zoledronic Acid] Other (See Comments)    Ocular reaction, toxic  . Toradol [Ketorolac Tromethamine] Anaphylaxis  . Zolpidem Other (See Comments)    Nightmares and insomnia  . Fenofibrate Nausea And Vomiting  . Glucosamine  Diarrhea and Nausea Only  . Glucosamine Forte [Nutritional Supplements] Diarrhea and Nausea Only  . Levaquin [Levofloxacin In D5w] Diarrhea and Swelling  . Levofloxacin Diarrhea and Swelling  . Percocet [Oxycodone-Acetaminophen] Nausea And Vomiting  . Tramadol Nausea And Vomiting  . Lomotil [Diphenoxylate-Atropine] Other (See Comments)    Hallucinations   . Tizanidine Nausea And Vomiting       PHYSICAL EXAMINATION: Vital signs: BP (!) 150/88   Pulse 76   Ht 5\' 3"  (1.6 m)   Wt 175 lb (79.4 kg)   BMI 31.00 kg/m   Constitutional: generally well-appearing, no acute distress Psychiatric: alert and oriented x3, cooperative Eyes: extraocular movements intact, anicteric, conjunctiva pink Mouth:  oral pharynx moist, no lesions Neck: supple no lymphadenopathy Cardiovascular: heart regular rate and rhythm, no murmur Lungs: clear to auscultation bilaterally Abdomen: soft, nontender, nondistended, no obvious ascites, no peritoneal signs, normal bowel sounds, no organomegaly Rectal: Omitted Extremities: no, cyanosis, or lower extremity edema bilaterally Skin: no lesions on visible extremities Neuro: Alert and oriented.  Cranial nerves intact  ASSESSMENT:  1.  Problems with diarrhea.  Possibly bacterial overgrowth given prior response to metronidazole. 2.  Incontinence.  Manifested in the face of diarrhea.  Colonoscopy with negative biopsies for the same 2017 3.  Vague pill dysphasia.  Negative upper endoscopy post fundoplication 3154.  Advised to take pills with plenty of water or ask for smaller pill alternatives if available 4.  Multiple medical problems  PLAN:  1.  Prescribe metronidazole 250 mg 3 times daily for 10 days.  Several refills given as she may use this on demand if this is effective 2.  If metronidazole is not effective then we discussed treatment with Colestid 2 g twice daily. 3.  Patient will contact the office in a few weeks to give [place] follow-up 4.  Office follow-up 6  months 25-minute spent face-to-face with the patient.  Greater than 50% of the time used for counseling regarding her issues with diarrhea, incontinence, and pill dysphasia

## 2018-10-13 ENCOUNTER — Other Ambulatory Visit: Payer: Self-pay | Admitting: Internal Medicine

## 2018-10-13 DIAGNOSIS — Z1231 Encounter for screening mammogram for malignant neoplasm of breast: Secondary | ICD-10-CM

## 2018-11-23 ENCOUNTER — Ambulatory Visit: Payer: Medicare Other

## 2018-12-18 ENCOUNTER — Ambulatory Visit: Payer: Medicare Other

## 2019-02-04 ENCOUNTER — Other Ambulatory Visit: Payer: Self-pay

## 2019-02-04 ENCOUNTER — Ambulatory Visit
Admission: RE | Admit: 2019-02-04 | Discharge: 2019-02-04 | Disposition: A | Discharge status: Home / Self Care | Payer: Medicare Other | Source: Ambulatory Visit | Attending: Internal Medicine | Admitting: Internal Medicine

## 2019-02-04 DIAGNOSIS — Z1231 Encounter for screening mammogram for malignant neoplasm of breast: Secondary | ICD-10-CM

## 2019-06-03 NOTE — Progress Notes (Signed)
Cardiology Office Note   Date:  06/04/2019   ID:  Tiffany [name], DOB 08/24/1951, MRN [id number]  PCP:  Tiffany Solian, MD  Cardiologist:   Dorris Carnes, MD   Pt presents for f/u pf palpitations    History of Present Illness: Tiffany [name] is a 68 y.o. female with a history of bronchiectaisis, HTN, spina bifida, achalasia, palpitations and CP  In 2016  Myovue was norma   I saw the pt in clinic in December 2019  At that time she had ane episode of heart racing  HR up to  140  Lasted about 30 min  Laid down and it went away  Prior to spell she had felt good    At taht visit I recomm increasing her Toprol XL to BID  I saw the pt in Jan 2020  She was doing good     Sicne seen she has done well  Breathnig is OK  No CP  BP readkings from home 110s to 150   Average 130s     Stil with edema in legs   Worse as day goes not   Outpatient Medications Prior to Visit  Medication Sig Dispense Refill  . acetaminophen (TYLENOL) 500 MG tablet Take 1,000 mg by mouth every 8 (eight) hours as needed for mild pain or headache.     Marland Kitchen amLODipine (NORVASC) 5 MG tablet Take 1 tablet (5 mg total) by mouth 2 (two) times daily. 180 tablet 3  . atorvastatin (LIPITOR) 40 MG tablet Take 1 tablet (40 mg total) by mouth daily. 90 tablet 3  . calcium carbonate (CALCIUM 600) 600 MG TABS tablet Take 600 mg by mouth daily with breakfast.    . Cholecalciferol (VITAMIN D PO) Take 5,000 Units by mouth daily.     . diazepam (VALIUM) 10 MG tablet Take 10 mg by mouth at bedtime as needed for anxiety or sleep.     . diphenhydrAMINE (BENADRYL) 25 mg capsule Take 25 mg by mouth every 6 (six) hours as needed for allergies.    . DULoxetine (CYMBALTA) 30 MG capsule Take 30 mg by mouth daily.   6  . EPIPEN 2-PAK 0.3 MG/0.3ML SOAJ injection Inject 0.3 mg into the muscle once.   0  . furosemide (LASIX) 40 MG tablet Take 40 mg by mouth daily as needed for fluid or edema.     Marland Kitchen loperamide (IMODIUM) 2 MG capsule Take 2  mg by mouth as needed for diarrhea or loose stools.    Marland Kitchen loratadine (CLARITIN) 10 MG tablet Take 10 mg by mouth daily as needed for allergies.     . metFORMIN (GLUCOPHAGE-XR) 500 MG 24 hr tablet TK 1 T PO BID    . metoprolol succinate (TOPROL-XL) 50 MG 24 hr tablet Take 1 tablet (50 mg total) by mouth 2 (two) times daily. Take with or immediately following a meal. 180 tablet 3  . NITROSTAT 0.4 MG SL tablet Place 0.4 mg under the tongue every 5 (five) minutes as needed for chest pain.   3  . Polyethyl Glycol-Propyl Glycol (SYSTANE ULTRA) 0.4-0.3 % SOLN Place 2 drops into both eyes 2 (two) times daily.     . promethazine (PHENERGAN) 25 MG tablet Take 25 mg by mouth every 6 (six) hours as needed for nausea or vomiting.     Marland Kitchen Respiratory Therapy Supplies (FLUTTER) DEVI Use as directed 1 each 0  . albuterol (VENTOLIN HFA) 108 (90 Base) MCG/ACT inhaler INL 2 PFS PO  Q 4 H PRF WHZ OR SOB    . dicyclomine (BENTYL) 10 MG capsule Take 10 mg by mouth 3 (three) times daily as needed for spasms.    . meclizine (ANTIVERT) 25 MG tablet Take 25 mg by mouth every 6 (six) hours as needed for dizziness.   0  . metroNIDAZOLE (FLAGYL) 250 MG tablet Take 1 tablet (250 mg total) by mouth 3 (three) times daily. (Patient not taking: Reported on 06/04/2019) 30 tablet 3   No facility-administered medications prior to visit.      Allergies:   Ambien [zolpidem tartrate], Aspirin, Benicar [olmesartan medoxomil], Codeine, Erythromycin, Ketorolac, Ketorolac tromethamine, Latex, Lisinopril, Olmesartan, Other, Reclast [zoledronic acid], Toradol [ketorolac tromethamine], Zolpidem, Fenofibrate, Glucosamine, Glucosamine forte [nutritional supplements], Levaquin [levofloxacin in d5w], Levofloxacin, Percocet [oxycodone-acetaminophen], Tramadol, Lomotil [diphenoxylate-atropine], and Tizanidine   Past Medical History:  Diagnosis Date  . Allergy   . Anemia    pernicious anemia- recieved iron infusions until 10-2015  . Anginal pain ([place])    . Anxiety   . Arthritis    foot drop  lt foot  / toes   wears brace     . Bronchiectasis ([place])   . Cancer ([place])    SKIN    . Cataract    REMOVED  . Chronic cough   . CKD (chronic kidney disease)   . Colon polyps   . Coronary artery calcification seen on CT scan 12/22/2017   CT in 2016  . Depression   . Diabetes mellitus   . Dysphagia   . Dysrhythmia    RAPID HEART BEAT , CHEST TIGHTNESS  09/29/14     . Gastritis   . GERD (gastroesophageal reflux disease)   . Hiatal hernia   . History of echocardiogram    Echo 5/19: Mild focal basal septal hypertrophy, EF 60-65, normal wall motion, grade 1 diastolic dysfunction, mildly dilated ascending aorta (38 mm), normal RVSF, mild TR, PASP 23  . Hyperlipidemia   . Hypertension   . IBS (irritable bowel syndrome)   . Insomnia   . Kidney stones    KIDNEY DISEASE   STAGE lll    . Kidney stones   . Macular disorder    MACULAR DISEASE  RT EYE   . Meningitis   . Osteoporosis   . Pneumonia    BRONCHIECTASIS  . Shortness of breath dyspnea    WITH EXERTION  . Sleep apnea    uses cpap  . Spina bifida occulta   . Ulcer     Past Surgical History:  Procedure Laterality Date  . ABDOMINAL HYSTERECTOMY    . BACK SURGERY    . CATARACT EXTRACTION  03/2012   bilateral  . COLONOSCOPY    . eardrum     left   ruptured and repaired  . ELBOW SURGERY     left  . fundoplication    . KNEE ARTHROSCOPY     rt knee   . LEFT HEART CATH AND CORONARY ANGIOGRAPHY N/A 12/25/2017   Procedure: LEFT HEART CATH AND CORONARY ANGIOGRAPHY;  Surgeon: Nelva Bush, MD;  Location: [facility];  Service: Cardiovascular;  Laterality: N/A;  . LITHOTRIPSY    . MENISCUS REPAIR    . POLYPECTOMY    . TONSILLECTOMY    . TOTAL KNEE ARTHROPLASTY Right 10/11/2014   Procedure: RIGHT TOTAL KNEE ARTHROPLASTY;  Surgeon: Mcarthur Rossetti, MD;  Location: [facility];  Service: Orthopedics;  Laterality: Right;     Social History:  The patient  reports that she has  never smoked. She has never used smokeless tobacco. She reports that she does not drink alcohol or use drugs.   Family History:  The patient's family history includes Alzheimer's disease in her father; Cancer in her mother; Colon cancer in her maternal grandmother and mother; Coronary artery disease in an other family member; Diabetes in her mother; Esophageal cancer in her cousin and cousin; Gout in her father; Heart attack (age of onset: 67) in her maternal grandmother; Heart attack (age of onset: 38) in her maternal grandfather; Hypertension in her mother; Irritable bowel syndrome in her father; Nephrolithiasis in her brother and mother; Ovarian cancer in her mother; Thyroid cancer in her sister; Ulcers in her father.    ROS:  Please see the history of present illness. All other systems are reviewed and  Negative to the above problem except as noted.    PHYSICAL EXAM: VS:  BP 132/80   Pulse 68   Ht 5\' 3"  (1.6 m)   Wt 184 lb (83.5 kg)   BMI 32.59 kg/m   GEN:  Obese 67 yo , in no acute distress  HEENT: normal  Neck: no JVD, carotid bruits, or masses Cardiac: RRR; no murmurs, rubs, or gallops,1+ edema ankles and feet   Respiratory:  clear to auscultation bilaterally, normal work of breathing GI: soft, nontender, nondistended, + BS  No hepatomegaly  MS: no deformity Moving all extremities   Skin: warm and dry, Sl erythema in legs  Neuro:  Strength and sensation are intact Psych: euthymic mood, full affect   EKG:  EKG is ordered   SR 68 bpm     Lipid Panel    Component Value Date/Time   CHOL 165 02/29/2016 0819   TRIG 279 (H) 02/29/2016 0819   HDL 42 (L) 02/29/2016 0819   CHOLHDL 3.9 02/29/2016 0819   VLDL 56 (H) 02/29/2016 0819   LDLCALC 67 02/29/2016 0819   LDLDIRECT 94.0 05/05/2015 1038      Wt Readings from Last 3 Encounters:  06/04/19 184 lb (83.5 kg)  10/06/18 175 lb (79.4 kg)  09/01/18 176 lb 12.8 oz (80.2 kg)      ASSESSMENT AND PLAN:  1  Palpitations  Denies     2  Edema  Echo normal  ? Related to amlodipine   Will cut to 5 daily  Told her to write in my chart how BP responds   WIll need to change regimen if goes up   2.  HL  Get labs from Dr Danna Hefty office     3  HTN   Cut back on amlodipine  For edma  May need to select anotehr agent    3  CAD No symptoms to suggest angina         Signed, Dorris Carnes, MD  06/04/2019 [address], [place], [place]  [address] Phone: [phone]; Fax: [phone]

## 2019-06-04 ENCOUNTER — Encounter: Payer: Self-pay | Admitting: Internal Medicine

## 2019-06-04 ENCOUNTER — Other Ambulatory Visit: Payer: Self-pay

## 2019-06-04 ENCOUNTER — Ambulatory Visit: Payer: Medicare Other | Admitting: Internal Medicine

## 2019-06-04 VITALS — BP 132/80 | HR 68 | Ht 63.0 in | Wt 184.0 lb

## 2019-06-04 DIAGNOSIS — I10 Essential (primary) hypertension: Secondary | ICD-10-CM | POA: Diagnosis not present

## 2019-06-04 MED ORDER — AMLODIPINE BESYLATE 5 MG PO TABS: 5.0000 mg | ORAL_TABLET | Freq: Every day | ORAL | 3 refills | Status: DC

## 2019-06-04 NOTE — Patient Instructions (Addendum)
Medication Instructions:  Your physician has recommended you make the following change in your medication:  1.) decrease amlodipine to 5 mg daily  If you need a refill on your cardiac medications before your next appointment, please call your pharmacy.   Lab work: none If you have labs (blood work) drawn today and your tests are completely normal, you will receive your results only by: Marland Kitchen MyChart Message (if you have MyChart) OR . A paper copy in the mail If you have any lab test that is abnormal or we need to change your treatment, we will call you to review the results.  Testing/Procedures: none  Follow-Up: At [facility], you and your health needs are our priority.  As part of our continuing mission to provide you with exceptional heart care, we have created designated Provider Care Teams.  These Care Teams include your primary Cardiologist (physician) and Advanced Practice Providers (APPs -  Physician Assistants and Nurse Practitioners) who all work together to provide you with the care you need, when you need it. You will need a follow up appointment in:  9 months.  Please call our office 2 months in advance to schedule this appointment.  You may see Dorris Carnes, MD or one of the following Advanced Practice Providers on your designated Care Team: Richardson Dopp, PA-C [place], [place] . Daune Perch, NP  Any Other Special Instructions Will Be Listed Below (If Applicable).  Please call or use MyChart to send a message to Dr. Harrington Challenger re: swelling in your lower extremities after decreasing dose of amlodipine.

## 2019-06-23 ENCOUNTER — Telehealth: Payer: Self-pay | Admitting: *Deleted

## 2019-06-23 DIAGNOSIS — I10 Essential (primary) hypertension: Secondary | ICD-10-CM

## 2019-06-23 MED ORDER — TRIAMTERENE-HCTZ 37.5-25 MG PO TABS: 0.5000 | ORAL_TABLET | Freq: Every day | ORAL | 6 refills | Status: DC

## 2019-06-23 NOTE — Telephone Encounter (Signed)
WOuld recomm Maxzide 37.5/25     Start at 1/2 tab per day.   Follow BP for a few wks    SHould have BMET in about 10 days

## 2019-06-23 NOTE — Telephone Encounter (Signed)
Message from Dr. Harrington Challenger in response to patient's MyChart message:  WOuld recomm Maxzide 37.5/25     Start at 1/2 tab per day.   Follow BP for a few wks    SHould have BMET in about 10 days  Called patient and informed. She will start Maxzide 37.5/25 --half tab daily and come for labs on 07/05/19.  Will record daily BPs and call or MyChart them to Dr. Harrington Challenger in 2-3 weeks.

## 2019-07-05 ENCOUNTER — Other Ambulatory Visit: Payer: Medicare Other | Admitting: *Deleted

## 2019-07-05 ENCOUNTER — Other Ambulatory Visit: Payer: Self-pay

## 2019-07-05 DIAGNOSIS — I10 Essential (primary) hypertension: Secondary | ICD-10-CM

## 2019-07-06 LAB — BASIC METABOLIC PANEL
BUN/Creatinine Ratio: 20 (ref 12–28)
BUN: 30 mg/dL — ABNORMAL HIGH (ref 8–27)
CO2: 17 mmol/L — ABNORMAL LOW (ref 20–29)
Calcium: 9.4 mg/dL (ref 8.7–10.3)
Chloride: 106 mmol/L (ref 96–106)
Creatinine, Ser: 1.53 mg/dL — ABNORMAL HIGH (ref 0.57–1.00)
GFR calc Af Amer: 40 mL/min/{1.73_m2} — ABNORMAL LOW (ref >59)
GFR calc non Af Amer: 35 mL/min/{1.73_m2} — ABNORMAL LOW (ref >59)
Glucose: 148 mg/dL — ABNORMAL HIGH (ref 65–99)
Potassium: 4.8 mmol/L (ref 3.5–5.2)
Sodium: 142 mmol/L (ref 134–144)

## 2019-07-09 ENCOUNTER — Telehealth: Payer: Self-pay | Admitting: Internal Medicine

## 2019-07-09 DIAGNOSIS — I10 Essential (primary) hypertension: Secondary | ICD-10-CM

## 2019-07-09 MED ORDER — HYDRALAZINE HCL 25 MG PO TABS: 25.0000 mg | ORAL_TABLET | Freq: Three times a day (TID) | ORAL | 3 refills | Status: DC

## 2019-07-09 NOTE — Telephone Encounter (Signed)
Spoke with patient and informed of recommendations and is aware to stop Maxzide and will start hydralazine 25 mg three times daily.  Will monitor BP at home and send list of readings to Dr. Harrington Challenger via [place].  Lab appointment for 07/26/19 scheduled.  Copy to [facility] at pt's request.

## 2019-07-09 NOTE — Telephone Encounter (Signed)
Patient is calling for her lab results.  

## 2019-07-26 ENCOUNTER — Other Ambulatory Visit: Payer: Self-pay

## 2019-07-26 ENCOUNTER — Other Ambulatory Visit: Payer: Medicare Other | Admitting: *Deleted

## 2019-07-26 ENCOUNTER — Encounter: Payer: Self-pay | Admitting: Primary Care

## 2019-07-26 ENCOUNTER — Ambulatory Visit (INDEPENDENT_AMBULATORY_CARE_PROVIDER_SITE_OTHER): Payer: Medicare Other | Admitting: Primary Care

## 2019-07-26 DIAGNOSIS — J471 Bronchiectasis with (acute) exacerbation: Secondary | ICD-10-CM

## 2019-07-26 DIAGNOSIS — I10 Essential (primary) hypertension: Secondary | ICD-10-CM

## 2019-07-26 MED ORDER — GUAIFENESIN ER 600 MG PO TB12: 1200.0000 mg | ORAL_TABLET | Freq: Two times a day (BID) | ORAL | 0 refills | Status: DC

## 2019-07-26 MED ORDER — DOXYCYCLINE HYCLATE 100 MG PO TABS: 100.0000 mg | ORAL_TABLET | Freq: Two times a day (BID) | ORAL | 0 refills | Status: DC

## 2019-07-26 NOTE — Progress Notes (Signed)
Virtual Visit via Telephone Note  I connected with Tiffany [name] on 07/26/19 at  2:00 PM EST by telephone and verified that I am speaking with the correct person using two identifiers.  Location: Patient: Home Provider: Office   I discussed the limitations, risks, security and privacy concerns of performing an evaluation and management service by telephone and the availability of in person appointments. I also discussed with the patient that there may be a patient responsible charge related to this service. The patient expressed understanding and agreed to proceed.   History of Present Illness: 68 year old of female, never smoked. PMH significant for bronchiectasis and OSA on CPAP. Patient of Dr. Elsworth Soho, last seen on 05/01/17.   07/26/2019 Patient contacted today for acute televisit. Developed shortness of breath and fatigue on Saturday 07/24/19. Associated cough with deep green mucus, chest discomfort with breathing. Not using her flutter valve as consistently as she should be. States that she went to [place] last week, saw her son and grandchildren. No know sick contact, recent travel or prolonged immobilization. Denies fever, sinus congestion, wheezing .    Observations/Objective:  O2 87-95% RA  Assessment and Plan:  Bronchiectasis exacerbation - Doxycycline 1 tab twice daily x 7 days - Increased mucinex 1,200mg  twice daily - Use flutter valve three times a day - Recommend covid testing - If not improvement with above plan recommend CXR and labs   Follow Up Instructions:  -If symptoms do not improvem in 5-7 days or worsen   I discussed the assessment and treatment plan with the patient. The patient was provided an opportunity to ask questions and all were answered. The patient agreed with the plan and demonstrated an understanding of the instructions.   The patient was advised to call back or seek an in-person evaluation if the symptoms worsen or if the condition fails to  improve as anticipated.  I provided 18 minutes of non-face-to-face time during this encounter.   Martyn Ehrich, NP

## 2019-07-26 NOTE — Patient Instructions (Signed)
Plan: Doxycycline 1 tab twice daily x 7 days Double strength mucinex twice daily x 2 weeks Use flutter valve three times a day for congestion  Testing for covid ([address])  Follow-up: If symptoms do not improvem in 5-7 days or worsen

## 2019-07-27 ENCOUNTER — Other Ambulatory Visit: Payer: Self-pay

## 2019-07-27 DIAGNOSIS — Z20822 Contact with and (suspected) exposure to covid-19: Secondary | ICD-10-CM

## 2019-07-27 LAB — BASIC METABOLIC PANEL
BUN/Creatinine Ratio: 14 (ref 12–28)
BUN: 19 mg/dL (ref 8–27)
CO2: 26 mmol/L (ref 20–29)
Calcium: 9.6 mg/dL (ref 8.7–10.3)
Chloride: 103 mmol/L (ref 96–106)
Creatinine, Ser: 1.36 mg/dL — ABNORMAL HIGH (ref 0.57–1.00)
GFR calc Af Amer: 46 mL/min/{1.73_m2} — ABNORMAL LOW (ref >59)
GFR calc non Af Amer: 40 mL/min/{1.73_m2} — ABNORMAL LOW (ref >59)
Glucose: 99 mg/dL (ref 65–99)
Potassium: 4.7 mmol/L (ref 3.5–5.2)
Sodium: 144 mmol/L (ref 134–144)

## 2019-07-28 ENCOUNTER — Telehealth: Payer: Self-pay

## 2019-07-28 NOTE — Telephone Encounter (Signed)
-----   Message from Dorris Carnes V, MD sent at 07/27/2019  8:45 PM EST ----- Cr is a little better      Forward to PCP Dr Dagmar Hait Pt is now on hydralazine  Need to watch BP Cr will need to be followed   Stay hydrated

## 2019-07-28 NOTE — Telephone Encounter (Signed)
The patient has been notified of the result and verbalized understanding.  All questions (if any) were answered. Wilma Flavin, RN 07/28/2019 10:30 AM

## 2019-07-29 LAB — NOVEL CORONAVIRUS, NAA: SARS-CoV-2, NAA: NOT DETECTED

## 2019-07-29 NOTE — Progress Notes (Signed)
Covid negative

## 2019-07-30 NOTE — Progress Notes (Signed)
Called spoke with patient, advised of lab results / recs as stated by Beth NP.  Pt verbalized understanding and denied any questions. 

## 2019-08-03 ENCOUNTER — Encounter: Payer: Self-pay | Admitting: Internal Medicine

## 2019-08-03 ENCOUNTER — Ambulatory Visit: Payer: Medicare Other | Admitting: Internal Medicine

## 2019-08-03 ENCOUNTER — Other Ambulatory Visit: Payer: Self-pay

## 2019-08-03 VITALS — BP 120/80 | HR 68 | Temp 97.2°F | Ht 63.0 in | Wt 182.4 lb

## 2019-08-03 DIAGNOSIS — R197 Diarrhea, unspecified: Secondary | ICD-10-CM | POA: Diagnosis not present

## 2019-08-03 DIAGNOSIS — R159 Full incontinence of feces: Secondary | ICD-10-CM | POA: Diagnosis not present

## 2019-08-03 MED ORDER — COLESTIPOL HCL 1 G PO TABS: 2.0000 g | ORAL_TABLET | Freq: Two times a day (BID) | ORAL | 5 refills | Status: DC

## 2019-08-03 NOTE — Patient Instructions (Signed)
We have sent the following medications to your pharmacy for you to pick up at your convenience: Colestid.   Call back in one month with any update on your symptoms.

## 2019-08-03 NOTE — Progress Notes (Signed)
HISTORY OF PRESENT ILLNESS:  Tiffany [name] is a 68 y.o. female with multiple medical problems who was last seen in the office February 2020 regarding chronic diarrhea and fecal incontinence.  See that dictation.  She was prescribed metronidazole, as this is helped previously.  She presents today for 78-month follow-up.  She tells me that she did not have a good response to metronidazole as previous.  She describes anywhere from 3-15 bowel movements per day.  Regular issues with incontinence results and the need for protective undergarments at all times.  She has treated herself with limited amounts of Imodium and Pepto-Bismol.  Imodium does seem to help though she will get some cramping abdominal discomfort.  She was recently seen by her primary provider on July 20, 2019.  I have reviewed that encounter.  Hemoglobin A1c was 6.2.  Her last creatinine July 26, 2019 was 1.36.  Her last complete colonoscopy with negative random colon biopsies October 2017.  REVIEW OF SYSTEMS:  All non-GI ROS negative unless otherwise stated in the HPI except for sleeping problem  Past Medical History:  Diagnosis Date  . Allergy   . Anemia    pernicious anemia- recieved iron infusions until 10-2015  . Anginal pain ([place])   . Anxiety   . Arthritis    foot drop  lt foot  / toes   wears brace     . Bronchiectasis ([place])   . Cancer ([place])    SKIN    . Cataract    REMOVED  . Chronic cough   . CKD (chronic kidney disease)   . Colon polyps   . Coronary artery calcification seen on CT scan 12/22/2017   CT in 2016  . Depression   . Diabetes mellitus   . Dysphagia   . Dysrhythmia    RAPID HEART BEAT , CHEST TIGHTNESS  09/29/14     . Gastritis   . GERD (gastroesophageal reflux disease)   . Hiatal hernia   . History of echocardiogram    Echo 5/19: Mild focal basal septal hypertrophy, EF 60-65, normal wall motion, grade 1 diastolic dysfunction, mildly dilated ascending aorta (38 mm), normal RVSF, mild TR,  PASP 23  . Hyperlipidemia   . Hypertension   . IBS (irritable bowel syndrome)   . Insomnia   . Kidney stones    KIDNEY DISEASE   STAGE lll    . Kidney stones   . Macular disorder    MACULAR DISEASE  RT EYE   . Meningitis   . Osteoporosis   . Pneumonia    BRONCHIECTASIS  . Shortness of breath dyspnea    WITH EXERTION  . Sleep apnea    uses cpap  . Spina bifida occulta   . Ulcer     Past Surgical History:  Procedure Laterality Date  . ABDOMINAL HYSTERECTOMY    . BACK SURGERY    . CATARACT EXTRACTION  03/2012   bilateral  . COLONOSCOPY    . eardrum     left   ruptured and repaired  . ELBOW SURGERY     left  . fundoplication    . KNEE ARTHROSCOPY     rt knee   . LEFT HEART CATH AND CORONARY ANGIOGRAPHY N/A 12/25/2017   Procedure: LEFT HEART CATH AND CORONARY ANGIOGRAPHY;  Surgeon: Nelva Bush, MD;  Location: [facility];  Service: Cardiovascular;  Laterality: N/A;  . LITHOTRIPSY    . MENISCUS REPAIR    . POLYPECTOMY    .  TONSILLECTOMY    . TOTAL KNEE ARTHROPLASTY Right 10/11/2014   Procedure: RIGHT TOTAL KNEE ARTHROPLASTY;  Surgeon: Mcarthur Rossetti, MD;  Location: [facility];  Service: Orthopedics;  Laterality: Right;    Social History Tiffany [name]  reports that she has never smoked. She has never used smokeless tobacco. She reports that she does not drink alcohol or use drugs.  family history includes Alzheimer's disease in her father; Cancer in her mother; Colon cancer in her maternal grandmother and mother; Coronary artery disease in an other family member; Diabetes in her mother; Esophageal cancer in her cousin and cousin; Gout in her father; Heart attack (age of onset: 47) in her maternal grandmother; Heart attack (age of onset: 50) in her maternal grandfather; Hypertension in her mother; Irritable bowel syndrome in her father; Nephrolithiasis in her brother and mother; Ovarian cancer in her mother; Thyroid cancer in her sister; Ulcers in her  father.  Allergies  Allergen Reactions  . Ambien [Zolpidem Tartrate] Other (See Comments)    Nightmares and insomnia  . Aspirin Other (See Comments)    GI bleed  . Benicar [Olmesartan Medoxomil] Shortness Of Breath, Swelling and Other (See Comments)    Swelling of throat  . Codeine Nausea And Vomiting  . Erythromycin Nausea And Vomiting  . Ketorolac Anaphylaxis  . Ketorolac Tromethamine Anaphylaxis  . Latex Shortness Of Breath and Itching  . Lisinopril Shortness Of Breath, Swelling and Other (See Comments)     Swelling of throat  . Olmesartan Other (See Comments), Shortness Of Breath and Swelling    Swelling of throat  . Other Anaphylaxis    Peaches and mangoes, tree nuts, tomatoes, shellfish, Cat dandor, green peas  . Reclast [Zoledronic Acid] Other (See Comments)    Ocular reaction, toxic  . Toradol [Ketorolac Tromethamine] Anaphylaxis  . Zolpidem Other (See Comments)    Nightmares and insomnia  . Fenofibrate Nausea And Vomiting  . Glucosamine Diarrhea and Nausea Only  . Glucosamine Forte [Nutritional Supplements] Diarrhea and Nausea Only  . Levaquin [Levofloxacin In D5w] Diarrhea and Swelling  . Levofloxacin Diarrhea and Swelling  . Percocet [Oxycodone-Acetaminophen] Nausea And Vomiting  . Tramadol Nausea And Vomiting  . Lomotil [Diphenoxylate-Atropine] Other (See Comments)    Hallucinations   . Tizanidine Nausea And Vomiting       PHYSICAL EXAMINATION: Vital signs: BP 120/80   Pulse 68   Temp (!) 97.2 F (36.2 C)   Ht 5\' 3"  (1.6 m)   Wt 182 lb 6.4 oz (82.7 kg)   BMI 32.31 kg/m   Constitutional: generally well-appearing, no acute distress Psychiatric: alert and oriented x3, cooperative Eyes: extraocular movements intact, anicteric, conjunctiva pink Mouth: oral pharynx moist, no lesions Neck: supple no lymphadenopathy Cardiovascular: heart regular rate and rhythm, no murmur Lungs: clear to auscultation bilaterally Abdomen: soft, nontender, nondistended, no  obvious ascites, no peritoneal signs, normal bowel sounds, no organomegaly Rectal: Omitted Extremities: no clubbing, cyanosis, or lower extremity edema bilaterally Skin: no lesions on visible extremities Neuro: Alert and oriented.  Cranial nerves intact  ASSESSMENT:  1.  Chronic diarrhea 2.  Fecal incontinence 3.  Multiple medical problems 4.  History of adenomatous colon polyps.  Surveillance up-to-date 5.  GERD with prior fundoplication   PLAN:  1.  Prescribe Colestid 2 g twice daily.  Medication effects, side effects, and risks reviewed.  Do not take within 2 hours of other medications.  Adjust if needed for constipation 2.  Discussed more liberal use of Imodium 3.  Continue with protective undergarments 4.  Contact this office in about 4 weeks for an update regarding her condition.

## 2019-08-04 ENCOUNTER — Telehealth: Payer: Self-pay | Admitting: Internal Medicine

## 2019-08-04 MED ORDER — COLESTIPOL HCL 1 G PO TABS: 2.0000 g | ORAL_TABLET | Freq: Two times a day (BID) | ORAL | 5 refills | Status: DC

## 2019-08-04 NOTE — Telephone Encounter (Signed)
Medication was, in fact, received by pharmacy yesterday at 12:12pm, however I resent it to be safe.

## 2019-08-04 NOTE — Telephone Encounter (Signed)
Pt called to inform that her pharmacy told her that they did not receive prescription for colestipol. Pls send it again.

## 2019-08-06 ENCOUNTER — Other Ambulatory Visit: Payer: Self-pay | Admitting: Internal Medicine

## 2019-08-06 ENCOUNTER — Other Ambulatory Visit: Payer: Self-pay

## 2019-08-06 ENCOUNTER — Encounter: Payer: Self-pay | Admitting: Primary Care

## 2019-08-06 ENCOUNTER — Ambulatory Visit (INDEPENDENT_AMBULATORY_CARE_PROVIDER_SITE_OTHER): Payer: Medicare Other | Admitting: Primary Care

## 2019-08-06 DIAGNOSIS — J471 Bronchiectasis with (acute) exacerbation: Secondary | ICD-10-CM

## 2019-08-06 MED ORDER — DOXYCYCLINE HYCLATE 100 MG PO TABS: 100.0000 mg | ORAL_TABLET | Freq: Two times a day (BID) | ORAL | 0 refills | Status: DC

## 2019-08-06 NOTE — Progress Notes (Signed)
Virtual Visit via Telephone Note  I connected with Tiffany [name] on 08/06/19 at  9:30 AM EST by telephone and verified that I am speaking with the correct person using two identifiers.  Location: Patient: Home Provider: Office    I discussed the limitations, risks, security and privacy concerns of performing an evaluation and management service by telephone and the availability of in person appointments. I also discussed with the patient that there may be a patient responsible charge related to this service. The patient expressed understanding and agreed to proceed.   History of Present Illness: 68 year old of female, never smoked. PMH significant for bronchiectasis and OSA on CPAP. Patient of Dr. Elsworth Soho. PRN albuterol hfa.   07/26/2019 Patient contacted today for acute televisit. Developed shortness of breath and fatigue on Saturday 07/24/19. Associated cough with deep green mucus, chest discomfort with breathing. Not using her flutter valve as consistently as she should be. States that she went to [place] last week, saw her son and grandchildren. No know sick contact, recent travel or prolonged immobilization. Denies fever, sinus congestion, wheezing .   08/06/2019  Patient contacted today for follow-up televisit for bronchiectasis exacerbation. She states that she was feeling better initially after completing doxycycline course but symptoms returned 2 days ago.  Feels her breathing and energy level are a lot better. She continues to cough up thick green/rubbery mucus. She is taking double strength mucinex twice daily as recommended. Denies fever or pleuritic pain.   Observations/Objective:  - No significant shortness of breath, cough or wheezing noted during phone call  Assessment and Plan:  Prolonged bronchiectasis exacerbation - Partially improved with doxycycline - RX doxycycline 1 tab twice daily for additional 7 days  - Continue Mucinex 1,200mg  twice daily with full glass  of water - Use Albuterol hfa 2 puffs every 6 hours as needed for sob/wheezing/cough - Recommend Cough/deep breathing exercises - If not resolved recommend sputum culture and CXR  Follow Up Instructions:   - As needed if symptoms do not improve or worsen  I discussed the assessment and treatment plan with the patient. The patient was provided an opportunity to ask questions and all were answered. The patient agreed with the plan and demonstrated an understanding of the instructions.   The patient was advised to call back or seek an in-person evaluation if the symptoms worsen or if the condition fails to improve as anticipated.  I provided 15 minutes of non-face-to-face time during this encounter.   Martyn Ehrich, NP

## 2019-08-06 NOTE — Patient Instructions (Signed)
  Prolonged bronchiectasis exacerbation - Partially improved with doxycycline - RX doxycycline 1 tab twice daily for additional 7 days  - Continue Mucinex 1,200mg  twice daily with full glass of water - Use Albuterol hfa 2 puffs every 6 hours as needed for sob/wheezing/cough - Recommend Cough/deep breathing exercises - If not resolved recommend sputum culture and CXR

## 2019-08-09 ENCOUNTER — Telehealth: Payer: Self-pay | Admitting: Primary Care

## 2019-08-09 ENCOUNTER — Ambulatory Visit: Payer: Medicare Other

## 2019-08-09 ENCOUNTER — Ambulatory Visit (INDEPENDENT_AMBULATORY_CARE_PROVIDER_SITE_OTHER): Payer: Medicare Other | Admitting: Pulmonary Disease

## 2019-08-09 ENCOUNTER — Encounter: Payer: Self-pay | Admitting: Pulmonary Disease

## 2019-08-09 DIAGNOSIS — J471 Bronchiectasis with (acute) exacerbation: Secondary | ICD-10-CM | POA: Diagnosis not present

## 2019-08-09 MED ORDER — PREDNISONE 10 MG PO TABS: ORAL_TABLET | ORAL | 0 refills | Status: DC

## 2019-08-09 NOTE — Patient Instructions (Signed)
Chest x-ray tomorrow-she will come in between 9 and 12n Based on this we will decide about need for further antibiotics  Prednisone 10 mg tabs  Take 2 tabs daily with food x 5ds, then 1 tab daily with food x 5ds then STOP

## 2019-08-09 NOTE — Telephone Encounter (Signed)
Called and spoke to patient. Patient stated that she is not feeling better while on antibiotic, mucinex, and albuterol. Patient stated she is also using her flutter. Patient shared that she is having increased shortness of breath, non-productive cough, lightheadedness, and overall weakness. Patient stated she would like to be scheduled with Dr. Elsworth Soho. Patient has MyChart but has not done a video visit before. Talked with patient about how to do MyChart video visit and that staff could help her at the time of visit if there are issues as well.  Scheduled patient for video visit. Nothing further is needed at this time.

## 2019-08-09 NOTE — Progress Notes (Signed)
I connected with  Natoya Forrest Correa on 08/09/19 by phone and verified that I am speaking with the correct person using two identifiers.   I discussed the limitations of evaluation and management by telemedicine. The patient expressed understanding and agreed to proceed.  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  On follow-up televisit 12/11 symptoms are persistent and she was given another extended course for 7 days of doxycycline with Mucinex Sputum was initially green, then rubbery and now is mostly dry cough. She has been using albuterol MDI and using her flutter valve. She still complains of residual chest tightness and shortness of breath with intermittent wheezing she has some pain radiating to her back. Oxygen saturation has fluctuated from 92 to 98%.  She had Covid testing on 12/1 which was negative    Significant tests/ events reviewed  01/2008 UGI series showed Achalasia-like appearance of the distal esophagus, s/o stricture of the distal esophagus  12/2013 Spirometry showed no evidence of airway obstruction normal lung function   CT 06/2014 Complete resolution of multifocal peribronchovascular micronodularity noted on the prior study. The small 3 mm left lower lobe nodule noted on the prior examination has completely resolved. The larger nodule in the right lung much smaller    Total encounter time x 63m

## 2019-08-09 NOTE — Assessment & Plan Note (Signed)
Chest x-ray tomorrow-she will come in between 9 and 12n Based on this we will decide about need for further antibiotics  Prednisone 10 mg tabs  Take 2 tabs daily with food x 5ds, then 1 tab daily with food x 5ds then STOP  If no improvement then we can consider nebulizer with nebs/saline

## 2019-08-10 ENCOUNTER — Ambulatory Visit (INDEPENDENT_AMBULATORY_CARE_PROVIDER_SITE_OTHER): Payer: Medicare Other

## 2019-08-10 DIAGNOSIS — J471 Bronchiectasis with (acute) exacerbation: Secondary | ICD-10-CM

## 2019-08-11 ENCOUNTER — Telehealth: Payer: Self-pay | Admitting: Pulmonary Disease

## 2019-08-11 NOTE — Telephone Encounter (Signed)
Spoke with the pt and notified of recs per Dr Elsworth Soho  She verbalized understanding  She wanted to go ahead and schedule appt for Monday and will cancel if she is all better  Appt scheduled

## 2019-08-11 NOTE — Telephone Encounter (Signed)
She would need office visit for oxygen saturation check before deciding about oxygen. Since she is feeling better would hold off and see how she improves.  If she does not, then arrange for office visit with APP by Monday

## 2019-08-11 NOTE — Telephone Encounter (Signed)
Rigoberto Noel, MD  Nena Polio, CMA  No pneumonia   Spoke with patient. She verbalized understanding of results.   While on the phone, she wanted me to know RA know the prednisone has helped her. States the prednisone has caused her to have a productive cough so she is able to cough up the phlegm, which she could not do before. Phlegm ranges from clear to yellow to green. She also has been sleeping better at night.   She wanted to know what RA would think about her having O2 on hand. She is still having problems with SOB. She wants to become active again but she has to limit herself because she is gasping for air (She had to stop several times on the phone to catch her breath). I advised her that I would send a message to RA but if her SOB increases to go to the nearest UC or ED. She verbalized understanding.   RA, please advise.

## 2019-08-13 ENCOUNTER — Other Ambulatory Visit: Payer: Self-pay | Admitting: Internal Medicine

## 2019-08-13 DIAGNOSIS — M545 Low back pain, unspecified: Secondary | ICD-10-CM

## 2019-08-16 ENCOUNTER — Encounter: Payer: Self-pay | Admitting: Primary Care

## 2019-08-16 ENCOUNTER — Ambulatory Visit: Payer: Medicare Other | Admitting: Primary Care

## 2019-08-16 ENCOUNTER — Other Ambulatory Visit: Payer: Self-pay

## 2019-08-16 DIAGNOSIS — J471 Bronchiectasis with (acute) exacerbation: Secondary | ICD-10-CM | POA: Diagnosis not present

## 2019-08-16 MED ORDER — PREDNISONE 10 MG PO TABS: ORAL_TABLET | ORAL | 0 refills | Status: DC

## 2019-08-16 NOTE — Progress Notes (Signed)
@Patient  ID: Tiffany [name], female    DOB: [birth year], 69 y.o.   MRN: [id number]  Chief Complaint  Patient presents with  . Follow-up    Patient is here to see if she needs O2. Patient has Bronchiectasis    Referring provider: Prince Solian, MD  HPI: 68 year old of female, never smoked. PMH significant for bronchiectasis and OSA on CPAP. Patient of Dr. Elsworth Soho. PRN albuterol hfa and flutter valve.   Previous [facility] pulmonary encounters: 07/26/2019 Patient contacted today for acute televisit. Developed shortness of breath and fatigue on Saturday 07/24/19. Associated cough with deep green mucus, chest discomfort with breathing. Not using her flutter valve as consistently as she should be. States that she went to [place] last week, saw her son and grandchildren. No know sick contact, recent travel or prolonged immobilization. Denies fever, sinus congestion, wheezing .   08/06/2019  Patient contacted today for follow-up televisit for bronchiectasis exacerbation. She states that she was feeling better initially after completing doxycycline course but symptoms returned 2 days ago.  Feels her breathing and energy level are a lot better. She continues to cough up thick green/rubbery mucus. She is taking double strength mucinex twice daily as recommended. Denies fever or pleuritic pain.   08/09/19 Televisit with Dr. Elsworth Soho. Reports residual chest tightness and shortness of breath with intermittent wheezing and pain radiating to back. O2 saturation fluctuated from 92-98%. Covid test negative on December 1st. Plan CXR, prednisone 20mg  x 5 days and then 10mg  x 5 days.   08/16/2019 Patient presents today for follow-up visit and qualifying O2 walk. CXR on 12/14 showed no evidence of pneumonia, chronic bronchial thickening and pulmonary scarring. Reports prednisone course has helped. States that her cough has improved, she is getting up less mucus which is light green. She continues to have some  dyspnea and chest tightness. She is using her flutter device and has back off mucinex. Requiring her albuterol rescue inhaler less. Denies fever, chills, sweats.    Significant tests/ events reviewed  01/2008 UGI series showed Achalasia-like appearance of the distal esophagus, s/o stricture of the distal esophagus  12/2013 Spirometry showed no evidence of airway obstruction normal lung function  CT 06/2014 Complete resolution of multifocal peribronchovascular micronodularity noted on the prior study. The small 3 mm left lower lobe nodule noted on the prior examination has completely resolved. The larger nodule in the right lung much smaller  Allergies  Allergen Reactions  . Ambien [Zolpidem Tartrate] Other (See Comments)    Nightmares and insomnia  . Aspirin Other (See Comments)    GI bleed  . Benicar [Olmesartan Medoxomil] Shortness Of Breath, Swelling and Other (See Comments)    Swelling of throat  . Codeine Nausea And Vomiting  . Erythromycin Nausea And Vomiting  . Ketorolac Anaphylaxis  . Ketorolac Tromethamine Anaphylaxis  . Latex Shortness Of Breath and Itching  . Lisinopril Shortness Of Breath, Swelling and Other (See Comments)     Swelling of throat  . Olmesartan Other (See Comments), Shortness Of Breath and Swelling    Swelling of throat  . Other Anaphylaxis    Peaches and mangoes, tree nuts, tomatoes, shellfish, Cat dandor, green peas  . Reclast [Zoledronic Acid] Other (See Comments)    Ocular reaction, toxic  . Toradol [Ketorolac Tromethamine] Anaphylaxis  . Zolpidem Other (See Comments)    Nightmares and insomnia  . Fenofibrate Nausea And Vomiting  . Glucosamine Diarrhea and Nausea Only  . Glucosamine Forte [Nutritional Supplements] Diarrhea and Nausea  Only  . Levaquin [Levofloxacin In D5w] Diarrhea and Swelling  . Levofloxacin Diarrhea and Swelling  . Percocet [Oxycodone-Acetaminophen] Nausea And Vomiting  . Tramadol Nausea And Vomiting  . Lomotil  [Diphenoxylate-Atropine] Other (See Comments)    Hallucinations   . Tizanidine Nausea And Vomiting    Immunization History  Administered Date(s) Administered  . Fluad Quad(high Dose 65+) 05/13/2019  . Influenza,inj,Quad PF,6+ Mos 07/05/2014, 05/14/2016  . Influenza-Unspecified 05/15/2015  . Pneumococcal Conjugate-13 01/04/2015  . Pneumococcal Polysaccharide-23 11/11/2013    Past Medical History:  Diagnosis Date  . Allergy   . Anemia    pernicious anemia- recieved iron infusions until 10-2015  . Anginal pain ([place])   . Anxiety   . Arthritis    foot drop  lt foot  / toes   wears brace     . Bronchiectasis ([place])   . Cancer ([place])    SKIN    . Cataract    REMOVED  . Chronic cough   . CKD (chronic kidney disease)   . Colon polyps   . Coronary artery calcification seen on CT scan 12/22/2017   CT in 2016  . Depression   . Diabetes mellitus   . Dysphagia   . Dysrhythmia    RAPID HEART BEAT , CHEST TIGHTNESS  09/29/14     . Gastritis   . GERD (gastroesophageal reflux disease)   . Hiatal hernia   . History of echocardiogram    Echo 5/19: Mild focal basal septal hypertrophy, EF 60-65, normal wall motion, grade 1 diastolic dysfunction, mildly dilated ascending aorta (38 mm), normal RVSF, mild TR, PASP 23  . Hyperlipidemia   . Hypertension   . IBS (irritable bowel syndrome)   . Insomnia   . Kidney stones    KIDNEY DISEASE   STAGE lll    . Kidney stones   . Macular disorder    MACULAR DISEASE  RT EYE   . Meningitis   . Osteoporosis   . Pneumonia    BRONCHIECTASIS  . Shortness of breath dyspnea    WITH EXERTION  . Sleep apnea    uses cpap  . Spina bifida occulta   . Ulcer     Tobacco History: Social History   Tobacco Use  Smoking Status Never Smoker  Smokeless Tobacco Never Used   Counseling given: Not Answered   Outpatient Medications Prior to Visit  Medication Sig Dispense Refill  . acetaminophen (TYLENOL) 500 MG tablet Take 1,000 mg by mouth every 8 (eight)  hours as needed for mild pain or headache.     . albuterol (VENTOLIN HFA) 108 (90 Base) MCG/ACT inhaler INL 2 PFS PO Q 4 H PRF WHZ OR SOB    . amLODipine (NORVASC) 5 MG tablet Take 1 tablet (5 mg total) by mouth daily. 90 tablet 3  . atorvastatin (LIPITOR) 40 MG tablet Take 1 tablet (40 mg total) by mouth daily. 90 tablet 3  . calcium carbonate (CALCIUM 600) 600 MG TABS tablet Take 600 mg by mouth daily with breakfast.    . Cholecalciferol (VITAMIN D PO) Take 5,000 Units by mouth daily.     . diazepam (VALIUM) 10 MG tablet Take 10 mg by mouth at bedtime as needed for anxiety or sleep.     . diphenhydrAMINE (BENADRYL) 25 mg capsule Take 25 mg by mouth every 6 (six) hours as needed for allergies.    . DULoxetine (CYMBALTA) 30 MG capsule Take 30 mg by mouth daily.   6  .  EPIPEN 2-PAK 0.3 MG/0.3ML SOAJ injection Inject 0.3 mg into the muscle once.   0  . furosemide (LASIX) 40 MG tablet Take 40 mg by mouth daily as needed for fluid or edema.     . hydrALAZINE (APRESOLINE) 25 MG tablet Take 1 tablet (25 mg total) by mouth 3 (three) times daily. 270 tablet 3  . loperamide (IMODIUM) 2 MG capsule Take 2 mg by mouth as needed for diarrhea or loose stools.    Marland Kitchen loratadine (CLARITIN) 10 MG tablet Take 10 mg by mouth daily as needed for allergies.     . metFORMIN (GLUCOPHAGE-XR) 500 MG 24 hr tablet TK 1 T PO BID    . NITROSTAT 0.4 MG SL tablet Place 0.4 mg under the tongue every 5 (five) minutes as needed for chest pain.   3  . Polyethyl Glycol-Propyl Glycol (SYSTANE ULTRA) 0.4-0.3 % SOLN Place 2 drops into both eyes 2 (two) times daily.     . predniSONE (DELTASONE) 10 MG tablet Take 2 tabs daily with food x 5 days, then 1 tab daily with food x 5 days then STOP 15 tablet 0  . promethazine (PHENERGAN) 25 MG tablet Take 25 mg by mouth every 6 (six) hours as needed for nausea or vomiting.     . colestipol (COLESTID) 1 g tablet Take 2 tablets (2 g total) by mouth 2 (two) times daily. (Patient not taking: Reported  on 08/16/2019) 120 tablet 5  . metoprolol succinate (TOPROL-XL) 50 MG 24 hr tablet Take 1 tablet (50 mg total) by mouth 2 (two) times daily. Take with or immediately following a meal. 180 tablet 3  . doxycycline (VIBRA-TABS) 100 MG tablet Take 1 tablet (100 mg total) by mouth 2 (two) times daily. 14 tablet 0   No facility-administered medications prior to visit.   Review of Systems  Review of Systems  Constitutional: Positive for fatigue.  Respiratory: Positive for cough, chest tightness, shortness of breath and wheezing.   Cardiovascular: Negative.    Physical Exam  BP 124/62 (BP Location: Right Arm, Patient Position: Sitting, Cuff Size: Normal)   Pulse 78   Temp (!) 97 F (36.1 C) (Temporal)   Ht 5\' 3"  (1.6 m)   Wt 182 lb 12.8 oz (82.9 kg)   SpO2 99% Comment: on RA  BMI 32.38 kg/m  Physical Exam Constitutional:      Appearance: Normal appearance.  HENT:     Head: Normocephalic and atraumatic.     Mouth/Throat:     Mouth: Mucous membranes are moist.     Pharynx: Oropharynx is clear.  Cardiovascular:     Rate and Rhythm: Normal rate and regular rhythm.  Pulmonary:     Effort: Pulmonary effort is normal.     Breath sounds: Normal breath sounds. No wheezing.  Musculoskeletal:        General: Normal range of motion.  Skin:    General: Skin is warm and dry.  Neurological:     General: No focal deficit present.     Mental Status: She is alert and oriented to person, place, and time. Mental status is at baseline.  Psychiatric:        Mood and Affect: Mood normal.        Behavior: Behavior normal.        Thought Content: Thought content normal.        Judgment: Judgment normal.      Lab Results:  CBC    Component Value Date/Time   WBC  7.2 05/01/2017 1200   RBC 3.82 (L) 05/01/2017 1200   HGB 11.9 (L) 05/01/2017 1200   HCT 35.2 (L) 05/01/2017 1200   PLT 193.0 05/01/2017 1200   MCV 92.4 05/01/2017 1200   MCH 30.6 08/19/2016 1820   MCHC 33.7 05/01/2017 1200   RDW  13.3 05/01/2017 1200   LYMPHSABS 2.0 05/01/2017 1200   MONOABS 0.5 05/01/2017 1200   EOSABS 0.2 05/01/2017 1200   BASOSABS 0.1 05/01/2017 1200    BMET    Component Value Date/Time   NA 144 07/26/2019 1444   K 4.7 07/26/2019 1444   CL 103 07/26/2019 1444   CO2 26 07/26/2019 1444   GLUCOSE 99 07/26/2019 1444   GLUCOSE 124 (H) 05/01/2017 1200   BUN 19 07/26/2019 1444   CREATININE 1.36 (H) 07/26/2019 1444   CALCIUM 9.6 07/26/2019 1444   GFRNONAA 40 (L) 07/26/2019 1444   GFRAA 46 (L) 07/26/2019 1444    BNP No results found for: BNP  ProBNP    Component Value Date/Time   PROBNP 22.1 11/09/2013 1434    Imaging: DG Chest 2 View  Result Date: 08/10/2019 CLINICAL DATA:  Bronchiectasis.  Acute exacerbation. EXAM: CHEST - 2 VIEW COMPARISON:  04/24/2017 FINDINGS: Heart size is normal. Mediastinal shadows are unchanged an unremarkable. As seen previously, there is some bronchial thickening in there is linear scarring in the mid and lower lungs left more than right. No sign of active infiltrate, mass, collapse or effusion. Chronic degenerative changes of the thoracolumbar spine with kyphotic curvature. IMPRESSION: No change.  Chronic bronchial thickening and pulmonary scarring. Electronically Signed   By: Nelson Chimes M.D.   On: 08/10/2019 11:07     Assessment & Plan:   Bronchiectasis with acute exacerbation ([place]) - Slowly improving, reports residual shortness of breath and chest tightness - Extend prednisone taper for additional 5 days - Continue Albuterol hfa 2 puffs every 4-6 hours for breakthrough shortness of breath/wheezing - Continue mucinex twice daily as needed for chest congestion  - Continue flutter valve daily  - Checking sputum culture  - Needs PFTs, consider low dose ICS/LABA if appropriate at that time  - FU in 6-8 weeks with Dr. Elsworth Soho or APP   Martyn Ehrich, NP 08/16/2019

## 2019-08-16 NOTE — Patient Instructions (Addendum)
Recommendations: Continue Cpap Extend prednisone 10mg  x 5 additional days Continue albuterol 2 puffs every 4-6 hours for shortness of breath/wheezing Continue mucinex twice daily as needed for chest congestion Continue flutter valve daily   Orders: Sputum culture  PFTs re: shortness of breath  Follow-up: Needs PFTs with follow-up in 6-8 weeks with Dr. Elsworth Soho in office

## 2019-08-16 NOTE — Assessment & Plan Note (Signed)
-   Slowly improving, reports residual shortness of breath and chest tightness - Extend prednisone taper for additional 5 days - Continue Albuterol hfa 2 puffs every 4-6 hours for breakthrough shortness of breath/wheezing - Continue mucinex twice daily as needed for chest congestion  - Continue flutter valve daily  - Checking sputum culture  - Needs PFTs, consider low dose ICS/LABA if appropriate at that time  - FU in 6-8 weeks with Dr. Elsworth Soho or APP

## 2019-09-24 ENCOUNTER — Ambulatory Visit: Payer: Medicare Other

## 2019-10-02 ENCOUNTER — Ambulatory Visit: Payer: Medicare Other | Attending: Internal Medicine

## 2019-10-02 DIAGNOSIS — Z23 Encounter for immunization: Secondary | ICD-10-CM | POA: Insufficient documentation

## 2019-10-02 NOTE — Progress Notes (Signed)
   Covid-19 Vaccination Clinic  Name:  Tiffany [name]    MRN: [id number] DOB: 1950-09-04  10/02/2019  [name] was observed post Covid-19 immunization for 30 minutes based on pre-vaccination screening without incidence. She was provided with Vaccine Information Sheet and instruction to access the V-Safe system.   [name] was instructed to call 911 with any severe reactions post vaccine: Marland Kitchen Difficulty breathing  . Swelling of your face and throat  . A fast heartbeat  . A bad rash all over your body  . Dizziness and weakness    Immunizations Administered    Name Date Dose VIS Date Route   Pfizer COVID-19 Vaccine 10/02/2019  4:21 PM 0.3 mL 08/06/2019 Intramuscular   Manufacturer: [place]   Lot: [id number]   [place]: [id number]

## 2019-10-05 ENCOUNTER — Ambulatory Visit: Payer: Medicare Other

## 2019-10-06 ENCOUNTER — Ambulatory Visit: Payer: Medicare Other

## 2019-10-11 ENCOUNTER — Telehealth: Payer: Self-pay | Admitting: Primary Care

## 2019-10-11 NOTE — Telephone Encounter (Signed)
Spoke with pt, requesting to pick up sputum cups tomorrow from office.  I have placed these up front for pickup.  Nothing further needed at this time- will close encounter.

## 2019-10-15 ENCOUNTER — Other Ambulatory Visit (HOSPITAL_COMMUNITY)
Admission: RE | Admit: 2019-10-15 | Discharge: 2019-10-15 | Disposition: A | Discharge status: Home / Self Care | Payer: Medicare Other | Source: Ambulatory Visit | Attending: Pulmonary Disease | Admitting: Pulmonary Disease

## 2019-10-15 DIAGNOSIS — Z01812 Encounter for preprocedural laboratory examination: Secondary | ICD-10-CM | POA: Diagnosis present

## 2019-10-15 DIAGNOSIS — Z20822 Contact with and (suspected) exposure to covid-19: Secondary | ICD-10-CM | POA: Diagnosis not present

## 2019-10-15 LAB — SARS CORONAVIRUS 2 (TAT 6-24 HRS): SARS Coronavirus 2: NEGATIVE

## 2019-10-18 ENCOUNTER — Encounter: Payer: Self-pay | Admitting: Pulmonary Disease

## 2019-10-18 ENCOUNTER — Ambulatory Visit: Payer: Medicare Other | Admitting: Pulmonary Disease

## 2019-10-18 ENCOUNTER — Other Ambulatory Visit: Payer: Self-pay

## 2019-10-18 ENCOUNTER — Other Ambulatory Visit: Payer: Self-pay | Admitting: Pulmonary Disease

## 2019-10-18 ENCOUNTER — Ambulatory Visit (INDEPENDENT_AMBULATORY_CARE_PROVIDER_SITE_OTHER): Payer: Medicare Other | Admitting: Pulmonary Disease

## 2019-10-18 DIAGNOSIS — J471 Bronchiectasis with (acute) exacerbation: Secondary | ICD-10-CM

## 2019-10-18 DIAGNOSIS — G4733 Obstructive sleep apnea (adult) (pediatric): Secondary | ICD-10-CM | POA: Diagnosis not present

## 2019-10-18 DIAGNOSIS — Z9989 Dependence on other enabling machines and devices: Secondary | ICD-10-CM | POA: Diagnosis not present

## 2019-10-18 LAB — PULMONARY FUNCTION TEST
DL/VA % pred: 95 %
DL/VA: 3.99 ml/min/mmHg/L
DLCO unc % pred: 81 %
DLCO unc: 16.13 ml/min/mmHg
FEF 25-75 Post: 2.82 L/sec
FEF 25-75 Pre: 2.71 L/sec
FEF2575-%Change-Post: 4 %
FEF2575-%Pred-Post: 138 %
FEF2575-%Pred-Pre: 132 %
FEV1-%Change-Post: 0 %
FEV1-%Pred-Post: 95 %
FEV1-%Pred-Pre: 95 %
FEV1-Post: 2.26 L
FEV1-Pre: 2.25 L
FEV1FVC-%Change-Post: 5 %
FEV1FVC-%Pred-Pre: 109 %
FEV6-%Change-Post: -5 %
FEV6-%Pred-Post: 85 %
FEV6-%Pred-Pre: 90 %
FEV6-Post: 2.54 L
FEV6-Pre: 2.7 L
FEV6FVC-%Change-Post: 0 %
FEV6FVC-%Pred-Post: 104 %
FEV6FVC-%Pred-Pre: 104 %
FVC-%Change-Post: -5 %
FVC-%Pred-Post: 82 %
FVC-%Pred-Pre: 87 %
FVC-Post: 2.55 L
FVC-Pre: 2.7 L
Post FEV1/FVC ratio: 88 %
Post FEV6/FVC ratio: 100 %
Pre FEV1/FVC ratio: 84 %
Pre FEV6/FVC Ratio: 100 %
RV % pred: 74 %
RV: 1.58 L
TLC % pred: 90 %
TLC: 4.55 L

## 2019-10-18 NOTE — Progress Notes (Signed)
pFT done today.

## 2019-10-18 NOTE — Patient Instructions (Signed)
Good luck with the second Covid shot.  Lung function is normal  Use albuterol  as needed only for wheezing or shortness of breath

## 2019-10-18 NOTE — Assessment & Plan Note (Signed)
We discussed her CPAP mask problem, she gets hot and sweaty especially over her face We reviewed different types of masks, she does have a minimalist mask.  I recommended a small table fan to place next to her CPAP  Weight loss encouraged, compliance with goal of at least 4-6 hrs every night is the expectation. Advised against medications with sedative side effects Cautioned against driving when sleepy - understanding that sleepiness will vary on a day to day basis

## 2019-10-18 NOTE — Progress Notes (Signed)
   Subjective:    Patient ID: Tiffany [name], female    DOB: [birth year], 69 y.o.   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  Finally improved. PFTs reviewed today.  GERD symptoms are under good control. She has questions about her CPAP mask -CPAP is being managed by Dr. Brett Fairy    Significant tests/ events reviewed  09/2019 PFTs-normal lung function, no airway obstruction, DLCO 81%   01/2008 UGI series showed Achalasia-like appearance of the distal esophagus, s/o stricture of the distal esophagus  12/2013 Spirometry showed no evidence of airway obstruction normal lung function   CT 06/2014 Complete resolution of multifocal peribronchovascular micronodularity noted on the prior study. The small 3 mm left lower lobe nodule noted on the prior examination has completely resolved. The larger nodule in the right lung much smaller   Review of Systems Patient denies significant dyspnea,cough, hemoptysis,  chest pain, palpitations, pedal edema, orthopnea, paroxysmal nocturnal dyspnea, lightheadedness, nausea, vomiting, abdominal or  leg pains      Objective:   Physical Exam  Gen. Pleasant, well-nourished, in no distress ENT - no thrush, no pallor/icterus,no post nasal drip Neck: No JVD, no thyromegaly, no carotid bruits Lungs: no use of accessory muscles, no dullness to percussion, clear without rales or rhonchi    Cardiovascular: Rhythm regular, heart sounds  normal, no murmurs or gallops, no peripheral edema Musculoskeletal: No deformities, no cyanosis or clubbing        Assessment & Plan:

## 2019-10-18 NOTE — Progress Notes (Unsigned)
ppft

## 2019-10-18 NOTE — Assessment & Plan Note (Signed)
Her acute flare has subsided Lung function appears normal so she really does not need bronchodilators except on an as-needed basis GERD symptoms are well controlled

## 2019-10-19 LAB — RESPIRATORY CULTURE OR RESPIRATORY AND SPUTUM CULTURE: MICRO NUMBER:: 10173310

## 2019-10-21 ENCOUNTER — Other Ambulatory Visit: Payer: Self-pay

## 2019-10-21 MED ORDER — HYDRALAZINE HCL 25 MG PO TABS: 25.0000 mg | ORAL_TABLET | Freq: Three times a day (TID) | ORAL | 2 refills | Status: DC

## 2019-10-27 ENCOUNTER — Ambulatory Visit: Payer: Medicare Other | Attending: Internal Medicine

## 2019-10-27 DIAGNOSIS — Z23 Encounter for immunization: Secondary | ICD-10-CM | POA: Insufficient documentation

## 2019-10-27 NOTE — Progress Notes (Signed)
   Covid-19 Vaccination Clinic  Name:  Tiffany [name]    MRN: [id number] DOB: 06-Oct-1950  10/27/2019  Tiffany [name] was observed post Covid-19 immunization for 30 minutes based on pre-vaccination screening without incident. She was provided with Vaccine Information Sheet and instruction to access the V-Safe system.   Tiffany [name] was instructed to call 911 with any severe reactions post vaccine: Marland Kitchen Difficulty breathing  . Swelling of face and throat  . A fast heartbeat  . A bad rash all over body  . Dizziness and weakness   Immunizations Administered    Name Date Dose VIS Date Route   Pfizer COVID-19 Vaccine 10/27/2019 12:31 PM 0.3 mL 08/06/2019 Intramuscular   Manufacturer: [place]   Lot: [id number]   [place]: [id number]

## 2020-01-17 ENCOUNTER — Telehealth: Payer: Self-pay | Admitting: Internal Medicine

## 2020-01-17 MED ORDER — COLESTIPOL HCL 1 G PO TABS: 2.0000 g | ORAL_TABLET | Freq: Two times a day (BID) | ORAL | 5 refills | Status: DC

## 2020-01-17 NOTE — Telephone Encounter (Signed)
Patient called requesting to get Colestid again because she was not able to get it the first time due to cost. She now has [place] that can help with that

## 2020-01-17 NOTE — Telephone Encounter (Signed)
Sent Colestid to pharmacy

## 2020-02-11 IMAGING — DX DG CHEST 2V
2 series · 2 of 2 positions shown · non-contrast
Comparison: 04/24/2017

CLINICAL DATA: Bronchiectasis.  Acute exacerbation.

EXAM:
CHEST - 2 VIEW

[chest pa]
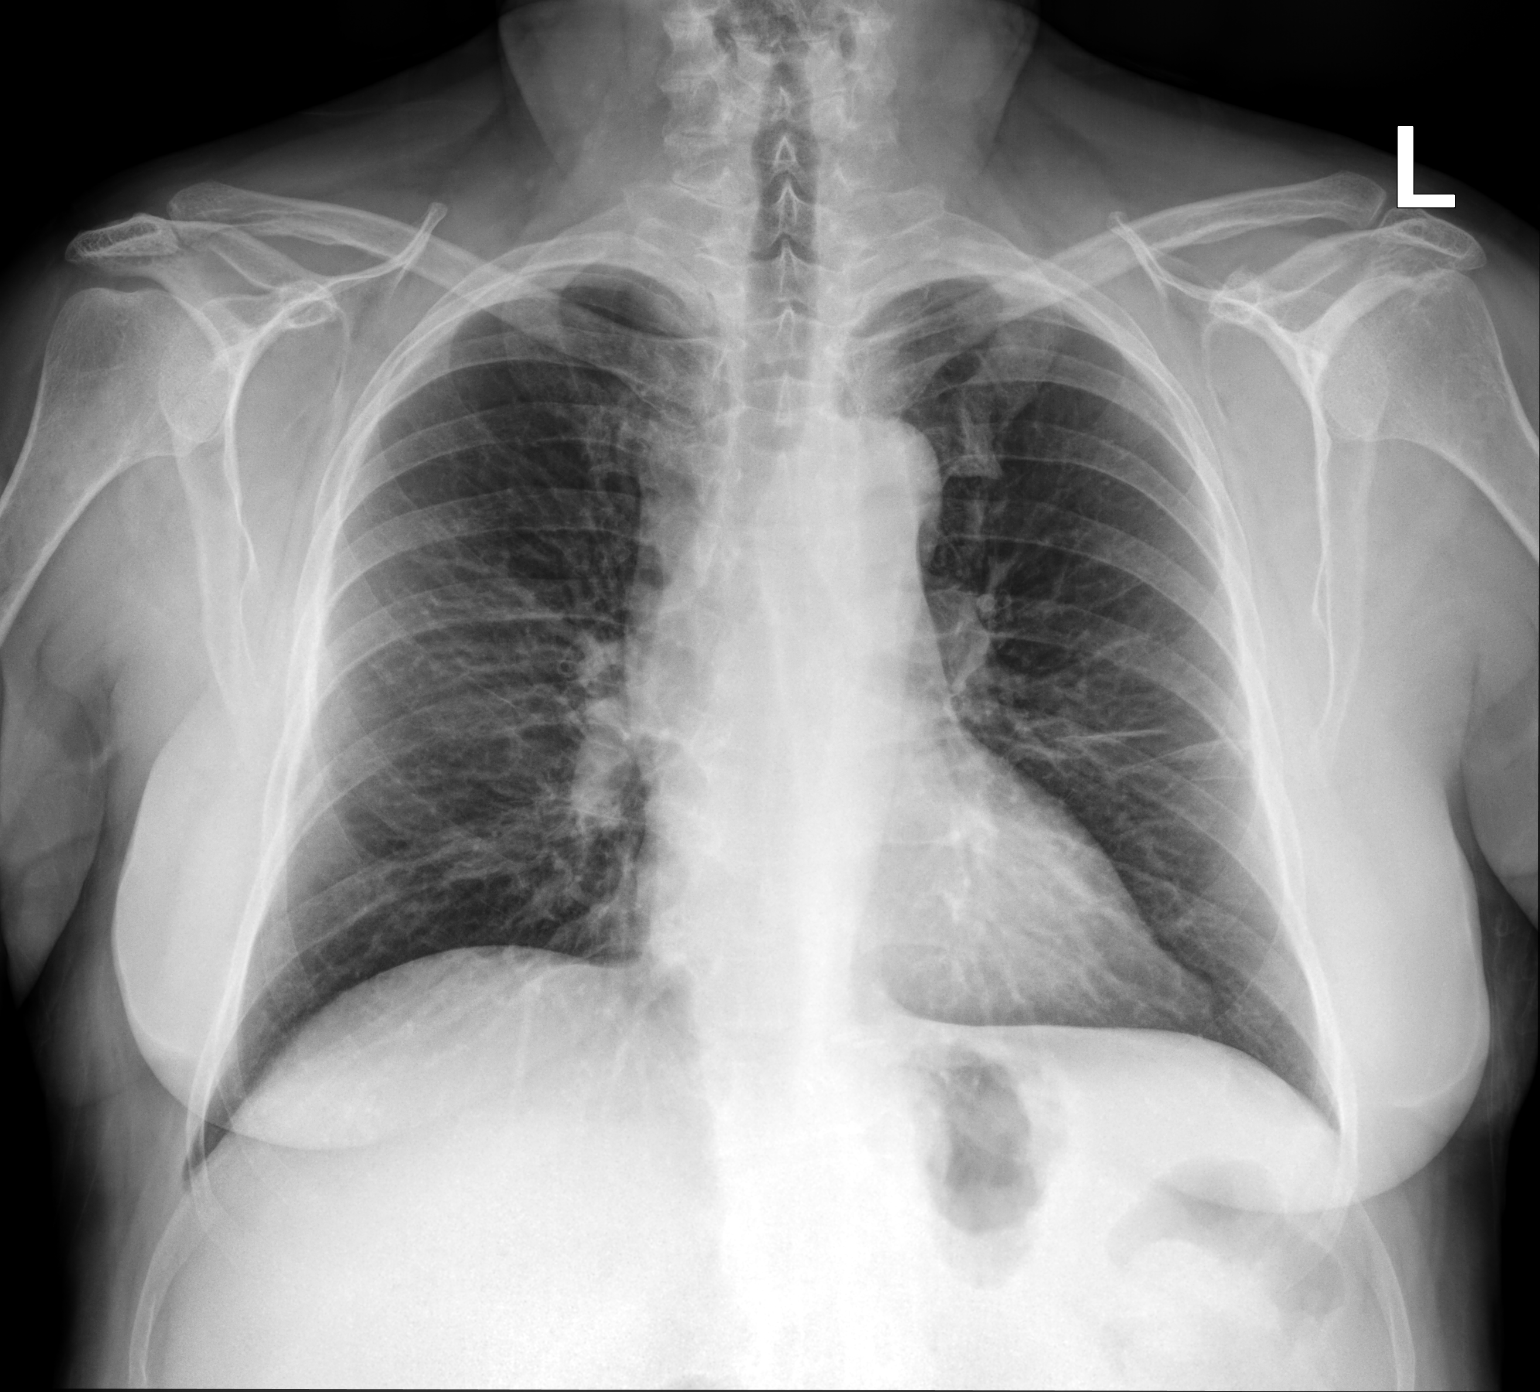

[chest lat]
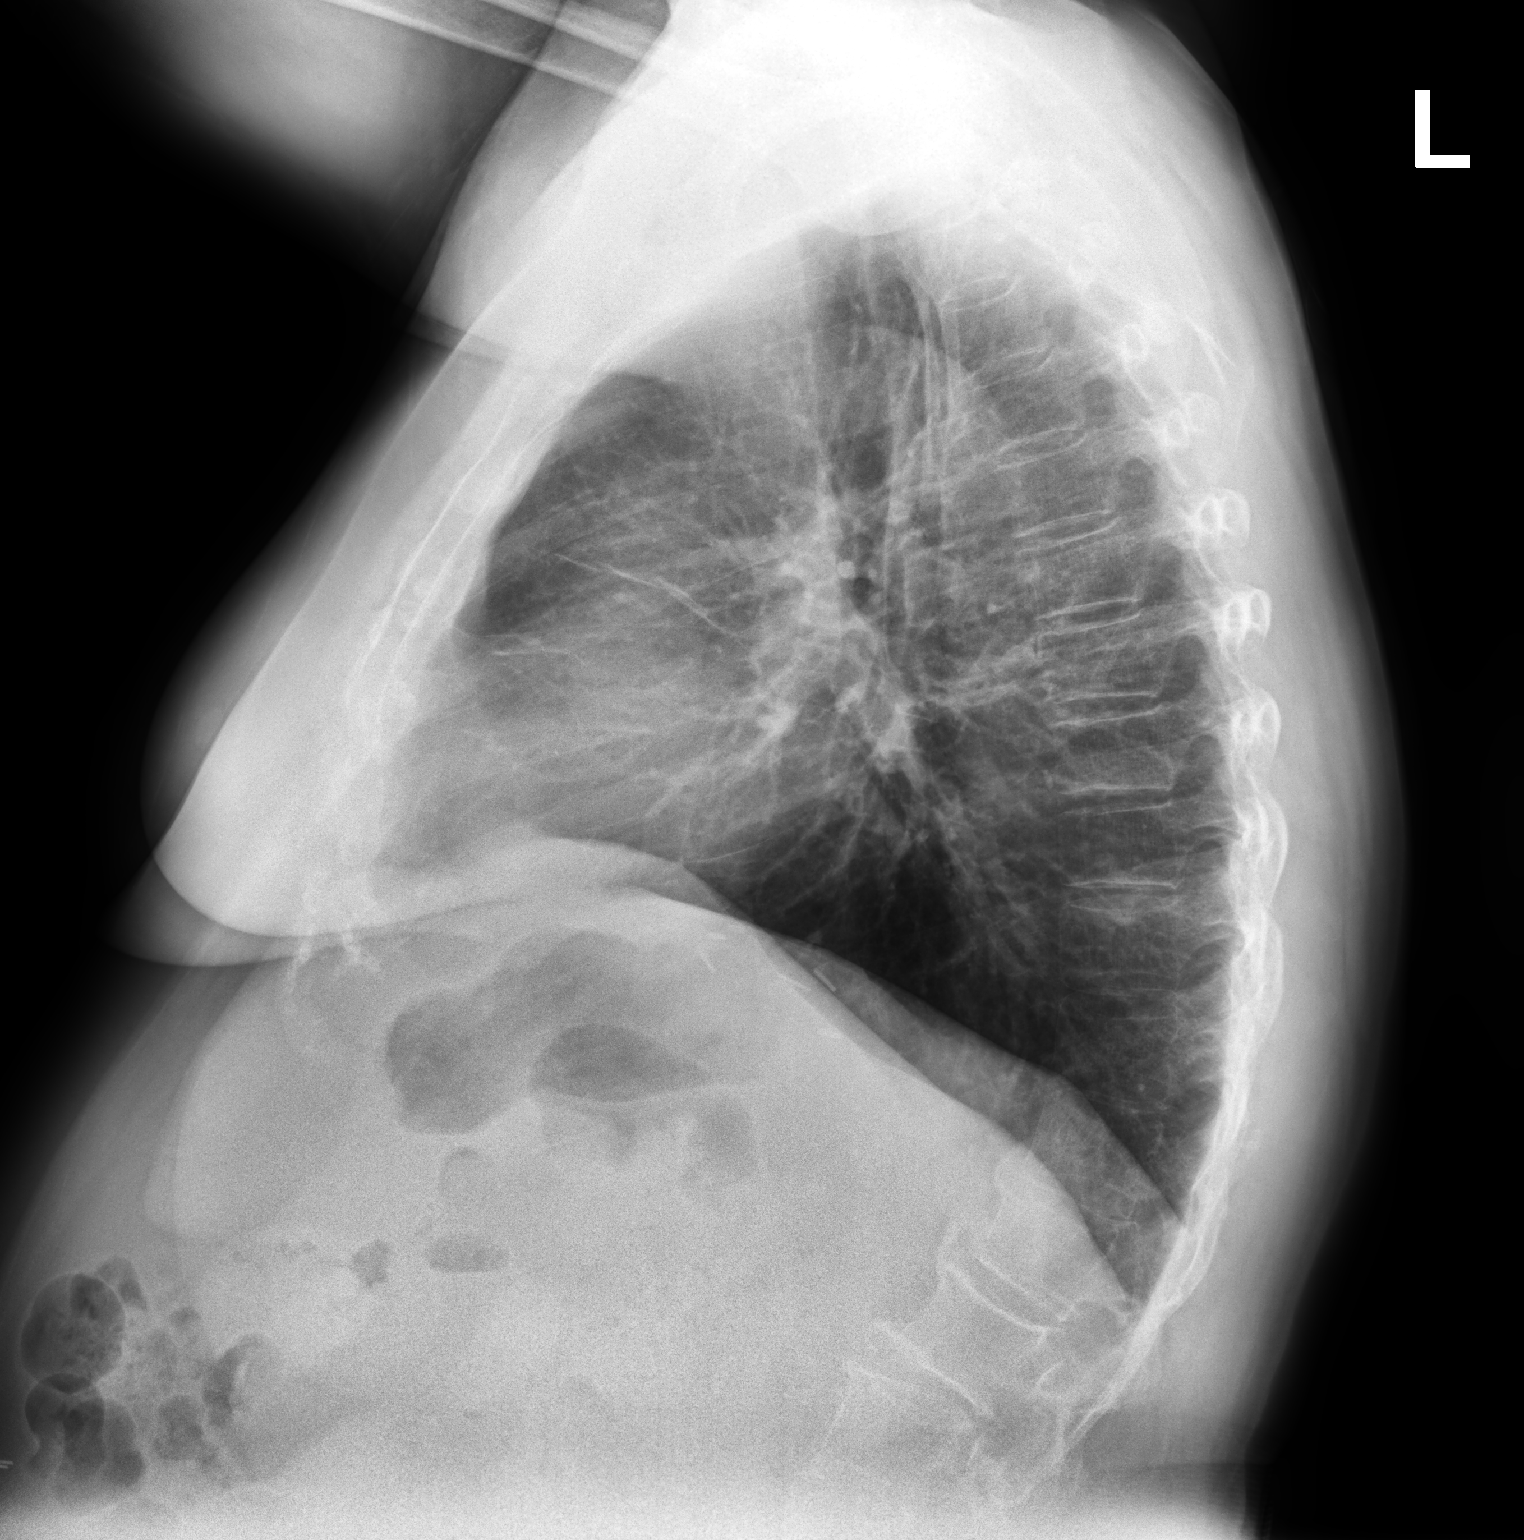

[2 of 2 positions shown; findings below may reference images not displayed]

FINDINGS: Heart size is normal. Mediastinal shadows are unchanged an
unremarkable. As seen previously, there is some bronchial thickening
in there is linear scarring in the mid and lower lungs left more
than right. No sign of active infiltrate, mass, collapse or
effusion. Chronic degenerative changes of the thoracolumbar spine
with kyphotic curvature.
IMPRESSION: No change.  Chronic bronchial thickening and pulmonary scarring.

## 2020-03-03 ENCOUNTER — Encounter: Payer: Self-pay | Admitting: Internal Medicine

## 2020-03-03 ENCOUNTER — Other Ambulatory Visit: Payer: Self-pay

## 2020-03-03 ENCOUNTER — Ambulatory Visit: Payer: Medicare Other | Admitting: Internal Medicine

## 2020-03-03 VITALS — BP 142/72 | HR 62 | Ht 64.0 in | Wt 180.2 lb

## 2020-03-03 DIAGNOSIS — E782 Mixed hyperlipidemia: Secondary | ICD-10-CM | POA: Diagnosis not present

## 2020-03-03 DIAGNOSIS — I10 Essential (primary) hypertension: Secondary | ICD-10-CM | POA: Diagnosis not present

## 2020-03-03 DIAGNOSIS — I2510 Atherosclerotic heart disease of native coronary artery without angina pectoris: Secondary | ICD-10-CM

## 2020-03-03 DIAGNOSIS — R002 Palpitations: Secondary | ICD-10-CM | POA: Diagnosis not present

## 2020-03-03 MED ORDER — HYDRALAZINE HCL 50 MG PO TABS: 50.0000 mg | ORAL_TABLET | Freq: Three times a day (TID) | ORAL | 3 refills | Status: DC

## 2020-03-03 NOTE — Patient Instructions (Addendum)
Medication Instructions:  Your physician has recommended you make the following change in your medication:  INCREASE Hydralazine (apresoline) to 50 mg 3 times per day  *If you need a refill on your cardiac medications before your next appointment, please call your pharmacy*   Lab Work: None Ordered If you have labs (blood work) drawn today and your tests are completely normal, you will receive your results only by: Marland Kitchen MyChart Message (if you have MyChart) OR . A paper copy in the mail If you have any lab test that is abnormal or we need to change your treatment, we will call you to review the results.   Testing/Procedures: None Ordered   Follow-Up: At [facility], you and your health needs are our priority.  As part of our continuing mission to provide you with exceptional heart care, we have created designated Provider Care Teams.  These Care Teams include your primary Cardiologist (physician) and Advanced Practice Providers (APPs -  Physician Assistants and Nurse Practitioners) who all work together to provide you with the care you need, when you need it.   Your next appointment:   9 month(s)  The format for your next appointment:   In Person  Provider:   You may see Dorris Carnes, MD or one of the following Advanced Practice Providers on your designated Care Team:    Richardson Dopp, PA-C  [place], [place]

## 2020-03-03 NOTE — Progress Notes (Signed)
Cardiology Office Note   Date:  03/04/2020   ID:  Tiffany [name], DOB 15-Apr-1951, MRN [id number]  PCP:  Tiffany Solian, MD  Cardiologist:   Tiffany Carnes, MD   Pt presents for f/u pf palpitations    History of Present Illness: Tiffany [name] is a 69 y.o. female with a history of bronchiectaisis, HTN, spina bifida, achalasia, palpitations and CP  In 2016  Myovue was norma   I saw the pt in clinic in December 2019  At that time she had ane episode of heart racing  HR up to  140  Lasted about 30 min  Laid down and it went away  Prior to spell she had felt good    At taht visit I recomm increasing her Toprol XL to BID  I saw the pt in October 2020  Since seen she has done OK   She notes rare papitations.    Breathing is OK    SHe is alos seen inrenal clinic     BP up some  Discussion of increasing hydralazine made at last visit  Outpatient Medications Prior to Visit  Medication Sig Dispense Refill  . acetaminophen (TYLENOL) 500 MG tablet Take 1,000 mg by mouth every 8 (eight) hours as needed for mild pain or headache.     . albuterol (VENTOLIN HFA) 108 (90 Base) MCG/ACT inhaler INL 2 PFS PO Q 4 H PRF WHZ OR SOB    . amLODipine (NORVASC) 5 MG tablet Take 1 tablet (5 mg total) by mouth daily. 90 tablet 3  . atorvastatin (LIPITOR) 40 MG tablet Take 1 tablet (40 mg total) by mouth daily. 90 tablet 3  . calcium carbonate (CALCIUM 600) 600 MG TABS tablet Take 600 mg by mouth daily with breakfast.    . Cholecalciferol (VITAMIN D PO) Take 5,000 Units by mouth daily.     . diazepam (VALIUM) 10 MG tablet Take 10 mg by mouth at bedtime as needed for anxiety or sleep.     . diphenhydrAMINE (BENADRYL) 25 mg capsule Take 25 mg by mouth every 6 (six) hours as needed for allergies.    . DULoxetine (CYMBALTA) 30 MG capsule Take 60 mg by mouth daily.   6  . EPIPEN 2-PAK 0.3 MG/0.3ML SOAJ injection Inject 0.3 mg into the muscle once.   0  . furosemide (LASIX) 40 MG tablet Take 20 mg by  mouth daily as needed for fluid or edema.     Marland Kitchen loperamide (IMODIUM) 2 MG capsule Take 2 mg by mouth as needed for diarrhea or loose stools.    Marland Kitchen loratadine (CLARITIN) 10 MG tablet Take 10 mg by mouth daily as needed for allergies.     . metFORMIN (GLUCOPHAGE-XR) 500 MG 24 hr tablet TK 1 T PO BID    . metoprolol succinate (TOPROL-XL) 50 MG 24 hr tablet Take 1 tablet (50 mg total) by mouth 2 (two) times daily. Take with or immediately following a meal. 180 tablet 3  . NITROSTAT 0.4 MG SL tablet Place 0.4 mg under the tongue every 5 (five) minutes as needed for chest pain.   3  . Polyethyl Glycol-Propyl Glycol (SYSTANE ULTRA) 0.4-0.3 % SOLN Place 2 drops into both eyes 2 (two) times daily.     . promethazine (PHENERGAN) 25 MG tablet Take 25 mg by mouth every 6 (six) hours as needed for nausea or vomiting.     . hydrALAZINE (APRESOLINE) 25 MG tablet Take 1 tablet (25 mg total) by  mouth 3 (three) times daily. 270 tablet 2  . colestipol (COLESTID) 1 g tablet Take 2 tablets (2 g total) by mouth 2 (two) times daily. (Patient not taking: Reported on 03/03/2020) 120 tablet 5   No facility-administered medications prior to visit.     Allergies:   Ambien [zolpidem tartrate], Aspirin, Benicar [olmesartan medoxomil], Codeine, Erythromycin, Ketorolac, Ketorolac tromethamine, Latex, Lisinopril, Olmesartan, Other, Reclast [zoledronic acid], Toradol [ketorolac tromethamine], Zolpidem, Fenofibrate, Glucosamine, Glucosamine forte [nutritional supplements], Levaquin [levofloxacin in d5w], Levofloxacin, Percocet [oxycodone-acetaminophen], Tramadol, Lomotil [diphenoxylate-atropine], and Tizanidine   Past Medical History:  Diagnosis Date  . Allergy   . Anemia    pernicious anemia- recieved iron infusions until 10-2015  . Anginal pain ([place])   . Anxiety   . Arthritis    foot drop  lt foot  / toes   wears brace     . Bronchiectasis ([place])   . Cancer ([place])    SKIN    . Cataract    REMOVED  . Chronic cough   . CKD  (chronic kidney disease)   . Colon polyps   . Coronary artery calcification seen on CT scan 12/22/2017   CT in 2016  . Depression   . Diabetes mellitus   . Dysphagia   . Dysrhythmia    RAPID HEART BEAT , CHEST TIGHTNESS  09/29/14     . Gastritis   . GERD (gastroesophageal reflux disease)   . Hiatal hernia   . History of echocardiogram    Echo 5/19: Mild focal basal septal hypertrophy, EF 60-65, normal wall motion, grade 1 diastolic dysfunction, mildly dilated ascending aorta (38 mm), normal RVSF, mild TR, PASP 23  . Hyperlipidemia   . Hypertension   . IBS (irritable bowel syndrome)   . Insomnia   . Kidney stones    KIDNEY DISEASE   STAGE lll    . Kidney stones   . Macular disorder    MACULAR DISEASE  RT EYE   . Meningitis   . Osteoporosis   . Pneumonia    BRONCHIECTASIS  . Shortness of breath dyspnea    WITH EXERTION  . Sleep apnea    uses cpap  . Spina bifida occulta   . Ulcer     Past Surgical History:  Procedure Laterality Date  . ABDOMINAL HYSTERECTOMY    . BACK SURGERY    . CATARACT EXTRACTION  03/2012   bilateral  . COLONOSCOPY    . eardrum     left   ruptured and repaired  . ELBOW SURGERY     left  . fundoplication    . KNEE ARTHROSCOPY     rt knee   . LEFT HEART CATH AND CORONARY ANGIOGRAPHY N/A 12/25/2017   Procedure: LEFT HEART CATH AND CORONARY ANGIOGRAPHY;  Surgeon: Nelva Bush, MD;  Location: [facility];  Service: Cardiovascular;  Laterality: N/A;  . LITHOTRIPSY    . MENISCUS REPAIR    . POLYPECTOMY    . TONSILLECTOMY    . TOTAL KNEE ARTHROPLASTY Right 10/11/2014   Procedure: RIGHT TOTAL KNEE ARTHROPLASTY;  Surgeon: Mcarthur Rossetti, MD;  Location: [facility];  Service: Orthopedics;  Laterality: Right;     Social History:  The patient  reports that she has never smoked. She has never used smokeless tobacco. She reports that she does not drink alcohol and does not use drugs.   Family History:  The patient's family history includes  Alzheimer's disease in her father; Cancer in her mother; Colon cancer  in her maternal grandmother and mother; Coronary artery disease in an other family member; Diabetes in her mother; Esophageal cancer in her cousin and cousin; Gout in her father; Heart attack (age of onset: 28) in her maternal grandmother; Heart attack (age of onset: 48) in her maternal grandfather; Hypertension in her mother; Irritable bowel syndrome in her father; Nephrolithiasis in her brother and mother; Ovarian cancer in her mother; Thyroid cancer in her sister; Ulcers in her father.    ROS:  Please see the history of present illness. All other systems are reviewed and  Negative to the above problem except as noted.    PHYSICAL EXAM: VS:  BP (!) 142/72   Pulse 62   Ht 5\' 4"  (1.626 m)   Wt 180 lb 3.2 oz (81.7 kg)   SpO2 97%   BMI 30.93 kg/m   GEN:  Obese 69 yo female in NAD HEENT: normal  Neck: no JVD, carotid bruits Cardiac: RRR; no murmurs, rubs, or gallops  No signif edema ankles and feet   Respiratory:  clear to auscultation bilaterally, normal work of breathing GI: soft, nontender, nondistended, + BS  No hepatomegaly  MS: no deformity Moving all extremities    Neuro:  Strength and sensation are intact Psych: euthymic mood, full affect   EKG:  EKG is ordered   SR 61 bpm     Lipid Panel    Component Value Date/Time   CHOL 165 02/29/2016 0819   TRIG 279 (H) 02/29/2016 0819   HDL 42 (L) 02/29/2016 0819   CHOLHDL 3.9 02/29/2016 0819   VLDL 56 (H) 02/29/2016 0819   LDLCALC 67 02/29/2016 0819   LDLDIRECT 94.0 05/05/2015 1038      Wt Readings from Last 3 Encounters:  03/03/20 180 lb 3.2 oz (81.7 kg)  10/18/19 184 lb (83.5 kg)  08/16/19 182 lb 12.8 oz (82.9 kg)      ASSESSMENT AND PLAN:  1  Palpitations  Rare  Follow     2  HTN   Increase hydralazine to 50 tid   Continue other meds    3  HL   Keep on lipitor    4 CAD   No symptoms of angina  5   Renal   Follow with [facility]   F/U  clinic in 9 months       Signed, Tiffany Carnes, MD  03/04/2020 9:50 PM    [address], [place], [place]  [address] Phone: [phone]; Fax: [phone]

## 2020-03-13 ENCOUNTER — Other Ambulatory Visit: Payer: Self-pay | Admitting: Internal Medicine

## 2020-03-13 DIAGNOSIS — Z1231 Encounter for screening mammogram for malignant neoplasm of breast: Secondary | ICD-10-CM

## 2020-03-30 ENCOUNTER — Other Ambulatory Visit: Payer: Self-pay

## 2020-03-30 ENCOUNTER — Ambulatory Visit
Admission: RE | Admit: 2020-03-30 | Discharge: 2020-03-30 | Disposition: A | Discharge status: Home / Self Care | Payer: Medicare Other | Source: Ambulatory Visit | Attending: Internal Medicine | Admitting: Internal Medicine

## 2020-03-30 DIAGNOSIS — Z1231 Encounter for screening mammogram for malignant neoplasm of breast: Secondary | ICD-10-CM

## 2020-04-24 ENCOUNTER — Ambulatory Visit: Payer: Medicare Other | Attending: Internal Medicine

## 2020-04-24 DIAGNOSIS — Z23 Encounter for immunization: Secondary | ICD-10-CM

## 2020-04-24 NOTE — Progress Notes (Signed)
° °  Covid-19 Vaccination Clinic  Name:  Tiffany [name]    MRN: [id number] DOB: 11-Apr-1951  04/24/2020  [name] was observed post Covid-19 immunization for 15 minutes without incident. She was provided with Vaccine Information Sheet and instruction to access the V-Safe system.   [name] was instructed to call 911 with any severe reactions post vaccine:  Difficulty breathing   Swelling of face and throat   A fast heartbeat   A bad rash all over body   Dizziness and weakness

## 2020-10-18 ENCOUNTER — Telehealth: Payer: Self-pay

## 2020-10-18 NOTE — Telephone Encounter (Signed)
Advised patient to bring SD from CPAP machine for visit tomorrow. Nothing further needed.

## 2020-10-19 ENCOUNTER — Telehealth: Payer: Self-pay

## 2020-10-19 ENCOUNTER — Encounter: Payer: Self-pay | Admitting: Adult Health

## 2020-10-19 ENCOUNTER — Other Ambulatory Visit: Payer: Self-pay

## 2020-10-19 ENCOUNTER — Ambulatory Visit: Payer: Medicare Other | Admitting: Adult Health

## 2020-10-19 VITALS — BP 120/78 | HR 71 | Temp 97.5°F | Ht 63.0 in | Wt 179.0 lb

## 2020-10-19 DIAGNOSIS — G4733 Obstructive sleep apnea (adult) (pediatric): Secondary | ICD-10-CM | POA: Diagnosis not present

## 2020-10-19 DIAGNOSIS — Z9989 Dependence on other enabling machines and devices: Secondary | ICD-10-CM

## 2020-10-19 DIAGNOSIS — J479 Bronchiectasis, uncomplicated: Secondary | ICD-10-CM

## 2020-10-19 MED ORDER — ALBUTEROL SULFATE HFA 108 (90 BASE) MCG/ACT IN AERS: 1.0000 | INHALATION_SPRAY | Freq: Four times a day (QID) | RESPIRATORY_TRACT | 2 refills | Status: AC | PRN

## 2020-10-19 NOTE — Progress Notes (Signed)
@Patient  ID: Tiffany [name], female    DOB: 10-31-1950, 70 y.o.   MRN: [id number]  Chief Complaint  Patient presents with  . 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 The small 3 mm left lower lobe nodule noted on the prior examination has completely resolved. The larger nodule in the right lung much smaller  Chest x-ray December 2020 showed stable changes with chronic bronchial thickening and pulmonary scarring  10/19/2020 Follow up: Bronchiectasis and obstructive sleep apnea Patient returns for a 1 year follow-up.  Patient has underlying bronchiectasis.  Says overall her breathing is doing well with no increased cough /congestion  Bronchitis in 07/2020 . Was able to resolve with Mucinex and Flutter Valve. Did not require antibiotics or steroids. No chest pain or orthopnea.  Remains active . Does housework, shopping, light yard work. Drives and lives at home with husband-Steve.    Patient has underlying obstructive sleep apnea.  Has difficulty wearing her CPAP machine.  Says she has not worn it in several months.  She is followed for sleep by her neurologist. Says just has trouble wearing it . , tolerating mask.  Wants [place] to manage OSA , will need to get records .    Allergies  Allergen Reactions  . Ambien [Zolpidem Tartrate]  Other (See Comments)    Nightmares and insomnia  . Aspirin Other (See Comments)    GI bleed  . Benicar [Olmesartan Medoxomil] Shortness Of Breath, Swelling and Other (See Comments)    Swelling of throat  . Codeine Nausea And Vomiting  . Erythromycin Nausea And Vomiting  . Ketorolac Anaphylaxis  . Ketorolac Tromethamine Anaphylaxis  . Latex Shortness Of Breath and Itching  . Lisinopril Shortness Of Breath, Swelling and Other (See Comments)     Swelling of throat  . Olmesartan Other (See Comments), Shortness Of Breath and Swelling    Swelling of throat  . Other Anaphylaxis    Peaches and mangoes, tree nuts, tomatoes, shellfish, Cat dandor, green peas  . Reclast [Zoledronic Acid] Other (See Comments)    Ocular reaction, toxic  . Toradol [Ketorolac Tromethamine] Anaphylaxis  . Zolpidem Other (See Comments)    Nightmares and insomnia  . Fenofibrate Nausea And Vomiting  . Glucosamine Diarrhea and Nausea Only  . Glucosamine Forte [Nutritional Supplements] Diarrhea and Nausea Only  . Levaquin [Levofloxacin In D5w] Diarrhea and Swelling  . Levofloxacin Diarrhea and Swelling  . Percocet [Oxycodone-Acetaminophen] Nausea And Vomiting  . Tramadol Nausea And Vomiting  . Lomotil [Diphenoxylate-Atropine] Other (See Comments)    Hallucinations   . Tizanidine Nausea And Vomiting    Immunization History  Administered Date(s) Administered  . Fluad Quad(high Dose 65+) 05/13/2019  . Influenza, Quadrivalent, Recombinant, Inj, Pf 07/07/2018, 06/15/2020  . Influenza,inj,Quad PF,6+ Mos 07/05/2014, 05/14/2016  . Influenza-Unspecified 05/15/2015  . PFIZER(Purple Top)SARS-COV-2 Vaccination 10/02/2019, 10/27/2019, 04/24/2020  . Pneumococcal Conjugate-13 01/04/2015  . Pneumococcal Polysaccharide-23 11/11/2013    Past Medical History:  Diagnosis Date  .  Allergy   . Anemia    pernicious anemia- recieved iron infusions until 10-2015  . Anginal pain ([place])   . Anxiety   . Arthritis    foot drop  lt  foot  / toes   wears brace     . Bronchiectasis ([place])   . Cancer ([place])    SKIN    . Cataract    REMOVED  . Chronic cough   . CKD (chronic kidney disease)   . Colon polyps   . Coronary artery calcification seen on CT scan 12/22/2017   CT in 2016  . Depression   . Diabetes mellitus   . Dysphagia   . Dysrhythmia    RAPID HEART BEAT , CHEST TIGHTNESS  09/29/14     . Gastritis   . GERD (gastroesophageal reflux disease)   . Hiatal hernia   . History of echocardiogram    Echo 5/19: Mild focal basal septal hypertrophy, EF 60-65, normal wall motion, grade 1 diastolic dysfunction, mildly dilated ascending aorta (38 mm), normal RVSF, mild TR, PASP 23  . Hyperlipidemia   . Hypertension   . IBS (irritable bowel syndrome)   . Insomnia   . Kidney stones    KIDNEY DISEASE   STAGE lll    . Kidney stones   . Macular disorder    MACULAR DISEASE  RT EYE   . Meningitis   . Osteoporosis   . Pneumonia    BRONCHIECTASIS  . Shortness of breath dyspnea    WITH EXERTION  . Sleep apnea    uses cpap  . Spina bifida occulta   . Ulcer     Tobacco History: Social History   Tobacco Use  Smoking Status Never Smoker  Smokeless Tobacco Never Used   Counseling given: Not Answered   Outpatient Medications Prior to Visit  Medication Sig Dispense Refill  . acetaminophen (TYLENOL) 500 MG tablet Take 1,000 mg by mouth every 8 (eight) hours as needed for mild pain or headache.     . albuterol (VENTOLIN HFA) 108 (90 Base) MCG/ACT inhaler INL 2 PFS PO Q 4 H PRF WHZ OR SOB    . amLODipine (NORVASC) 5 MG tablet Take 1 tablet (5 mg total) by mouth daily. 90 tablet 3  . atorvastatin (LIPITOR) 40 MG tablet Take 1 tablet (40 mg total) by mouth daily. 90 tablet 3  . calcium carbonate (OS-CAL) 600 MG TABS tablet Take 600 mg by mouth daily with breakfast.    . Cholecalciferol (VITAMIN D PO) Take 5,000 Units by mouth daily.     . diazepam (VALIUM) 10 MG tablet Take 10 mg by mouth at bedtime as needed for anxiety  or sleep.     . diphenhydrAMINE (BENADRYL) 25 mg capsule Take 25 mg by mouth every 6 (six) hours as needed for allergies.    . DULoxetine (CYMBALTA) 30 MG capsule Take 60 mg by mouth daily.   6  . EPIPEN 2-PAK 0.3 MG/0.3ML SOAJ injection Inject 0.3 mg into the muscle once.   0  . furosemide (LASIX) 40 MG tablet Take 20 mg by mouth daily as needed for fluid or edema.     . hydrALAZINE (APRESOLINE) 50 MG tablet Take 1 tablet (50 mg total) by mouth 3 (three) times daily. 270 tablet 3  . loperamide (IMODIUM) 2 MG capsule Take 2 mg by mouth as needed for diarrhea or loose stools.    Marland Kitchen loratadine (CLARITIN) 10 MG tablet Take 10 mg by mouth daily as needed  for allergies.     . metFORMIN (GLUCOPHAGE-XR) 500 MG 24 hr tablet TK 1 T PO BID    . metoprolol succinate (TOPROL-XL) 50 MG 24 hr tablet Take 1 tablet (50 mg total) by mouth 2 (two) times daily. Take with or immediately following a meal. (Patient taking differently: Take 75 mg by mouth in the morning, at noon, and at bedtime. Take with or immediately following a meal.) 180 tablet 3  . NITROSTAT 0.4 MG SL tablet Place 0.4 mg under the tongue every 5 (five) minutes as needed for chest pain.   3  . Polyethyl Glycol-Propyl Glycol 0.4-0.3 % SOLN Place 2 drops into both eyes 2 (two) times daily.     . promethazine (PHENERGAN) 25 MG tablet Take 25 mg by mouth every 6 (six) hours as needed for nausea or vomiting.      No facility-administered medications prior to visit.     Review of Systems:   Constitutional:   No  weight loss, night sweats,  Fevers, chills, fatigue, or  lassitude.  HEENT:   No headaches,  Difficulty swallowing,  Tooth/dental problems, or  Sore throat,                No sneezing, itching, ear ache, nasal congestion, post nasal drip,   CV:  No chest pain,  Orthopnea, PND, swelling in lower extremities, anasarca, dizziness, palpitations, syncope.   GI  No heartburn, indigestion, abdominal pain, nausea, vomiting, diarrhea, change in  bowel habits, loss of appetite, bloody stools.   Resp: No shortness of breath with exertion or at rest.  No excess mucus, no productive cough,  No non-productive cough,  No coughing up of blood.  No change in color of mucus.  No wheezing.  No chest wall deformity  Skin: no rash or lesions.  GU: no dysuria, change in color of urine, no urgency or frequency.  No flank pain, no hematuria   MS:  No joint pain or swelling.  No decreased range of motion.  No back pain.    Physical Exam  BP 120/78 (BP Location: Left Arm, Cuff Size: Normal)   Pulse 71   Temp (!) 97.5 F (36.4 C)   Ht 5\' 3"  (1.6 m)   Wt 179 lb (81.2 kg)   SpO2 98%   BMI 31.71 kg/m   GEN: A/Ox3; pleasant , NAD, well nourished    HEENT:  [place]/AT,  EACs-clear, TMs-wnl, NOSE-clear, THROAT-clear, no lesions, no postnasal drip or exudate noted.   NECK:  Supple w/ fair ROM; no JVD; normal carotid impulses w/o bruits; no thyromegaly or nodules palpated; no lymphadenopathy.    RESP  Clear  P & A; w/o, wheezes/ rales/ or rhonchi. no accessory muscle use, no dullness to percussion  CARD:  RRR, no m/r/g, no peripheral edema, pulses intact, no cyanosis or clubbing.  GI:   Soft & nt; nml bowel sounds; no organomegaly or masses detected.   Musco: Warm bil, no deformities or joint swelling noted.   Neuro: alert, no focal deficits noted.    Skin: Warm, no lesions or rashes    Lab Results:  CBC    Component Value Date/Time   WBC 7.2 05/01/2017 1200   RBC 3.82 (L) 05/01/2017 1200   HGB 11.9 (L) 05/01/2017 1200   HCT 35.2 (L) 05/01/2017 1200   PLT 193.0 05/01/2017 1200   MCV 92.4 05/01/2017 1200   MCH 30.6 08/19/2016 1820   MCHC 33.7 05/01/2017 1200   RDW 13.3 05/01/2017 1200  LYMPHSABS 2.0 05/01/2017 1200   MONOABS 0.5 05/01/2017 1200   EOSABS 0.2 05/01/2017 1200   BASOSABS 0.1 05/01/2017 1200    BMET    Component Value Date/Time   NA 144 07/26/2019 1444   K 4.7 07/26/2019 1444   CL 103 07/26/2019 1444   CO2  26 07/26/2019 1444   GLUCOSE 99 07/26/2019 1444   GLUCOSE 124 (H) 05/01/2017 1200   BUN 19 07/26/2019 1444   CREATININE 1.36 (H) 07/26/2019 1444   CALCIUM 9.6 07/26/2019 1444   GFRNONAA 40 (L) 07/26/2019 1444   GFRAA 46 (L) 07/26/2019 1444    BNP No results found for: BNP  ProBNP    Component Value Date/Time   PROBNP 22.1 11/09/2013 1434    Imaging: No results found.    PFT Results Latest Ref Rng & Units 10/18/2019  FVC-Pre L 2.70  FVC-Predicted Pre % 87  FVC-Post L 2.55  FVC-Predicted Post % 82  Pre FEV1/FVC % % 84  Post FEV1/FCV % % 88  FEV1-Pre L 2.25  FEV1-Predicted Pre % 95  FEV1-Post L 2.26  DLCO uncorrected ml/min/mmHg 16.13  DLCO UNC% % 81  DLVA Predicted % 95  TLC L 4.55  TLC % Predicted % 90  RV % Predicted % 74    No results found for: NITRICOXIDE      Assessment & Plan:   Bronchiectasis without complication (HCC) Appears stable   Plan  Patient Instructions  Restart CPAP At bedtime   Need SD CPAP card .  Wear all night long .  Try Dream wear Nasal mask -send order to [facility]  CPAP download in 6 weeks  Do not drive if sleepy  Work on healthy weight .   Activity as tolerated  ProAir 1-2 puffs every 6hrs as needed.  Flutter valve As needed  -order for new flutter valve.   Follow up with Dr. Elsworth Soho  In 6 months and As needed         OSA on CPAP Obtain records from Dr. Westley Hummer office  Restart CPAP   Plan  Patient Instructions  Restart CPAP At bedtime   Need SD CPAP card .  Wear all night long .  Try Dream wear Nasal mask -send order to [facility]  CPAP download in 6 weeks  Do not drive if sleepy  Work on healthy weight .   Activity as tolerated  ProAir 1-2 puffs every 6hrs as needed.  Flutter valve As needed  -order for new flutter valve.   Follow up with Dr. Elsworth Soho  In 6 months and As needed       '  Morbid obesity ([place]) Healthy weight loss       Rexene Edison, NP 10/19/2020

## 2020-10-19 NOTE — Assessment & Plan Note (Signed)
Appears stable   Plan  Patient Instructions  Restart CPAP At bedtime   Need SD CPAP card .  Wear all night long .  Try Dream wear Nasal mask -send order to [facility]  CPAP download in 6 weeks  Do not drive if sleepy  Work on healthy weight .   Activity as tolerated  ProAir 1-2 puffs every 6hrs as needed.  Flutter valve As needed  -order for new flutter valve.   Follow up with Dr. Elsworth Soho  In 6 months and As needed

## 2020-10-19 NOTE — Assessment & Plan Note (Signed)
Obtain records from Dr. Westley Hummer office  Restart CPAP   Plan  Patient Instructions  Restart CPAP At bedtime   Need SD CPAP card .  Wear all night long .  Try Dream wear Nasal mask -send order to [facility]  CPAP download in 6 weeks  Do not drive if sleepy  Work on healthy weight .   Activity as tolerated  ProAir 1-2 puffs every 6hrs as needed.  Flutter valve As needed  -order for new flutter valve.   Follow up with Dr. Elsworth Soho  In 6 months and As needed       '

## 2020-10-19 NOTE — Assessment & Plan Note (Signed)
Healthy weight loss 

## 2020-10-19 NOTE — Telephone Encounter (Signed)
Spoke with Medical records at [facility]. They will fax patients sleep study results to the office

## 2020-10-19 NOTE — Telephone Encounter (Signed)
Left message at [facility]. Pt has seen Larey Seat, MD and had a sleep study completed, need office to fax the results to the office.

## 2020-10-19 NOTE — Patient Instructions (Addendum)
Restart CPAP At bedtime   Need SD CPAP card .  Wear all night long .  Try Dream wear Nasal mask -send order to [facility]  CPAP download in 6 weeks  Do not drive if sleepy  Work on healthy weight .   Activity as tolerated  ProAir 1-2 puffs every 6hrs as needed.  Flutter valve As needed  -order for new flutter valve.   Follow up with Dr. Elsworth Soho  In 6 months and As needed

## 2020-10-20 ENCOUNTER — Telehealth: Payer: Self-pay | Admitting: Adult Health

## 2020-10-20 NOTE — Telephone Encounter (Signed)
Called and spoke with pt and addressed the questions that she had about the AVS from her visit yesterday.  She stated that she did order her sd card and she is aware that we will get the DL in about 6 weeks.  She was given the number to [employer] to follow up on the flutter device.   I did let her know that these are on back order and it will take some time for her to get this device.  Nothing further is needed.

## 2020-10-31 ENCOUNTER — Other Ambulatory Visit: Payer: Self-pay | Admitting: Pulmonary Disease

## 2020-10-31 ENCOUNTER — Telehealth: Payer: Self-pay | Admitting: Pulmonary Disease

## 2020-10-31 MED ORDER — DOXYCYCLINE HYCLATE 100 MG PO TABS: 100.0000 mg | ORAL_TABLET | Freq: Two times a day (BID) | ORAL | 0 refills | Status: DC

## 2020-10-31 NOTE — Telephone Encounter (Signed)
Called and spoke with patient. She is aware of the doxycycline. Verbalized understanding.   Nothing further needed at time of call.

## 2020-10-31 NOTE — Progress Notes (Signed)
Doxycycline 100 p.o. twice daily for 7 days sent into pharmacy

## 2020-10-31 NOTE — Telephone Encounter (Signed)
Pt saw TP a few weeks ago, had slight cough was using Mucinex. Pt now has heavier cough, coughing green phlegm,chest feels tight,hoarse. Pt states she usually gets Doxycycline to prevent pna. Pt finishing last dose of Macrobid for UTI. Please advise.[phone]

## 2020-10-31 NOTE — Telephone Encounter (Signed)
I called and spoke with pt and she stated that she was seen by TP on 10/19/2020 and she stated that in the last few days she has started with a cough with green sputum and some chest tightness.  She denies any fever, body aches or chills or sinus congestion.  She stated that she just finished her macrobid today for her UTI.  She is not taking any OTC meds to help with the cough.  TP is not in today.  AO please advise. Thanks  Allergies  Allergen Reactions  . Ambien [Zolpidem Tartrate] Other (See Comments)    Nightmares and insomnia  . Aspirin Other (See Comments)    GI bleed  . Benicar [Olmesartan Medoxomil] Shortness Of Breath, Swelling and Other (See Comments)    Swelling of throat  . Codeine Nausea And Vomiting  . Erythromycin Nausea And Vomiting  . Ketorolac Anaphylaxis  . Ketorolac Tromethamine Anaphylaxis  . Latex Shortness Of Breath and Itching  . Lisinopril Shortness Of Breath, Swelling and Other (See Comments)     Swelling of throat  . Olmesartan Other (See Comments), Shortness Of Breath and Swelling    Swelling of throat  . Other Anaphylaxis    Peaches and mangoes, tree nuts, tomatoes, shellfish, Cat dandor, green peas  . Reclast [Zoledronic Acid] Other (See Comments)    Ocular reaction, toxic  . Toradol [Ketorolac Tromethamine] Anaphylaxis  . Zolpidem Other (See Comments)    Nightmares and insomnia  . Fenofibrate Nausea And Vomiting  . Glucosamine Diarrhea and Nausea Only  . Glucosamine Forte [Nutritional Supplements] Diarrhea and Nausea Only  . Levaquin [Levofloxacin In D5w] Diarrhea and Swelling  . Levofloxacin Diarrhea and Swelling  . Percocet [Oxycodone-Acetaminophen] Nausea And Vomiting  . Tramadol Nausea And Vomiting  . Lomotil [Diphenoxylate-Atropine] Other (See Comments)    Hallucinations   . Tizanidine Nausea And Vomiting

## 2020-10-31 NOTE — Telephone Encounter (Signed)
Will call in a course of doxycycline

## 2020-11-20 ENCOUNTER — Telehealth: Payer: Self-pay | Admitting: Internal Medicine

## 2020-11-20 NOTE — Telephone Encounter (Signed)
Patient sent list of BP readings into Feb Metoprolol had been increased by Dr Arty Baumgartner BP has been running better since increase Keep on same meds

## 2020-11-21 NOTE — Telephone Encounter (Signed)
MyChart message to patient letting her know Dr. Alan Ripper review of her BPs.

## 2020-11-27 ENCOUNTER — Encounter: Payer: Self-pay | Admitting: Internal Medicine

## 2020-11-27 ENCOUNTER — Other Ambulatory Visit: Payer: Self-pay

## 2020-11-27 ENCOUNTER — Ambulatory Visit: Payer: Medicare Other | Admitting: Internal Medicine

## 2020-11-27 VITALS — BP 136/74 | HR 86 | Ht 63.0 in | Wt 176.2 lb

## 2020-11-27 DIAGNOSIS — I10 Essential (primary) hypertension: Secondary | ICD-10-CM | POA: Diagnosis not present

## 2020-11-27 MED ORDER — AMLODIPINE BESYLATE 5 MG PO TABS: 7.5000 mg | ORAL_TABLET | Freq: Every day | ORAL | 3 refills | Status: DC

## 2020-11-27 NOTE — Progress Notes (Addendum)
Cardiology Office Note   Date:  11/27/2020   ID:  Tiffany [name], DOB 1951/05/13, MRN [id number]  PCP:  Prince Solian, MD  Cardiologist:   Dorris Carnes, MD   Pt presents for f/u pf palpitations    History of Present Illness: Tiffany [name] is a 70 y.o. female with a history of bronchiectaisis, HTN, spina bifida, achalasia, palpitations and CP  In 2016  Myovue was normal  She went on and had a L heart cath in 2019 that showed normal coronary arteries   LVEDP 17   Echo done after showed LVEF normal    Milld diastolic dysfunction Rx Lasix prn  SInce she was in clinic last she has done OK   No palpittations   Breathing is stable   No CP   No edema  Does say she feels fatigued  Follows with Dr Dagmar Hait   Going to have B12 injection next week    No dizziness    Walks some   Problems with knees  Diet:  Breakfast:  None    Lunch  None or sandwich / soup  Dinner:   Meat    Cooked veg  ( has a lot of problems with veggies due to GI intolerances; limited in variety   Drink   Water    Snack  Fruit   No nuts  Due to GI symptoms   Outpatient Medications Prior to Visit  Medication Sig Dispense Refill  . acetaminophen (TYLENOL) 500 MG tablet Take 1,000 mg by mouth every 8 (eight) hours as needed for mild pain or headache.     . albuterol (VENTOLIN HFA) 108 (90 Base) MCG/ACT inhaler INL 2 PFS PO Q 4 H PRF WHZ OR SOB    . albuterol (VENTOLIN HFA) 108 (90 Base) MCG/ACT inhaler Inhale 1-2 puffs into the lungs every 6 (six) hours as needed for wheezing or shortness of breath. 8 g 2  . amLODipine (NORVASC) 5 MG tablet Take 1 tablet (5 mg total) by mouth daily. 90 tablet 3  . atorvastatin (LIPITOR) 40 MG tablet Take 1 tablet (40 mg total) by mouth daily. 90 tablet 3  . calcium carbonate (OS-CAL) 600 MG TABS tablet Take 600 mg by mouth 3 (three) times daily with meals.    . Cholecalciferol (VITAMIN D PO) Take 5,000 Units by mouth daily.     . diazepam (VALIUM) 10 MG tablet Take 10 mg by mouth at  bedtime as needed for anxiety or sleep.     . diphenhydrAMINE (BENADRYL) 25 mg capsule Take 25 mg by mouth every 6 (six) hours as needed for allergies.    Marland Kitchen doxycycline (VIBRA-TABS) 100 MG tablet Take 1 tablet (100 mg total) by mouth 2 (two) times daily. 14 tablet 0  . DULoxetine (CYMBALTA) 30 MG capsule Take 60 mg by mouth daily.   6  . EPIPEN 2-PAK 0.3 MG/0.3ML SOAJ injection Inject 0.3 mg into the muscle once.   0  . furosemide (LASIX) 40 MG tablet Take 20 mg by mouth daily as needed for fluid or edema.     . hydrALAZINE (APRESOLINE) 50 MG tablet Take 1 tablet (50 mg total) by mouth 3 (three) times daily. (Patient taking differently: Take 25 mg by mouth 3 (three) times daily.) 270 tablet 3  . loperamide (IMODIUM) 2 MG capsule Take 2 mg by mouth as needed for diarrhea or loose stools.    Marland Kitchen loratadine (CLARITIN) 10 MG tablet Take 10 mg by mouth daily as needed  for allergies.     . metFORMIN (GLUCOPHAGE-XR) 500 MG 24 hr tablet TK 1 T PO BID    . metoprolol succinate (TOPROL-XL) 50 MG 24 hr tablet Take 1 tablet (50 mg total) by mouth 2 (two) times daily. Take with or immediately following a meal. (Patient taking differently: Take 75 mg by mouth in the morning, at noon, and at bedtime. Take with or immediately following a meal.) 180 tablet 3  . NITROSTAT 0.4 MG SL tablet Place 0.4 mg under the tongue every 5 (five) minutes as needed for chest pain.   3  . Polyethyl Glycol-Propyl Glycol 0.4-0.3 % SOLN Place 2 drops into both eyes 2 (two) times daily.     . promethazine (PHENERGAN) 25 MG tablet Take 25 mg by mouth every 6 (six) hours as needed for nausea or vomiting.      No facility-administered medications prior to visit.     Allergies:   Ambien [zolpidem tartrate], Aspirin, Benicar [olmesartan medoxomil], Codeine, Erythromycin, Ketorolac, Ketorolac tromethamine, Latex, Lisinopril, Olmesartan, Other, Reclast [zoledronic acid], Toradol [ketorolac tromethamine], Zolpidem, Fenofibrate, Glucosamine,  Glucosamine forte [nutritional supplements], Levaquin [levofloxacin in d5w], Levofloxacin, Percocet [oxycodone-acetaminophen], Tramadol, Atorvastatin, Lomotil [diphenoxylate-atropine], Metformin hcl, Tizanidine hcl, Venlafaxine, and Tizanidine   Past Medical History:  Diagnosis Date  . Allergy   . Anemia    pernicious anemia- recieved iron infusions until 10-2015  . Anginal pain ([place])   . Anxiety   . Arthritis    foot drop  lt foot  / toes   wears brace     . Bronchiectasis ([place])   . Cancer ([place])    SKIN    . Cataract    REMOVED  . Chronic cough   . CKD (chronic kidney disease)   . Colon polyps   . Coronary artery calcification seen on CT scan 12/22/2017   CT in 2016  . Depression   . Diabetes mellitus   . Dysphagia   . Dysrhythmia    RAPID HEART BEAT , CHEST TIGHTNESS  09/29/14     . Gastritis   . GERD (gastroesophageal reflux disease)   . Hiatal hernia   . History of echocardiogram    Echo 5/19: Mild focal basal septal hypertrophy, EF 60-65, normal wall motion, grade 1 diastolic dysfunction, mildly dilated ascending aorta (38 mm), normal RVSF, mild TR, PASP 23  . Hyperlipidemia   . Hypertension   . IBS (irritable bowel syndrome)   . Insomnia   . Kidney stones    KIDNEY DISEASE   STAGE lll    . Kidney stones   . Macular disorder    MACULAR DISEASE  RT EYE   . Meningitis   . Osteoporosis   . Pneumonia    BRONCHIECTASIS  . Shortness of breath dyspnea    WITH EXERTION  . Sleep apnea    uses cpap  . Spina bifida occulta   . Ulcer     Past Surgical History:  Procedure Laterality Date  . ABDOMINAL HYSTERECTOMY    . BACK SURGERY    . CATARACT EXTRACTION  03/2012   bilateral  . COLONOSCOPY    . eardrum     left   ruptured and repaired  . ELBOW SURGERY     left  . fundoplication    . KNEE ARTHROSCOPY     rt knee   . LEFT HEART CATH AND CORONARY ANGIOGRAPHY N/A 12/25/2017   Procedure: LEFT HEART CATH AND CORONARY ANGIOGRAPHY;  Surgeon: Nelva Bush, MD;   Location:  [facility];  Service: Cardiovascular;  Laterality: N/A;  . LITHOTRIPSY    . MENISCUS REPAIR    . POLYPECTOMY    . TONSILLECTOMY    . TOTAL KNEE ARTHROPLASTY Right 10/11/2014   Procedure: RIGHT TOTAL KNEE ARTHROPLASTY;  Surgeon: Mcarthur Rossetti, MD;  Location: [facility];  Service: Orthopedics;  Laterality: Right;     Social History:  The patient  reports that she has never smoked. She has never used smokeless tobacco. She reports that she does not drink alcohol and does not use drugs.   Family History:  The patient's family history includes Alzheimer's disease in her father; Cancer in her mother; Colon cancer in her maternal grandmother and mother; Coronary artery disease in an other family member; Diabetes in her mother; Esophageal cancer in her cousin and cousin; Gout in her father; Heart attack (age of onset: 44) in her maternal grandmother; Heart attack (age of onset: 65) in her maternal grandfather; Hypertension in her mother; Irritable bowel syndrome in her father; Nephrolithiasis in her brother and mother; Ovarian cancer in her mother; Thyroid cancer in her sister; Ulcers in her father.    ROS:  Please see the history of present illness. All other systems are reviewed and  Negative to the above problem except as noted.    PHYSICAL EXAM: VS:  BP 136/74   Pulse 86   Ht 5\' 3"  (1.6 m)   Wt 176 lb 3.2 oz (79.9 kg)   SpO2 96%   BMI 31.21 kg/m   GEN:  Obese 70 yo female in NAD HEENT: normal  Neck: no JVD, NO carotid bruits Cardiac: RRR; no murmurs,No LE edema Respiratory:  clear to auscultation bilaterally, normal work of breathing GI: soft, nontender, nondistended, + BS  No hepatomegaly  MS: no deformity Moving all extremities    Neuro:  Strength and sensation are intact Psych: euthymic mood, full affect   EKG:  EKG is not ordered   Lipid Panel    Component Value Date/Time   CHOL 165 02/29/2016 0819   TRIG 279 (H) 02/29/2016 0819   HDL 42 (L) 02/29/2016  0819   CHOLHDL 3.9 02/29/2016 0819   VLDL 56 (H) 02/29/2016 0819   LDLCALC 67 02/29/2016 0819   LDLDIRECT 94.0 05/05/2015 1038      Wt Readings from Last 3 Encounters:  11/27/20 176 lb 3.2 oz (79.9 kg)  10/19/20 179 lb (81.2 kg)  03/03/20 180 lb 3.2 oz (81.7 kg)      ASSESSMENT AND PLAN:  1  HTN   Pt has multiple intlolerances    Blood pressure readings at home are 110s to 150   Avg seems to be 130s   Will increase amlodipine to 7.5 mg daily   Follow at home  MyChart response  Of BP to changes in a few wks    2  Palpitations  Patient denies    3 HL   Triglycerides high   We discussed diet choices   She is now taking fish oil   (per R Avva) COntinue lipitor       4 Hx CP  Cath in 2019 showed no CAD  Elevated LVEDP at 17   Watch salt  COntinue furosemide prn      5   Renal   Follow with J Coladonato  6  Fatigue  Will check CBC , BMET, fe, ferr, B12   F/U clinic in 9 months       Signed, Dorris Carnes, MD  11/27/2020 9:22 AM    [address], [place], [place]  [address] Phone: [phone]; Fax: [phone]

## 2020-11-27 NOTE — Patient Instructions (Signed)
Medication Instructions:  Your physician has recommended you make the following change in your medication:   INCREASE Amlodipine to 7.5mg  *If you need a refill on your cardiac medications before your next appointment, please call your pharmacy*   Lab Work: TODAY: CBC, BMET, iron, Ferritin, B12 If you have labs (blood work) drawn today and your tests are completely normal, you will receive your results only by: Marland Kitchen MyChart Message (if you have MyChart) OR . A paper copy in the mail If you have any lab test that is abnormal or we need to change your treatment, we will call you to review the results.   Testing/Procedures: none   Follow-Up: At [facility], you and your health needs are our priority.  As part of our continuing mission to provide you with exceptional heart care, we have created designated Provider Care Teams.  These Care Teams include your primary Cardiologist (physician) and Advanced Practice Providers (APPs -  Physician Assistants and Nurse Practitioners) who all work together to provide you with the care you need, when you need it.   Your next appointment:   9 month(s)  The format for your next appointment:   In Person  Provider:   You may see Dorris Carnes, MD or one of the following Advanced Practice Providers on your designated Care Team:      Other Instructions Begin to monitor BP readings and notify the office

## 2020-11-28 LAB — VITAMIN B12: Vitamin B-12: 364 pg/mL (ref 232–1245)

## 2020-11-28 LAB — BASIC METABOLIC PANEL
BUN/Creatinine Ratio: 16 (ref 12–28)
BUN: 25 mg/dL (ref 8–27)
CO2: 22 mmol/L (ref 20–29)
Calcium: 9.6 mg/dL (ref 8.7–10.3)
Chloride: 105 mmol/L (ref 96–106)
Creatinine, Ser: 1.6 mg/dL — ABNORMAL HIGH (ref 0.57–1.00)
Glucose: 137 mg/dL — ABNORMAL HIGH (ref 65–99)
Potassium: 5.2 mmol/L (ref 3.5–5.2)
Sodium: 144 mmol/L (ref 134–144)
eGFR: 35 mL/min/{1.73_m2} — ABNORMAL LOW (ref >59)

## 2020-11-28 LAB — CBC
Hematocrit: 34.3 % (ref 34.0–46.6)
Hemoglobin: 11.3 g/dL (ref 11.1–15.9)
MCH: 29.9 pg (ref 26.6–33.0)
MCHC: 32.9 g/dL (ref 31.5–35.7)
MCV: 91 fL (ref 79–97)
Platelets: 240 10*3/uL (ref 150–450)
RBC: 3.78 x10E6/uL (ref 3.77–5.28)
RDW: 12.3 % (ref 11.7–15.4)
WBC: 7.3 10*3/uL (ref 3.4–10.8)

## 2020-11-28 LAB — FERRITIN: Ferritin: 84 ng/mL (ref 15–150)

## 2020-11-28 LAB — IRON: Iron: 69 ug/dL (ref 27–139)

## 2020-12-04 MED ORDER — AMLODIPINE BESYLATE 5 MG PO TABS: ORAL_TABLET | ORAL | 3 refills | Status: DC

## 2020-12-04 MED ORDER — AMLODIPINE BESYLATE 2.5 MG PO TABS: ORAL_TABLET | ORAL | 3 refills | Status: DC

## 2020-12-29 ENCOUNTER — Ambulatory Visit: Payer: Medicare Other | Attending: Internal Medicine

## 2020-12-29 ENCOUNTER — Other Ambulatory Visit: Payer: Self-pay

## 2020-12-29 ENCOUNTER — Other Ambulatory Visit (HOSPITAL_BASED_OUTPATIENT_CLINIC_OR_DEPARTMENT_OTHER): Payer: Self-pay

## 2020-12-29 DIAGNOSIS — Z23 Encounter for immunization: Secondary | ICD-10-CM

## 2020-12-29 MED ORDER — PFIZER-BIONT COVID-19 VAC-TRIS 30 MCG/0.3ML IM SUSP
INTRAMUSCULAR | 0 refills | Status: DC
  Filled 2020-12-29: qty 0.3, 1d supply, fill #0

## 2020-12-29 NOTE — Progress Notes (Signed)
   Covid-19 Vaccination Clinic  Name:  Tiffany [name]    MRN: [id number] DOB: 1951/05/20  12/29/2020  [name] was observed post Covid-19 immunization for 15 minutes without incident. She was provided with Vaccine Information Sheet and instruction to access the V-Safe system.   [name] was instructed to call 911 with any severe reactions post vaccine: Marland Kitchen Difficulty breathing  . Swelling of face and throat  . A fast heartbeat  . A bad rash all over body  . Dizziness and weakness   Immunizations Administered    Name Date Dose VIS Date Route   PFIZER Comrnaty(Gray TOP) Covid-19 Vaccine 12/29/2020 11:44 AM 0.3 mL 08/03/2020 Intramuscular   Manufacturer: [place]   Lot: [id number]   NDC: [id number]

## 2021-01-04 ENCOUNTER — Ambulatory Visit: Payer: Medicare Other | Admitting: Internal Medicine

## 2021-01-04 ENCOUNTER — Encounter: Payer: Self-pay | Admitting: Internal Medicine

## 2021-01-04 ENCOUNTER — Other Ambulatory Visit: Payer: Self-pay | Admitting: Internal Medicine

## 2021-01-04 VITALS — BP 120/64 | HR 67 | Ht 63.0 in | Wt 180.4 lb

## 2021-01-04 DIAGNOSIS — R159 Full incontinence of feces: Secondary | ICD-10-CM

## 2021-01-04 DIAGNOSIS — R195 Other fecal abnormalities: Secondary | ICD-10-CM

## 2021-01-04 DIAGNOSIS — Z86010 Personal history of colonic polyps: Secondary | ICD-10-CM

## 2021-01-04 DIAGNOSIS — R197 Diarrhea, unspecified: Secondary | ICD-10-CM

## 2021-01-04 DIAGNOSIS — R1310 Dysphagia, unspecified: Secondary | ICD-10-CM

## 2021-01-04 MED ORDER — PLENVU 140 G PO SOLR: 1.0000 | Freq: Once | ORAL | 0 refills | Status: AC

## 2021-01-04 NOTE — Patient Instructions (Signed)
If you are age 69 or older, your body mass index should be between 23-30. Your Body mass index is 31.96 kg/m. If this is out of the aforementioned range listed, please consider follow up with your Primary Care Provider.  If you are age 70 or younger, your body mass index should be between 19-25. Your Body mass index is 31.96 kg/m. If this is out of the aformentioned range listed, please consider follow up with your Primary Care Provider.   You have been scheduled for an endoscopy and colonoscopy. Please follow the written instructions given to you at your visit today. Please pick up your prep supplies at the pharmacy within the next 1-3 days. If you use inhalers (even only as needed), please bring them with you on the day of your procedure.

## 2021-01-04 NOTE — Progress Notes (Signed)
HISTORY OF PRESENT ILLNESS:  Tiffany [name] is a 70 y.o. female with multiple medical problems who has been seen in this office for GERD with esophagitis, history of fundoplication, adenomatous colon polyps, and chronic diarrhea with incontinence.  She is sent today by her primary care provider regarding Hemoccult positive stool.  No gross rectal bleeding.  She continues with diarrhea.  Up to 10 loose stools per day.  Imodium and Pepto-Bismol helps.  Reports change in stool caliber.  For pencillike.  She also reports dysphagia to food, pills, and liquids.  Last complete colonoscopy performed October 2017.  Found to have non-adenomatous colon polyps.  Biopsies of the colonic mucosa and duodenal mucosa were normal.  No evidence for microscopic colitis.  Upper endoscopy at that same time revealed normal EGD post fundoplication.  She was prescribed metronidazole.  Not sure if it helped diarrhea or not.  Review of blood work from April 2022 normal CBC with hemoglobin 11.3.  CT scan from December 2019 revealed no acute abnormalities.  She has completed her COVID vaccination series and boosters  REVIEW OF SYSTEMS:  All non-GI ROS negative unless otherwise stated in the HPI except for fatigue, shortness of breath, sinus allergies  Past Medical History:  Diagnosis Date  . Allergy   . Anemia    pernicious anemia- recieved iron infusions until 10-2015  . Anginal pain ([place])   . Anxiety   . Arthritis    foot drop  lt foot  / toes   wears brace     . Bronchiectasis ([employer])   . Cancer ([place])    SKIN    . Cataract    REMOVED  . Chronic cough   . CKD (chronic kidney disease)   . Colon polyps   . Coronary artery calcification seen on CT scan 12/22/2017   CT in 2016  . Depression   . Diabetes mellitus   . Dysphagia   . Dysrhythmia    RAPID HEART BEAT , CHEST TIGHTNESS  09/29/14     . Gastritis   . GERD (gastroesophageal reflux disease)   . Hiatal hernia   . History of echocardiogram    Echo 5/19:  Mild focal basal septal hypertrophy, EF 60-65, normal wall motion, grade 1 diastolic dysfunction, mildly dilated ascending aorta (38 mm), normal RVSF, mild TR, PASP 23  . Hyperlipidemia   . Hypertension   . IBS (irritable bowel syndrome)   . Insomnia   . Kidney stones    KIDNEY DISEASE   STAGE lll    . Kidney stones   . Macular disorder    MACULAR DISEASE  RT EYE   . Meningitis   . Osteoporosis   . Pneumonia    BRONCHIECTASIS  . Shortness of breath dyspnea    WITH EXERTION  . Sleep apnea    uses cpap  . Spina bifida occulta   . Ulcer     Past Surgical History:  Procedure Laterality Date  . ABDOMINAL HYSTERECTOMY    . BACK SURGERY    . CATARACT EXTRACTION  03/2012   bilateral  . COLONOSCOPY    . eardrum     left   ruptured and repaired  . ELBOW SURGERY     left  . fundoplication    . KNEE ARTHROSCOPY     rt knee   . LEFT HEART CATH AND CORONARY ANGIOGRAPHY N/A 12/25/2017   Procedure: LEFT HEART CATH AND CORONARY ANGIOGRAPHY;  Surgeon: Nelva Bush, MD;  Location: [facility];  Service: Cardiovascular;  Laterality: N/A;  . LITHOTRIPSY    . MENISCUS REPAIR    . POLYPECTOMY    . TONSILLECTOMY    . TOTAL KNEE ARTHROPLASTY Right 10/11/2014   Procedure: RIGHT TOTAL KNEE ARTHROPLASTY;  Surgeon: Mcarthur Rossetti, MD;  Location: [facility];  Service: Orthopedics;  Laterality: Right;    Social History Ashelynn Forrest [name]  reports that she has never smoked. She has never used smokeless tobacco. She reports that she does not drink alcohol and does not use drugs.  family history includes Alzheimer's disease in her father; Cancer in her mother; Colon cancer in her maternal grandmother and mother; Coronary artery disease in an other family member; Diabetes in her mother; Esophageal cancer in her cousin and cousin; Gout in her father; Heart attack (age of onset: 30) in her maternal grandmother; Heart attack (age of onset: 9) in her maternal grandfather; Hypertension in her  mother; Irritable bowel syndrome in her father; Nephrolithiasis in her brother and mother; Ovarian cancer in her mother; Thyroid cancer in her sister; Ulcers in her father.  Allergies  Allergen Reactions  . Ambien [Zolpidem Tartrate] Other (See Comments)    Nightmares and insomnia  . Aspirin Other (See Comments)    GI bleed  . Benicar [Olmesartan Medoxomil] Shortness Of Breath, Swelling and Other (See Comments)    Swelling of throat  . Codeine Nausea And Vomiting  . Erythromycin Nausea And Vomiting  . Ketorolac Anaphylaxis  . Ketorolac Tromethamine Anaphylaxis  . Latex Shortness Of Breath and Itching  . Lisinopril Shortness Of Breath, Swelling and Other (See Comments)     Swelling of throat  . Olmesartan Other (See Comments), Shortness Of Breath and Swelling    Swelling of throat  . Other Anaphylaxis    Peaches and mangoes, tree nuts, tomatoes, shellfish, Cat dandor, green peas  . Reclast [Zoledronic Acid] Other (See Comments)    Ocular reaction, toxic  . Toradol [Ketorolac Tromethamine] Anaphylaxis  . Zolpidem Other (See Comments)    Nightmares and insomnia  . Fenofibrate Nausea And Vomiting  . Glucosamine Diarrhea and Nausea Only  . Glucosamine Forte [Nutritional Supplements] Diarrhea and Nausea Only  . Levaquin [Levofloxacin In D5w] Diarrhea and Swelling  . Levofloxacin Diarrhea and Swelling  . Percocet [Oxycodone-Acetaminophen] Nausea And Vomiting  . Tramadol Nausea And Vomiting  . Atorvastatin Other (See Comments)  . Lomotil [Diphenoxylate-Atropine] Other (See Comments)    Hallucinations   . Metformin Hcl Other (See Comments)  . Tizanidine Hcl Other (See Comments)  . Venlafaxine Other (See Comments)  . Tizanidine Nausea And Vomiting       PHYSICAL EXAMINATION: Vital signs: BP 120/64   Pulse 67   Ht 5\' 3"  (1.6 m)   Wt 180 lb 6.4 oz (81.8 kg)   SpO2 99%   BMI 31.96 kg/m   Constitutional: generally well-appearing, no acute distress Psychiatric: alert and  oriented x3, cooperative Eyes: extraocular movements intact, anicteric, conjunctiva pink Mouth: oral pharynx moist, no lesions Neck: supple no lymphadenopathy Cardiovascular: heart regular rate and rhythm, no murmur Lungs: clear to auscultation bilaterally Abdomen: soft, nontender, nondistended, no obvious ascites, no peritoneal signs, normal bowel sounds, no organomegaly Rectal: Deferred until colonoscopy Extremities: no clubbing, cyanosis, or lower extremity edema bilaterally Skin: no lesions on visible extremities Neuro: No focal deficits.  Cranial nerves intact  ASSESSMENT:  1.  Hemoccult positive stool.  Normal hemoglobin.  No overt bleeding 2.  History of adenomatous polyps. 3.  GERD status post fundoplication  4.  Vague dysphagia 5.  Chronic diarrhea with incontinence.  Ongoing 6.  Multiple medical problems including diabetes mellitus   PLAN:  1.  Schedule colonoscopy with possible biopsies to evaluate Hemoccult-positive stool and chronic diarrhea.  Patient is high risk given her comorbidities.The nature of the procedure, as well as the risks, benefits, and alternatives were carefully and thoroughly reviewed with the patient. Ample time for discussion and questions allowed. The patient understood, was satisfied, and agreed to proceed. 2.  Schedule upper endoscopy with possible esophageal dilation to evaluate Hemoccult-positive stool and dysphagia.  Patient is high risk as above.The nature of the procedure, as well as the risks, benefits, and alternatives were carefully and thoroughly reviewed with the patient. Ample time for discussion and questions allowed. The patient understood, was satisfied, and agreed to proceed. 3.  Hold diabetic medications the day of the procedure to avoid hypoglycemia

## 2021-01-09 ENCOUNTER — Telehealth: Payer: Self-pay | Admitting: Internal Medicine

## 2021-01-09 NOTE — Telephone Encounter (Signed)
Tiffany [name] can you please help this pt. Thank you

## 2021-01-09 NOTE — Telephone Encounter (Signed)
Pt called and stated that she has a colonoscopy on 04/05/21. She was wondering if there is a generic or something else is can take other than Suprep due to low funds. Please give her a call. Thank you

## 2021-01-15 NOTE — Telephone Encounter (Signed)
Spoke with patient and told her I would change her prep to Plenvu and get her a samples.  I will do new instructions that she will view in mychart.  I told her I would call her when I had the sample for her to come pick up.  Patient agreed.

## 2021-01-16 NOTE — Telephone Encounter (Signed)
Spoke with patient and told her I had obtained a sample of Plenvu and would put it up front to be picked up.  Patient agreed.

## 2021-03-28 ENCOUNTER — Other Ambulatory Visit: Payer: Self-pay | Admitting: Internal Medicine

## 2021-03-28 DIAGNOSIS — Z1231 Encounter for screening mammogram for malignant neoplasm of breast: Secondary | ICD-10-CM

## 2021-04-05 ENCOUNTER — Ambulatory Visit (AMBULATORY_SURGERY_CENTER): Payer: Medicare Other | Admitting: Internal Medicine

## 2021-04-05 ENCOUNTER — Other Ambulatory Visit: Payer: Self-pay

## 2021-04-05 ENCOUNTER — Encounter: Payer: Self-pay | Admitting: Internal Medicine

## 2021-04-05 VITALS — BP 107/53 | HR 66 | Temp 98.2°F | Resp 21 | Ht 63.0 in | Wt 180.0 lb

## 2021-04-05 DIAGNOSIS — K2980 Duodenitis without bleeding: Secondary | ICD-10-CM | POA: Diagnosis not present

## 2021-04-05 DIAGNOSIS — D122 Benign neoplasm of ascending colon: Secondary | ICD-10-CM

## 2021-04-05 DIAGNOSIS — D120 Benign neoplasm of cecum: Secondary | ICD-10-CM

## 2021-04-05 DIAGNOSIS — D124 Benign neoplasm of descending colon: Secondary | ICD-10-CM

## 2021-04-05 DIAGNOSIS — K449 Diaphragmatic hernia without obstruction or gangrene: Secondary | ICD-10-CM

## 2021-04-05 DIAGNOSIS — R197 Diarrhea, unspecified: Secondary | ICD-10-CM | POA: Diagnosis not present

## 2021-04-05 DIAGNOSIS — Z86010 Personal history of colonic polyps: Secondary | ICD-10-CM

## 2021-04-05 DIAGNOSIS — R1310 Dysphagia, unspecified: Secondary | ICD-10-CM

## 2021-04-05 DIAGNOSIS — K219 Gastro-esophageal reflux disease without esophagitis: Secondary | ICD-10-CM | POA: Diagnosis not present

## 2021-04-05 DIAGNOSIS — R195 Other fecal abnormalities: Secondary | ICD-10-CM | POA: Diagnosis not present

## 2021-04-05 DIAGNOSIS — K2990 Gastroduodenitis, unspecified, without bleeding: Secondary | ICD-10-CM | POA: Diagnosis not present

## 2021-04-05 DIAGNOSIS — D123 Benign neoplasm of transverse colon: Secondary | ICD-10-CM | POA: Diagnosis not present

## 2021-04-05 MED ORDER — SODIUM CHLORIDE 0.9 % IV SOLN: 500.0000 mL | Freq: Once | INTRAVENOUS | Status: DC

## 2021-04-05 NOTE — Op Note (Signed)
[place] Patient Name: Tiffany [name] Procedure Date: 04/05/2021 8:44 AM MRN: [id number] Endoscopist: Docia Chuck. Henrene Pastor , MD Age: 70 Referring MD:  Date of Birth: 12-May-1951 Gender: Female Account #: [id number] Procedure:                Upper GI endoscopy with biopsy; with balloon                            dilation of the esophagus?"20 mm Indications:              Dysphagia, Esophageal reflux. Prior history of                            fundoplication Medicines:                Monitored Anesthesia Care Procedure:                Pre-Anesthesia Assessment:                           - Prior to the procedure, a History and Physical                            was performed, and patient medications and                            allergies were reviewed. The patient's tolerance of                            previous anesthesia was also reviewed. The risks                            and benefits of the procedure and the sedation                            options and risks were discussed with the patient.                            All questions were answered, and informed consent                            was obtained. Prior Anticoagulants: The patient has                            taken no previous anticoagulant or antiplatelet                            agents. ASA Grade Assessment: II - A patient with                            mild systemic disease. After reviewing the risks                            and benefits, the patient was deemed in  satisfactory condition to undergo the procedure.                           After obtaining informed consent, the endoscope was                            passed under direct vision. Throughout the                            procedure, the patient's blood pressure, pulse, and                            oxygen saturations were monitored continuously. The                            GIF [id number] was introduced  through the                            mouth, and advanced to the second part of duodenum.                            The upper GI endoscopy was accomplished without                            difficulty. The patient tolerated the procedure                            well. Scope In: Scope Out: Findings:                 The esophagus revealed a subtle ringlike stricture                            at the gastroesophageal junction (35 cm from the                            teeth). No active inflammation. No other esophageal                            abnormalities. After completing the endoscopic                            survey a TTS dilator was passed through the scope.                            Dilation with an 18-19-20 mm balloon dilator was                            performed to 20 mm.                           The stomach revealed evidence of prior                            fundoplication and a small hiatal  hernia with                            associated erosion and mild nonspecific antral                            erythema. Biopsies were taken from the gastric                            antrum with a cold forceps for histology.                           The examined duodenum revealed duodenitis involving                            the bulb. The postbulbar duodenum was normal.                           The cardia and gastric fundus were normal on                            retroflexion. Complications:            No immediate complications. Estimated Blood Loss:     Estimated blood loss: none. Impression:               - Normal esophagus. Dilated.                           - Normal stomach. Biopsied.                           - Normal examined duodenum. Recommendation:           - Patient has a contact number available for                            emergencies. The signs and symptoms of potential                            delayed complications were discussed with the                             patient. Return to normal activities tomorrow.                            Written discharge instructions were provided to the                            patient.                           - Resume previous diet.                           - Continue present medications.                           -  Await pathology results. Docia Chuck. Henrene Pastor, MD 04/05/2021 9:56:11 AM This report has been signed electronically.

## 2021-04-05 NOTE — Progress Notes (Signed)
GI PREPROCEDURE NOTE  Patient presents today for colonoscopy to evaluate Hemoccult-positive stool, chronic diarrhea, and colon polyp surveillance.  Also upper endoscopy to evaluate Hemoccult-positive stool and dysphagia.  She was seen and underwent comprehensive office evaluation Jan 04, 2021.  At that encounter as outlined below:  HISTORY OF PRESENT ILLNESS:   Tiffany [name] is a 70 y.o. female with multiple medical problems who has been seen in this office for GERD with esophagitis, history of fundoplication, adenomatous colon polyps, and chronic diarrhea with incontinence.  She is sent today by her primary care provider regarding Hemoccult positive stool.  No gross rectal bleeding.  She continues with diarrhea.  Up to 10 loose stools per day.  Imodium and Pepto-Bismol helps.  Reports change in stool caliber.  For pencillike.  She also reports dysphagia to food, pills, and liquids.  Last complete colonoscopy performed October 2017.  Found to have non-adenomatous colon polyps.  Biopsies of the colonic mucosa and duodenal mucosa were normal.  No evidence for microscopic colitis.  Upper endoscopy at that same time revealed normal EGD post fundoplication.  She was prescribed metronidazole.  Not sure if it helped diarrhea or not.  Review of blood work from April 2022 normal CBC with hemoglobin 11.3.  CT scan from December 2019 revealed no acute abnormalities.  She has completed her COVID vaccination series and boosters   REVIEW OF SYSTEMS:   All non-GI ROS negative unless otherwise stated in the HPI except for fatigue, shortness of breath, sinus allergies       Past Medical History:  Diagnosis Date   Allergy     Anemia      pernicious anemia- recieved iron infusions until 10-2015   Anginal pain (HCC)     Anxiety     Arthritis      foot drop  lt foot  / toes   wears brace      Bronchiectasis (HCC)     Cancer (HCC)      SKIN     Cataract      REMOVED   Chronic cough     CKD (chronic  kidney disease)     Colon polyps     Coronary artery calcification seen on CT scan 12/22/2017    CT in 2016   Depression     Diabetes mellitus     Dysphagia     Dysrhythmia      RAPID HEART BEAT , CHEST TIGHTNESS  09/29/14      Gastritis     GERD (gastroesophageal reflux disease)     Hiatal hernia     History of echocardiogram      Echo 5/19: Mild focal basal septal hypertrophy, EF 60-65, normal wall motion, grade 1 diastolic dysfunction, mildly dilated ascending aorta (38 mm), normal RVSF, mild TR, PASP 23   Hyperlipidemia     Hypertension     IBS (irritable bowel syndrome)     Insomnia     Kidney stones      KIDNEY DISEASE   STAGE lll     Kidney stones     Macular disorder      MACULAR DISEASE  RT EYE    Meningitis     Osteoporosis     Pneumonia      BRONCHIECTASIS   Shortness of breath dyspnea      WITH EXERTION   Sleep apnea      uses cpap   Spina bifida occulta     Ulcer  Past Surgical History:  Procedure Laterality Date   ABDOMINAL HYSTERECTOMY       BACK SURGERY       CATARACT EXTRACTION   03/2012    bilateral   COLONOSCOPY       eardrum        left   ruptured and repaired   ELBOW SURGERY        left   fundoplication       KNEE ARTHROSCOPY        rt knee    LEFT HEART CATH AND CORONARY ANGIOGRAPHY N/A 12/25/2017    Procedure: LEFT HEART CATH AND CORONARY ANGIOGRAPHY;  Surgeon: Nelva Bush, MD;  Location: [facility];  Service: Cardiovascular;  Laterality: N/A;   LITHOTRIPSY       MENISCUS REPAIR       POLYPECTOMY       TONSILLECTOMY       TOTAL KNEE ARTHROPLASTY Right 10/11/2014    Procedure: RIGHT TOTAL KNEE ARTHROPLASTY;  Surgeon: Mcarthur Rossetti, MD;  Location: [facility];  Service: Orthopedics;  Laterality: Right;      Social History Tiffany [name]  reports that she has never smoked. She has never used smokeless tobacco. She reports that she does not drink alcohol and does not use drugs.   family history includes  Alzheimer's disease in her father; Cancer in her mother; Colon cancer in her maternal grandmother and mother; Coronary artery disease in an other family member; Diabetes in her mother; Esophageal cancer in her cousin and cousin; Gout in her father; Heart attack (age of onset: 78) in her maternal grandmother; Heart attack (age of onset: 5) in her maternal grandfather; Hypertension in her mother; Irritable bowel syndrome in her father; Nephrolithiasis in her brother and mother; Ovarian cancer in her mother; Thyroid cancer in her sister; Ulcers in her father.        Allergies  Allergen Reactions   Ambien [Zolpidem Tartrate] Other (See Comments)      Nightmares and insomnia   Aspirin Other (See Comments)      GI bleed   Benicar [Olmesartan Medoxomil] Shortness Of Breath, Swelling and Other (See Comments)      Swelling of throat   Codeine Nausea And Vomiting   Erythromycin Nausea And Vomiting   Ketorolac Anaphylaxis   Ketorolac Tromethamine Anaphylaxis   Latex Shortness Of Breath and Itching   Lisinopril Shortness Of Breath, Swelling and Other (See Comments)       Swelling of throat   Olmesartan Other (See Comments), Shortness Of Breath and Swelling      Swelling of throat   Other Anaphylaxis      Peaches and mangoes, tree nuts, tomatoes, shellfish, Cat dandor, green peas   Reclast [Zoledronic Acid] Other (See Comments)      Ocular reaction, toxic   Toradol [Ketorolac Tromethamine] Anaphylaxis   Zolpidem Other (See Comments)      Nightmares and insomnia   Fenofibrate Nausea And Vomiting   Glucosamine Diarrhea and Nausea Only   Glucosamine Forte [Nutritional Supplements] Diarrhea and Nausea Only   Levaquin [Levofloxacin In D5w] Diarrhea and Swelling   Levofloxacin Diarrhea and Swelling   Percocet [Oxycodone-Acetaminophen] Nausea And Vomiting   Tramadol Nausea And Vomiting   Atorvastatin Other (See Comments)   Lomotil [Diphenoxylate-Atropine] Other (See Comments)      Hallucinations      Metformin Hcl Other (See Comments)   Tizanidine Hcl Other (See Comments)   Venlafaxine Other (See Comments)   Tizanidine  Nausea And Vomiting          PHYSICAL EXAMINATION: Vital signs: BP 120/64   Pulse 67   Ht '5\' 3"'$  (1.6 m)   Wt 180 lb 6.4 oz (81.8 kg)   SpO2 99%   BMI 31.96 kg/m   Constitutional: generally well-appearing, no acute distress Psychiatric: alert and oriented x3, cooperative Eyes: extraocular movements intact, anicteric, conjunctiva pink Mouth: oral pharynx moist, no lesions Neck: supple no lymphadenopathy Cardiovascular: heart regular rate and rhythm, no murmur Lungs: clear to auscultation bilaterally Abdomen: soft, nontender, nondistended, no obvious ascites, no peritoneal signs, normal bowel sounds, no organomegaly Rectal: Deferred until colonoscopy Extremities: no clubbing, cyanosis, or lower extremity edema bilaterally Skin: no lesions on visible extremities Neuro: No focal deficits.  Cranial nerves intact   ASSESSMENT:   1.  Hemoccult positive stool.  Normal hemoglobin.  No overt bleeding 2.  History of adenomatous polyps. 3.  GERD status post fundoplication 4.  Vague dysphagia 5.  Chronic diarrhea with incontinence.  Ongoing 6.  Multiple medical problems including diabetes mellitus     PLAN:   1.  Schedule colonoscopy with possible biopsies to evaluate Hemoccult-positive stool and chronic diarrhea.  Patient is high risk given her comorbidities.The nature of the procedure, as well as the risks, benefits, and alternatives were carefully and thoroughly reviewed with the patient. Ample time for discussion and questions allowed. The patient understood, was satisfied, and agreed to proceed. 2.  Schedule upper endoscopy with possible esophageal dilation to evaluate Hemoccult-positive stool and dysphagia.  Patient is high risk as above.The nature of the procedure, as well as the risks, benefits, and alternatives were carefully and thoroughly reviewed with the  patient. Ample time for discussion and questions allowed. The patient understood, was satisfied, and agreed to proceed. 3.  Hold diabetic medications the day of the procedure to avoid hypoglycemia   There have been no interval changes clinically or with the physical examination.  The MD preprocedure charting has been completed as well.  Docia Chuck. Geri Seminole., M.D. [facility] Division of Gastroenterology

## 2021-04-05 NOTE — Progress Notes (Signed)
Report to PACU, RN, vss, BBS= Clear.  

## 2021-04-05 NOTE — Progress Notes (Signed)
Called to room to assist during endoscopic procedure.  Patient ID and intended procedure confirmed with present staff. Received instructions for my participation in the procedure from the performing physician.  

## 2021-04-05 NOTE — Patient Instructions (Addendum)
Impression/Recommendations:  Post dilation diet, Polyp, diverticulosis, and hemorrhoid handouts given to patient.  Resume previous diet. Continue present medications. Await pathology results.  Repeat colonoscopy in 3-5 years for surveillance.  YOU HAD AN ENDOSCOPIC PROCEDURE TODAY AT [facility]:   Refer to the procedure report that was given to you for any specific questions about what was found during the examination.  If the procedure report does not answer your questions, please call your gastroenterologist to clarify.  If you requested that your care partner not be given the details of your procedure findings, then the procedure report has been included in a sealed envelope for you to review at your convenience later.  YOU SHOULD EXPECT: Some feelings of bloating in the abdomen. Passage of more gas than usual.  Walking can help get rid of the air that was put into your GI tract during the procedure and reduce the bloating. If you had a lower endoscopy (such as a colonoscopy or flexible sigmoidoscopy) you may notice spotting of blood in your stool or on the toilet paper. If you underwent a bowel prep for your procedure, you may not have a normal bowel movement for a few days.  Please Note:  You might notice some irritation and congestion in your nose or some drainage.  This is from the oxygen used during your procedure.  There is no need for concern and it should clear up in a day or so.  SYMPTOMS TO REPORT IMMEDIATELY:  Following lower endoscopy (colonoscopy or flexible sigmoidoscopy):  Excessive amounts of blood in the stool  Significant tenderness or worsening of abdominal pains  Swelling of the abdomen that is new, acute  Fever of 100F or higher  Following upper endoscopy (EGD)  Vomiting of blood or coffee ground material  New chest pain or pain under the shoulder blades  Painful or persistently difficult swallowing  New shortness of breath  Fever of 100F or  higher  Black, tarry-looking stools  For urgent or emergent issues, a gastroenterologist can be reached at any hour by calling [phone]. Do not use MyChart messaging for urgent concerns.    DIET:  We do recommend a small meal at first, but then you may proceed to your regular diet.  Drink plenty of fluids but you should avoid alcoholic beverages for 24 hours.  ACTIVITY:  You should plan to take it easy for the rest of today and you should NOT DRIVE or use heavy machinery until tomorrow (because of the sedation medicines used during the test).    FOLLOW UP: Our staff will call the number listed on your records 48-72 hours following your procedure to check on you and address any questions or concerns that you may have regarding the information given to you following your procedure. If we do not reach you, we will leave a message.  We will attempt to reach you two times.  During this call, we will ask if you have developed any symptoms of COVID 19. If you develop any symptoms (ie: fever, flu-like symptoms, shortness of breath, cough etc.) before then, please call [phone].  If you test positive for Covid 19 in the 2 weeks post procedure, please call and report this information to [place].    If any biopsies were taken you will be contacted by phone or by letter within the next 1-3 weeks.  Please call us at [phone] if you have not heard about the biopsies in 3 weeks.    SIGNATURES/CONFIDENTIALITY:  You and/or your care partner have signed paperwork which will be entered into your electronic medical record.  These signatures attest to the fact that that the information above on your After Visit Summary has been reviewed and is understood.  Full responsibility of the confidentiality of this discharge information lies with you and/or your care-partner.

## 2021-04-05 NOTE — Op Note (Signed)
[place] Patient Name: Tiffany [name] Procedure Date: 04/05/2021 8:52 AM MRN: [id number] Endoscopist: Docia Chuck. Henrene Pastor , MD Age: 70 Referring MD:  Date of Birth: [birth year] Gender: Female Account #: [id number] Procedure:                Colonoscopy with cold snare polypectomy x 5; with                            biopsies Indications:              Heme positive stool, Incidental diarrhea. History                            of multiple adenomatous colon polyps. Previous                            examinations 2006, 2009, 2014, 2017 Medicines:                Monitored Anesthesia Care Procedure:                Pre-Anesthesia Assessment:                           - Prior to the procedure, a History and Physical                            was performed, and patient medications and                            allergies were reviewed. The patient's tolerance of                            previous anesthesia was also reviewed. The risks                            and benefits of the procedure and the sedation                            options and risks were discussed with the patient.                            All questions were answered, and informed consent                            was obtained. Prior Anticoagulants: The patient has                            taken no previous anticoagulant or antiplatelet                            agents. ASA Grade Assessment: II - A patient with                            mild systemic disease. After reviewing the risks  and benefits, the patient was deemed in                            satisfactory condition to undergo the procedure.                           After obtaining informed consent, the colonoscope                            was passed under direct vision. Throughout the                            procedure, the patient's blood pressure, pulse, and                            oxygen saturations were monitored  continuously. The                            Olympus CF-[id number]) Colonoscope was                            introduced through the anus and advanced to the the                            cecum, identified by appendiceal orifice and                            ileocecal valve. The ileocecal valve, appendiceal                            orifice, and rectum were photographed. The quality                            of the bowel preparation was excellent. The                            colonoscopy was performed without difficulty. The                            patient tolerated the procedure well. The bowel                            preparation used was Plenvu via split dose                            instruction. Scope In: 9:05:11 AM Scope Out: 9:29:55 AM Scope Withdrawal Time: 0 hours 20 minutes 4 seconds  Total Procedure Duration: 0 hours 24 minutes 44 seconds  Findings:                 Five polyps were found in the descending colon,                            transverse colon, ascending colon and cecum. The  polyps were 1 to 5 mm in size. These polyps were                            removed with a cold snare. Resection and retrieval                            were complete.                           Multiple diverticula were found in the sigmoid                            colon.                           Internal hemorrhoids were found during retroflexion.                           The exam was otherwise without abnormality on                            direct and retroflexion views. Random colon                            biopsies were taken to rule out microscopic colitis Complications:            No immediate complications. Estimated blood loss:                            None. Estimated Blood Loss:     Estimated blood loss: none. Impression:               - Five 1 to 5 mm polyps in the descending colon, in                            the transverse  colon, in the ascending colon and in                            the cecum, removed with a cold snare. Resected and                            retrieved.                           - Diverticulosis in the sigmoid colon.                           - Internal hemorrhoids.                           - The examination was otherwise normal on direct                            and retroflexion views. Recommendation:           - Repeat colonoscopy in 3 - 5 years for  surveillance.                           - Patient has a contact number available for                            emergencies. The signs and symptoms of potential                            delayed complications were discussed with the                            patient. Return to normal activities tomorrow.                            Written discharge instructions were provided to the                            patient.                           - Resume previous diet.                           - Continue present medications.                           - Await pathology results. Docia Chuck. Henrene Pastor, MD 04/05/2021 9:51:06 AM This report has been signed electronically.

## 2021-04-05 NOTE — Progress Notes (Signed)
Medical history reviewed with no changes noted. VS assessed by C.W 

## 2021-04-09 ENCOUNTER — Telehealth: Payer: Self-pay

## 2021-04-09 ENCOUNTER — Telehealth: Payer: Self-pay | Admitting: *Deleted

## 2021-04-09 NOTE — Telephone Encounter (Signed)
No answer, left a message to call if having any issues or concerns, B.Evi Mccomb RN. °

## 2021-04-09 NOTE — Telephone Encounter (Signed)
Left message on f/u call 

## 2021-04-10 ENCOUNTER — Encounter: Payer: Self-pay | Admitting: Internal Medicine

## 2021-04-19 ENCOUNTER — Ambulatory Visit: Payer: Medicare Other | Admitting: Orthopaedic Surgery

## 2021-05-10 ENCOUNTER — Other Ambulatory Visit: Payer: Self-pay | Admitting: Nephrology

## 2021-05-10 DIAGNOSIS — N200 Calculus of kidney: Secondary | ICD-10-CM

## 2021-05-11 ENCOUNTER — Ambulatory Visit
Admission: RE | Admit: 2021-05-11 | Discharge: 2021-05-11 | Disposition: A | Discharge status: Home / Self Care | Payer: Medicare Other | Source: Ambulatory Visit | Attending: Nephrology | Admitting: Nephrology

## 2021-05-11 DIAGNOSIS — N200 Calculus of kidney: Secondary | ICD-10-CM

## 2021-05-14 ENCOUNTER — Other Ambulatory Visit: Payer: Self-pay | Admitting: Nephrology

## 2021-05-14 DIAGNOSIS — N200 Calculus of kidney: Secondary | ICD-10-CM

## 2021-05-17 ENCOUNTER — Ambulatory Visit
Admission: RE | Admit: 2021-05-17 | Discharge: 2021-05-17 | Disposition: A | Discharge status: Home / Self Care | Payer: Medicare Other | Source: Ambulatory Visit | Attending: Internal Medicine | Admitting: Internal Medicine

## 2021-05-17 ENCOUNTER — Other Ambulatory Visit: Payer: Self-pay

## 2021-05-17 DIAGNOSIS — Z1231 Encounter for screening mammogram for malignant neoplasm of breast: Secondary | ICD-10-CM

## 2021-05-23 ENCOUNTER — Ambulatory Visit: Payer: Medicare Other | Attending: Internal Medicine

## 2021-05-23 DIAGNOSIS — Z23 Encounter for immunization: Secondary | ICD-10-CM

## 2021-05-23 NOTE — Progress Notes (Signed)
   Covid-19 Vaccination Clinic  Name:  Tiffany [name]    MRN: [id number] DOB: [birth year]  05/23/2021  [name] was observed post Covid-19 immunization for 15 minutes without incident. She was provided with Vaccine Information Sheet and instruction to access the V-Safe system.   [name] was instructed to call 911 with any severe reactions post vaccine: Difficulty breathing  Swelling of face and throat  A fast heartbeat  A bad rash all over body  Dizziness and weakness

## 2021-06-04 ENCOUNTER — Other Ambulatory Visit (HOSPITAL_BASED_OUTPATIENT_CLINIC_OR_DEPARTMENT_OTHER): Payer: Self-pay

## 2021-06-04 MED ORDER — COVID-19MRNA BIVAL VACC PFIZER 30 MCG/0.3ML IM SUSP
INTRAMUSCULAR | 0 refills | Status: DC
  Filled 2021-06-04: qty 0.3, 1d supply, fill #0

## 2021-08-06 ENCOUNTER — Ambulatory Visit: Payer: Medicare Other | Admitting: Orthopaedic Surgery

## 2021-08-06 ENCOUNTER — Ambulatory Visit: Payer: Self-pay

## 2021-08-06 DIAGNOSIS — M25551 Pain in right hip: Secondary | ICD-10-CM

## 2021-08-06 MED ORDER — TRAMADOL HCL 50 MG PO TABS: 50.0000 mg | ORAL_TABLET | Freq: Four times a day (QID) | ORAL | 0 refills | Status: DC | PRN

## 2021-08-06 MED ORDER — METHOCARBAMOL 750 MG PO TABS: 750.0000 mg | ORAL_TABLET | Freq: Three times a day (TID) | ORAL | 1 refills | Status: DC | PRN

## 2021-08-06 NOTE — Progress Notes (Signed)
The patient is someone I have not seen in some time.  She fell off her bed last week landing on her right hip.  She says she has had incredibly bad pain in her right hip since then and points to the side of her hip in the groin as source of her pain.  It has been difficult for walking and weightbearing but she has been able to walk and weight-bear.  She is walking with a rolling walker today.  She has had no other acute change in her medical status.  She cannot take anti-inflammatories due to chronic kidney disease.  She is also diabetic.  She has a lot of allergies to pain medications as well.  On exam I can put her right hip through internal and external rotation with minimal discomfort and no blocks to rotation.  Her left hip also moves normally and fluidly.  An AP pelvis and lateral right hip shows no obvious fracture lines about the right hip as well.  I think this is good to see for an injury that happened a week ago and she has been weightbearing.  I gave her reassurance that I do not see a fracture thus far.  I will send in some methocarbamol and tramadol and see if this will help and she will rest her hip as well.  We can reevaluate her in 4 weeks.  If things worsen she will let us know and we would need to obtain an MRI of the right hip.

## 2021-08-16 ENCOUNTER — Telehealth: Payer: Self-pay | Admitting: Pulmonary Disease

## 2021-08-16 NOTE — Telephone Encounter (Signed)
ATC Patient.  LM to call back. 

## 2021-08-16 NOTE — Telephone Encounter (Signed)
Called and spoke with pt who states that she had symptoms that began 4 days ago. Pt said she started having chest congestion, increased SOB, cough with green phlegm, and wheezing. States that she took a covid test which was negative.  Pt denies any complaints of fever, states that she did feel warm but when she checked her temp, temp was 98.6.  Pt said that she has been taking mucinex, tylenol as needed, along with the rest of her meds. Stated that she has had to use her albuterol inhaler at least twice a day to help with her symptoms. Pt has also been using her flutter valve as directed.  Pt said that she had a refill of doxycycline from a prior time that she did start taking once her symptoms began.  Pt wants to know if there is anything else that could be recommended. Dr. Annamaria Boots, please advise.  Allergies  Allergen Reactions   Ambien [Zolpidem Tartrate] Other (See Comments)    Nightmares and insomnia   Aspirin Other (See Comments)    GI bleed   Benicar [Olmesartan Medoxomil] Shortness Of Breath, Swelling and Other (See Comments)    Swelling of throat   Codeine Nausea And Vomiting   Erythromycin Nausea And Vomiting   Ketorolac Anaphylaxis   Ketorolac Tromethamine Anaphylaxis   Latex Shortness Of Breath and Itching   Lisinopril Shortness Of Breath, Swelling and Other (See Comments)     Swelling of throat   Olmesartan Other (See Comments), Shortness Of Breath and Swelling    Swelling of throat   Other Anaphylaxis    Peaches and mangoes, tree nuts, tomatoes, shellfish, Cat dandor, green peas   Reclast [Zoledronic Acid] Other (See Comments)    Ocular reaction, toxic   Toradol [Ketorolac Tromethamine] Anaphylaxis   Zolpidem Other (See Comments)    Nightmares and insomnia   Fenofibrate Nausea And Vomiting   Glucosamine Diarrhea and Nausea Only   Glucosamine Forte [Nutritional Supplements] Diarrhea and Nausea Only   Levaquin [Levofloxacin In D5w] Diarrhea and Swelling   Levofloxacin  Diarrhea and Swelling   Percocet [Oxycodone-Acetaminophen] Nausea And Vomiting   Tramadol Nausea And Vomiting   Atorvastatin Other (See Comments)   Lomotil [Diphenoxylate-Atropine] Other (See Comments)    Hallucinations    Metformin Hcl Other (See Comments)   Tizanidine Hcl Other (See Comments)   Venlafaxine Other (See Comments)   Tizanidine Nausea And Vomiting     Current Outpatient Medications:    acetaminophen (TYLENOL) 500 MG tablet, Take 1,000 mg by mouth every 8 (eight) hours as needed for mild pain or headache. , Disp: , Rfl:    albuterol (VENTOLIN HFA) 108 (90 Base) MCG/ACT inhaler, INL 2 PFS PO Q 4 H PRF WHZ OR SOB, Disp: , Rfl:    albuterol (VENTOLIN HFA) 108 (90 Base) MCG/ACT inhaler, Inhale 1-2 puffs into the lungs every 6 (six) hours as needed for wheezing or shortness of breath., Disp: 8 g, Rfl: 2   amLODipine (NORVASC) 2.5 MG tablet, Take one tablet by mouth daily in addition to your Amlodipine 5mg  for a total of 7.5mg  once daily, Disp: 90 tablet, Rfl: 3   amLODipine (NORVASC) 5 MG tablet, Take one tablet by mouth daily along with an Amlodipine 2.5mg  tablet for a total of 7.5mg , Disp: 90 tablet, Rfl: 3   atorvastatin (LIPITOR) 40 MG tablet, Take 1 tablet (40 mg total) by mouth daily., Disp: 90 tablet, Rfl: 3   calcium carbonate (OS-CAL) 600 MG TABS tablet, Take 600 mg by  mouth 3 (three) times daily with meals., Disp: , Rfl:    Cholecalciferol (VITAMIN D PO), Take 5,000 Units by mouth daily. , Disp: , Rfl:    COVID-19 mRNA bivalent vaccine, Pfizer, injection, Inject into the muscle., Disp: 0.3 mL, Rfl: 0   diazepam (VALIUM) 10 MG tablet, Take 10 mg by mouth at bedtime as needed for anxiety or sleep. , Disp: , Rfl:    diphenhydrAMINE (BENADRYL) 25 mg capsule, Take 25 mg by mouth every 6 (six) hours as needed for allergies., Disp: , Rfl:    doxycycline (VIBRA-TABS) 100 MG tablet, Take 1 tablet (100 mg total) by mouth 2 (two) times daily. (Patient not taking: Reported on  04/05/2021), Disp: 14 tablet, Rfl: 0   DULoxetine (CYMBALTA) 30 MG capsule, Take 60 mg by mouth daily. , Disp: , Rfl: 6   EPIPEN 2-PAK 0.3 MG/0.3ML SOAJ injection, Inject 0.3 mg into the muscle once.  (Patient not taking: Reported on 04/05/2021), Disp: , Rfl: 0   furosemide (LASIX) 40 MG tablet, Take 20 mg by mouth daily as needed for fluid or edema. , Disp: , Rfl:    hydrALAZINE (APRESOLINE) 50 MG tablet, TAKE 1 TABLET BY MOUTH 3  TIMES DAILY, Disp: 270 tablet, Rfl: 3   loperamide (IMODIUM) 2 MG capsule, Take 2 mg by mouth as needed for diarrhea or loose stools., Disp: , Rfl:    loratadine (CLARITIN) 10 MG tablet, Take 10 mg by mouth daily as needed for allergies. , Disp: , Rfl:    metFORMIN (GLUCOPHAGE-XR) 500 MG 24 hr tablet, TK 1 T PO BID, Disp: , Rfl:    methocarbamol (ROBAXIN) 750 MG tablet, Take 1 tablet (750 mg total) by mouth every 8 (eight) hours as needed for muscle spasms., Disp: 60 tablet, Rfl: 1   metoprolol succinate (TOPROL-XL) 50 MG 24 hr tablet, Take 1 tablet (50 mg total) by mouth 2 (two) times daily. Take with or immediately following a meal. (Patient taking differently: Take 75 mg by mouth in the morning, at noon, and at bedtime. Take with or immediately following a meal.), Disp: 180 tablet, Rfl: 3   NITROSTAT 0.4 MG SL tablet, Place 0.4 mg under the tongue every 5 (five) minutes as needed for chest pain.  (Patient not taking: Reported on 04/05/2021), Disp: , Rfl: 3   Polyethyl Glycol-Propyl Glycol 0.4-0.3 % SOLN, Place 2 drops into both eyes 2 (two) times daily. , Disp: , Rfl:    promethazine (PHENERGAN) 25 MG tablet, Take 25 mg by mouth every 6 (six) hours as needed for nausea or vomiting. , Disp: , Rfl:    traMADol (ULTRAM) 50 MG tablet, Take 1-2 tablets (50-100 mg total) by mouth every 6 (six) hours as needed., Disp: 40 tablet, Rfl: 0   Turmeric (QC TUMERIC COMPLEX PO), Take by mouth., Disp: , Rfl:

## 2021-08-16 NOTE — Telephone Encounter (Signed)
Offer prednisone 20 mg, # 5, 1 daily. This should help wheezing, but will raise her blood sugar for a few days.  Take the doxycycline.

## 2021-08-17 NOTE — Telephone Encounter (Signed)
Called her mobile and home and there was no answer-LMTCB.

## 2021-08-17 NOTE — Telephone Encounter (Signed)
I called the patient and she does not want to take the prednisone at this time due to her blood sugars going up. She is going to keep taking her Mucinex, and gargle salt water to help clear the mucus out of her throat. Nothing further at this time.

## 2021-08-21 NOTE — Telephone Encounter (Signed)
Agree that she needs to be seen, with a CXR. Nothing really to add, but if she progressively worsens and cannot wait until 12/30 then I recommend that she seek urgent care so she can have CXR and eval sooner.

## 2021-08-21 NOTE — Telephone Encounter (Signed)
Called and spoke with patient to let her know the recs from Dr. Lamonte Sakai. Advised her that at this time we don't have anything sooner but that she can call the office later to see if there has been any cancellations to be seen sooner and if she gets worse before Friday for her to seek care at urgent care. Patient expressed understanding. Nothing further needed at this time.

## 2021-08-21 NOTE — Telephone Encounter (Signed)
Called and spoke with patient who states that she is still sick and thinks that she has pneumonia. Finished antibiotic and still using mucinex dm. coughing a lot of green muscous. Has had a fever over the weekend. Last known fever was Christmas day. She is scheduled for an appt on friday, 12/30. Negative for covid as of Christmas day but lost taste and smell and has lost 6 pounds. Stage 3 diabetic and is not on insulin so is wanting to be cautious with Prednisone if it comes down to it but asking for other recommendations.   Dr. Lamonte Sakai please advise

## 2021-08-24 ENCOUNTER — Ambulatory Visit: Payer: Medicare Other | Admitting: Nurse Practitioner

## 2021-08-24 ENCOUNTER — Ambulatory Visit (INDEPENDENT_AMBULATORY_CARE_PROVIDER_SITE_OTHER): Payer: Medicare Other

## 2021-08-24 ENCOUNTER — Other Ambulatory Visit: Payer: Self-pay

## 2021-08-24 ENCOUNTER — Encounter: Payer: Self-pay | Admitting: Nurse Practitioner

## 2021-08-24 VITALS — BP 118/78 | HR 74 | Temp 98.2°F | Ht 63.0 in | Wt 174.0 lb

## 2021-08-24 DIAGNOSIS — J471 Bronchiectasis with (acute) exacerbation: Secondary | ICD-10-CM

## 2021-08-24 DIAGNOSIS — B9689 Other specified bacterial agents as the cause of diseases classified elsewhere: Secondary | ICD-10-CM | POA: Diagnosis not present

## 2021-08-24 DIAGNOSIS — J019 Acute sinusitis, unspecified: Secondary | ICD-10-CM

## 2021-08-24 DIAGNOSIS — G4733 Obstructive sleep apnea (adult) (pediatric): Secondary | ICD-10-CM | POA: Diagnosis not present

## 2021-08-24 DIAGNOSIS — Z9989 Dependence on other enabling machines and devices: Secondary | ICD-10-CM

## 2021-08-24 MED ORDER — FLUTICASONE PROPIONATE 50 MCG/ACT NA SUSP: 1.0000 | Freq: Every day | NASAL | 2 refills | Status: DC

## 2021-08-24 MED ORDER — GUAIFENESIN ER 600 MG PO TB12: 1200.0000 mg | ORAL_TABLET | Freq: Two times a day (BID) | ORAL | 3 refills | Status: DC

## 2021-08-24 MED ORDER — AMOXICILLIN-POT CLAVULANATE 875-125 MG PO TABS: 1.0000 | ORAL_TABLET | Freq: Two times a day (BID) | ORAL | 0 refills | Status: DC

## 2021-08-24 MED ORDER — SODIUM CHLORIDE 3 % IN NEBU: INHALATION_SOLUTION | Freq: Two times a day (BID) | RESPIRATORY_TRACT | 6 refills | Status: DC

## 2021-08-24 MED ORDER — PREDNISONE 20 MG PO TABS: 20.0000 mg | ORAL_TABLET | Freq: Every day | ORAL | 0 refills | Status: DC

## 2021-08-24 MED ORDER — BENZONATATE 200 MG PO CAPS: 200.0000 mg | ORAL_CAPSULE | Freq: Three times a day (TID) | ORAL | 1 refills | Status: DC | PRN

## 2021-08-24 NOTE — Assessment & Plan Note (Signed)
Previously treated with doxy with slight improvement but worsened after stopping. Will treat with augmentin 875 Twice daily for 7 days. Flonase nasal spray daily. Supportive care. See above.

## 2021-08-24 NOTE — Assessment & Plan Note (Signed)
Noncompliant with therapy due to the mask making her constantly sweat at night. Mild OSA. Referral to orthodontics for oral appliance fitting. Also provided with contact to ENT for possible surgical intervention if desired.Discussed risk of untreated OSA. See above.

## 2021-08-24 NOTE — Assessment & Plan Note (Signed)
Bronchiectasis flare, likely related to URI. Possible bacterial coinfection. Sputum culture, fungus and AFB ordered - pt to return. Educated on proper storage. Stop mucinex DM. Start mucinex 1200 mg Twice daily, flutter valve and hypertonic saline nebs Twice daily. Continue albuterol PRN. Tessalon perles for discomfort associate with coughing or at night. CXR today negative for acute process; similar, mild bronchitic changes. Pt agreed to prednisone 20 mg for 5 days. Advised to monitor sugars.   Patient Instructions  -Continue albuterol 1-2 puffs every 6 hours as needed for shortness of breath or wheezing -Continue claritin 10 mg daily  -Augmentin 875 Twice daily for 7 days. Notify immediately of any rash, itching, hives, or swelling, or seek emergency care.Finish your antibiotics in their entirety. Do not stop just because symptoms improve. Take with food to reduce GI upset. -Daily probiotic over the counter while on augmentin -Prednisone 20 mg for 5 days. Take in AM with food. Monitor blood sugar. -Hypertonic saline nebs Twice daily. We have sent an order to the medical equipment company for a nebulizer machine -Tessalon perles three times a day as needed for cough -Flonase nasal spray 1-2 sprays each nostril daily -Saline nasal spray 2-3 times a day as needed for nasal congestion/postnasal drip until symptoms improve -Mucinex 1200 mg twice daily until symptoms improve then you can decreased to 600 mg Twice daily   Continue flutter valve therapy  Chest x ray today. We will notify you of results  Sputum culture today.   We discussed how untreated sleep apnea puts an individual at risk for cardiac arrhthymias, pulm HTN, DM, stroke and increases their risk for daytime accidents. We also briefly reviewed treatment options including weight loss, side sleeping position, oral appliance, CPAP therapy or referral to ENT for possible surgical options -Referral to orthodontics for oral appliance  fitting.  -Side sleeping position encouraged.  -You were provided with contact information to ENT if you are interested in a surgical treatment option for your sleep apnea.   Follow up in 2 weeks with Dr. Elsworth Soho or Alanson Aly. If symptoms do not improve or worsen, please contact office for sooner follow up or seek emergency care.

## 2021-08-24 NOTE — Patient Instructions (Addendum)
-  Continue albuterol 1-2 puffs every 6 hours as needed for shortness of breath or wheezing -Continue claritin 10 mg daily  -Augmentin 875 Twice daily for 7 days. Notify immediately of any rash, itching, hives, or swelling, or seek emergency care.Finish your antibiotics in their entirety. Do not stop just because symptoms improve. Take with food to reduce GI upset. -Daily probiotic over the counter while on augmentin -Prednisone 20 mg for 5 days. Take in AM with food. Monitor blood sugar. -Hypertonic saline nebs Twice daily. We have sent an order to the medical equipment company for a nebulizer machine -Tessalon perles three times a day as needed for cough -Flonase nasal spray 1-2 sprays each nostril daily -Saline nasal spray 2-3 times a day as needed for nasal congestion/postnasal drip until symptoms improve -Mucinex 1200 mg twice daily until symptoms improve then you can decreased to 600 mg Twice daily   Continue flutter valve therapy  Chest x ray today. We will notify you of results  Sputum culture today.   We discussed how untreated sleep apnea puts an individual at risk for cardiac arrhthymias, pulm HTN, DM, stroke and increases their risk for daytime accidents. We also briefly reviewed treatment options including weight loss, side sleeping position, oral appliance, CPAP therapy or referral to ENT for possible surgical options -Referral to orthodontics for oral appliance fitting.  -Side sleeping position encouraged.  -You were provided with contact information to ENT if you are interested in a surgical treatment option for your sleep apnea.   Follow up in 2 weeks with Dr. Elsworth Soho or Alanson Aly. If symptoms do not improve or worsen, please contact office for sooner follow up or seek emergency care.

## 2021-08-24 NOTE — Progress Notes (Signed)
@Patient  ID: Tiffany [name], female    DOB: 08/29/1950, 70 y.o.   MRN: [id number]  Chief Complaint  Patient presents with   Follow-up    She reports that she is still having a productive cough that is green and thick. She has had PNA, and fatigue,.     Referring provider: Prince Solian, MD  HPI: 70 year old female, never smoker followed for bronchiectasis and OSA.  She is a patient of Dr. Bari Mantis and was last seen in office on 10/19/2020 by Parrett NP.  Past medical history significant for spina bifida, Niesen fundoplication in the 3875I, hypertension, diabetes, IBS, bisphosphonate infusion reaction in 2012  TEST/EVENTS:  01/2008 UGI series: Achalasia-like appearance of the distal esophagus, s/o stricture of the distal esophagus 12/2013: Spirometry showed no evidence of airway obstruction, normal lung function 06/2014 CT: Complete resolution of multifocal peribronchovascular micro nodularity noted on the prior study.  Small 3 mm left lower lobe lung nodule completely resolved.  Larger nodule in right lung much smaller. 11/27/2015 split-night study: Total AHI 7.4/h.  REM AHI 42.2 versus non-REM AHI 5.6.  Supine AHI 10.8.  SPO2 low 85%.  Mild obstructive sleep apnea. 08/15/2019 CXR: Stable changes with chronic bronchial thickening and pulmonary scarring 10/16/2019 PFTs: Normal lung function, no airway obstruction, DLCO 81%  10/19/2020: OV with Parrett NP.  No complaints or concerns.  Had bronchitis and 08/14/2020 and was able to resolve with Mucinex and flutter valve.  Her OSA is managed by her neurologist but she requested to transfer to [place] for management.  She has difficulties wearing CPAP machine and not has not worn it in several months.  Advised to restart CPAP at bedtime and wear all night long with nasal mask.  ProAir as needed.  Flutter valve as needed.  Follow-up in 6 months.  08/24/2021: Today -acute visit Patient presents today for reported increased shortness of breath and  productive cough.  She states that symptoms started 10 days ago and have progressively worsened since.  She also reports nasal congestion and yellow to green rhinorrhea.  Her cough is productive with green to yellow sputum and she has noticed an occasional wheeze.  She has had no issues with her oxygen has been maintaining above 90%.  She denies recent sick exposures or fevers.  She denies orthopnea, PND, chest pain, lower extremity swelling.  She started taking a doxycycline prescription that she had when her symptoms started but after finishing reported that her nasal congestion and sinus pressure worsened.  She previously contact the office and was advised to do prednisone but declined.  Home COVID test negative.  She has been taking Mucinex DM twice a day and frequent saline nasal spray with minimal relief in symptoms.  She has been using her albuterol inhaler 1-2 times a day.  She continues on Claritin 10 mg daily.  She has not been using her CPAP due to it making it feel like she's constantly sweating. She has a new machine andnew mask and reports it is not her humidifier malfunctioning. She has attempted to use a fan without relief. Overall she feels poorly.  Allergies  Allergen Reactions   Ambien [Zolpidem Tartrate] Other (See Comments)    Nightmares and insomnia   Aspirin Other (See Comments)    GI bleed   Benicar [Olmesartan Medoxomil] Shortness Of Breath, Swelling and Other (See Comments)    Swelling of throat   Codeine Nausea And Vomiting   Erythromycin Nausea And Vomiting   Ketorolac Anaphylaxis  Ketorolac Tromethamine Anaphylaxis   Latex Shortness Of Breath and Itching   Lisinopril Shortness Of Breath, Swelling and Other (See Comments)     Swelling of throat   Olmesartan Other (See Comments), Shortness Of Breath and Swelling    Swelling of throat   Other Anaphylaxis    Peaches and mangoes, tree nuts, tomatoes, shellfish, Cat dandor, green peas   Reclast [Zoledronic Acid] Other  (See Comments)    Ocular reaction, toxic   Toradol [Ketorolac Tromethamine] Anaphylaxis   Zolpidem Other (See Comments)    Nightmares and insomnia   Fenofibrate Nausea And Vomiting   Glucosamine Diarrhea and Nausea Only   Glucosamine Forte [Nutritional Supplements] Diarrhea and Nausea Only   Levaquin [Levofloxacin In D5w] Diarrhea and Swelling   Levofloxacin Diarrhea and Swelling   Percocet [Oxycodone-Acetaminophen] Nausea And Vomiting   Tramadol Nausea And Vomiting   Atorvastatin Other (See Comments)   Lomotil [Diphenoxylate-Atropine] Other (See Comments)    Hallucinations    Metformin Hcl Other (See Comments)   Tizanidine Hcl Other (See Comments)   Venlafaxine Other (See Comments)   Tizanidine Nausea And Vomiting    Immunization History  Administered Date(s) Administered   Fluad Quad(high Dose 65+) 05/13/2019   Influenza Split 10/15/2011, 11/24/2013, 12/17/2013, 07/06/2014   Influenza, Quadrivalent, Recombinant, Inj, Pf 07/07/2018, 06/15/2020, 05/08/2021, 07/12/2021   Influenza,inj,Quad PF,6+ Mos 07/05/2014, 05/15/2015, 05/14/2016   Influenza-Unspecified 05/15/2015, 06/12/2017   PFIZER Comirnaty(Gray Top)Covid-19 Tri-Sucrose Vaccine 12/29/2020   PFIZER(Purple Top)SARS-COV-2 Vaccination 10/02/2019, 10/27/2019, 04/24/2020, 12/29/2020   Pfizer Covid-19 Vaccine Bivalent Booster 79yrs & up 05/23/2021   Pneumococcal Conjugate-13 01/04/2015   Pneumococcal Polysaccharide-23 07/21/2012, 11/11/2013   Pneumococcal-Unspecified 11/11/2013   Td 02/24/2008, 10/13/2017    Past Medical History:  Diagnosis Date   Allergy    Anemia    pernicious anemia- recieved iron infusions until 10-2015   Anginal pain (HCC)    Anxiety    Arthritis    foot drop  lt foot  / toes   wears brace      Bronchiectasis (HCC)    Cancer (HCC)    SKIN     Cataract    REMOVED   Chronic cough    CKD (chronic kidney disease)    Colon polyps    Coronary artery calcification seen on CT scan 12/22/2017   CT in  2016   Depression    Diabetes mellitus    Dysphagia    Dysrhythmia    RAPID HEART BEAT , CHEST TIGHTNESS  09/29/14      Gastritis    GERD (gastroesophageal reflux disease)    Hiatal hernia    History of echocardiogram    Echo 5/19: Mild focal basal septal hypertrophy, EF 60-65, normal wall motion, grade 1 diastolic dysfunction, mildly dilated ascending aorta (38 mm), normal RVSF, mild TR, PASP 23   Hyperlipidemia    Hypertension    IBS (irritable bowel syndrome)    Insomnia    Kidney stones    KIDNEY DISEASE   STAGE lll     Kidney stones    Macular disorder    MACULAR DISEASE  RT EYE    Meningitis    Osteoporosis    Pneumonia    BRONCHIECTASIS   Shortness of breath dyspnea    WITH EXERTION   Sleep apnea    uses cpap   Spina bifida occulta    Ulcer     Tobacco History: Social History   Tobacco Use  Smoking Status Never  Smokeless Tobacco Never  Counseling given: Not Answered   Outpatient Medications Prior to Visit  Medication Sig Dispense Refill   acetaminophen (TYLENOL) 500 MG tablet Take 1,000 mg by mouth every 8 (eight) hours as needed for mild pain or headache.      albuterol (VENTOLIN HFA) 108 (90 Base) MCG/ACT inhaler Inhale 1-2 puffs into the lungs every 6 (six) hours as needed for wheezing or shortness of breath. 8 g 2   amLODipine (NORVASC) 2.5 MG tablet Take one tablet by mouth daily in addition to your Amlodipine 5mg  for a total of 7.5mg  once daily 90 tablet 3   amLODipine (NORVASC) 5 MG tablet Take one tablet by mouth daily along with an Amlodipine 2.5mg  tablet for a total of 7.5mg  90 tablet 3   atorvastatin (LIPITOR) 40 MG tablet Take 1 tablet (40 mg total) by mouth daily. 90 tablet 3   calcium carbonate (OS-CAL) 600 MG TABS tablet Take 600 mg by mouth 3 (three) times daily with meals.     Cholecalciferol (VITAMIN D PO) Take 5,000 Units by mouth daily.      diazepam (VALIUM) 10 MG tablet Take 10 mg by mouth at bedtime as needed for anxiety or sleep.       diphenhydrAMINE (BENADRYL) 25 mg capsule Take 25 mg by mouth every 6 (six) hours as needed for allergies.     DULoxetine (CYMBALTA) 30 MG capsule Take 60 mg by mouth daily.   6   EPIPEN 2-PAK 0.3 MG/0.3ML SOAJ injection Inject 0.3 mg into the muscle once.  0   furosemide (LASIX) 40 MG tablet Take 20 mg by mouth daily as needed for fluid or edema.      hydrALAZINE (APRESOLINE) 50 MG tablet TAKE 1 TABLET BY MOUTH 3  TIMES DAILY 270 tablet 3   loperamide (IMODIUM) 2 MG capsule Take 2 mg by mouth as needed for diarrhea or loose stools.     loratadine (CLARITIN) 10 MG tablet Take 10 mg by mouth daily as needed for allergies.      metFORMIN (GLUCOPHAGE-XR) 500 MG 24 hr tablet TK 1 T PO BID     methocarbamol (ROBAXIN) 750 MG tablet Take 1 tablet (750 mg total) by mouth every 8 (eight) hours as needed for muscle spasms. 60 tablet 1   metoprolol succinate (TOPROL-XL) 50 MG 24 hr tablet Take 1 tablet (50 mg total) by mouth 2 (two) times daily. Take with or immediately following a meal. (Patient taking differently: Take 75 mg by mouth in the morning, at noon, and at bedtime. Take with or immediately following a meal.) 180 tablet 3   Polyethyl Glycol-Propyl Glycol 0.4-0.3 % SOLN Place 2 drops into both eyes 2 (two) times daily.      promethazine (PHENERGAN) 25 MG tablet Take 25 mg by mouth every 6 (six) hours as needed for nausea or vomiting.      traMADol (ULTRAM) 50 MG tablet Take 1-2 tablets (50-100 mg total) by mouth every 6 (six) hours as needed. 40 tablet 0   Turmeric (QC TUMERIC COMPLEX PO) Take by mouth.     albuterol (VENTOLIN HFA) 108 (90 Base) MCG/ACT inhaler INL 2 PFS PO Q 4 H PRF WHZ OR SOB     No facility-administered medications prior to visit.     Review of Systems:   Constitutional: No weight loss or gain, night sweats, fevers, chills. +fatigue HEENT: No headaches, difficulty swallowing, tooth/dental problems, or sore throat. No sneezing, itching, ear ache. +nasal congestion, sinus  pressure/pain, green/yellow rhinorrhea CV:  No chest pain, orthopnea, PND, swelling in lower extremities, anasarca, dizziness, palpitations, syncope Resp: +shortness of breath with exertion; productive cough with yellow/green sputum; wheeze.  No hemoptysis. No chest wall deformity GI:  No heartburn, indigestion, abdominal pain, nausea, vomiting, diarrhea, change in bowel habits, loss of appetite, bloody stools.  GU: No dysuria, change in color of urine, urgency or frequency.  No flank pain, no hematuria  Skin: No rash, lesions, ulcerations MSK:  No joint pain or swelling.  No decreased range of motion.  No back pain. Neuro: No dizziness or lightheadedness.  Psych: No depression or anxiety. Mood stable.     Physical Exam:  BP 118/78 (BP Location: Left Arm, Patient Position: Sitting, Cuff Size: Normal)    Pulse 74    Temp 98.2 F (36.8 C) (Oral)    Ht 5\' 3"  (1.6 m)    Wt 174 lb (78.9 kg)    SpO2 99%    BMI 30.82 kg/m   GEN: Pleasant, interactive, well-nourished; in no acute distress HEENT:  Normocephalic and atraumatic. EACs patent bilaterally. TM pearly gray with present light reflex bilaterally. PERRLA. Sclera white. Nasal turbinates pink, moist and patent bilaterally. White rhinorrhea present. Oropharynx erythematous and moist, without exudate or edema. No lesions, ulcerations. NECK:  Supple w/ fair ROM. No JVD present. Normal carotid impulses w/o bruits. Thyroid symmetrical with no goiter or nodules palpated. No lymphadenopathy.   CV: RRR, no m/r/g, no peripheral edema. Pulses intact, +2 bilaterally. No cyanosis, pallor or clubbing. PULMONARY:  Unlabored, regular breathing. Scattered rhonchi bilaterally A&P w/o wheezes. No accessory muscle use. No dullness to percussion. GI: BS present and normoactive. Soft, non-tender to palpation. No organomegaly or masses detected. No CVA tenderness. MSK: No erythema, warmth or tenderness. Cap refil <2 sec all extrem. No deformities or joint swelling  noted.  Neuro: A/Ox3. No focal deficits noted.   Skin: Warm, no lesions or rashe Psych: Normal affect and behavior. Judgement and thought content appropriate.     Lab Results:  CBC    Component Value Date/Time   WBC 7.3 11/27/2020 0952   WBC 7.2 05/01/2017 1200   RBC 3.78 11/27/2020 0952   RBC 3.82 (L) 05/01/2017 1200   HGB 11.3 11/27/2020 0952   HCT 34.3 11/27/2020 0952   PLT 240 11/27/2020 0952   MCV 91 11/27/2020 0952   MCH 29.9 11/27/2020 0952   MCH 30.6 08/19/2016 1820   MCHC 32.9 11/27/2020 0952   MCHC 33.7 05/01/2017 1200   RDW 12.3 11/27/2020 0952   LYMPHSABS 2.0 05/01/2017 1200   MONOABS 0.5 05/01/2017 1200   EOSABS 0.2 05/01/2017 1200   BASOSABS 0.1 05/01/2017 1200    BMET    Component Value Date/Time   NA 144 11/27/2020 0952   K 5.2 11/27/2020 0952   CL 105 11/27/2020 0952   CO2 22 11/27/2020 0952   GLUCOSE 137 (H) 11/27/2020 0952   GLUCOSE 124 (H) 05/01/2017 1200   BUN 25 11/27/2020 0952   CREATININE 1.60 (H) 11/27/2020 0952   CALCIUM 9.6 11/27/2020 0952   GFRNONAA 40 (L) 07/26/2019 1444   GFRAA 46 (L) 07/26/2019 1444    BNP No results found for: BNP   Imaging:  08/24/2021: CXR reviewed by me. Unchanged, mild bronchitic changes. No evidence of pneumonia  DG Chest 2 View  Result Date: 08/24/2021 CLINICAL DATA:  Productive, increased cough. EXAM: CHEST - 2 VIEW COMPARISON:  08/10/2019 FINDINGS: Heart size is normal. There is persistent perihilar peribronchial thickening, similar in appearance to prior  study. No new consolidations or evidence for pulmonary edema. Degenerative changes seen in the UPPER lumbar spine. IMPRESSION: Similar, mild bronchitic changes. Electronically Signed   By: Nolon Nations M.D.   On: 08/24/2021 12:57   XR HIP UNILAT W OR W/O PELVIS 1V RIGHT  Result Date: 08/06/2021 An AP pelvis and lateral of the right hip shows no acute findings.     PFT Results Latest Ref Rng & Units 10/18/2019  FVC-Pre L 2.70   FVC-Predicted Pre % 87  FVC-Post L 2.55  FVC-Predicted Post % 82  Pre FEV1/FVC % % 84  Post FEV1/FCV % % 88  FEV1-Pre L 2.25  FEV1-Predicted Pre % 95  FEV1-Post L 2.26  DLCO uncorrected ml/min/mmHg 16.13  DLCO UNC% % 81  DLVA Predicted % 95  TLC L 4.55  TLC % Predicted % 90  RV % Predicted % 74    No results found for: NITRICOXIDE      Assessment & Plan:   Bronchiectasis with acute exacerbation (HCC) Bronchiectasis flare, likely related to URI. Possible bacterial coinfection. Sputum culture, fungus and AFB ordered - pt to return. Educated on proper storage. Stop mucinex DM. Start mucinex 1200 mg Twice daily, flutter valve and hypertonic saline nebs Twice daily. Continue albuterol PRN. Tessalon perles for discomfort associate with coughing or at night. CXR today negative for acute process; similar, mild bronchitic changes. Pt agreed to prednisone 20 mg for 5 days. Advised to monitor sugars.   Patient Instructions  -Continue albuterol 1-2 puffs every 6 hours as needed for shortness of breath or wheezing -Continue claritin 10 mg daily  -Augmentin 875 Twice daily for 7 days. Notify immediately of any rash, itching, hives, or swelling, or seek emergency care.Finish your antibiotics in their entirety. Do not stop just because symptoms improve. Take with food to reduce GI upset. -Daily probiotic over the counter while on augmentin -Prednisone 20 mg for 5 days. Take in AM with food. Monitor blood sugar. -Hypertonic saline nebs Twice daily. We have sent an order to the medical equipment company for a nebulizer machine -Tessalon perles three times a day as needed for cough -Flonase nasal spray 1-2 sprays each nostril daily -Saline nasal spray 2-3 times a day as needed for nasal congestion/postnasal drip until symptoms improve -Mucinex 1200 mg twice daily until symptoms improve then you can decreased to 600 mg Twice daily   Continue flutter valve therapy  Chest x ray today. We will  notify you of results  Sputum culture today.   We discussed how untreated sleep apnea puts an individual at risk for cardiac arrhthymias, pulm HTN, DM, stroke and increases their risk for daytime accidents. We also briefly reviewed treatment options including weight loss, side sleeping position, oral appliance, CPAP therapy or referral to ENT for possible surgical options -Referral to orthodontics for oral appliance fitting.  -Side sleeping position encouraged.  -You were provided with contact information to ENT if you are interested in a surgical treatment option for your sleep apnea.   Follow up in 2 weeks with Dr. Elsworth Soho or Alanson Aly. If symptoms do not improve or worsen, please contact office for sooner follow up or seek emergency care.    Acute bacterial rhinosinusitis Previously treated with doxy with slight improvement but worsened after stopping. Will treat with augmentin 875 Twice daily for 7 days. Flonase nasal spray daily. Supportive care. See above.  OSA on CPAP Noncompliant with therapy due to the mask making her constantly sweat at night. Mild OSA. Referral  to orthodontics for oral appliance fitting. Also provided with contact to ENT for possible surgical intervention if desired.Discussed risk of untreated OSA. See above.      Clayton Bibles, NP 08/24/2021  Pt aware and understands NP's role.

## 2021-08-31 ENCOUNTER — Telehealth: Payer: Self-pay | Admitting: Nurse Practitioner

## 2021-08-31 DIAGNOSIS — J471 Bronchiectasis with (acute) exacerbation: Secondary | ICD-10-CM

## 2021-09-01 ENCOUNTER — Encounter (HOSPITAL_COMMUNITY): Payer: Self-pay

## 2021-09-01 ENCOUNTER — Other Ambulatory Visit: Payer: Self-pay

## 2021-09-01 ENCOUNTER — Emergency Department (HOSPITAL_COMMUNITY)
Admission: EM | Admit: 2021-09-01 | Discharge: 2021-09-01 | Disposition: A | Discharge status: Home / Self Care | Payer: Medicare Other | Attending: Emergency Medicine | Admitting: Emergency Medicine

## 2021-09-01 ENCOUNTER — Emergency Department (HOSPITAL_COMMUNITY): Payer: Medicare Other

## 2021-09-01 DIAGNOSIS — F410 Panic disorder [episodic paroxysmal anxiety] without agoraphobia: Secondary | ICD-10-CM

## 2021-09-01 DIAGNOSIS — R0602 Shortness of breath: Secondary | ICD-10-CM | POA: Diagnosis not present

## 2021-09-01 DIAGNOSIS — N179 Acute kidney failure, unspecified: Secondary | ICD-10-CM

## 2021-09-01 DIAGNOSIS — R221 Localized swelling, mass and lump, neck: Secondary | ICD-10-CM | POA: Diagnosis not present

## 2021-09-01 DIAGNOSIS — Z91040 Latex allergy status: Secondary | ICD-10-CM | POA: Insufficient documentation

## 2021-09-01 DIAGNOSIS — T7840XA Allergy, unspecified, initial encounter: Secondary | ICD-10-CM | POA: Insufficient documentation

## 2021-09-01 LAB — CBC WITH DIFFERENTIAL/PLATELET
Abs Immature Granulocytes: 0.12 10*3/uL — ABNORMAL HIGH (ref 0.00–0.07)
Basophils Absolute: 0.1 10*3/uL (ref 0.0–0.1)
Basophils Relative: 1 %
Eosinophils Absolute: 0.1 10*3/uL (ref 0.0–0.5)
Eosinophils Relative: 1 %
HCT: 32.7 % — ABNORMAL LOW (ref 36.0–46.0)
Hemoglobin: 10.2 g/dL — ABNORMAL LOW (ref 12.0–15.0)
Immature Granulocytes: 1 %
Lymphocytes Relative: 11 %
Lymphs Abs: 1.3 10*3/uL (ref 0.7–4.0)
MCH: 30 pg (ref 26.0–34.0)
MCHC: 31.2 g/dL (ref 30.0–36.0)
MCV: 96.2 fL (ref 80.0–100.0)
Monocytes Absolute: 0.6 10*3/uL (ref 0.1–1.0)
Monocytes Relative: 5 %
Neutro Abs: 9 10*3/uL — ABNORMAL HIGH (ref 1.7–7.7)
Neutrophils Relative %: 81 %
Platelets: 188 10*3/uL (ref 150–400)
RBC: 3.4 MIL/uL — ABNORMAL LOW (ref 3.87–5.11)
RDW: 12.9 % (ref 11.5–15.5)
WBC: 11.1 10*3/uL — ABNORMAL HIGH (ref 4.0–10.5)
nRBC: 0 % (ref 0.0–0.2)

## 2021-09-01 LAB — COMPREHENSIVE METABOLIC PANEL
ALT: 15 U/L (ref 0–44)
AST: 17 U/L (ref 15–41)
Albumin: 3.5 g/dL (ref 3.5–5.0)
Alkaline Phosphatase: 71 U/L (ref 38–126)
Anion gap: 13 (ref 5–15)
BUN: 30 mg/dL — ABNORMAL HIGH (ref 8–23)
CO2: 22 mmol/L (ref 22–32)
Calcium: 8.3 mg/dL — ABNORMAL LOW (ref 8.9–10.3)
Chloride: 104 mmol/L (ref 98–111)
Creatinine, Ser: 2.29 mg/dL — ABNORMAL HIGH (ref 0.44–1.00)
GFR, Estimated: 23 mL/min — ABNORMAL LOW (ref >60)
Glucose, Bld: 225 mg/dL — ABNORMAL HIGH (ref 70–99)
Potassium: 3.7 mmol/L (ref 3.5–5.1)
Sodium: 139 mmol/L (ref 135–145)
Total Bilirubin: 0.4 mg/dL (ref 0.3–1.2)
Total Protein: 6.2 g/dL — ABNORMAL LOW (ref 6.5–8.1)

## 2021-09-01 MED ORDER — FAMOTIDINE 20 MG PO TABS
20.0000 mg | ORAL_TABLET | Freq: Once | ORAL | Status: AC
  Administered 2021-09-01: 20 mg via ORAL
  Filled 2021-09-01: qty 1

## 2021-09-01 MED ORDER — SODIUM CHLORIDE 0.9 % IV BOLUS
1000.0000 mL | Freq: Once | INTRAVENOUS | Status: AC
  Administered 2021-09-01: 1000 mL via INTRAVENOUS

## 2021-09-01 MED ORDER — LIDOCAINE VISCOUS HCL 2 % MT SOLN
15.0000 mL | Freq: Once | OROMUCOSAL | Status: AC
  Administered 2021-09-01: 15 mL via ORAL
  Filled 2021-09-01: qty 15

## 2021-09-01 MED ORDER — METHYLPREDNISOLONE SODIUM SUCC 125 MG IJ SOLR: 125.0000 mg | Freq: Once | INTRAMUSCULAR | Status: DC

## 2021-09-01 MED ORDER — ALUM & MAG HYDROXIDE-SIMETH 200-200-20 MG/5ML PO SUSP
30.0000 mL | Freq: Once | ORAL | Status: AC
  Administered 2021-09-01: 30 mL via ORAL
  Filled 2021-09-01: qty 30

## 2021-09-01 MED ORDER — LORAZEPAM 2 MG/ML IJ SOLN
1.0000 mg | Freq: Once | INTRAMUSCULAR | Status: AC
Start: 2021-09-01 — End: 2021-09-01
  Administered 2021-09-01: 1 mg via INTRAVENOUS
  Filled 2021-09-01: qty 1

## 2021-09-01 NOTE — Discharge Instructions (Signed)
Your kidney function is slightly abnormal from slight dehydration.  Repeat kidney function in a week with your doctor   Stay hydrated  Return to ER if you have trouble breathing, trouble swallowing, shortness of breath, throat closing, rash

## 2021-09-01 NOTE — ED Triage Notes (Signed)
Pt states her and her husband came back from store and she felt like she was having an allergic reaction of unknown origin, states her throat felt tight, she took 50 mg of benedryl and epi pen prior to ems arrival. No complaints of sob while in care of ems

## 2021-09-01 NOTE — ED Provider Notes (Signed)
[facility] Provider Note   CSN: [id number] Arrival date & time: 09/01/21  1935     History  Chief Complaint  Patient presents with   Allergic Reaction    Tiffany [name] is a 71 y.o. female history of achalasia, bronchiectasis, here presenting with concern for possible allergic reaction.  Patient states that this evening she felt that her throat was tight.  Her husband went to the grocery store and came back and she states that her throat felt like it was closing.  Patient gave herself 50 mg of Benadryl and EpiPen prior to EMS arrival.  EMS was called.  Patient states that she felt like she has a panic attack.  Patient denies anything stuck in her throat.  Patient has some subjective shortness of breath.  Patient denies any rash or new medicines.  Patient is on Keflex and steroids for her bronchiectasis as prescribed by her pulmonary doctor.  However she states that she had these medicines before with no reaction.  She states that she has been under a lot of stress during the holidays.  She is concerned that she may have a panic attack  The history is provided by the patient.      Home Medications Prior to Admission medications   Medication Sig Start Date End Date Taking? Authorizing Provider  acetaminophen (TYLENOL) 500 MG tablet Take 1,000 mg by mouth every 8 (eight) hours as needed for mild pain or headache.     [provider]  albuterol (VENTOLIN HFA) 108 (90 Base) MCG/ACT inhaler Inhale 1-2 puffs into the lungs every 6 (six) hours as needed for wheezing or shortness of breath. 10/19/20   Parrett, Fonnie Mu, NP  amLODipine (NORVASC) 2.5 MG tablet Take one tablet by mouth daily in addition to your Amlodipine 5mg  for a total of 7.5mg  once daily 12/04/20   Fay Records, MD  amLODipine (NORVASC) 5 MG tablet Take one tablet by mouth daily along with an Amlodipine 2.5mg  tablet for a total of 7.5mg  12/04/20   Fay Records, MD   amoxicillin-clavulanate (AUGMENTIN) 875-125 MG tablet Take 1 tablet by mouth 2 (two) times daily. 08/24/21   Cobb, Karie Schwalbe, NP  atorvastatin (LIPITOR) 40 MG tablet Take 1 tablet (40 mg total) by mouth daily. 12/22/15   Fay Records, MD  benzonatate (TESSALON) 200 MG capsule Take 1 capsule (200 mg total) by mouth 3 (three) times daily as needed for cough. 08/24/21   Cobb, Karie Schwalbe, NP  calcium carbonate (OS-CAL) 600 MG TABS tablet Take 600 mg by mouth 3 (three) times daily with meals.    [provider]  Cholecalciferol (VITAMIN D PO) Take 5,000 Units by mouth daily.     [provider]  diazepam (VALIUM) 10 MG tablet Take 10 mg by mouth at bedtime as needed for anxiety or sleep.     [provider]  diphenhydrAMINE (BENADRYL) 25 mg capsule Take 25 mg by mouth every 6 (six) hours as needed for allergies.    [provider]  DULoxetine (CYMBALTA) 30 MG capsule Take 60 mg by mouth daily.  12/18/17   [provider]  EPIPEN 2-PAK 0.3 MG/0.3ML SOAJ injection Inject 0.3 mg into the muscle once. 08/02/14   [provider]  fluticasone (FLONASE) 50 MCG/ACT nasal spray Place 1 spray into both nostrils daily. 08/24/21   Cobb, Karie Schwalbe, NP  furosemide (LASIX) 40 MG tablet Take 20 mg by mouth daily as needed for  fluid or edema.     [provider]  guaiFENesin (MUCINEX) 600 MG 12 hr tablet Take 2 tablets (1,200 mg total) by mouth 2 (two) times daily. 08/24/21   Cobb, Karie Schwalbe, NP  hydrALAZINE (APRESOLINE) 50 MG tablet TAKE 1 TABLET BY MOUTH 3  TIMES DAILY 01/05/21   Fay Records, MD  loperamide (IMODIUM) 2 MG capsule Take 2 mg by mouth as needed for diarrhea or loose stools.    [provider]  loratadine (CLARITIN) 10 MG tablet Take 10 mg by mouth daily as needed for allergies.     [provider]  metFORMIN (GLUCOPHAGE-XR) 500 MG 24 hr tablet TK 1 T PO BID 04/23/19   [provider]  methocarbamol (ROBAXIN)  750 MG tablet Take 1 tablet (750 mg total) by mouth every 8 (eight) hours as needed for muscle spasms. 08/06/21   Mcarthur Rossetti, MD  metoprolol succinate (TOPROL-XL) 50 MG 24 hr tablet Take 1 tablet (50 mg total) by mouth 2 (two) times daily. Take with or immediately following a meal. Patient taking differently: Take 75 mg by mouth in the morning, at noon, and at bedtime. Take with or immediately following a meal. 08/07/18   Fay Records, MD  Polyethyl Glycol-Propyl Glycol 0.4-0.3 % SOLN Place 2 drops into both eyes 2 (two) times daily.     [provider]  predniSONE (DELTASONE) 20 MG tablet Take 1 tablet (20 mg total) by mouth daily with breakfast. 08/24/21   Cobb, Karie Schwalbe, NP  promethazine (PHENERGAN) 25 MG tablet Take 25 mg by mouth every 6 (six) hours as needed for nausea or vomiting.     [provider]  sodium chloride HYPERTONIC 3 % nebulizer solution Take by nebulization 2 (two) times daily. 08/24/21   Cobb, Karie Schwalbe, NP  traMADol (ULTRAM) 50 MG tablet Take 1-2 tablets (50-100 mg total) by mouth every 6 (six) hours as needed. 08/06/21   Mcarthur Rossetti, MD  Turmeric (QC TUMERIC COMPLEX PO) Take by mouth.    [provider]      Allergies    Ambien [zolpidem tartrate], Aspirin, Benicar [olmesartan medoxomil], Codeine, Erythromycin, Ketorolac, Ketorolac tromethamine, Latex, Lisinopril, Olmesartan, Other, Reclast [zoledronic acid], Toradol [ketorolac tromethamine], Zolpidem, Fenofibrate, Glucosamine, Glucosamine forte [nutritional supplements], Levaquin [levofloxacin in d5w], Levofloxacin, Percocet [oxycodone-acetaminophen], Tramadol, Atorvastatin, Lomotil [diphenoxylate-atropine], Metformin hcl, Tizanidine hcl, Venlafaxine, and Tizanidine    Review of Systems   Review of Systems  Respiratory:  Positive for shortness of breath.   Cardiovascular:  Positive for palpitations.  All other systems reviewed and are negative.  Physical  Exam Updated Vital Signs BP 131/63    Pulse 75    Temp 98.6 F (37 C) (Oral)    Resp (!) 22    Ht 5\' 3"  (1.6 m)    Wt 77.1 kg    SpO2 95%    BMI 30.11 kg/m  Physical Exam Vitals and nursing note reviewed.  Constitutional:      Appearance: Normal appearance.  HENT:     Head: Normocephalic.     Nose: Nose normal.     Mouth/Throat:     Mouth: Mucous membranes are moist.     Comments: No foreign body in the posterior pharynx and posterior pharynx is clear and uvula midline  Eyes:     Extraocular Movements: Extraocular movements intact.     Pupils: Pupils are equal, round, and reactive to light.  Cardiovascular:     Rate and Rhythm: Normal rate  and regular rhythm.     Pulses: Normal pulses.     Heart sounds: Normal heart sounds.  Pulmonary:     Effort: Pulmonary effort is normal.     Breath sounds: Normal breath sounds.  Abdominal:     General: Abdomen is flat.     Palpations: Abdomen is soft.  Musculoskeletal:        General: Normal range of motion.     Cervical back: Normal range of motion and neck supple.  Skin:    General: Skin is warm.     Capillary Refill: Capillary refill takes less than 2 seconds.     Comments: No obvious rash or urticaria  Neurological:     General: No focal deficit present.     Mental Status: She is alert and oriented to person, place, and time.  Psychiatric:        Mood and Affect: Mood normal.        Behavior: Behavior normal.    ED Results / Procedures / Treatments   Labs (all labs ordered are listed, but only abnormal results are displayed) Labs Reviewed  CBC WITH DIFFERENTIAL/PLATELET - Abnormal; Notable for the following components:      Result Value   WBC 11.1 (*)    RBC 3.40 (*)    Hemoglobin 10.2 (*)    HCT 32.7 (*)    Neutro Abs 9.0 (*)    Abs Immature Granulocytes 0.12 (*)    All other components within normal limits  COMPREHENSIVE METABOLIC PANEL - Abnormal; Notable for the following components:   Glucose, Bld 225 (*)    BUN  30 (*)    Creatinine, Ser 2.29 (*)    Calcium 8.3 (*)    Total Protein 6.2 (*)    GFR, Estimated 23 (*)    All other components within normal limits    EKG EKG Interpretation  Date/Time:  Saturday September 01 2021 19:44:57 EST Ventricular Rate:  84 PR Interval:  128 QRS Duration: 97 QT Interval:  397 QTC Calculation: 470 R Axis:   40 Text Interpretation: Sinus rhythm Probable left atrial enlargement Borderline T abnormalities, anterior leads No significant change since last tracing Confirmed by Wandra Arthurs ([id number]) on 09/01/2021 7:48:24 PM  Radiology DG Chest Port 1 View  Result Date: 09/01/2021 CLINICAL DATA:  Short sub breath. EXAM: PORTABLE CHEST 1 VIEW COMPARISON:  Chest radiograph dated 08/24/2021. FINDINGS: Minimal bibasilar atelectasis. No focal consolidation, pleural effusion, pneumothorax. The cardiac silhouette is within normal limits. No acute osseous pathology. IMPRESSION: No active disease. Electronically Signed   By: Anner Crete M.D.   On: 09/01/2021 20:13    Procedures Procedures    Medications Ordered in ED Medications  sodium chloride 0.9 % bolus 1,000 mL (1,000 mLs Intravenous New Bag/Given 09/01/21 2004)  LORazepam (ATIVAN) injection 1 mg (1 mg Intravenous Given 09/01/21 2003)  alum & mag hydroxide-simeth (MAALOX/MYLANTA) 200-200-20 MG/5ML suspension 30 mL (30 mLs Oral Given 09/01/21 2003)    And  lidocaine (XYLOCAINE) 2 % viscous mouth solution 15 mL (15 mLs Oral Given 09/01/21 2003)  famotidine (PEPCID) tablet 20 mg (20 mg Oral Given 09/01/21 2003)    ED Course/ Medical Decision Making/ A&P                           Medical Decision Making Tiffany [name] is a 71 y.o. female here presenting with subjective throat swelling. Patient is not in any respiratory distress.  Patient is under a lot of stress.  I suspect some anxiety.  Patient does not feel anything was stuck in her throat.  She does have a history of bronchiectasis and is on steroids and  antibiotics currently.  We will check basic labs and chest x-ray.  10:34 PM Patient was observed for 3 hours after epinephrine.  She states that her throat felt better. Creatinine increased to 2.2.  Chest x-ray is clear.  I recommend she stay hydrated and repeat kidney function with her doctor.  Stable for discharge     Amount and/or Complexity of Data Reviewed Independent Historian: parent External Data Reviewed: labs and radiology. Labs: ordered. Radiology: ordered and independent interpretation performed.  Final Clinical Impression(s) / ED Diagnoses Final diagnoses:  None    Rx / DC Orders ED Discharge Orders     None         Drenda Freeze, MD 09/01/21 2235

## 2021-09-03 ENCOUNTER — Ambulatory Visit: Payer: Medicare Other | Admitting: Orthopaedic Surgery

## 2021-09-03 NOTE — Telephone Encounter (Signed)
Called the pt's spouse and there was no answer- LMTCB.  

## 2021-09-04 MED ORDER — SODIUM CHLORIDE 3 % IN NEBU: INHALATION_SOLUTION | Freq: Two times a day (BID) | RESPIRATORY_TRACT | 3 refills | Status: DC

## 2021-09-04 NOTE — Telephone Encounter (Signed)
Rx for pt's sodium chloride neb sol has been sent to [facility] at pt's request. Called and spoke with pt letting her know this had been done and she verbalized understanding. Nothing further needed.

## 2021-09-07 ENCOUNTER — Other Ambulatory Visit: Payer: Self-pay

## 2021-09-07 ENCOUNTER — Encounter: Payer: Self-pay | Admitting: Nurse Practitioner

## 2021-09-07 ENCOUNTER — Ambulatory Visit: Payer: Medicare Other | Admitting: Nurse Practitioner

## 2021-09-07 VITALS — BP 138/74 | HR 84 | Temp 98.2°F | Ht 63.0 in | Wt 179.4 lb

## 2021-09-07 DIAGNOSIS — G4733 Obstructive sleep apnea (adult) (pediatric): Secondary | ICD-10-CM | POA: Diagnosis not present

## 2021-09-07 DIAGNOSIS — J302 Other seasonal allergic rhinitis: Secondary | ICD-10-CM | POA: Diagnosis not present

## 2021-09-07 DIAGNOSIS — J479 Bronchiectasis, uncomplicated: Secondary | ICD-10-CM | POA: Diagnosis not present

## 2021-09-07 NOTE — Assessment & Plan Note (Addendum)
Improved. Minimal SOB, only with strenuous activity (baseline), and cough resolved. Decreased mucinex to 600 mg Twice daily. Can transition to PRN if symptoms remain stable. Continue flutter valve PRN. Previously completed prednisone and augmentin with improvement in symptoms. Never able to collect sputum sample. Continue PRN albuterol.   Patient Instructions  -Continue albuterol 1-2 puffs every 6 hours as needed for shortness of breath or wheezing -Continue claritin 10 mg daily -Continue mucinex 600 mg Twice daily  -Continue activity as tolerated -Continue flutter valve therapy  We discussed how untreated sleep apnea puts an individual at risk for cardiac arrhthymias, pulm HTN, DM, stroke and increases their risk for daytime accidents. We also briefly reviewed treatment options including weight loss, side sleeping position, oral appliance, CPAP therapy or referral to ENT for possible surgical options -Referral to orthodontics for oral appliance fitting previously placed. Please follow up with them for your fitting.  -Side sleeping position encouraged.  -You were provided with contact information to ENT if you are interested in a surgical treatment option for your sleep apnea.    Follow up in 3 months with Dr. Elsworth Soho. If symptoms do not improve or worsen, please contact office for sooner follow up or seek emergency care.

## 2021-09-07 NOTE — Progress Notes (Signed)
@Patient  ID: Tiffany [name], female    DOB: 01/16/1951, 71 y.o.   MRN: [id number]  Chief Complaint  Patient presents with   Follow-up    2 week follow up. Pt says she feels better but not great. Did have a reaction to something and ended up in the hospital. Have not received the hypertonic nebs yet.     Referring provider: Prince Solian, MD  HPI: 71 year old female, never smoker followed for bronchiectasis and OSA. She is a patient of Dr. Bari Mantis and was last seen in office on 08/24/2021 by [facility] NP. Past medical history significant for spina bifida, niesen fundoplication in the 4627'O, HTN, DM, IBS, bisphosphonate infusion reaction in 2012.   TEST/EVENTS:  01/2008 UGI series: achalasia-like appearance of the distal esophagus, s/o stricutre of the distal esophagus 12/2013 spirometry: no evidence of airway obstruction, normal lung function 06/2014 CT chest: complete resolution of multifocal preibronchovascular micro nodularity noted on prior study. Small 33mm LLL lung nodule completely resolved. Larger nodule in right lung much smaller 11/27/2015 split night study: total AHI 7.4/hr, SpO2 low 85%. Mild OSA. 08/15/2019 CXR: stable changes with chronic bronchial thickening and pulmonary scarring 10/16/2019 PFTs: normal lung function, no airway obstruction, DLCO 81%  10/19/2020: OV with [facility] NP. No complaints or concerns. Difficulties with CPAP machine - previously managed by neurologist and requested to switch to [place]. She was advised to restart CPAP nightly with change to nasal mask. ProAir PRN with flutter PRN. F/u 6 months.   08/24/2021: OV with Sinaya Minogue, NP. Increased SOB and productive cough - tx for bronchiectasis flare r/t URI. COVID/flu negative. CXR with chronic bronchitic changes, unchanged; without acute process. Sputum culture ordered - pt never able to collect. Mucinex 1200 mg Twice daily, flutter valve, tessalon perles. Prednisone 20 mg for 5 days and augmentin 7 days. Hypertonic  saline nebs ordered. Still not tolerating CPAP therapy; given mild severity, referral sent to orthodontics for oral appliance fitting, side lying sleeping position. Info to ENT provided if surgical intervention desired - pt yet to decide.   09/07/2021: Today - follow up Patient presents today for follow up after being treated for bronchiectasis flare 2 weeks ago. She did have a ED visit on 1/7 for reported anaphylactic reaction; unsure what the reaction was from and was never able to ID. She self administered epi pen at home and was stable in ED - dx with panic attack per note and discharged home w/o intervention. Today, she reports feeling much better and that her cough is resolved. She occasionally experiences SOB but only with strenuous activity. She denies fevers, chills, wheezing, orthopnea, PND, lower extremity swelling or chest pain. She has not required her albuterol inhaler. She continues on 1200 mg mucinex Twice daily and flutter valve therapy. She was never able to get her hypertonic saline nebs d/t them not being in stock. She continues on claritin daily and flonase PRN. She has not seen orthodontics for oral appliance fitting yet, but they did call her and she plans to call them back this week. She denies daytime fatigue symptoms, morning headaches, drowsy driving or narcolepsy. Overall, she feels well and offers no further complaints.   Allergies  Allergen Reactions   Ambien [Zolpidem Tartrate] Other (See Comments)    Nightmares and insomnia   Aspirin Other (See Comments)    GI bleed   Benicar [Olmesartan Medoxomil] Shortness Of Breath, Swelling and Other (See Comments)    Swelling of throat   Codeine Nausea And Vomiting  Erythromycin Nausea And Vomiting   Ketorolac Anaphylaxis   Ketorolac Tromethamine Anaphylaxis   Latex Shortness Of Breath and Itching   Lisinopril Shortness Of Breath, Swelling and Other (See Comments)     Swelling of throat   Olmesartan Other (See Comments),  Shortness Of Breath and Swelling    Swelling of throat   Other Anaphylaxis    Peaches and mangoes, tree nuts, tomatoes, shellfish, Cat dandor, green peas   Reclast [Zoledronic Acid] Other (See Comments)    Ocular reaction, toxic   Toradol [Ketorolac Tromethamine] Anaphylaxis   Zolpidem Other (See Comments)    Nightmares and insomnia   Fenofibrate Nausea And Vomiting   Glucosamine Diarrhea and Nausea Only   Glucosamine Forte [Nutritional Supplements] Diarrhea and Nausea Only   Levaquin [Levofloxacin In D5w] Diarrhea and Swelling   Levofloxacin Diarrhea and Swelling   Percocet [Oxycodone-Acetaminophen] Nausea And Vomiting   Tramadol Nausea And Vomiting   Atorvastatin Other (See Comments)   Lomotil [Diphenoxylate-Atropine] Other (See Comments)    Hallucinations    Metformin Hcl Other (See Comments)   Tizanidine Hcl Other (See Comments)   Venlafaxine Other (See Comments)   Tizanidine Nausea And Vomiting    Immunization History  Administered Date(s) Administered   Fluad Quad(high Dose 65+) 05/13/2019   Influenza Split 10/15/2011, 11/24/2013, 12/17/2013, 07/06/2014   Influenza, Quadrivalent, Recombinant, Inj, Pf 07/07/2018, 06/15/2020, 05/08/2021, 07/12/2021   Influenza,inj,Quad PF,6+ Mos 07/05/2014, 05/15/2015, 05/14/2016   Influenza-Unspecified 05/15/2015, 06/12/2017   PFIZER Comirnaty(Gray Top)Covid-19 Tri-Sucrose Vaccine 12/29/2020   PFIZER(Purple Top)SARS-COV-2 Vaccination 10/02/2019, 10/27/2019, 04/24/2020, 12/29/2020   Pfizer Covid-19 Vaccine Bivalent Booster 72yrs & up 05/23/2021   Pneumococcal Conjugate-13 01/04/2015   Pneumococcal Polysaccharide-23 07/21/2012, 11/11/2013   Pneumococcal-Unspecified 11/11/2013   Td 02/24/2008, 10/13/2017    Past Medical History:  Diagnosis Date   Allergy    Anemia    pernicious anemia- recieved iron infusions until 10-2015   Anginal pain (HCC)    Anxiety    Arthritis    foot drop  lt foot  / toes   wears brace      Bronchiectasis  (HCC)    Cancer (HCC)    SKIN     Cataract    REMOVED   Chronic cough    CKD (chronic kidney disease)    Colon polyps    Coronary artery calcification seen on CT scan 12/22/2017   CT in 2016   Depression    Diabetes mellitus    Dysphagia    Dysrhythmia    RAPID HEART BEAT , CHEST TIGHTNESS  09/29/14      Gastritis    GERD (gastroesophageal reflux disease)    Hiatal hernia    History of echocardiogram    Echo 5/19: Mild focal basal septal hypertrophy, EF 60-65, normal wall motion, grade 1 diastolic dysfunction, mildly dilated ascending aorta (38 mm), normal RVSF, mild TR, PASP 23   Hyperlipidemia    Hypertension    IBS (irritable bowel syndrome)    Insomnia    Kidney stones    KIDNEY DISEASE   STAGE lll     Kidney stones    Macular disorder    MACULAR DISEASE  RT EYE    Meningitis    Osteoporosis    Pneumonia    BRONCHIECTASIS   Shortness of breath dyspnea    WITH EXERTION   Sleep apnea    uses cpap   Spina bifida occulta    Ulcer     Tobacco History: Social History   Tobacco Use  Smoking Status Never  Smokeless Tobacco Never   Counseling given: Not Answered   Outpatient Medications Prior to Visit  Medication Sig Dispense Refill   acetaminophen (TYLENOL) 500 MG tablet Take 1,000 mg by mouth every 8 (eight) hours as needed for mild pain or headache.      albuterol (VENTOLIN HFA) 108 (90 Base) MCG/ACT inhaler Inhale 1-2 puffs into the lungs every 6 (six) hours as needed for wheezing or shortness of breath. 8 g 2   amLODipine (NORVASC) 2.5 MG tablet Take one tablet by mouth daily in addition to your Amlodipine 5mg  for a total of 7.5mg  once daily 90 tablet 3   amLODipine (NORVASC) 5 MG tablet Take one tablet by mouth daily along with an Amlodipine 2.5mg  tablet for a total of 7.5mg  90 tablet 3   atorvastatin (LIPITOR) 40 MG tablet Take 1 tablet (40 mg total) by mouth daily. 90 tablet 3   benzonatate (TESSALON) 200 MG capsule Take 1 capsule (200 mg total) by mouth 3  (three) times daily as needed for cough. 30 capsule 1   calcium carbonate (OS-CAL) 600 MG TABS tablet Take 600 mg by mouth 3 (three) times daily with meals.     cephALEXin (KEFLEX) 500 MG capsule Take 500 mg by mouth 2 (two) times daily.     Cholecalciferol (VITAMIN D PO) Take 5,000 Units by mouth daily.      diazepam (VALIUM) 10 MG tablet Take 10 mg by mouth at bedtime as needed for anxiety or sleep.      diphenhydrAMINE (BENADRYL) 25 mg capsule Take 25 mg by mouth every 6 (six) hours as needed for allergies.     DULoxetine (CYMBALTA) 30 MG capsule Take 60 mg by mouth daily.   6   EPIPEN 2-PAK 0.3 MG/0.3ML SOAJ injection Inject 0.3 mg into the muscle once.  0   fluticasone (FLONASE) 50 MCG/ACT nasal spray Place 1 spray into both nostrils daily. 18.2 mL 2   furosemide (LASIX) 40 MG tablet Take 20 mg by mouth daily as needed for fluid or edema.      guaiFENesin (MUCINEX) 600 MG 12 hr tablet Take 2 tablets (1,200 mg total) by mouth 2 (two) times daily. 60 tablet 3   hydrALAZINE (APRESOLINE) 50 MG tablet TAKE 1 TABLET BY MOUTH 3  TIMES DAILY 270 tablet 3   loperamide (IMODIUM) 2 MG capsule Take 2 mg by mouth as needed for diarrhea or loose stools.     loratadine (CLARITIN) 10 MG tablet Take 10 mg by mouth daily as needed for allergies.      methocarbamol (ROBAXIN) 750 MG tablet Take 1 tablet (750 mg total) by mouth every 8 (eight) hours as needed for muscle spasms. 60 tablet 1   metoprolol succinate (TOPROL-XL) 50 MG 24 hr tablet Take 1 tablet (50 mg total) by mouth 2 (two) times daily. Take with or immediately following a meal. (Patient taking differently: Take 75 mg by mouth in the morning, at noon, and at bedtime. Take with or immediately following a meal.) 180 tablet 3   Polyethyl Glycol-Propyl Glycol 0.4-0.3 % SOLN Place 2 drops into both eyes 2 (two) times daily.      promethazine (PHENERGAN) 25 MG tablet Take 25 mg by mouth every 6 (six) hours as needed for nausea or vomiting.      traMADol  (ULTRAM) 50 MG tablet Take 1-2 tablets (50-100 mg total) by mouth every 6 (six) hours as needed. 40 tablet 0   Turmeric (QC TUMERIC COMPLEX  PO) Take by mouth.     amoxicillin-clavulanate (AUGMENTIN) 875-125 MG tablet Take 1 tablet by mouth 2 (two) times daily. 14 tablet 0   predniSONE (DELTASONE) 20 MG tablet Take 1 tablet (20 mg total) by mouth daily with breakfast. 5 tablet 0   metFORMIN (GLUCOPHAGE-XR) 500 MG 24 hr tablet TK 1 T PO BID (Patient not taking: Reported on 09/07/2021)     sodium chloride HYPERTONIC 3 % nebulizer solution Take by nebulization 2 (two) times daily. (Patient not taking: Reported on 09/07/2021) 720 mL 3   No facility-administered medications prior to visit.     Review of Systems:   Constitutional: No weight loss or gain, night sweats, fevers, chills, fatigue, or lassitude. HEENT: No headaches, difficulty swallowing, tooth/dental problems, or sore throat. No sneezing, itching, ear ache, nasal congestion, or post nasal drip CV:  No chest pain, orthopnea, PND, swelling in lower extremities, anasarca, dizziness, palpitations, syncope Resp: +shortness of breath with exertion (only with strenuous activity; unchanged from baseline). No excess mucus or change in color of mucus. No productive or non-productive. No hemoptysis. No wheezing.  No chest wall deformity GI:  No heartburn, indigestion, abdominal pain, nausea, vomiting, diarrhea, change in bowel habits, loss of appetite, bloody stools.  GU: No dysuria, change in color of urine, urgency or frequency.  No flank pain, no hematuria  Skin: No rash, lesions, ulcerations MSK:  No joint pain or swelling.  No decreased range of motion.  No back pain. Neuro: No dizziness or lightheadedness.  Psych: No depression or anxiety. Mood stable.     Physical Exam:  BP 138/74 (BP Location: Right Arm, Patient Position: Sitting, Cuff Size: Normal)    Pulse 84    Temp 98.2 F (36.8 C) (Oral)    Ht 5\' 3"  (1.6 m)    Wt 179 lb 6.4 oz (81.4  kg)    SpO2 97%    BMI 31.78 kg/m   GEN: Pleasant, interactive, well-nourished; obese; in no acute distress. HEENT:  Normocephalic and atraumatic. EACs patent bilaterally. TM pearly gray with present light reflex bilaterally. PERRLA. Sclera white. Nasal turbinates pink, moist and patent bilaterally. No rhinorrhea present. Oropharynx pink and moist, without exudate or edema. No lesions, ulcerations, or postnasal drip.  NECK:  Supple w/ fair ROM. No JVD present. Normal carotid impulses w/o bruits. Thyroid symmetrical with no goiter or nodules palpated. No lymphadenopathy.   CV: RRR, no m/r/g, no peripheral edema. Pulses intact, +2 bilaterally. No cyanosis, pallor or clubbing. PULMONARY:  Unlabored, regular breathing. Clear bilaterally A&P w/o wheezes/rales/rhonchi. No accessory muscle use. No dullness to percussion. GI: BS present and normoactive. Soft, non-tender to palpation. No organomegaly or masses detected. No CVA tenderness. MSK: No erythema, warmth or tenderness. Cap refil <2 sec all extrem. No deformities or joint swelling noted.  Neuro: A/Ox3. No focal deficits noted.   Skin: Warm, no lesions or rashe Psych: Normal affect and behavior. Judgement and thought content appropriate.     Lab Results:  CBC    Component Value Date/Time   WBC 11.1 (H) 09/01/2021 1947   RBC 3.40 (L) 09/01/2021 1947   HGB 10.2 (L) 09/01/2021 1947   HGB 11.3 11/27/2020 0952   HCT 32.7 (L) 09/01/2021 1947   HCT 34.3 11/27/2020 0952   PLT 188 09/01/2021 1947   PLT 240 11/27/2020 0952   MCV 96.2 09/01/2021 1947   MCV 91 11/27/2020 0952   MCH 30.0 09/01/2021 1947   MCHC 31.2 09/01/2021 1947   RDW 12.9 09/01/2021  1947   RDW 12.3 11/27/2020 0952   LYMPHSABS 1.3 09/01/2021 1947   MONOABS 0.6 09/01/2021 1947   EOSABS 0.1 09/01/2021 1947   BASOSABS 0.1 09/01/2021 1947    BMET    Component Value Date/Time   NA 139 09/01/2021 1947   NA 144 11/27/2020 0952   K 3.7 09/01/2021 1947   CL 104 09/01/2021  1947   CO2 22 09/01/2021 1947   GLUCOSE 225 (H) 09/01/2021 1947   BUN 30 (H) 09/01/2021 1947   BUN 25 11/27/2020 0952   CREATININE 2.29 (H) 09/01/2021 1947   CALCIUM 8.3 (L) 09/01/2021 1947   GFRNONAA 23 (L) 09/01/2021 1947   GFRAA 46 (L) 07/26/2019 1444    BNP No results found for: BNP   Imaging:  DG Chest 2 View  Result Date: 08/24/2021 CLINICAL DATA:  Productive, increased cough. EXAM: CHEST - 2 VIEW COMPARISON:  08/10/2019 FINDINGS: Heart size is normal. There is persistent perihilar peribronchial thickening, similar in appearance to prior study. No new consolidations or evidence for pulmonary edema. Degenerative changes seen in the UPPER lumbar spine. IMPRESSION: Similar, mild bronchitic changes. Electronically Signed   By: Nolon Nations M.D.   On: 08/24/2021 12:57   DG Chest Port 1 View  Result Date: 09/01/2021 CLINICAL DATA:  Short sub breath. EXAM: PORTABLE CHEST 1 VIEW COMPARISON:  Chest radiograph dated 08/24/2021. FINDINGS: Minimal bibasilar atelectasis. No focal consolidation, pleural effusion, pneumothorax. The cardiac silhouette is within normal limits. No acute osseous pathology. IMPRESSION: No active disease. Electronically Signed   By: Anner Crete M.D.   On: 09/01/2021 20:13      PFT Results Latest Ref Rng & Units 10/18/2019  FVC-Pre L 2.70  FVC-Predicted Pre % 87  FVC-Post L 2.55  FVC-Predicted Post % 82  Pre FEV1/FVC % % 84  Post FEV1/FCV % % 88  FEV1-Pre L 2.25  FEV1-Predicted Pre % 95  FEV1-Post L 2.26  DLCO uncorrected ml/min/mmHg 16.13  DLCO UNC% % 81  DLVA Predicted % 95  TLC L 4.55  TLC % Predicted % 90  RV % Predicted % 74    No results found for: NITRICOXIDE      Assessment & Plan:   Bronchiectasis without complication (HCC) Improved. Minimal SOB, only with strenuous activity (baseline), and cough resolved. Decreased mucinex to 600 mg Twice daily. Can transition to PRN if symptoms remain stable. Continue flutter valve PRN.  Previously completed prednisone and augmentin with improvement in symptoms. Never able to collect sputum sample. Continue PRN albuterol.   Patient Instructions  -Continue albuterol 1-2 puffs every 6 hours as needed for shortness of breath or wheezing -Continue claritin 10 mg daily -Continue mucinex 600 mg Twice daily  -Continue activity as tolerated -Continue flutter valve therapy  We discussed how untreated sleep apnea puts an individual at risk for cardiac arrhthymias, pulm HTN, DM, stroke and increases their risk for daytime accidents. We also briefly reviewed treatment options including weight loss, side sleeping position, oral appliance, CPAP therapy or referral to ENT for possible surgical options -Referral to orthodontics for oral appliance fitting previously placed. Please follow up with them for your fitting.  -Side sleeping position encouraged.  -You were provided with contact information to ENT if you are interested in a surgical treatment option for your sleep apnea.    Follow up in 3 months with Dr. Elsworth Soho. If symptoms do not improve or worsen, please contact office for sooner follow up or seek emergency care.   Mild obstructive sleep  apnea Not on CPAP - unable to tolerate. Awaiting consult with orthodontics for oral appliance fitting. Discussed risks of untreated sleep apnea. Does not wish to have surgical intervention at this time. No daytime fatigue symptoms present.  Seasonal allergies Well-controlled. Continue on Claritin daily with flonase PRN.      Clayton Bibles, NP 09/07/2021  Pt aware and understands NP's role.

## 2021-09-07 NOTE — Patient Instructions (Addendum)
-  Continue albuterol 1-2 puffs every 6 hours as needed for shortness of breath or wheezing -Continue claritin 10 mg daily -Continue mucinex 600 mg Twice daily  -Continue activity as tolerated -Continue flutter valve therapy  We discussed how untreated sleep apnea puts an individual at risk for cardiac arrhthymias, pulm HTN, DM, stroke and increases their risk for daytime accidents. We also briefly reviewed treatment options including weight loss, side sleeping position, oral appliance, CPAP therapy or referral to ENT for possible surgical options -Referral to orthodontics for oral appliance fitting previously placed. Please follow up with them for your fitting.  -Side sleeping position encouraged.  -You were provided with contact information to ENT if you are interested in a surgical treatment option for your sleep apnea.    Follow up in 3 months with Dr. Elsworth Soho. If symptoms do not improve or worsen, please contact office for sooner follow up or seek emergency care.

## 2021-09-07 NOTE — Assessment & Plan Note (Signed)
Not on CPAP - unable to tolerate. Awaiting consult with orthodontics for oral appliance fitting. Discussed risks of untreated sleep apnea. Does not wish to have surgical intervention at this time. No daytime fatigue symptoms present.

## 2021-09-07 NOTE — Assessment & Plan Note (Signed)
Well-controlled. Continue on Claritin daily with flonase PRN.

## 2021-09-10 ENCOUNTER — Telehealth: Payer: Self-pay | Admitting: Adult Health

## 2021-09-10 DIAGNOSIS — J471 Bronchiectasis with (acute) exacerbation: Secondary | ICD-10-CM

## 2021-09-10 MED ORDER — SODIUM CHLORIDE 3 % IN NEBU: INHALATION_SOLUTION | Freq: Two times a day (BID) | RESPIRATORY_TRACT | 3 refills | Status: AC

## 2021-09-10 NOTE — Telephone Encounter (Signed)
Rx for pt's sodium chloride Rx has been sent to [facility]. Called and spoke with pt letting her know this had been done and she verbalized understanding. Nothing further needed.

## 2021-09-25 ENCOUNTER — Telehealth: Payer: Self-pay | Admitting: Nurse Practitioner

## 2021-09-26 NOTE — Telephone Encounter (Signed)
Spoke with the pt  She says that she has received 2 emails from [facility] pharm regarding her hypertonic saline solution  We sent the rx for this to [place], so not sure why [facility] is involved  Pt never received rx for neb sol I called [facility] and spoke with the pharmacist  I was advised that they have been trying to reach the pt to ask her new pt questions and then will ship med  I called the pt and let her know all of this and gave her number to call them [phone]  Nothing further needed

## 2021-11-04 NOTE — Progress Notes (Unsigned)
Cardiology Office Note   Date:  11/05/2021   ID:  Tiffany [name], DOB 20-Oct-1950, MRN [id number]  PCP:  Prince Solian, MD  Cardiologist:   Dorris Carnes, MD   Pt presents for f/u of HTN ad palpitaitons    History of Present Illness: Tiffany [name] is a 71 y.o. female with a history of bronchiectaisis, HTN, spina bifida, achalasia, palpitations and CP  In 2016  Myovue was normal  She went on and had a L heart cath in 2019 that showed normal coronary arteries   LVEDP 17   Echo done after showed LVEF normal    Milld diastolic dysfunction Rx Lasix prn  I saw the pt in April 2022  Since seen she denies CP   Breathing is OK   She did have an anaphylactic Rxn to abx in Dec.  She is walking with some jogging      Outpatient Medications Prior to Visit  Medication Sig Dispense Refill   acetaminophen (TYLENOL) 500 MG tablet Take 1,000 mg by mouth every 8 (eight) hours as needed for mild pain or headache.      albuterol (VENTOLIN HFA) 108 (90 Base) MCG/ACT inhaler Inhale 1-2 puffs into the lungs every 6 (six) hours as needed for wheezing or shortness of breath. 8 g 2   amLODipine (NORVASC) 2.5 MG tablet Take one tablet by mouth daily in addition to your Amlodipine '5mg'$  for a total of 7.'5mg'$  once daily 90 tablet 3   amLODipine (NORVASC) 5 MG tablet Take one tablet by mouth daily along with an Amlodipine 2.'5mg'$  tablet for a total of 7.'5mg'$  90 tablet 3   atorvastatin (LIPITOR) 40 MG tablet Take 1 tablet (40 mg total) by mouth daily. 90 tablet 3   calcium carbonate (OS-CAL) 600 MG TABS tablet Take 600 mg by mouth 3 (three) times daily with meals.     Cholecalciferol (VITAMIN D PO) Take 5,000 Units by mouth daily.      diazepam (VALIUM) 10 MG tablet Take 10 mg by mouth at bedtime as needed for anxiety or sleep.      diphenhydrAMINE (BENADRYL) 25 mg capsule Take 25 mg by mouth every 6 (six) hours as needed for allergies.     DULoxetine (CYMBALTA) 30 MG capsule Take 60 mg by mouth daily.   6    EPIPEN 2-PAK 0.3 MG/0.3ML SOAJ injection Inject 0.3 mg into the muscle once.  0   fluticasone (FLONASE) 50 MCG/ACT nasal spray Place 1 spray into both nostrils daily. 18.2 mL 2   furosemide (LASIX) 40 MG tablet Take 20 mg by mouth daily as needed for fluid or edema.      guaiFENesin (MUCINEX) 600 MG 12 hr tablet Take 2 tablets (1,200 mg total) by mouth 2 (two) times daily. 60 tablet 3   hydrALAZINE (APRESOLINE) 50 MG tablet TAKE 1 TABLET BY MOUTH 3  TIMES DAILY 270 tablet 3   loperamide (IMODIUM) 2 MG capsule Take 2 mg by mouth as needed for diarrhea or loose stools.     loratadine (CLARITIN) 10 MG tablet Take 10 mg by mouth daily as needed for allergies.      metFORMIN (GLUCOPHAGE-XR) 500 MG 24 hr tablet 500 mg daily.     Polyethyl Glycol-Propyl Glycol 0.4-0.3 % SOLN Place 2 drops into both eyes 2 (two) times daily.      promethazine (PHENERGAN) 25 MG tablet Take 25 mg by mouth every 6 (six) hours as needed for nausea or vomiting.  sodium chloride HYPERTONIC 3 % nebulizer solution Take by nebulization 2 (two) times daily. 720 mL 3   Turmeric (QC TUMERIC COMPLEX PO) Take by mouth.     benzonatate (TESSALON) 200 MG capsule Take 1 capsule (200 mg total) by mouth 3 (three) times daily as needed for cough. (Patient not taking: Reported on 11/05/2021) 30 capsule 1   methocarbamol (ROBAXIN) 750 MG tablet Take 1 tablet (750 mg total) by mouth every 8 (eight) hours as needed for muscle spasms. 60 tablet 1   metoprolol succinate (TOPROL-XL) 50 MG 24 hr tablet Take 1 tablet (50 mg total) by mouth 2 (two) times daily. Take with or immediately following a meal. (Patient not taking: Reported on 11/05/2021) 180 tablet 3   traMADol (ULTRAM) 50 MG tablet Take 1-2 tablets (50-100 mg total) by mouth every 6 (six) hours as needed. (Patient not taking: Reported on 11/05/2021) 40 tablet 0   cephALEXin (KEFLEX) 500 MG capsule Take 500 mg by mouth 2 (two) times daily.     No facility-administered medications prior to  visit.     Allergies:   Ambien [zolpidem tartrate], Aspirin, Benicar [olmesartan medoxomil], Codeine, Erythromycin, Ketorolac, Ketorolac tromethamine, Latex, Lisinopril, Olmesartan, Other, Reclast [zoledronic acid], Toradol [ketorolac tromethamine], Zolpidem, Fenofibrate, Glucosamine, Glucosamine forte [nutritional supplements], Levaquin [levofloxacin in d5w], Levofloxacin, Percocet [oxycodone-acetaminophen], Tramadol, Atorvastatin, Lomotil [diphenoxylate-atropine], Metformin hcl, Tizanidine hcl, Venlafaxine, and Tizanidine   Past Medical History:  Diagnosis Date   Allergy    Anemia    pernicious anemia- recieved iron infusions until 10-2015   Anginal pain (HCC)    Anxiety    Arthritis    foot drop  lt foot  / toes   wears brace      Bronchiectasis (HCC)    Cancer (HCC)    SKIN     Cataract    REMOVED   Chronic cough    CKD (chronic kidney disease)    Colon polyps    Coronary artery calcification seen on CT scan 12/22/2017   CT in 2016   Depression    Diabetes mellitus    Dysphagia    Dysrhythmia    RAPID HEART BEAT , CHEST TIGHTNESS  09/29/14      Gastritis    GERD (gastroesophageal reflux disease)    Hiatal hernia    History of echocardiogram    Echo 5/19: Mild focal basal septal hypertrophy, EF 60-65, normal wall motion, grade 1 diastolic dysfunction, mildly dilated ascending aorta (38 mm), normal RVSF, mild TR, PASP 23   Hyperlipidemia    Hypertension    IBS (irritable bowel syndrome)    Insomnia    Kidney stones    KIDNEY DISEASE   STAGE lll     Kidney stones    Macular disorder    MACULAR DISEASE  RT EYE    Meningitis    Osteoporosis    Pneumonia    BRONCHIECTASIS   Shortness of breath dyspnea    WITH EXERTION   Sleep apnea    uses cpap   Spina bifida occulta    Ulcer     Past Surgical History:  Procedure Laterality Date   ABDOMINAL HYSTERECTOMY     BACK SURGERY     CATARACT EXTRACTION  03/2012   bilateral   COLONOSCOPY     eardrum     left   ruptured  and repaired   ELBOW SURGERY     left   fundoplication     KNEE ARTHROSCOPY     rt knee  LEFT HEART CATH AND CORONARY ANGIOGRAPHY N/A 12/25/2017   Procedure: LEFT HEART CATH AND CORONARY ANGIOGRAPHY;  Surgeon: Nelva Bush, MD;  Location: [facility];  Service: Cardiovascular;  Laterality: N/A;   LITHOTRIPSY     MENISCUS REPAIR     POLYPECTOMY     TONSILLECTOMY     TOTAL KNEE ARTHROPLASTY Right 10/11/2014   Procedure: RIGHT TOTAL KNEE ARTHROPLASTY;  Surgeon: Mcarthur Rossetti, MD;  Location: [facility];  Service: Orthopedics;  Laterality: Right;     Social History:  The patient  reports that she has never smoked. She has never used smokeless tobacco. She reports that she does not drink alcohol and does not use drugs.   Family History:  The patient's family history includes Alzheimer's disease in her father; Cancer in her mother; Colon cancer in her maternal grandmother and mother; Coronary artery disease in an other family member; Diabetes in her mother; Esophageal cancer in her cousin and cousin; Gout in her father; Heart attack (age of onset: 32) in her maternal grandmother; Heart attack (age of onset: 87) in her maternal grandfather; Hypertension in her mother; Irritable bowel syndrome in her father; Nephrolithiasis in her brother and mother; Ovarian cancer in her mother; Thyroid cancer in her sister; Ulcers in her father.    ROS:  Please see the history of present illness. All other systems are reviewed and  Negative to the above problem except as noted.    PHYSICAL EXAM: VS:  BP 122/68    Pulse 72    Ht '5\' 3"'$  (1.6 m)    Wt 178 lb (80.7 kg)    BMI 31.53 kg/m   GEN:  Obese 71 yo female in NAD HEENT: normal  Neck: no JVD, NO carotid bruits Cardiac: RRR; no murmurs,No LE edema Respiratory:  Moving air well  GI: soft, nontender, nondistended, + BS  No hepatomegaly  MS: no deformity Moving all extremities    Neuro:  Strength and sensation are intact Psych: euthymic mood,  full affect   EKG:  EKG is not ordered   Lipid Panel    Component Value Date/Time   CHOL 165 02/29/2016 0819   TRIG 279 (H) 02/29/2016 0819   HDL 42 (L) 02/29/2016 0819   CHOLHDL 3.9 02/29/2016 0819   VLDL 56 (H) 02/29/2016 0819   LDLCALC 67 02/29/2016 0819   LDLDIRECT 94.0 05/05/2015 1038      Wt Readings from Last 3 Encounters:  11/05/21 178 lb (80.7 kg)  09/07/21 179 lb 6.4 oz (81.4 kg)  09/01/21 170 lb (77.1 kg)      ASSESSMENT AND PLAN:  1  HTN   BP is controlled   Keep on same meds     2  Palpitations  No complaints    3 HL   Triglycerides high   Pt on lipitor    LDL 90   HDL 36  Trig 385    A1C     5   Renal   Follow with J Coladonato   Last Cr in Jan 1.8     6  Fatigue Energy is improved      F/U in 1 year      Signed, Dorris Carnes, MD  11/05/2021 3:24 PM    [address], [place], [place]  [address] Phone: [phone]; Fax: [phone]

## 2021-11-05 ENCOUNTER — Encounter: Payer: Self-pay | Admitting: Internal Medicine

## 2021-11-05 ENCOUNTER — Ambulatory Visit: Payer: Medicare Other | Admitting: Internal Medicine

## 2021-11-05 ENCOUNTER — Other Ambulatory Visit: Payer: Self-pay

## 2021-11-05 VITALS — BP 122/68 | HR 72 | Ht 63.0 in | Wt 178.0 lb

## 2021-11-05 DIAGNOSIS — I10 Essential (primary) hypertension: Secondary | ICD-10-CM | POA: Diagnosis not present

## 2021-11-05 NOTE — Patient Instructions (Signed)
Medication Instructions:  Your physician recommends that you continue on your current medications as directed. Please refer to the Current Medication list given to you today.  *If you need a refill on your cardiac medications before your next appointment, please call your pharmacy*   Lab Work: none If you have labs (blood work) drawn today and your tests are completely normal, you will receive your results only by: MyChart Message (if you have MyChart) OR A paper copy in the mail If you have any lab test that is abnormal or we need to change your treatment, we will call you to review the results.   Testing/Procedures: none   Follow-Up: At [facility], you and your health needs are our priority.  As part of our continuing mission to provide you with exceptional heart care, we have created designated Provider Care Teams.  These Care Teams include your primary Cardiologist (physician) and Advanced Practice Providers (APPs -  Physician Assistants and Nurse Practitioners) who all work together to provide you with the care you need, when you need it.  We recommend signing up for the patient portal called "MyChart".  Sign up information is provided on this After Visit Summary.  MyChart is used to connect with patients for Virtual Visits (Telemedicine).  Patients are able to view lab/test results, encounter notes, upcoming appointments, etc.  Non-urgent messages can be sent to your provider as well.   To learn more about what you can do with MyChart, go to [email or web address].    Your next appointment:   1 year(s)  The format for your next appointment:   In Person  Provider:   Paula Ross, MD     Other Instructions   

## 2021-11-06 ENCOUNTER — Ambulatory Visit: Payer: Medicare Other | Admitting: Allergy and Immunology

## 2021-11-06 ENCOUNTER — Encounter: Payer: Self-pay | Admitting: Allergy and Immunology

## 2021-11-06 VITALS — BP 130/70 | HR 72 | Temp 96.9°F | Resp 18 | Ht 63.0 in | Wt 177.2 lb

## 2021-11-06 DIAGNOSIS — T7800XA Anaphylactic reaction due to unspecified food, initial encounter: Secondary | ICD-10-CM

## 2021-11-06 DIAGNOSIS — T50905D Adverse effect of unspecified drugs, medicaments and biological substances, subsequent encounter: Secondary | ICD-10-CM

## 2021-11-06 MED ORDER — AMOXICILLIN 250 MG/5ML PO SUSR: 250.0000 mg | Freq: Three times a day (TID) | ORAL | 0 refills | Status: DC

## 2021-11-06 MED ORDER — EPIPEN 2-PAK 0.3 MG/0.3ML IJ SOAJ: 0.3000 mg | Freq: Once | INTRAMUSCULAR | 1 refills | Status: AC

## 2021-11-06 NOTE — Progress Notes (Signed)
[place] - [place] - [place] - [place] - [place]   Dear Dr. Arrie Aran,  Thank you for referring Tiffany [name] to the [facility] [place] on 11/06/2021.   Below is a summation of this patient's evaluation and recommendations.  Thank you for your referral. I will keep you informed about this patient's response to treatment.   If you have any questions please do not hesitate to contact me.   Sincerely,  Jessica Priest, MD Allergy / Immunology [facility]   ______________________________________________________________________    NEW PATIENT NOTE  Referring Provider: Terrial Rhodes, MD Primary Provider: Chilton Greathouse, MD Date of office visit: 11/06/2021    Subjective:   Chief Complaint:  Tiffany [name] (DOB: [birth year]) is a 71 y.o. female who presents to the clinic on 11/06/2021 with a chief complaint of Allergic Reaction (Says she had a reaction to a medication and was sent here by her PCP. ) .     HPI: [name] presents to this clinic in evaluation of recurrent anaphylaxis.  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 She has acquired various food allergies during the interval.  When she eats mango she gets tongue swelling which occurred about 4 years ago requiring her to use an EpiPen.  Apparently she gets some tongue irritation when consuming peanuts.  She can eat almonds and walnuts and pecans and cashews and pistachios for the most part with no problems.  She remains away from peaches and shellfish as this may have caused a reaction in the past.  She had a episode of "anaphylaxis" on 31 August 2021 while being treated for bronchiectasis with Augmentin.  Apparently on day 7 of Augmentin she felt as though there was something in her throat and her throat felt like it was  closing and she took 2 Benadryl and that she went to the emergency room after using an EpiPen and apparently when she was in the emergency room she was given Ativan.  She states that she has had problems with antibiotics in the past.  Most of her problems are related to nausea and diarrhea.  This occurs with Levaquin and this occurs with cephalexin.  She has not had a anaphylactic reaction to any other antibiotic.  She has chronic kidney disease and urinary tract infections and requires antibiotics to address these issues.  Past Medical History:  Diagnosis Date   Allergy    Anemia    pernicious anemia- recieved iron infusions until 10-2015   Anginal pain (HCC)    Anxiety    Arthritis    foot drop  lt foot  / toes   wears brace      Bronchiectasis (HCC)    Cancer (HCC)    SKIN     Cataract    REMOVED   Chronic cough    CKD (chronic kidney disease)    Colon polyps    Coronary artery calcification seen on CT scan 12/22/2017   CT in 2016   Depression    Diabetes mellitus    Dysphagia    Dysrhythmia    RAPID HEART BEAT , CHEST TIGHTNESS  09/29/14      Gastritis    GERD (gastroesophageal reflux disease)    Hiatal hernia    History of echocardiogram    Echo 5/19: Mild focal basal septal  hypertrophy, EF 60-65, normal wall motion, grade 1 diastolic dysfunction, mildly dilated ascending aorta (38 mm), normal RVSF, mild TR, PASP 23   Hyperlipidemia    Hypertension    IBS (irritable bowel syndrome)    Insomnia    Kidney stones    KIDNEY DISEASE   STAGE lll     Kidney stones    Macular disorder    MACULAR DISEASE  RT EYE    Meningitis    Osteoporosis    Pneumonia    BRONCHIECTASIS   Shortness of breath dyspnea    WITH EXERTION   Sleep apnea    uses cpap   Spina bifida occulta    Ulcer     Past Surgical History:  Procedure Laterality Date   ABDOMINAL HYSTERECTOMY     BACK SURGERY     CATARACT EXTRACTION  03/2012   bilateral   COLONOSCOPY     eardrum     left   ruptured and  repaired   ELBOW SURGERY     left   fundoplication     KNEE ARTHROSCOPY     rt knee    LEFT HEART CATH AND CORONARY ANGIOGRAPHY N/A 12/25/2017   Procedure: LEFT HEART CATH AND CORONARY ANGIOGRAPHY;  Surgeon: Yvonne Kendall, MD;  [facility];  Service: Cardiovascular;  Laterality: N/A;   LITHOTRIPSY     MENISCUS REPAIR     POLYPECTOMY     TONSILLECTOMY     TOTAL KNEE ARTHROPLASTY Right 10/11/2014   Procedure: RIGHT TOTAL KNEE ARTHROPLASTY;  Surgeon: Kathryne Hitch, MD;  Location: [facility];  Service: Orthopedics;  Laterality: Right;    Allergies as of 11/06/2021       Reactions   Ambien [zolpidem Tartrate] Other (See Comments)   Nightmares and insomnia   Aspirin Other (See Comments)   GI bleed   Benicar [olmesartan Medoxomil] Shortness Of Breath, Swelling, Other (See Comments)   Swelling of throat   Codeine Nausea And Vomiting   Erythromycin Nausea And Vomiting   Ketorolac Anaphylaxis   Ketorolac Tromethamine Anaphylaxis   Latex Shortness Of Breath, Itching   Lisinopril Shortness Of Breath, Swelling, Other (See Comments)    Swelling of throat   Olmesartan Other (See Comments), Shortness Of Breath, Swelling   Swelling of throat   Other Anaphylaxis   Peaches and mangoes, tree nuts, tomatoes, shellfish, Cat dandor, green peas   Reclast [zoledronic Acid] Other (See Comments)   Ocular reaction, toxic   Toradol [ketorolac Tromethamine] Anaphylaxis   Zolpidem Other (See Comments)   Nightmares and insomnia   Fenofibrate Nausea And Vomiting   Glucosamine Diarrhea, Nausea Only   Glucosamine Forte [nutritional Supplements] Diarrhea, Nausea Only   Levaquin [levofloxacin In D5w] Diarrhea, Swelling   Levofloxacin Diarrhea, Swelling   Percocet [oxycodone-acetaminophen] Nausea And Vomiting   Tramadol Nausea And Vomiting   Atorvastatin Other (See Comments)   Lomotil [diphenoxylate-atropine] Other (See Comments)   Hallucinations   Metformin Hcl Other (See Comments)    Tizanidine Hcl Other (See Comments)   Venlafaxine Other (See Comments)   Tizanidine Nausea And Vomiting        Medication List    albuterol 108 (90 Base) MCG/ACT inhaler Commonly known as: VENTOLIN HFA Inhale 1-2 puffs into the lungs every 6 (six) hours as needed for wheezing or shortness of breath.   amLODipine 5 MG tablet Commonly known as: NORVASC Take one tablet by mouth daily along with an Amlodipine 2.5mg  tablet for a total of 7.5mg   amLODipine 2.5 MG tablet Commonly known as: NORVASC Take one tablet by mouth daily in addition to your Amlodipine 5mg  for a total of 7.5mg  once daily   atorvastatin 40 MG tablet Commonly known as: LIPITOR Take 1 tablet (40 mg total) by mouth daily.   calcium carbonate 600 MG Tabs tablet Commonly known as: OS-CAL Take 600 mg by mouth 3 (three) times daily with meals.   diazepam 10 MG tablet Commonly known as: VALIUM Take 10 mg by mouth at bedtime as needed for anxiety or sleep.   diphenhydrAMINE 25 mg capsule Commonly known as: BENADRYL Take 25 mg by mouth every 6 (six) hours as needed for allergies.   DULoxetine 60 MG capsule Commonly known as: CYMBALTA TAKE 1 CAPSULE BY MOUTH IN  THE EVENING WITH FOOD   DULoxetine 30 MG capsule Commonly known as: CYMBALTA Take 60 mg by mouth daily.   EpiPen 2-Pak 0.3 mg/0.3 mL Soaj injection Generic drug: EPINEPHrine Inject 0.3 mg into the muscle once.   fluticasone 50 MCG/ACT nasal spray Commonly known as: FLONASE Place 1 spray into both nostrils daily.   furosemide 40 MG tablet Commonly known as: LASIX Take 20 mg by mouth daily as needed for fluid or edema.   hydrALAZINE 50 MG tablet Commonly known as: APRESOLINE 1 tablet with food   hydrALAZINE 50 MG tablet Commonly known as: APRESOLINE TAKE 1 TABLET BY MOUTH 3  TIMES DAILY   loperamide 2 MG capsule Commonly known as: IMODIUM Take 2 mg by mouth as needed for diarrhea or loose stools.   loratadine 10 MG tablet Commonly  known as: CLARITIN Take 10 mg by mouth daily as needed for allergies.   Meclizine HCl 25 MG Chew take one tab by mouth every 6 hrs as needed for vertigo   metFORMIN HCl ER 500 MG/5ML Srer 1 tablet with evening meal   metFORMIN 500 MG 24 hr tablet Commonly known as: GLUCOPHAGE-XR 500 mg daily.   metoprolol succinate 50 MG 24 hr tablet Commonly known as: TOPROL-XL Take 1 tablet (50 mg total) by mouth 2 (two) times daily. Take with or immediately following a meal.   nitroGLYCERIN 0.4 mg/hr patch Commonly known as: NITRODUR - Dosed in mg/24 hr PLACE 1 TABLET UNDER THE TONGUE EVERY 5 MINUTES WHEN NECESSARY FOR CHEST PAIN   OneTouch Verio Reflect w/Device Kit USE AS DIRECTED TO CHECK BLOOD SUGAR TWICE TO THREE TIMES DAILY (DX: ICD10: E11.65)   OneTouch Verio Flex System w/Device Kit USE AS DIRECTED TO CHECK BLOOD SUGAR 2-3 TIMES DAILY. Dx: E11.29   Polyethyl Glycol-Propyl Glycol 0.4-0.3 % Soln Place 2 drops into both eyes 2 (two) times daily.   Systane 0.4-0.3 % Gel ophthalmic gel Generic drug: Polyethyl Glycol-Propyl Glycol See admin instructions.   promethazine 25 MG tablet Commonly known as: PHENERGAN Take 25 mg by mouth every 6 (six) hours as needed for nausea or vomiting.   QC TUMERIC COMPLEX PO Take by mouth.   sodium chloride HYPERTONIC 3 % nebulizer solution Take by nebulization 2 (two) times daily.   VITAMIN D PO Take 5,000 Units by mouth daily.    Review of systems negative except as noted in HPI / PMHx or noted below:  Review of Systems  Constitutional: Negative.   HENT: Negative.    Eyes: Negative.   Respiratory: Negative.    Cardiovascular: Negative.   Gastrointestinal: Negative.   Genitourinary: Negative.   Musculoskeletal: Negative.   Skin: Negative.   Neurological: Negative.   Endo/Heme/Allergies: Negative.  Psychiatric/Behavioral: Negative.     Family History  Problem Relation Age of Onset   Alzheimer's disease Father    Irritable bowel  syndrome Father    Ulcers Father    Gout Father    Cancer Mother        ?   Diabetes Mother    Hypertension Mother    Nephrolithiasis Mother    Colon cancer Mother    Ovarian cancer Mother    Coronary artery disease Other        grandparent   Thyroid cancer Sister    Nephrolithiasis Brother    Colon cancer Maternal Grandmother    Heart attack Maternal Grandmother 75   Esophageal cancer Cousin    Esophageal cancer Cousin    Heart attack Maternal Grandfather 22   Colon polyps Neg Hx    Rectal cancer Neg Hx    Stomach cancer Neg Hx     Social History   Socioeconomic History   Marital status: Married    Spouse name: Not on file   Number of children: 2   Years of education: Not on file   Highest education level: Not on file  Occupational History   Occupation: Retired    Comment: LPN  Tobacco Use   Smoking status: Never   Smokeless tobacco: Never  Vaping Use   Vaping Use: Never used  Substance and Sexual Activity   Alcohol use: No    Comment: OCC.   Drug use: No   Sexual activity: Not Currently  Other Topics Concern   Not on file  Social History Narrative   Not on file   Environmental and Social history  Lives in a house with a dry environment, a dog located inside the household, carpet in the bedroom, plastic on the bed, plastic on the pillow, no smoking ongoing with inside the household.  She is a retired Engineer, civil (consulting).  Objective:   Vitals:   11/06/21 1359  BP: 130/70  Pulse: 72  Resp: 18  Temp: (!) 96.9 F (36.1 C)  SpO2: 100%   Height: 5\' 3"  (160 cm) Weight: 177 lb 3.2 oz (80.4 kg)  Physical Exam Constitutional:      Appearance: She is not diaphoretic.  HENT:     Head: Normocephalic.     Right Ear: Tympanic membrane, ear canal and external ear normal.     Left Ear: Tympanic membrane, ear canal and external ear normal.     Nose: Nose normal. No mucosal edema or rhinorrhea.     Mouth/Throat:     Pharynx: Uvula midline. No oropharyngeal exudate.   Eyes:     Conjunctiva/sclera: Conjunctivae normal.  Neck:     Thyroid: No thyromegaly.     Trachea: Trachea normal. No tracheal tenderness or tracheal deviation.  Cardiovascular:     Rate and Rhythm: Normal rate and regular rhythm.     Heart sounds: Normal heart sounds, S1 normal and S2 normal. No murmur heard. Pulmonary:     Effort: No respiratory distress.     Breath sounds: Normal breath sounds. No stridor. No wheezing or rales.  Lymphadenopathy:     Head:     Right side of head: No tonsillar adenopathy.     Left side of head: No tonsillar adenopathy.     Cervical: No cervical adenopathy.  Skin:    Findings: No erythema or rash.     Nails: There is no clubbing.  Neurological:     Mental Status: She is alert.  Diagnostics: Allergy skin tests were performed.  She did not demonstrate any hypersensitivity to a screening panel of foods.  Assessment and Plan:    1. Adverse effect of drug, subsequent encounter   2. Allergy with anaphylaxis due to food     1.  Allergen avoidance measures - mango, peach, peanut, shellfish  2.  Arrange for penicillin testing and possible amoxicillin challenge.  Amoxil 250/5 dry powder bottle brought to clinic for challenge  3.  EpiPen, Benadryl, MD/ER evaluation for allergic reaction  4.  Further evaluation???  I think the issue that needs to be worked through initially regarding these atopic disease is possible penicillin hypersensitivity.  We will arrange to have her skin tested directed against penicillin members and then challenge her with amoxicillin.  She does have a history of GI intolerance to levofloxacin and cephalexin and thus those 2 antibiotics will not be administered in the future.  She does have sensitivity to various foods that has been a longstanding issue and she does very well as long as she does not eat these foods and she will continue to perform avoidance measures regarding these agents at this point.  Jessica Priest,  MD Allergy / Immunology [facility]

## 2021-11-06 NOTE — Patient Instructions (Addendum)
?  1.  Allergen avoidance measures - mango, peach, peanut, shellfish ? ?2.  Arrange for penicillin testing and possible amoxicillin challenge.  Amoxil 250/5 dry powder bottle brought to clinic for challenge ? ?3.  EpiPen, Benadryl, MD/ER evaluation for allergic reaction ? ?4.  Further evaluation??? ? ?  ?

## 2021-11-07 ENCOUNTER — Encounter: Payer: Self-pay | Admitting: Allergy and Immunology

## 2021-11-13 ENCOUNTER — Other Ambulatory Visit: Payer: Self-pay | Admitting: Internal Medicine

## 2021-11-14 ENCOUNTER — Other Ambulatory Visit: Payer: Self-pay | Admitting: Internal Medicine

## 2021-11-19 ENCOUNTER — Other Ambulatory Visit: Payer: Self-pay | Admitting: Nurse Practitioner

## 2021-11-19 DIAGNOSIS — B9689 Other specified bacterial agents as the cause of diseases classified elsewhere: Secondary | ICD-10-CM

## 2021-11-25 ENCOUNTER — Other Ambulatory Visit: Payer: Self-pay | Admitting: Internal Medicine

## 2021-11-27 ENCOUNTER — Other Ambulatory Visit: Payer: Self-pay

## 2021-11-27 MED ORDER — AMLODIPINE BESYLATE 2.5 MG PO TABS: ORAL_TABLET | ORAL | 3 refills | Status: DC

## 2021-11-27 NOTE — Telephone Encounter (Signed)
Pt's medication was sent to pt's pharmacy as requested. Confirmation received.  °

## 2021-11-29 ENCOUNTER — Ambulatory Visit: Payer: Medicare Other | Admitting: Pulmonary Disease

## 2021-11-29 ENCOUNTER — Encounter: Payer: Self-pay | Admitting: Pulmonary Disease

## 2021-11-29 VITALS — BP 122/70 | HR 74 | Temp 98.2°F | Ht 63.0 in | Wt 175.0 lb

## 2021-11-29 DIAGNOSIS — R519 Headache, unspecified: Secondary | ICD-10-CM

## 2021-11-29 DIAGNOSIS — G4733 Obstructive sleep apnea (adult) (pediatric): Secondary | ICD-10-CM | POA: Diagnosis not present

## 2021-11-29 DIAGNOSIS — J479 Bronchiectasis, uncomplicated: Secondary | ICD-10-CM

## 2021-11-29 NOTE — Progress Notes (Signed)
? ?  Subjective:  ? ? Patient ID: Tiffany [name], female    DOB: February 27, 1951, 71 y.o.   MRN: [id number] ? ?HPI ? 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  ?admitted 10/2013 for hypoxia and bronchospasm and infiltrates on CT that resolved on follow-up imaging,  Felt to be aspiration ?  ?PMH spina bifida, Nissen's fundoplication in the 54M,[id number] infusion reaction in 2012, hypertension and diabetes, IBS ? ?Chief Complaint  ?Patient presents with  ? Follow-up  ?  3 month follow up. Pt states her breathing is doing good. Using flutter vlave. Using inahlers as orders. Wearing cpap. Brought in [place] card.   ? ? ?She had an episode of bronchitis in December, required 2 courses of doxycycline and Augmentin, use Mucinex and flutter valve was never able to obtain hypertonic saline nebs and time. ?She then had what seems like an angioedema reaction after finishing course of Augmentin.  She currently has follow-up appointment with allergy for skin testing with penicillin ?She admits to poor compliance with CPAP, has a nasal mask, DreamWear with the hose coming from the top of the head and this makes her sweat. ?No problems with pressure ? ?Significant tests/ events reviewed ? ?09/2019 PFTs-normal lung function, no airway obstruction, DLCO 81%  ?  ?01/2008 UGI series showed Achalasia-like appearance of the distal esophagus, s/o  stricture of the distal esophagus ?  ?12/2013 Spirometry showed no evidence of airway obstruction normal lung function ?  ?  ?CT 06/2014 Complete resolution of multifocal peribronchovascular micronodularity noted on the prior study. The small 3 mm left lower lobe nodule noted on the prior examination has completely resolved. The larger nodule in the right lung  much smaller  ?  ? ?Review of Systems ?neg for any significant sore throat, dysphagia, itching, sneezing, nasal congestion or excess/ purulent secretions, fever, chills, sweats, unintended wt loss, pleuritic or exertional  cp, hempoptysis, orthopnea pnd or change in chronic leg swelling. Also denies presyncope, palpitations, heartburn, abdominal pain, nausea, vomiting, diarrhea or change in bowel or urinary habits, dysuria,hematuria, rash, arthralgias, visual complaints, headache, numbness weakness or ataxia. ? ?   ?Objective:  ? Physical Exam ? ?Gen. Pleasant, obese, in no distress ?ENT - no lesions, no post nasal drip ?Neck: No JVD, no thyromegaly, no carotid bruits ?Lungs: no use of accessory muscles, no dullness to percussion, decreased without rales or rhonchi  ?Cardiovascular: Rhythm regular, heart sounds  normal, no murmurs or gallops, no peripheral edema ?Musculoskeletal: No deformities, no cyanosis or clubbing , no tremors ? ? ? ?   ?Assessment & Plan:  ? ? ?

## 2021-11-29 NOTE — Assessment & Plan Note (Addendum)
Mild bronchiectasis noted on previous imaging-no etiology identified. ?We will obtain high-resolution CT to look for progression and if so we will obtain sputum for AFB ?-Doubt we  have to consider MAC infection here ?

## 2021-11-29 NOTE — Assessment & Plan Note (Signed)
Mild OSA being managed by neurology. ?Current poor compliance related to sweating from the DreamWear nasal mask.  At discussed alternative masks with her and she agreed to try the Swift fracture nasal mask for her ? ?Weight loss encouraged, compliance with goal of at least 4-6 hrs every night is the expectation. ?Advised against medications with sedative side effects ?Cautioned against driving when sleepy - understanding that sleepiness will vary on a day to day basis ? ?

## 2021-11-29 NOTE — Patient Instructions (Addendum)
?  X HRCT chest for bronchiectasis ? ?X Swift small nasal mask for her to [facility] ? ? ?

## 2021-11-30 ENCOUNTER — Telehealth: Payer: Self-pay | Admitting: Pulmonary Disease

## 2021-12-03 NOTE — Telephone Encounter (Signed)
Called and spoke with Tiffany [name] and let him know I would fax over face to face notes. Called and spoke with patient to let her know that notes will be faxed over to Tiffany [name] from [facility].  ? ?Nothing further needed.  ?

## 2021-12-14 ENCOUNTER — Ambulatory Visit
Admission: RE | Admit: 2021-12-14 | Discharge: 2021-12-14 | Disposition: A | Discharge status: Home / Self Care | Payer: Medicare Other | Source: Ambulatory Visit | Attending: Pulmonary Disease | Admitting: Pulmonary Disease

## 2021-12-14 DIAGNOSIS — J479 Bronchiectasis, uncomplicated: Secondary | ICD-10-CM

## 2022-01-01 ENCOUNTER — Ambulatory Visit: Payer: Medicare Other | Admitting: Allergy and Immunology

## 2022-01-01 VITALS — BP 122/62 | HR 63 | Temp 97.6°F | Resp 16 | Ht 63.0 in | Wt 184.0 lb

## 2022-01-01 DIAGNOSIS — T50905A Adverse effect of unspecified drugs, medicaments and biological substances, initial encounter: Secondary | ICD-10-CM | POA: Diagnosis not present

## 2022-01-01 DIAGNOSIS — T50905D Adverse effect of unspecified drugs, medicaments and biological substances, subsequent encounter: Secondary | ICD-10-CM

## 2022-01-01 MED ORDER — EPINEPHRINE (ANAPHYLAXIS) 1 MG/ML IJ SOLN
1.5000 mg | Freq: Once | INTRAMUSCULAR | Status: AC
Start: 2022-01-01 — End: 2022-01-01
  Administered 2022-01-01: 1.5 mg via INTRAMUSCULAR

## 2022-01-01 NOTE — Progress Notes (Incomplete)
? ?[place] ? ? ?Follow-up Note ? ?Referring Provider: Prince Solian, MD ?Primary Provider: Prince Solian, MD ?Date of Office Visit: 01/01/2022 ? ?Subjective:  ? ?Tiffany [name] (DOB: [birth year]) is a 71 y.o. female who returns to the [facility] [place] on 01/01/2022 in re-evaluation of the following: ? ?HPI: Tiffany [name] ? ?Allergies as of 01/01/2022   ? ?   Reactions  ? Ambien [zolpidem Tartrate] Other (See Comments)  ? Nightmares and insomnia  ? Aspirin Other (See Comments)  ? GI bleed  ? Benicar [olmesartan Medoxomil] Shortness Of Breath, Swelling, Other (See Comments)  ? Swelling of throat  ? Codeine Nausea And Vomiting  ? Erythromycin Nausea And Vomiting  ? Ketorolac Anaphylaxis  ? Ketorolac Tromethamine Anaphylaxis  ? Latex Shortness Of Breath, Itching  ? Lisinopril Shortness Of Breath, Swelling, Other (See Comments)  ?  Swelling of throat  ? Olmesartan Other (See Comments), Shortness Of Breath, Swelling  ? Swelling of throat  ? Other Anaphylaxis  ? Peaches and mangoes, tree nuts, tomatoes, shellfish, Cat dandor, green peas  ? Reclast [zoledronic Acid] Other (See Comments)  ? Ocular reaction, toxic  ? Toradol [ketorolac Tromethamine] Anaphylaxis  ? Zolpidem Other (See Comments)  ? Nightmares and insomnia  ? Fenofibrate Nausea And Vomiting  ? Glucosamine Diarrhea, Nausea Only  ? Glucosamine Forte [nutritional Supplements] Diarrhea, Nausea Only  ? Levaquin [levofloxacin In D5w] Diarrhea, Swelling  ? Levofloxacin Diarrhea, Swelling  ? Percocet [oxycodone-acetaminophen] Nausea And Vomiting  ? Tramadol Nausea And Vomiting  ? Atorvastatin Other (See Comments)  ? Lomotil [diphenoxylate-atropine] Other (See Comments)  ? Hallucinations  ? Metformin Hcl Other (See Comments)  ? Tizanidine Hcl Other (See Comments)  ? Venlafaxine Other (See Comments)  ? Tizanidine Nausea And Vomiting  ? ?  ? ?  ?Medication List  ?  ? ?  ? Accurate as of Jan 01, 2022  9:23 AM.  If you have any questions, ask your nurse or doctor.  ?  ?  ? ?  ? ?albuterol 108 (90 Base) MCG/ACT inhaler ?Commonly known as: VENTOLIN HFA ?Inhale 1-2 puffs into the lungs every 6 (six) hours as needed for wheezing or shortness of breath. ?  ?amLODipine 5 MG tablet ?Commonly known as: NORVASC ?TAKE 1 AND 1/2 TABLETS BY  MOUTH DAILY ?  ?amLODipine 2.5 MG tablet ?Commonly known as: NORVASC ?Take one tablet by mouth daily in addition to your Amlodipine 92m for a total of 7.538monce daily ?  ?atorvastatin 40 MG tablet ?Commonly known as: LIPITOR ?Take 1 tablet (40 mg total) by mouth daily. ?  ?calcium carbonate 600 MG Tabs tablet ?Commonly known as: OS-CAL ?Take 600 mg by mouth 3 (three) times daily with meals. ?  ?diazepam 10 MG tablet ?Commonly known as: VALIUM ?Take 10 mg by mouth at bedtime as needed for anxiety or sleep. ?  ?diphenhydrAMINE 25 mg capsule ?Commonly known as: BENADRYL ?Take 25 mg by mouth every 6 (six) hours as needed for allergies. ?  ?DULoxetine 60 MG capsule ?Commonly known as: CYMBALTA ?TAKE 1 CAPSULE BY MOUTH IN  THE EVENING WITH FOOD ?  ?DULoxetine 30 MG capsule ?Commonly known as: CYMBALTA ?Take 60 mg by mouth daily. ?  ?fluticasone 50 MCG/ACT nasal spray ?Commonly known as: FLONASE ?SHAKE LIQUID AND USE 1 SPRAY IN EACH NOSTRIL DAILY ?  ?furosemide 40 MG tablet ?Commonly known as: LASIX ?Take 20 mg by mouth daily as needed for fluid or edema. ?  ?  hydrALAZINE 50 MG tablet ?Commonly known as: APRESOLINE ?TAKE 1 TABLET BY MOUTH 3  TIMES DAILY ?  ?loperamide 2 MG capsule ?Commonly known as: IMODIUM ?Take 2 mg by mouth as needed for diarrhea or loose stools. ?  ?loratadine 10 MG tablet ?Commonly known as: CLARITIN ?Take 10 mg by mouth daily as needed for allergies. ?  ?Meclizine HCl 25 MG Chew ?take one tab by mouth every 6 hrs as needed for vertigo ?  ?metFORMIN 500 MG 24 hr tablet ?Commonly known as: GLUCOPHAGE-XR ?500 mg daily. ?  ?metoprolol succinate 50 MG 24 hr tablet ?Commonly known as:  TOPROL-XL ?Take 1 tablet (50 mg total) by mouth 2 (two) times daily. Take with or immediately following a meal. ?  ?nitroGLYCERIN 0.4 mg/hr patch ?Commonly known as: NITRODUR - Dosed in mg/24 hr ?PLACE 1 TABLET UNDER THE TONGUE EVERY 5 MINUTES WHEN NECESSARY FOR CHEST PAIN ?  ?OneTouch Verio Reflect w/Device Kit ?USE AS DIRECTED TO CHECK BLOOD SUGAR TWICE TO THREE TIMES DAILY (DX: ICD10: E11.65) ?  ?OneTouch Verio Flex System w/Device Kit ?USE AS DIRECTED TO CHECK BLOOD SUGAR 2-3 TIMES DAILY. Dx: E11.29 ?  ?Polyethyl Glycol-Propyl Glycol 0.4-0.3 % Soln ?Place 2 drops into both eyes 2 (two) times daily. ?  ?Systane 0.4-0.3 % Gel ophthalmic gel ?Generic drug: Polyethyl Glycol-Propyl Glycol ?See admin instructions. ?  ?promethazine 25 MG tablet ?Commonly known as: PHENERGAN ?Take 25 mg by mouth every 6 (six) hours as needed for nausea or vomiting. ?  ?QC TUMERIC COMPLEX PO ?Take by mouth. ?  ?sodium chloride HYPERTONIC 3 % nebulizer solution ?Take by nebulization 2 (two) times daily. ?  ?VITAMIN D PO ?Take 5,000 Units by mouth daily. ?  ? ?  ? ? ?Past Medical History:  ?Diagnosis Date  ?? Allergy   ?? Anemia   ? pernicious anemia- recieved iron infusions until 10-2015  ?? Anginal pain ([place])   ?? Anxiety   ?? Arthritis   ? foot drop  lt foot  / toes   wears brace     ?? Bronchiectasis ([place])   ?? Cancer [facility])   ? SKIN    ?? Cataract   ? REMOVED  ?? Chronic cough   ?? CKD (chronic kidney disease)   ?? Colon polyps   ?? Coronary artery calcification seen on CT scan 12/22/2017  ? CT in 2016  ?? Depression   ?? Diabetes mellitus   ?? Dysphagia   ?? Dysrhythmia   ? RAPID HEART BEAT , CHEST TIGHTNESS  09/29/14     ?? Gastritis   ?? GERD (gastroesophageal reflux disease)   ?? Hiatal hernia   ?? History of echocardiogram   ? Echo 5/19: Mild focal basal septal hypertrophy, EF 60-65, normal wall motion, grade 1 diastolic dysfunction, mildly dilated ascending aorta (38 mm), normal RVSF, mild TR, PASP 23  ?? Hyperlipidemia   ??  Hypertension   ?? IBS (irritable bowel syndrome)   ?? Insomnia   ?? Kidney stones   ? KIDNEY DISEASE   STAGE lll    ?? Kidney stones   ?? Macular disorder   ? MACULAR DISEASE  RT EYE   ?? Meningitis   ?? Osteoporosis   ?? Pneumonia   ? BRONCHIECTASIS  ?? Shortness of breath dyspnea   ? WITH EXERTION  ?? Sleep apnea   ? uses cpap  ?? Spina bifida occulta   ?? Ulcer   ? ? ?Past Surgical History:  ?Procedure Laterality Date  ?? ABDOMINAL  HYSTERECTOMY    ?? BACK SURGERY    ?? CATARACT EXTRACTION  03/2012  ? bilateral  ?? COLONOSCOPY    ?? eardrum    ? left   ruptured and repaired  ?? ELBOW SURGERY    ? left  ?? fundoplication    ?? KNEE ARTHROSCOPY    ? rt knee   ?? LEFT HEART CATH AND CORONARY ANGIOGRAPHY N/A 12/25/2017  ? Procedure: LEFT HEART CATH AND CORONARY ANGIOGRAPHY;  Surgeon: Nelva Bush, MD;  Location: [place] [facility];  Service: Cardiovascular;  Laterality: N/A;  ?? LITHOTRIPSY    ?? MENISCUS REPAIR    ?? POLYPECTOMY    ?? TONSILLECTOMY    ?? TOTAL KNEE ARTHROPLASTY Right 10/11/2014  ? Procedure: RIGHT TOTAL KNEE ARTHROPLASTY;  Surgeon: Mcarthur Rossetti, MD;  Location: [place];  Service: Orthopedics;  Laterality: Right;  ? ? ?Review of systems negative except as noted in HPI / PMHx or noted below: ? ?ROS ? ? ?Objective:  ? ?There were no vitals filed for this visit. ?   ?   ? ?Physical Exam ? ?Diagnostics:  ?  ?Spirometry was performed and demonstrated an FEV1 of *** at *** % of predicted. ? ?The patient had an Asthma Control Test with the following results:  .   ? ?Assessment and Plan:  ? ?No diagnosis found. ? ?There are no Patient Instructions on file for this visit. ? ?Allena Katz, MD ?Allergy / Immunology ?[place] ?

## 2022-01-02 ENCOUNTER — Encounter: Payer: Self-pay | Admitting: Allergy and Immunology

## 2022-01-02 NOTE — Progress Notes (Signed)
Tiffany [name] presents to this clinic to have a oral drug challenge with amoxicillin.  Utilizing standard incremental drug challenge protocol she was administered increasing amounts of amoxicillin over the course of several hours.  She tolerated administration of approximately 100 mg of amoxicillin but when administered 250 mg of amoxicillin a single dose she developed stomach upset, bowel movement, feeling bad in general with some dizziness.  Her vital signs were stable.  She was administered 0.15 epinephrine, 20 mg prednisone, 10 mg cetirizine and observed for an additional 60 minutes at which point in time she had resolved all of her issues and she was discharged from the clinic.  Based upon her response to this amoxicillin exposure we will label her as an individual who cannot receive amoxicillin in the future.  Whether this was an GI intolerance or a true allergic reaction it is obvious that she cannot receive this antibiotic. ?

## 2022-01-18 ENCOUNTER — Other Ambulatory Visit: Payer: Self-pay

## 2022-01-18 ENCOUNTER — Telehealth: Payer: Self-pay | Admitting: Internal Medicine

## 2022-01-18 DIAGNOSIS — R109 Unspecified abdominal pain: Secondary | ICD-10-CM

## 2022-01-18 DIAGNOSIS — K580 Irritable bowel syndrome with diarrhea: Secondary | ICD-10-CM

## 2022-01-18 MED ORDER — DICYCLOMINE HCL 20 MG PO TABS: 20.0000 mg | ORAL_TABLET | Freq: Four times a day (QID) | ORAL | 3 refills | Status: DC | PRN

## 2022-01-18 NOTE — Telephone Encounter (Signed)
Okay to prescribe Bentyl 20 mg; #100; 1 p.o. every 6 hours as needed for cramping; 3 refills

## 2022-01-18 NOTE — Telephone Encounter (Signed)
Patient called states she is having a lot of diarrhea and she was wondering if she can be given something for it.

## 2022-01-18 NOTE — Telephone Encounter (Signed)
Pt stated that she has had chronic diarrhea throughout the years with IBS D. Pt stated that she feels that the imodium that she has been taking for so long is so longer working and she is having 5 to 6 diarrhea stools a day.  Pt stated that she took Bentyl years ago and is requesting  a prescription for this: Please review and Advise:

## 2022-01-18 NOTE — Telephone Encounter (Signed)
Pt made aware of Dr. Henrene Pastor recommendations: Prescription sent to pharmacy: Pt made aware: Pt verbalized understanding with all questions answered.

## 2022-02-25 IMAGING — DX DG CHEST 2V
2 series · 2 of 2 positions shown · non-contrast
Comparison: 08/10/2019

CLINICAL DATA: Productive, increased cough.

EXAM:
CHEST - 2 VIEW

[chest pa]
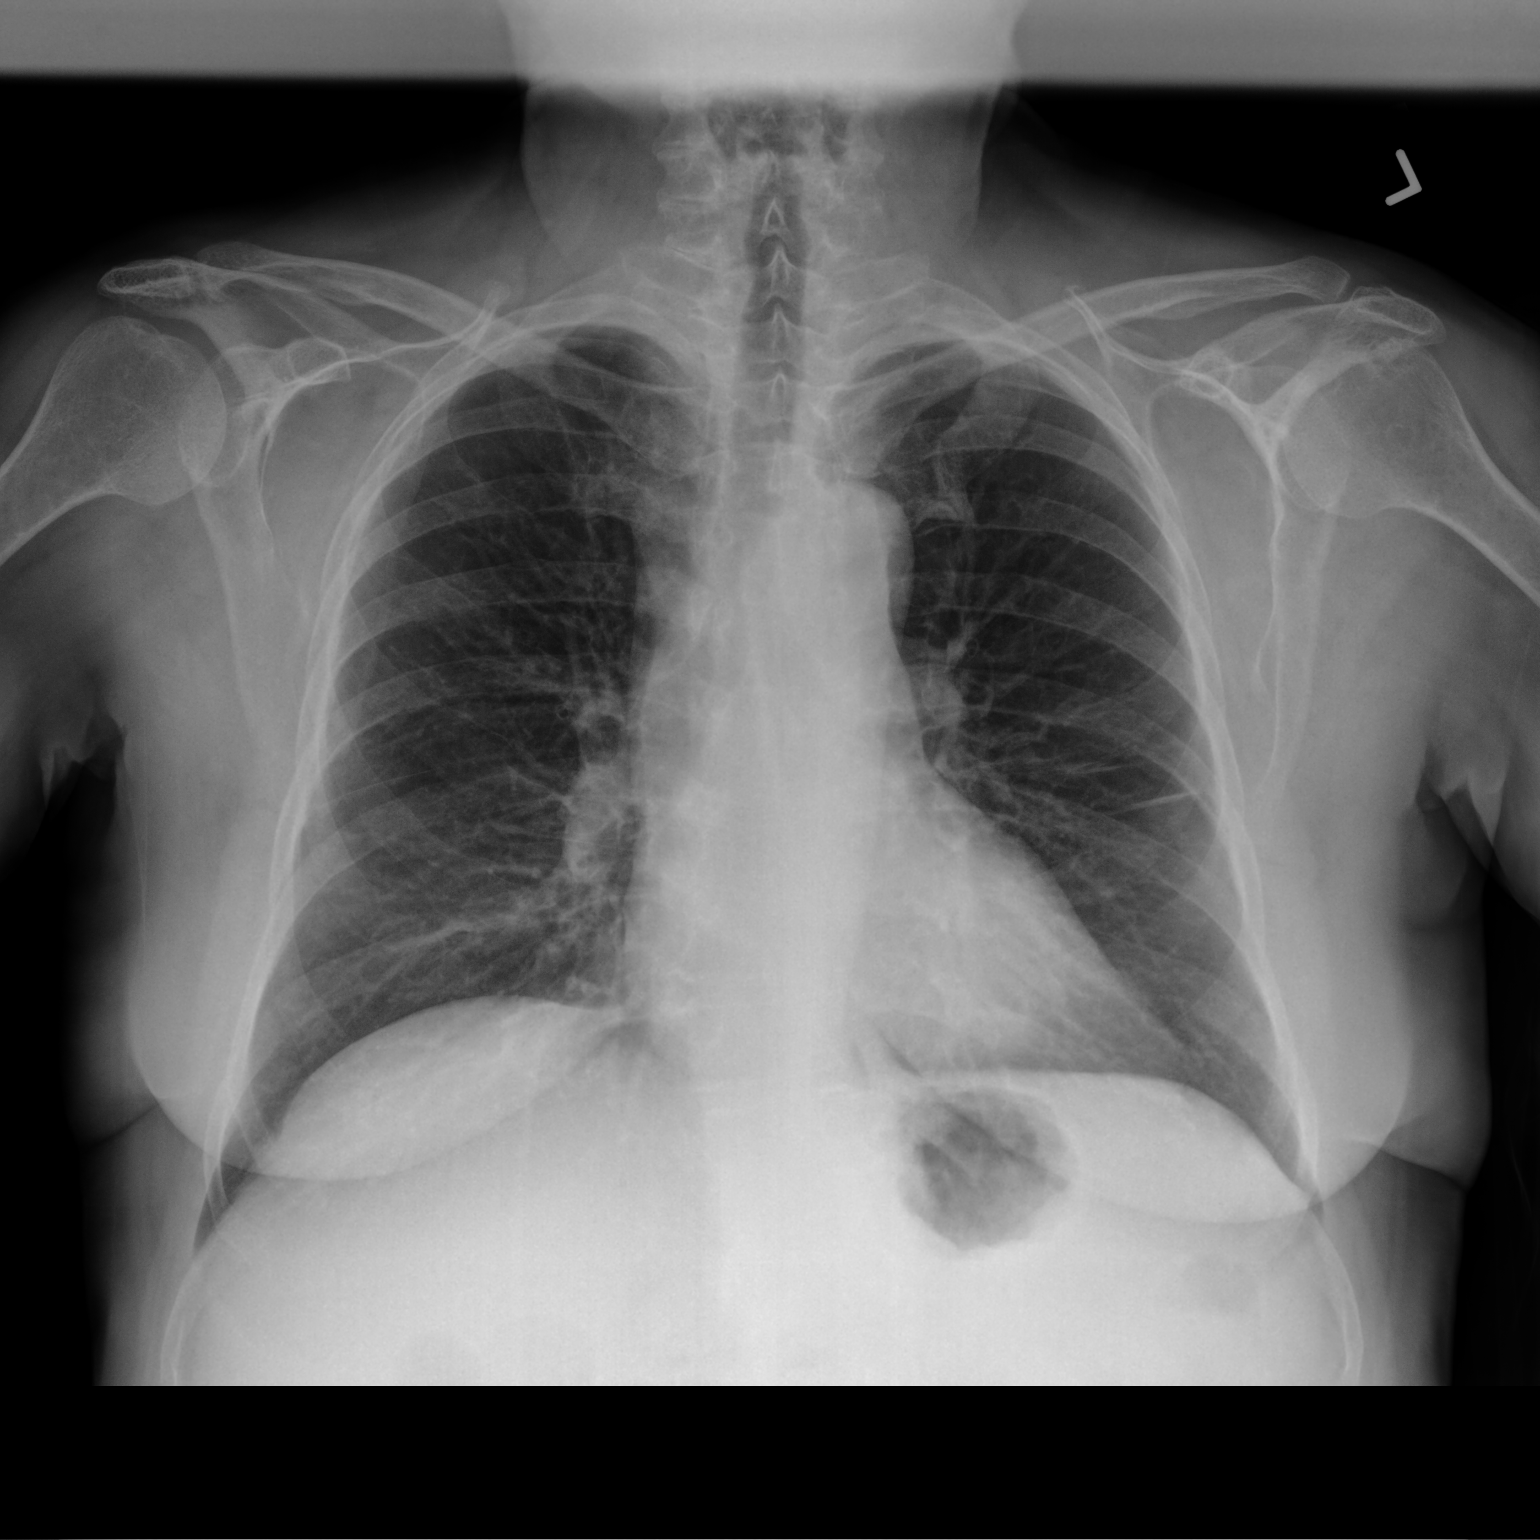

[chest lat]
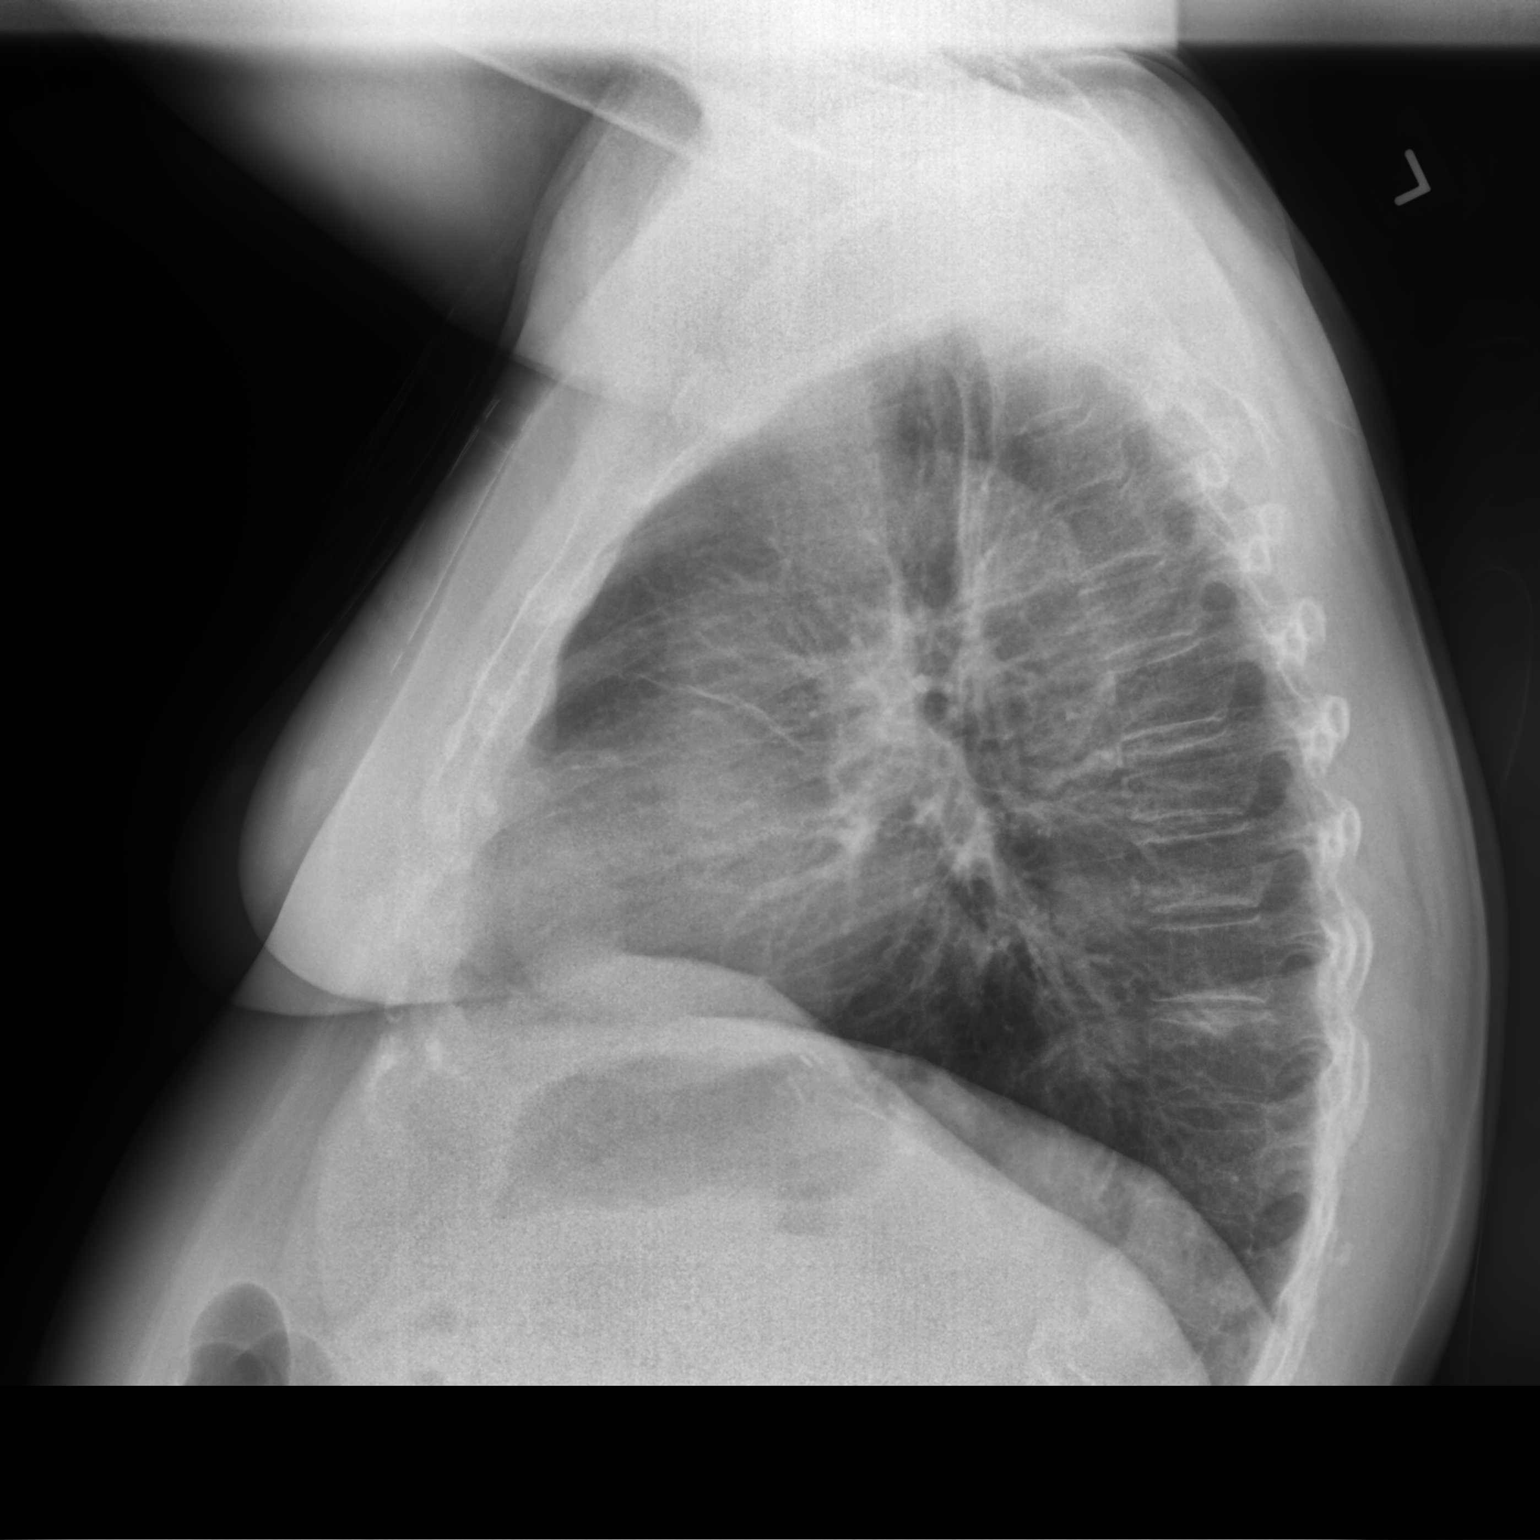

[2 of 2 positions shown; findings below may reference images not displayed]

FINDINGS: Heart size is normal. There is persistent perihilar peribronchial
thickening, similar in appearance to prior study. No new
consolidations or evidence for pulmonary edema. Degenerative changes
seen in the UPPER lumbar spine.
IMPRESSION: Similar, mild bronchitic changes.

## 2022-06-11 ENCOUNTER — Telehealth: Payer: Self-pay | Admitting: Allergy and Immunology

## 2022-06-11 NOTE — Telephone Encounter (Signed)
Spoke to patient about Dr. Bruna Potter note. Patient stated that she understood.

## 2022-06-11 NOTE — Telephone Encounter (Signed)
Tiffany [name] called in and states she has a urinary tract infection and stated that Dr. Marva Panda wanted to know if she can take Keflex.  If she can, he will prescribe it for her but wanted you to answer if she can or cant take it.  I made this high priority because she needs to start taking this today.  Please advise.

## 2022-07-22 ENCOUNTER — Other Ambulatory Visit: Payer: Self-pay | Admitting: Internal Medicine

## 2022-07-22 DIAGNOSIS — Z1231 Encounter for screening mammogram for malignant neoplasm of breast: Secondary | ICD-10-CM

## 2022-08-19 ENCOUNTER — Other Ambulatory Visit: Payer: Self-pay | Admitting: Internal Medicine

## 2022-09-16 ENCOUNTER — Ambulatory Visit
Admission: RE | Admit: 2022-09-16 | Discharge: 2022-09-16 | Disposition: A | Discharge status: Home / Self Care | Payer: Medicare Other | Source: Ambulatory Visit | Attending: Internal Medicine | Admitting: Internal Medicine

## 2022-09-16 DIAGNOSIS — Z1231 Encounter for screening mammogram for malignant neoplasm of breast: Secondary | ICD-10-CM

## 2022-10-02 ENCOUNTER — Other Ambulatory Visit: Payer: Self-pay

## 2022-10-02 MED ORDER — AMLODIPINE BESYLATE 5 MG PO TABS: 5.0000 mg | ORAL_TABLET | Freq: Every day | ORAL | 0 refills | Status: DC

## 2022-10-02 MED ORDER — AMLODIPINE BESYLATE 2.5 MG PO TABS: ORAL_TABLET | ORAL | 0 refills | Status: DC

## 2022-10-02 NOTE — Telephone Encounter (Signed)
Pt's medications were sent to pt's pharmacy as requested. Confirmation received.  

## 2022-10-04 ENCOUNTER — Other Ambulatory Visit: Payer: Self-pay | Admitting: Internal Medicine

## 2022-11-18 ENCOUNTER — Telehealth: Payer: Self-pay

## 2022-11-18 NOTE — Telephone Encounter (Signed)
Received paperwork from the pts PCP at [facility].Marland Kitchen and pt is due for follow up according to her recall... LM to make appt.

## 2023-04-06 NOTE — Progress Notes (Unsigned)
Cardiology Office Note   Date:  04/10/2023   ID:  Tiffany [name], DOB October 04, 1950, MRN [id number]  PCP:  Chilton Greathouse, MD  Cardiologist:   Dietrich Pates, MD   Pt presents for f/u of HTN ad palpitations    History of Present Illness: Tiffany [name] is a 72 y.o. female with a history of bronchiectaisis, HTN, spina bifida, achalasia, palpitations and CP  In 2016  Myovue was normal  She went on and had a L heart cath in 2019 that showed normal coronary arteries   LVEDP 17   Echo done after showed LVEF normal    Milld diastolic dysfunction Rx Lasix prn  I saw the pt in March 2023    Since seen she denies CP  Breathing is OK   No palpitations.  She does say she stays tired.    BP at home 130/70.    Outpatient Medications Prior to Visit  Medication Sig Dispense Refill   albuterol (VENTOLIN HFA) 108 (90 Base) MCG/ACT inhaler Inhale 1-2 puffs into the lungs every 6 (six) hours as needed for wheezing or shortness of breath. 8 g 2   amLODipine (NORVASC) 2.5 MG tablet Take one tablet by mouth daily in addition to your Amlodipine 5mg  for a total of 7.5mg  once daily 90 tablet 0   amLODipine (NORVASC) 5 MG tablet Take 1 tablet (5 mg total) by mouth daily. In addition to amlodipine 2.5 mg for a total of 7.5 mg daily. 90 tablet 0   atorvastatin (LIPITOR) 40 MG tablet Take 1 tablet (40 mg total) by mouth daily. 90 tablet 3   Blood Glucose Monitoring Suppl (ONETOUCH VERIO FLEX SYSTEM) w/Device KIT USE AS DIRECTED TO CHECK BLOOD SUGAR 2-3 TIMES DAILY. Dx: E11.29     Blood Glucose Monitoring Suppl (ONETOUCH VERIO REFLECT) w/Device KIT USE AS DIRECTED TO CHECK BLOOD SUGAR TWICE TO THREE TIMES DAILY (DX: ICD10: E11.65)     calcium carbonate (OS-CAL) 600 MG TABS tablet Take 600 mg by mouth 3 (three) times daily with meals.     Cholecalciferol (VITAMIN D PO) Take 5,000 Units by mouth daily.      diazepam (VALIUM) 10 MG tablet Take 10 mg by mouth at bedtime as needed for anxiety or sleep.       dicyclomine (BENTYL) 20 MG tablet Take 1 tablet (20 mg total) by mouth every 6 (six) hours as needed for spasms. 100 tablet 3   diphenhydrAMINE (BENADRYL) 25 mg capsule Take 25 mg by mouth every 6 (six) hours as needed for allergies.     DULoxetine (CYMBALTA) 60 MG capsule TAKE 1 CAPSULE BY MOUTH IN  THE EVENING WITH FOOD     EPINEPHrine 0.3 mg/0.3 mL IJ SOAJ injection Use as directed Injection for 30 days     estradiol (ESTRACE) 0.1 MG/GM vaginal cream SMARTSIG:1 Applicator Vaginal Every Other Day     fluticasone (FLONASE) 50 MCG/ACT nasal spray SHAKE LIQUID AND USE 1 SPRAY IN EACH NOSTRIL DAILY 16 g 5   furosemide (LASIX) 40 MG tablet Take 20 mg by mouth daily as needed for fluid or edema.      hydrALAZINE (APRESOLINE) 50 MG tablet TAKE 1 TABLET BY MOUTH 3 TIMES  DAILY 300 tablet 2   loperamide (IMODIUM) 2 MG capsule Take 2 mg by mouth as needed for diarrhea or loose stools.     loratadine (CLARITIN) 10 MG tablet Take 10 mg by mouth daily as needed for allergies.      Meclizine  HCl 25 MG CHEW take one tab by mouth every 6 hrs as needed for vertigo     metoprolol succinate (TOPROL-XL) 50 MG 24 hr tablet Take 1 tablet (50 mg total) by mouth 2 (two) times daily. Take with or immediately following a meal. 180 tablet 3   Polyethyl Glycol-Propyl Glycol (SYSTANE) 0.4-0.3 % GEL ophthalmic gel See admin instructions.     Polyethyl Glycol-Propyl Glycol 0.4-0.3 % SOLN Place 2 drops into both eyes 2 (two) times daily.      promethazine (PHENERGAN) 25 MG tablet Take 25 mg by mouth every 6 (six) hours as needed for nausea or vomiting.      sodium chloride HYPERTONIC 3 % nebulizer solution Take by nebulization 2 (two) times daily. 720 mL 3   metFORMIN (GLUCOPHAGE-XR) 500 MG 24 hr tablet 500 mg daily. (Patient not taking: Reported on 04/10/2023)     No facility-administered medications prior to visit.     Allergies:   Ambien [zolpidem tartrate], Aspirin, Benicar [olmesartan medoxomil], Codeine, Erythromycin,  Ketorolac, Ketorolac tromethamine, Latex, Lisinopril, Olmesartan, Other, Reclast [zoledronic acid], Toradol [ketorolac tromethamine], Zolpidem, Fenofibrate, Glucosamine, Glucosamine forte [nutritional supplements], Levaquin [levofloxacin in d5w], Levofloxacin, Percocet [oxycodone-acetaminophen], Tramadol, Atorvastatin, Lomotil [diphenoxylate-atropine], Metformin hcl, Penicillins, Tizanidine hcl, Venlafaxine, and Tizanidine   Past Medical History:  Diagnosis Date   Allergy    Anemia    pernicious anemia- recieved iron infusions until 10-2015   Anginal pain ([facility])    Anxiety    Arthritis    foot drop  lt foot  / toes   wears brace      Bronchiectasis ([facility])    Cancer ([facility])    SKIN     Cataract    REMOVED   Chronic cough    CKD (chronic kidney disease)    Colon polyps    Coronary artery calcification seen on CT scan 12/22/2017   CT in 2016   Depression    Diabetes mellitus    Dysphagia    Dysrhythmia    RAPID HEART BEAT , CHEST TIGHTNESS  09/29/14      Gastritis    GERD (gastroesophageal reflux disease)    Hiatal hernia    History of echocardiogram    Echo 5/19: Mild focal basal septal hypertrophy, EF 60-65, normal wall motion, grade 1 diastolic dysfunction, mildly dilated ascending aorta (38 mm), normal RVSF, mild TR, PASP 23   Hyperlipidemia    Hypertension    IBS (irritable bowel syndrome)    Insomnia    Kidney stones    KIDNEY DISEASE   STAGE lll     Kidney stones    Macular disorder    MACULAR DISEASE  RT EYE    Meningitis    Osteoporosis    Pneumonia    BRONCHIECTASIS   Shortness of breath dyspnea    WITH EXERTION   Sleep apnea    uses cpap   Spina bifida occulta    Ulcer     Past Surgical History:  Procedure Laterality Date   ABDOMINAL HYSTERECTOMY     BACK SURGERY     CATARACT EXTRACTION  03/2012   bilateral   COLONOSCOPY     eardrum     left   ruptured and repaired   ELBOW SURGERY     left   fundoplication     KNEE ARTHROSCOPY     rt knee    LEFT  HEART CATH AND CORONARY ANGIOGRAPHY N/A 12/25/2017   Procedure: LEFT HEART CATH AND CORONARY ANGIOGRAPHY;  Surgeon:  End, Cristal Deer, MD;  Location: [facility];  Service: Cardiovascular;  Laterality: N/A;   LITHOTRIPSY     MENISCUS REPAIR     POLYPECTOMY     TONSILLECTOMY     TOTAL KNEE ARTHROPLASTY Right 10/11/2014   Procedure: RIGHT TOTAL KNEE ARTHROPLASTY;  Surgeon: Kathryne Hitch, MD;  Location: [facility] OR;  Service: Orthopedics;  Laterality: Right;     Social History:  The patient  reports that she has never smoked. She has never used smokeless tobacco. She reports that she does not drink alcohol and does not use drugs.   Family History:  The patient's family history includes Alzheimer's disease in her father; Cancer in her mother; Colon cancer in her maternal grandmother and mother; Coronary artery disease in an other family member; Diabetes in her mother; Esophageal cancer in her cousin and cousin; Gout in her father; Heart attack (age of onset: 23) in her maternal grandmother; Heart attack (age of onset: 40) in her maternal grandfather; Hypertension in her mother; Irritable bowel syndrome in her father; Nephrolithiasis in her brother and mother; Ovarian cancer in her mother; Thyroid cancer in her sister; Ulcers in her father.    ROS:  Please see the history of present illness. All other systems are reviewed and  Negative to the above problem except as noted.    PHYSICAL EXAM: VS:  BP 132/70   Pulse 63   Ht 5\' 3"  (1.6 m)   Wt 184 lb 9.6 oz (83.7 kg)   SpO2 97%   BMI 32.70 kg/m   GEN:  Obese 72 yo female in NAD HEENT: normal  Neck: no JVD, No carotid bruit Cardiac: RRR; no murmur No LE edema Respiratory:  Moving air well  GI: soft, nontender, nondistended  No hepatomegaly    EKG:  EKG is ordered  NSR 63 bpm   Lipid Panel    Component Value Date/Time   CHOL 165 02/29/2016 0819   TRIG 279 (H) 02/29/2016 0819   HDL 42 (L) 02/29/2016 0819   CHOLHDL 3.9 02/29/2016  0819   VLDL 56 (H) 02/29/2016 0819   LDLCALC 67 02/29/2016 0819   LDLDIRECT 94.0 05/05/2015 1038      Wt Readings from Last 3 Encounters:  04/10/23 184 lb 9.6 oz (83.7 kg)  01/01/22 184 lb (83.5 kg)  11/29/21 175 lb (79.4 kg)      ASSESSMENT AND PLAN:  1  HTN   BP is controlled at home   FOllow    2  Palpitations  Pt denies palpitations   3 HL   Triglycerides have been high  Will repeat lipomed  5   Renal   Follow with J Coladonato   Last Cr in June 2024 2.1  6  Fatigue  I am not convinced it is cardiac in origin   Encouraged er to increase activity   F/U in 9 month.     Signed, Dietrich Pates, MD  04/10/2023 10:07 PM    [facility] [address] [place], [place], [place]  [address] Phone: [phone]; Fax: [phone]

## 2023-04-10 ENCOUNTER — Ambulatory Visit: Payer: Medicare Other | Attending: Internal Medicine | Admitting: Internal Medicine

## 2023-04-10 ENCOUNTER — Encounter: Payer: Self-pay | Admitting: Internal Medicine

## 2023-04-10 VITALS — BP 132/70 | HR 63 | Ht 63.0 in | Wt 184.6 lb

## 2023-04-10 DIAGNOSIS — I2510 Atherosclerotic heart disease of native coronary artery without angina pectoris: Secondary | ICD-10-CM

## 2023-04-10 DIAGNOSIS — I10 Essential (primary) hypertension: Secondary | ICD-10-CM

## 2023-04-10 NOTE — Patient Instructions (Signed)
Medication Instructions:   *If you need a refill on your cardiac medications before your next appointment, please call your pharmacy*   Lab Work: NMR, LIPO A, APO B  If you have labs (blood work) drawn today and your tests are completely normal, you will receive your results only by: MyChart Message (if you have MyChart) OR A paper copy in the mail If you have any lab test that is abnormal or we need to change your treatment, we will call you to review the results.   Testing/Procedures:    Follow-Up: At [facility], you and your health needs are our priority.  As part of our continuing mission to provide you with exceptional heart care, we have created designated Provider Care Teams.  These Care Teams include your primary Cardiologist (physician) and Advanced Practice Providers (APPs -  Physician Assistants and Nurse Practitioners) who all work together to provide you with the care you need, when you need it.  We recommend signing up for the patient portal called "MyChart".  Sign up information is provided on this After Visit Summary.  MyChart is used to connect with patients for Virtual Visits (Telemedicine).  Patients are able to view lab/test results, encounter notes, upcoming appointments, etc.  Non-urgent messages can be sent to your provider as well.   To learn more about what you can do with MyChart, go to [email or web address].    Your next appointment:   9 month(s)  Provider:   Dietrich Pates, MD     Other Instructions

## 2023-04-11 LAB — LIPOPROTEIN A (LPA): Lipoprotein (a): 11.6 nmol/L (ref <75.0)

## 2023-04-11 LAB — NMR, LIPOPROFILE
Cholesterol, Total: 246 mg/dL — ABNORMAL HIGH (ref 100–199)
HDL Particle Number: 36.7 umol/L (ref >=30.5)
HDL-C: 40 mg/dL (ref >39)
LDL Particle Number: 400 nmol/L (ref <1000)
LDL Size: 19.6 nm — ABNORMAL LOW (ref >20.5)
LDL-C (NIH Calc): 81 mg/dL (ref 0–99)
LP-IR Score: 64 — ABNORMAL HIGH (ref <=45)
Small LDL Particle Number: 312 nmol/L (ref <=527)
Triglycerides: 776 mg/dL (ref 0–149)

## 2023-04-11 LAB — APOLIPOPROTEIN B: Apolipoprotein B: 136 mg/dL — ABNORMAL HIGH (ref <90)

## 2023-04-16 ENCOUNTER — Telehealth: Payer: Self-pay | Admitting: Internal Medicine

## 2023-04-16 DIAGNOSIS — E785 Hyperlipidemia, unspecified: Secondary | ICD-10-CM

## 2023-04-16 MED ORDER — ICOSAPENT ETHYL 1 G PO CAPS
2.0000 g | ORAL_CAPSULE | Freq: Two times a day (BID) | ORAL | 1 refills | Status: DC
Start: 2023-04-16 — End: 2024-02-02

## 2023-04-16 NOTE — Telephone Encounter (Signed)
Discuss lab results with patient.  Per Dr. Tenny Craw: Triglycerides are severely elevated  Recomm trial of Vascepa 2 g   2x per day She cannot take Tricor Follow up lipomed panel in 8 wks  Lipomed ordered and lab appt scheduled for 06/12/23.  Vascepa 2g BID sent in to [facility] per patient request.  Patient asked if Dr. Tenny Craw had input on what might be causing her elevated triglycerides. Will forward to Dr. Tenny Craw.

## 2023-04-16 NOTE — Telephone Encounter (Signed)
-----   Message from [place] sent at 04/16/2023  4:25 PM EDT ----- Triglycerides are severely elevated  Recomm trial of Vascepa 2 g   2x per day She cannot take Tricor Follow up lipomed panel in 8 wks

## 2023-04-17 ENCOUNTER — Telehealth: Payer: Self-pay | Admitting: Internal Medicine

## 2023-04-17 NOTE — Telephone Encounter (Signed)
Pt c/o medication issue:  1. Name of Medication: Vascepa 2 g  2. How are you currently taking this medication (dosage and times per day)?   3. Are you having a reaction (difficulty breathing--STAT)?   4. What is your medication issue?  Patient called stating that yesterday they were going to call in Vascepa 2 g for her, she states that she looked it up online and it states that if you are allergic to shell fish you shouldn't take this medication. 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  She did find another medication she can take, she is not sure if Dr. Tenny Craw would want to prescribe it to her, it's called gemfibrozil.  If Dr. Tenny Craw is willing to prescribe it to her she would like it to go to [id number] - [place], [place] - [address].  She said she was going to call [facility] to cancel the script for vascepa so they won't fill it for her.

## 2023-04-18 ENCOUNTER — Other Ambulatory Visit: Payer: Self-pay

## 2023-04-18 DIAGNOSIS — Z79899 Other long term (current) drug therapy: Secondary | ICD-10-CM

## 2023-04-18 MED ORDER — GEMFIBROZIL 600 MG PO TABS: 600.0000 mg | ORAL_TABLET | Freq: Two times a day (BID) | ORAL | 3 refills | Status: DC

## 2023-04-18 NOTE — Telephone Encounter (Signed)
I apologize.  Missed seafood allergy I would try lopid 600 mg 2x per day Check lipomed and A1C in 8 wk with liver panel

## 2023-04-18 NOTE — Telephone Encounter (Signed)
Spoke with Pt. Told her that Dr. Tenny Craw apologized she missed the seafood allergy and told her the medication and lab recommendations. Pt stated understanding. Orders placed for medication and labs added to scheduled lab.

## 2023-04-29 ENCOUNTER — Telehealth: Payer: Self-pay | Admitting: Internal Medicine

## 2023-04-29 NOTE — Telephone Encounter (Signed)
Pt c/o medication issue:  1. Name of Medication: gemfibrozil (LOPID) 600 MG tablet   2. How are you currently taking this medication (dosage and times per day)?   Take 1 tablet (600 mg total) by mouth 2 (two) times daily before a meal.    3. Are you having a reaction (difficulty breathing--STAT)? no  4. What is your medication issue? It gave her diarrhea, itchy scalp and skin, small blisters on her arms (she did take benadryl).  She only took the medication from 8/24 to 04/24/23, she had stopped taking it.

## 2023-04-29 NOTE — Telephone Encounter (Signed)
Keep track of how feels.   Want all symtpoms to recover before considering other options Call back in a few wks    Add to allergy list

## 2023-04-29 NOTE — Telephone Encounter (Signed)
Spoke with pt who confirms information below.  Pt reports since stopping the medication diarrhea, itching and blistering has resolved.  Pt advised will forward to Dr Tenny Craw for review and any further recommendations.  Pt verbalizes understanding and thanked Char[employer] fundraiser for the callback.

## 2023-04-30 NOTE — Telephone Encounter (Signed)
Pt advised and will call back in 2 weeks to let us know how she is doing.

## 2023-05-06 ENCOUNTER — Other Ambulatory Visit: Payer: Self-pay | Admitting: Internal Medicine

## 2023-06-12 ENCOUNTER — Ambulatory Visit: Payer: Medicare Other | Attending: Internal Medicine

## 2023-06-12 DIAGNOSIS — Z79899 Other long term (current) drug therapy: Secondary | ICD-10-CM

## 2023-06-12 DIAGNOSIS — E785 Hyperlipidemia, unspecified: Secondary | ICD-10-CM

## 2023-06-13 LAB — HEPATIC FUNCTION PANEL
ALT: 12 [IU]/L (ref 0–32)
AST: 17 [IU]/L (ref 0–40)
Albumin: 4.4 g/dL (ref 3.8–4.8)
Alkaline Phosphatase: 123 [IU]/L — ABNORMAL HIGH (ref 44–121)
Bilirubin Total: 0.3 mg/dL (ref 0.0–1.2)
Bilirubin, Direct: <0.1 mg/dL (ref 0.00–0.40)
Total Protein: 7.1 g/dL (ref 6.0–8.5)

## 2023-06-13 LAB — HEMOGLOBIN A1C
Est. average glucose Bld gHb Est-mCnc: 163 mg/dL
Hgb A1c MFr Bld: 7.3 % — ABNORMAL HIGH (ref 4.8–5.6)

## 2023-06-13 LAB — NMR, LIPOPROFILE
Cholesterol, Total: 197 mg/dL (ref 100–199)
HDL Particle Number: 30.4 umol/L — ABNORMAL LOW (ref >=30.5)
HDL-C: 34 mg/dL — ABNORMAL LOW (ref >39)
LDL Particle Number: 1131 nmol/L — ABNORMAL HIGH (ref <1000)
LDL Size: 19.6 nmol — ABNORMAL LOW (ref >20.5)
LDL-C (NIH Calc): 84 mg/dL (ref 0–99)
LP-IR Score: 71 — ABNORMAL HIGH (ref <=45)
Small LDL Particle Number: 867 nmol/L — ABNORMAL HIGH (ref <=527)
Triglycerides: 485 mg/dL — ABNORMAL HIGH (ref 0–149)

## 2023-07-09 ENCOUNTER — Encounter (HOSPITAL_BASED_OUTPATIENT_CLINIC_OR_DEPARTMENT_OTHER): Payer: Self-pay | Admitting: Emergency Medicine

## 2023-07-09 ENCOUNTER — Emergency Department (HOSPITAL_BASED_OUTPATIENT_CLINIC_OR_DEPARTMENT_OTHER): Payer: Medicare Other | Admitting: Radiology

## 2023-07-09 ENCOUNTER — Emergency Department (HOSPITAL_BASED_OUTPATIENT_CLINIC_OR_DEPARTMENT_OTHER)
Admission: EM | Admit: 2023-07-09 | Discharge: 2023-07-09 | Disposition: A | Discharge status: Home / Self Care | Payer: Medicare Other | Attending: Emergency Medicine | Admitting: Emergency Medicine

## 2023-07-09 ENCOUNTER — Other Ambulatory Visit: Payer: Self-pay

## 2023-07-09 DIAGNOSIS — S42201A Unspecified fracture of upper end of right humerus, initial encounter for closed fracture: Secondary | ICD-10-CM | POA: Insufficient documentation

## 2023-07-09 DIAGNOSIS — W010XXA Fall on same level from slipping, tripping and stumbling without subsequent striking against object, initial encounter: Secondary | ICD-10-CM | POA: Insufficient documentation

## 2023-07-09 DIAGNOSIS — Y92007 Garden or yard of unspecified non-institutional (private) residence as the place of occurrence of the external cause: Secondary | ICD-10-CM | POA: Insufficient documentation

## 2023-07-09 DIAGNOSIS — S4990XA Unspecified injury of shoulder and upper arm, unspecified arm, initial encounter: Secondary | ICD-10-CM

## 2023-07-09 DIAGNOSIS — S4991XA Unspecified injury of right shoulder and upper arm, initial encounter: Secondary | ICD-10-CM | POA: Diagnosis present

## 2023-07-09 DIAGNOSIS — Z91040 Latex allergy status: Secondary | ICD-10-CM | POA: Diagnosis not present

## 2023-07-09 HISTORY — DX: Unspecified injury of shoulder and upper arm, unspecified arm, initial encounter: S49.90XA

## 2023-07-09 MED ORDER — ONDANSETRON 4 MG PO TBDP: 4.0000 mg | ORAL_TABLET | Freq: Three times a day (TID) | ORAL | 0 refills | Status: DC | PRN

## 2023-07-09 MED ORDER — HYDROCODONE-ACETAMINOPHEN 5-325 MG PO TABS: 1.0000 | ORAL_TABLET | Freq: Four times a day (QID) | ORAL | 0 refills | Status: DC | PRN

## 2023-07-09 MED ORDER — HYDROCODONE-ACETAMINOPHEN 5-325 MG PO TABS
1.0000 | ORAL_TABLET | Freq: Once | ORAL | Status: AC
  Administered 2023-07-09: 1 via ORAL
  Filled 2023-07-09: qty 1

## 2023-07-09 NOTE — ED Triage Notes (Signed)
Patient BIB [facility] c/o mechanical fall at home, landing on right side.  Patient c/o right shoulder pain.  Patient's arm in sling by EMS.  Patient denies hitting head, denies LOC, denies neck/back pain.  PMS intact below injury.

## 2023-07-09 NOTE — ED Provider Notes (Signed)
[place] EMERGENCY DEPARTMENT AT [facility]  Provider Note  CSN: [id number] Arrival date & time: 07/09/23 2158  History Chief Complaint  Patient presents with   Fall   Shoulder Pain    Tiffany [name] is a 72 y.o. female reports she tripped and fell in her yard earlier tonight, injuring her R shoulder. She is left handed. Denies any head injury or LOC. Complaining of pain, worse with movement. Brought by EMS, who placed a temporary sling.    Home Medications Prior to Admission medications   Medication Sig Start Date End Date Taking? Authorizing Provider  HYDROcodone-acetaminophen (NORCO/VICODIN) 5-325 MG tablet Take 1 tablet by mouth every 6 (six) hours as needed for severe pain (pain score 7-10). 07/09/23  Yes Pollyann Savoy, MD  ondansetron (ZOFRAN-ODT) 4 MG disintegrating tablet Take 1 tablet (4 mg total) by mouth every 8 (eight) hours as needed for nausea or vomiting. 07/09/23  Yes Pollyann Savoy, MD  albuterol (VENTOLIN HFA) 108 (90 Base) MCG/ACT inhaler Inhale 1-2 puffs into the lungs every 6 (six) hours as needed for wheezing or shortness of breath. 10/19/20   Parrett, Virgel Bouquet, NP  amLODipine (NORVASC) 2.5 MG tablet Take one tablet by mouth daily in addition to your Amlodipine 5mg  for a total of 7.5mg  once daily 10/02/22   Pricilla Riffle, MD  amLODipine (NORVASC) 5 MG tablet TAKE 1 AND 1/2 TABLETS BY MOUTH  DAILY 05/07/23   Pricilla Riffle, MD  atorvastatin (LIPITOR) 40 MG tablet Take 1 tablet (40 mg total) by mouth daily. 12/22/15   Pricilla Riffle, MD  Blood Glucose Monitoring Suppl (ONETOUCH VERIO FLEX SYSTEM) w/Device KIT USE AS DIRECTED TO CHECK BLOOD SUGAR 2-3 TIMES DAILY. Dx: E11.29 08/23/19   [provider]  Blood Glucose Monitoring Suppl (ONETOUCH VERIO REFLECT) w/Device KIT USE AS DIRECTED TO CHECK BLOOD SUGAR TWICE TO THREE TIMES DAILY (DX: ICD10: E11.65) 02/05/18   [provider]  calcium carbonate (OS-CAL) 600 MG TABS tablet Take 600  mg by mouth 3 (three) times daily with meals.    [provider]  Cholecalciferol (VITAMIN D PO) Take 5,000 Units by mouth daily.     [provider]  diazepam (VALIUM) 10 MG tablet Take 10 mg by mouth at bedtime as needed for anxiety or sleep.     [provider]  dicyclomine (BENTYL) 20 MG tablet Take 1 tablet (20 mg total) by mouth every 6 (six) hours as needed for spasms. 01/18/22   Hilarie Fredrickson, MD  diphenhydrAMINE (BENADRYL) 25 mg capsule Take 25 mg by mouth every 6 (six) hours as needed for allergies.    [provider]  DULoxetine (CYMBALTA) 60 MG capsule TAKE 1 CAPSULE BY MOUTH IN  THE EVENING WITH FOOD    [provider]  EPINEPHrine 0.3 mg/0.3 mL IJ SOAJ injection Use as directed Injection for 30 days 06/13/09   [provider]  estradiol (ESTRACE) 0.1 MG/GM vaginal cream SMARTSIG:1 Applicator Vaginal Every Other Day 12/19/21   [provider]  fluticasone (FLONASE) 50 MCG/ACT nasal spray SHAKE LIQUID AND USE 1 SPRAY IN EACH NOSTRIL DAILY 11/19/21   Cobb, Ruby Cola, NP  furosemide (LASIX) 40 MG tablet Take 20 mg by mouth daily as needed for fluid or edema.     [provider]  gemfibrozil (LOPID) 600 MG tablet Take 1 tablet (600 mg total) by mouth 2 (two) times daily before a meal. 04/18/23   Pricilla Riffle, MD  hydrALAZINE (APRESOLINE) 50 MG tablet TAKE 1 TABLET BY MOUTH 3 TIMES  DAILY 05/07/23   Pricilla Riffle, MD  icosapent Ethyl (VASCEPA) 1 g capsule Take 2 capsules (2 g total) by mouth 2 (two) times daily. 04/16/23   Pricilla Riffle, MD  loperamide (IMODIUM) 2 MG capsule Take 2 mg by mouth as needed for diarrhea or loose stools.    [provider]  loratadine (CLARITIN) 10 MG tablet Take 10 mg by mouth daily as needed for allergies.     [provider]  Meclizine HCl 25 MG CHEW take one tab by mouth every 6 hrs as needed for vertigo 09/09/19   [provider]  metFORMIN (GLUCOPHAGE-XR) 500 MG 24  hr tablet 500 mg daily. Patient not taking: Reported on 04/10/2023 04/23/19   [provider]  metoprolol succinate (TOPROL-XL) 50 MG 24 hr tablet Take 1 tablet (50 mg total) by mouth 2 (two) times daily. Take with or immediately following a meal. 08/07/18   Pricilla Riffle, MD  Polyethyl Glycol-Propyl Glycol (SYSTANE) 0.4-0.3 % GEL ophthalmic gel See admin instructions.    [provider]  Polyethyl Glycol-Propyl Glycol 0.4-0.3 % SOLN Place 2 drops into both eyes 2 (two) times daily.     [provider]  promethazine (PHENERGAN) 25 MG tablet Take 25 mg by mouth every 6 (six) hours as needed for nausea or vomiting.     [provider]  sodium chloride HYPERTONIC 3 % nebulizer solution Take by nebulization 2 (two) times daily. 09/10/21   Parrett, Virgel Bouquet, NP     Allergies    Ambien [zolpidem tartrate], Amoxicillin, Aspirin, Benicar [olmesartan medoxomil], Codeine, Erythromycin, Keflex [cephalexin], Ketorolac, Ketorolac tromethamine, Latex, Lisinopril, Olmesartan, Other, Reclast [zoledronic acid], Shellfish allergy, Toradol [ketorolac tromethamine], Zolpidem, Fenofibrate, Glucosamine, Glucosamine forte [nutritional supplements], Levaquin [levofloxacin in d5w], Levofloxacin, Percocet [oxycodone-acetaminophen], Tramadol, Atorvastatin, Lomotil [diphenoxylate-atropine], Lopid [gemfibrozil], Metformin hcl, Penicillins, Tizanidine hcl, Venlafaxine, and Tizanidine   Review of Systems   Review of Systems Please see HPI for pertinent positives and negatives  Physical Exam BP (!) 166/95 (BP Location: Left Arm)   Pulse 69   Temp 97.8 F (36.6 C)   Resp (!) 22   Wt 77.1 kg   SpO2 100%   BMI 30.11 kg/m   Physical Exam Vitals and nursing note reviewed.  Constitutional:      Appearance: Normal appearance.  HENT:     Head: Normocephalic and atraumatic.     Nose: Nose normal.     Mouth/Throat:     Mouth: Mucous membranes are moist.  Eyes:     Extraocular Movements:  Extraocular movements intact.     Conjunctiva/sclera: Conjunctivae normal.  Cardiovascular:     Rate and Rhythm: Normal rate.     Pulses: Normal pulses.  Pulmonary:     Effort: Pulmonary effort is normal.     Breath sounds: Normal breath sounds.  Abdominal:     General: Abdomen is flat.     Palpations: Abdomen is soft.     Tenderness: There is no abdominal tenderness.  Musculoskeletal:        General: Tenderness (R anterolateral shoulder) present. No swelling.     Cervical back: Neck supple.  Skin:    General: Skin is warm and dry.  Neurological:     General: No focal deficit present.     Mental Status: She is alert.  Psychiatric:        Mood and Affect: Mood normal.  ED Results / Procedures / Treatments   EKG None  Procedures Procedures  Medications Ordered in the ED Medications  HYDROcodone-acetaminophen (NORCO/VICODIN) 5-325 MG per tablet 1 tablet (has no administration in time range)    Initial Impression and Plan  Patient here after a mechanical fall and isolated R shoulder injury. I personally viewed the images from radiology studies and agree with radiologist interpretation: Xray shows a proximal humerus fracture. No dislocation. Plan sling, pain medications for comfort and outpatient Ortho follow up.   ED Course       MDM Rules/Calculators/A&P Medical Decision Making Problems Addressed: Closed fracture of proximal end of right humerus, unspecified fracture morphology, initial encounter: acute illness or injury  Amount and/or Complexity of Data Reviewed Radiology: ordered and independent interpretation performed. Decision-making details documented in ED Course.  Risk Prescription drug management.     Final Clinical Impression(s) / ED Diagnoses Final diagnoses:  Closed fracture of proximal end of right humerus, unspecified fracture morphology, initial encounter    Rx / DC Orders ED Discharge Orders          Ordered     HYDROcodone-acetaminophen (NORCO/VICODIN) 5-325 MG tablet  Every 6 hours PRN        07/09/23 2339    ondansetron (ZOFRAN-ODT) 4 MG disintegrating tablet  Every 8 hours PRN        07/09/23 2339             Pollyann Savoy, MD 07/09/23 2339

## 2023-07-10 NOTE — ED Notes (Signed)
 RN reviewed discharge instructions with pt. Pt verbalized understanding and had no further questions. VSS upon discharge.  

## 2023-07-14 ENCOUNTER — Encounter: Payer: Self-pay | Admitting: Physician Assistant

## 2023-07-14 ENCOUNTER — Ambulatory Visit: Payer: Medicare Other | Admitting: Physician Assistant

## 2023-07-14 DIAGNOSIS — S42291A Other displaced fracture of upper end of right humerus, initial encounter for closed fracture: Secondary | ICD-10-CM | POA: Diagnosis not present

## 2023-07-14 DIAGNOSIS — M25511 Pain in right shoulder: Secondary | ICD-10-CM | POA: Diagnosis not present

## 2023-07-14 NOTE — Progress Notes (Signed)
Office Visit Note   Patient: Tiffany [name]           Date of Birth: 05/24/1951           MRN: [id number] Visit Date: 07/14/2023              Requested by: Chilton Greathouse, MD [address] [place],  [place] [address] PCP: Chilton Greathouse, MD  Right shoulder pain    HPI: Patient is a pleasant 72 year old woman with a 5-day history of right shoulder pain.  She is left-hand dominant.  She said she was out in the yard at night and tripped over a pile of leaves.  She was seen and evaluated in the emergency room which x-rays indicated a displaced fracture through the greater tuberosity.  Denies any previous difficulties with her shoulder.  She is a retired Engineer, civil (consulting).  She has diabetes with a hemoglobin A1c of approximately 7.3.  She also has multiple drug allergies.  She has been taking hydrocodone with Zofran to help with the nausea  Assessment & Plan: Visit Diagnoses:  1. Acute pain of right shoulder   2. Humeral head fracture, right, closed, initial encounter     Plan: Patient is a pleasant 72 year old woman who is 5 days status post fall onto right shoulder.  She is seen evaluated in the emergency room x-rays demonstrated a displaced humeral head fracture through the greater tuberosity.  I did review the x-rays with Dr. August Saucer will go forward with a CT scan she should make an appointment with me in a week but will hopefully call her with the results and a plan forward.  At this point she should remain in the sling  Follow-Up Instructions: Return in about 3 days (around 07/17/2023).   Ortho Exam  Patient is alert, oriented, no adenopathy, well-dressed, normal affect, normal respiratory effort.  Her arm is immobilized in a sling.  Brisk capillary refill in all of her digits she appreciates sensation on the lateral aspect of her arm as well as her fingers grip strength is fair.  She has resolving ecchymosis into her axilla compartments are soft no cellulitis  Imaging: No results  found. No images are attached to the encounter.  Labs: Lab Results  Component Value Date   HGBA1C 7.3 (H) 06/12/2023   HGBA1C 6.0 (H) 11/10/2013   HGBA1C (H) 10/22/2010    8.2 (NOTE)                                                                       According to the ADA Clinical Practice Recommendations for 2011, when HbA1c is used as a screening test:   >=6.5%   Diagnostic of Diabetes Mellitus           (if abnormal result  is confirmed)  5.7-6.4%   Increased risk of developing Diabetes Mellitus  References:Diagnosis and Classification of Diabetes Mellitus,Diabetes Care,2011,34(Suppl 1):S62-S69 and Standards of Medical Care in         Diabetes - 2011,Diabetes Care,2011,34  (Suppl 1):S11-S61.   ESRSEDRATE 55 (H) 07/11/2009   REPTSTATUS 11/12/2013 FINAL 11/10/2013   REPTSTATUS 11/10/2013 FINAL 11/10/2013   GRAMSTAIN  10/18/2019    The sputum specimen submitted contains 25 or more squamous epithelial cells per low  power field and is unacceptable for culture due to oropharyngeal contamination. Please resubmit another specimen if clinically indicated.   CULT NO GROWTH Performed at [facility] 11/10/2013   LABORGA STAPHYLOCOCCUS AUREUS 07/11/2009     Lab Results  Component Value Date   ALBUMIN 4.4 06/12/2023   ALBUMIN 3.5 09/01/2021   ALBUMIN 4.3 09/03/2016    Lab Results  Component Value Date   MG 2.5 10/22/2010   No results found for: "VD25OH"  No results found for: "PREALBUMIN"    Latest Ref Rng & Units 09/01/2021    7:47 PM 11/27/2020    9:52 AM 05/01/2017   12:00 PM  CBC EXTENDED  WBC 4.0 - 10.5 K/uL 11.1  7.3  7.2   RBC 3.87 - 5.11 MIL/uL 3.40  3.78  3.82   Hemoglobin 12.0 - 15.0 g/dL 64.4  03.4  74.2   HCT 36.0 - 46.0 % 32.7  34.3  35.2   Platelets 150 - 400 K/uL 188  240  193.0   NEUT# 1.7 - 7.7 K/uL 9.0   4.4   Lymph# 0.7 - 4.0 K/uL 1.3   2.0      There is no height or weight on file to calculate BMI.  Orders:  No orders of the defined types were  placed in this encounter.  No orders of the defined types were placed in this encounter.    Procedures: No procedures performed  Clinical Data: No additional findings.  ROS:  All other systems negative, except as noted in the HPI. Review of Systems  All other systems reviewed and are negative.   Objective: Vital Signs: There were no vitals taken for this visit.  Specialty Comments:  No specialty comments available.  PMFS History: Patient Active Problem List   Diagnosis Date Noted   Pain in right shoulder 07/14/2023   Humeral head fracture, right, closed, initial encounter 07/14/2023   Seasonal allergies 09/07/2021   Acute bacterial rhinosinusitis 08/24/2021   Morbid obesity (HCC) 10/19/2020   Coronary artery calcification seen on CT scan 12/22/2017   Full incontinence of feces 09/09/2017   Balance problem 09/09/2017   Diarrhea 04/03/2016   Spina bifida of lumbar region without hydrocephalus (HCC) 03/07/2016   Mild obstructive sleep apnea 03/07/2016   Iron deficiency anemia 03/07/2016   Pica in adults 03/07/2016   Spina bifida of lumbosacral region without hydrocephalus (HCC) 08/16/2015   Nocturnal headaches 08/16/2015   Essential hypertension 08/16/2015   Osteoarthritis of right knee 10/11/2014   Status post total right knee replacement 10/11/2014   Primary snoring 07/05/2014   Bronchiectasis without complication (HCC) 12/29/2013   DM type 2 (diabetes mellitus, type 2) (HCC) 11/10/2013   History of colonic polyps 03/07/2008   ADENOMATOUS COLONIC POLYP 03/04/2008   ESOPHAGEAL STRICTURE 03/04/2008   GERD 03/04/2008   IBS 03/04/2008   Dysphagia, pharyngoesophageal phase 02/05/2008   Past Medical History:  Diagnosis Date   Allergy    Anemia    pernicious anemia- recieved iron infusions until 10-2015   Anginal pain (HCC)    Anxiety    Arthritis    foot drop  lt foot  / toes   wears brace      Bronchiectasis (HCC)    Cancer (HCC)    SKIN     Cataract     REMOVED   Chronic cough    CKD (chronic kidney disease)    Colon polyps    Coronary artery calcification seen on CT scan 12/22/2017   CT  in 2016   Depression    Diabetes mellitus    Dysphagia    Dysrhythmia    RAPID HEART BEAT , CHEST TIGHTNESS  09/29/14      Gastritis    GERD (gastroesophageal reflux disease)    Hiatal hernia    History of echocardiogram    Echo 5/19: Mild focal basal septal hypertrophy, EF 60-65, normal wall motion, grade 1 diastolic dysfunction, mildly dilated ascending aorta (38 mm), normal RVSF, mild TR, PASP 23   Hyperlipidemia    Hypertension    IBS (irritable bowel syndrome)    Insomnia    Kidney stones    KIDNEY DISEASE   STAGE lll     Kidney stones    Macular disorder    MACULAR DISEASE  RT EYE    Meningitis    Osteoporosis    Pneumonia    BRONCHIECTASIS   Shortness of breath dyspnea    WITH EXERTION   Sleep apnea    uses cpap   Spina bifida occulta    Ulcer     Family History  Problem Relation Age of Onset   Alzheimer's disease Father    Irritable bowel syndrome Father    Ulcers Father    Gout Father    Cancer Mother        ?   Diabetes Mother    Hypertension Mother    Nephrolithiasis Mother    Colon cancer Mother    Ovarian cancer Mother    Coronary artery disease Other        grandparent   Thyroid cancer Sister    Nephrolithiasis Brother    Colon cancer Maternal Grandmother    Heart attack Maternal Grandmother 82   Esophageal cancer Cousin    Esophageal cancer Cousin    Heart attack Maternal Grandfather 57   Colon polyps Neg Hx    Rectal cancer Neg Hx    Stomach cancer Neg Hx     Past Surgical History:  Procedure Laterality Date   ABDOMINAL HYSTERECTOMY     BACK SURGERY     CATARACT EXTRACTION  03/2012   bilateral   COLONOSCOPY     eardrum     left   ruptured and repaired   ELBOW SURGERY     left   fundoplication     KNEE ARTHROSCOPY     rt knee    LEFT HEART CATH AND CORONARY ANGIOGRAPHY N/A 12/25/2017   Procedure:  LEFT HEART CATH AND CORONARY ANGIOGRAPHY;  Surgeon: Yvonne Kendall, MD;  Location: [facility];  Service: Cardiovascular;  Laterality: N/A;   LITHOTRIPSY     MENISCUS REPAIR     POLYPECTOMY     TONSILLECTOMY     TOTAL KNEE ARTHROPLASTY Right 10/11/2014   Procedure: RIGHT TOTAL KNEE ARTHROPLASTY;  Surgeon: Kathryne Hitch, MD;  Location: [facility] OR;  Service: Orthopedics;  Laterality: Right;   Social History   Occupational History   Occupation: Retired    Comment: LPN  Tobacco Use   Smoking status: Never   Smokeless tobacco: Never  Vaping Use   Vaping status: Never Used  Substance and Sexual Activity   Alcohol use: No    Comment: OCC.   Drug use: No   Sexual activity: Not Currently

## 2023-07-14 NOTE — Addendum Note (Signed)
Addended by: Michaele Offer on: 07/14/2023 03:15 PM   Modules accepted: Orders

## 2023-07-15 ENCOUNTER — Inpatient Hospital Stay
Admission: RE | Admit: 2023-07-15 | Discharge: 2023-07-15 | Discharge status: Home / Self Care | Payer: Medicare Other | Source: Ambulatory Visit | Attending: Physician Assistant

## 2023-07-15 DIAGNOSIS — S42291A Other displaced fracture of upper end of right humerus, initial encounter for closed fracture: Secondary | ICD-10-CM

## 2023-07-15 DIAGNOSIS — M25511 Pain in right shoulder: Secondary | ICD-10-CM

## 2023-07-17 ENCOUNTER — Ambulatory Visit: Payer: Medicare Other | Admitting: Orthopedic Surgery

## 2023-07-17 ENCOUNTER — Telehealth: Payer: Self-pay | Admitting: Orthopedic Surgery

## 2023-07-17 DIAGNOSIS — S42291A Other displaced fracture of upper end of right humerus, initial encounter for closed fracture: Secondary | ICD-10-CM

## 2023-07-17 MED ORDER — HYDROCODONE-ACETAMINOPHEN 5-325 MG PO TABS: 1.0000 | ORAL_TABLET | Freq: Three times a day (TID) | ORAL | 0 refills | Status: DC | PRN

## 2023-07-17 NOTE — Telephone Encounter (Signed)
Pt needs to follow up next Wednesday at 8:15 per August Saucer please advise

## 2023-07-18 ENCOUNTER — Encounter: Payer: Self-pay | Admitting: Orthopedic Surgery

## 2023-07-18 NOTE — Progress Notes (Unsigned)
Office Visit Note   Patient: Tiffany [name]           Date of Birth: March 13, 1951           MRN: [id number] Visit Date: 07/17/2023 Requested by: Chilton Greathouse, MD [address] [place],  [place] [address] PCP: Chilton Greathouse, MD  Subjective: Chief Complaint  Patient presents with   Right Shoulder - Follow-up    Review scan    HPI: Tiffany [name] is a 72 y.o. female who presents to the office reporting right shoulder pain.  Date of injury 07/09/2023.  She had a fall outside in the backyard when she tripped over a tree stump.  She is 8 days out.  She is in a sling.  Had a CT scan.                ROS: All systems reviewed are negative as they relate to the chief complaint within the history of present illness.  Patient denies fevers or chills.  Assessment & Plan: Visit Diagnoses: No diagnosis found.  Plan: In which is reviewed today.  Follow-Up Instructions: No follow-ups on file.   Orders:  No orders of the defined types were placed in this encounter.  Meds ordered this encounter  Medications   HYDROcodone-acetaminophen (NORCO/VICODIN) 5-325 MG tablet    Sig: Take 1 tablet by mouth every 8 (eight) hours as needed for severe pain (pain score 7-10).    Dispense:  30 tablet    Refill:  0      Procedures: No procedures performed   Clinical Data: No additional findings.  Objective: Vital Signs: There were no vitals taken for this visit.  Physical Exam:  Constitutional: Patient appears well-developed HEENT:  Head: Normocephalic Eyes:EOM are normal Neck: Normal range of motion Cardiovascular: Normal rate Pulmonary/chest: Effort normal Neurologic: Patient is alert Skin: Skin is warm Psychiatric: Patient has normal mood and affect  Ortho Exam:  Ortho exam demonstrates palpable radial pulse and intact EPL FPL interosseous strength testing.  Shoulder is located.  No masses lymphadenopathy or skin changes noted in the shoulder girdle  region.    Specialty Comments:  No specialty comments available.  Imaging: No results found.   PMFS History: Patient Active Problem List   Diagnosis Date Noted   Pain in right shoulder 07/14/2023   Humeral head fracture, right, closed, initial encounter 07/14/2023   Seasonal allergies 09/07/2021   Acute bacterial rhinosinusitis 08/24/2021   Morbid obesity (HCC) 10/19/2020   Coronary artery calcification seen on CT scan 12/22/2017   Full incontinence of feces 09/09/2017   Balance problem 09/09/2017   Diarrhea 04/03/2016   Spina bifida of lumbar region without hydrocephalus (HCC) 03/07/2016   Mild obstructive sleep apnea 03/07/2016   Iron deficiency anemia 03/07/2016   Pica in adults 03/07/2016   Spina bifida of lumbosacral region without hydrocephalus (HCC) 08/16/2015   Nocturnal headaches 08/16/2015   Essential hypertension 08/16/2015   Osteoarthritis of right knee 10/11/2014   Status post total right knee replacement 10/11/2014   Primary snoring 07/05/2014   Bronchiectasis without complication (HCC) 12/29/2013   DM type 2 (diabetes mellitus, type 2) (HCC) 11/10/2013   History of colonic polyps 03/07/2008   ADENOMATOUS COLONIC POLYP 03/04/2008   ESOPHAGEAL STRICTURE 03/04/2008   GERD 03/04/2008   IBS 03/04/2008   Dysphagia, pharyngoesophageal phase 02/05/2008   Past Medical History:  Diagnosis Date   Allergy    Anemia    pernicious anemia- recieved iron infusions until 10-2015  Anginal pain (HCC)    Anxiety    Arthritis    foot drop  lt foot  / toes   wears brace      Bronchiectasis (HCC)    Cancer (HCC)    SKIN     Cataract    REMOVED   Chronic cough    CKD (chronic kidney disease)    Colon polyps    Coronary artery calcification seen on CT scan 12/22/2017   CT in 2016   Depression    Diabetes mellitus    Dysphagia    Dysrhythmia    RAPID HEART BEAT , CHEST TIGHTNESS  09/29/14      Gastritis    GERD (gastroesophageal reflux disease)    Hiatal hernia     History of echocardiogram    Echo 5/19: Mild focal basal septal hypertrophy, EF 60-65, normal wall motion, grade 1 diastolic dysfunction, mildly dilated ascending aorta (38 mm), normal RVSF, mild TR, PASP 23   Hyperlipidemia    Hypertension    IBS (irritable bowel syndrome)    Insomnia    Kidney stones    KIDNEY DISEASE   STAGE lll     Kidney stones    Macular disorder    MACULAR DISEASE  RT EYE    Meningitis    Osteoporosis    Pneumonia    BRONCHIECTASIS   Shortness of breath dyspnea    WITH EXERTION   Sleep apnea    uses cpap   Spina bifida occulta    Ulcer     Family History  Problem Relation Age of Onset   Alzheimer's disease Father    Irritable bowel syndrome Father    Ulcers Father    Gout Father    Cancer Mother        ?   Diabetes Mother    Hypertension Mother    Nephrolithiasis Mother    Colon cancer Mother    Ovarian cancer Mother    Coronary artery disease Other        grandparent   Thyroid cancer Sister    Nephrolithiasis Brother    Colon cancer Maternal Grandmother    Heart attack Maternal Grandmother 76   Esophageal cancer Cousin    Esophageal cancer Cousin    Heart attack Maternal Grandfather 2   Colon polyps Neg Hx    Rectal cancer Neg Hx    Stomach cancer Neg Hx     Past Surgical History:  Procedure Laterality Date   ABDOMINAL HYSTERECTOMY     BACK SURGERY     CATARACT EXTRACTION  03/2012   bilateral   COLONOSCOPY     eardrum     left   ruptured and repaired   ELBOW SURGERY     left   fundoplication     KNEE ARTHROSCOPY     rt knee    LEFT HEART CATH AND CORONARY ANGIOGRAPHY N/A 12/25/2017   Procedure: LEFT HEART CATH AND CORONARY ANGIOGRAPHY;  Surgeon: Yvonne Kendall, MD;  Location: [facility];  Service: Cardiovascular;  Laterality: N/A;   LITHOTRIPSY     MENISCUS REPAIR     POLYPECTOMY     TONSILLECTOMY     TOTAL KNEE ARTHROPLASTY Right 10/11/2014   Procedure: RIGHT TOTAL KNEE ARTHROPLASTY;  Surgeon: Kathryne Hitch, MD;  Location: [facility] OR;  Service: Orthopedics;  Laterality: Right;   Social History   Occupational History   Occupation: Retired    Comment: LPN  Tobacco Use   Smoking  status: Never   Smokeless tobacco: Never  Vaping Use   Vaping status: Never Used  Substance and Sexual Activity   Alcohol use: No    Comment: OCC.   Drug use: No   Sexual activity: Not Currently

## 2023-07-23 ENCOUNTER — Telehealth: Payer: Self-pay | Admitting: Orthopedic Surgery

## 2023-07-23 ENCOUNTER — Ambulatory Visit: Payer: Medicare Other | Admitting: Orthopedic Surgery

## 2023-07-23 ENCOUNTER — Other Ambulatory Visit: Payer: Self-pay

## 2023-07-23 DIAGNOSIS — S42291A Other displaced fracture of upper end of right humerus, initial encounter for closed fracture: Secondary | ICD-10-CM

## 2023-07-23 NOTE — Telephone Encounter (Signed)
Pt needs 1 week follow up appt would like next Wednesday early morning

## 2023-07-23 NOTE — Telephone Encounter (Signed)
Called and worked in

## 2023-07-23 NOTE — Progress Notes (Unsigned)
Post-Op Visit Note   Patient: Tiffany [name]           Date of Birth: 01-17-1951           MRN: [id number] Visit Date: 07/23/2023 PCP: Chilton Greathouse, MD   Assessment & Plan:  Chief Complaint:  Chief Complaint  Patient presents with   Right Shoulder - Injury   Visit Diagnoses:  1. Humeral head fracture, right, closed, initial encounter     Plan: Patient is here for follow-up of right shoulder proximal humerus fracture.  Date of injury 07/09/2023.  Taking hydrocodone for pain.  Sleeping in a recliner.  Does have a history of osteoporosis and takes Reclast.  On examination today I think the deltoid does fire.  Swelling is improving.  Radiographs show no change in fracture alignment.  Would like for her to come back in 7 days for clinical examination out of the sling to assess fracture stability and we will likely initiate some pendulum exercises at that time and then go back in the sling.  Anticipate discontinuing the sling at 6 weeks and starting overhead range of motion at that time.  Follow-Up Instructions: No follow-ups on file.   Orders:  Orders Placed This Encounter  Procedures   XR Shoulder Right   No orders of the defined types were placed in this encounter.   Imaging: No results found.  PMFS History: Patient Active Problem List   Diagnosis Date Noted   Pain in right shoulder 07/14/2023   Humeral head fracture, right, closed, initial encounter 07/14/2023   Seasonal allergies 09/07/2021   Acute bacterial rhinosinusitis 08/24/2021   Morbid obesity (HCC) 10/19/2020   Coronary artery calcification seen on CT scan 12/22/2017   Full incontinence of feces 09/09/2017   Balance problem 09/09/2017   Diarrhea 04/03/2016   Spina bifida of lumbar region without hydrocephalus (HCC) 03/07/2016   Mild obstructive sleep apnea 03/07/2016   Iron deficiency anemia 03/07/2016   Pica in adults 03/07/2016   Spina bifida of lumbosacral region without hydrocephalus (HCC)  08/16/2015   Nocturnal headaches 08/16/2015   Essential hypertension 08/16/2015   Osteoarthritis of right knee 10/11/2014   Status post total right knee replacement 10/11/2014   Primary snoring 07/05/2014   Bronchiectasis without complication (HCC) 12/29/2013   DM type 2 (diabetes mellitus, type 2) (HCC) 11/10/2013   History of colonic polyps 03/07/2008   ADENOMATOUS COLONIC POLYP 03/04/2008   ESOPHAGEAL STRICTURE 03/04/2008   GERD 03/04/2008   IBS 03/04/2008   Dysphagia, pharyngoesophageal phase 02/05/2008   Past Medical History:  Diagnosis Date   Allergy    Anemia    pernicious anemia- recieved iron infusions until 10-2015   Anginal pain (HCC)    Anxiety    Arthritis    foot drop  lt foot  / toes   wears brace      Bronchiectasis (HCC)    Cancer (HCC)    SKIN     Cataract    REMOVED   Chronic cough    CKD (chronic kidney disease)    Colon polyps    Coronary artery calcification seen on CT scan 12/22/2017   CT in 2016   Depression    Diabetes mellitus    Dysphagia    Dysrhythmia    RAPID HEART BEAT , CHEST TIGHTNESS  09/29/14      Gastritis    GERD (gastroesophageal reflux disease)    Hiatal hernia    History of echocardiogram    Echo 5/19: Mild  focal basal septal hypertrophy, EF 60-65, normal wall motion, grade 1 diastolic dysfunction, mildly dilated ascending aorta (38 mm), normal RVSF, mild TR, PASP 23   Hyperlipidemia    Hypertension    IBS (irritable bowel syndrome)    Insomnia    Kidney stones    KIDNEY DISEASE   STAGE lll     Kidney stones    Macular disorder    MACULAR DISEASE  RT EYE    Meningitis    Osteoporosis    Pneumonia    BRONCHIECTASIS   Shortness of breath dyspnea    WITH EXERTION   Sleep apnea    uses cpap   Spina bifida occulta    Ulcer     Family History  Problem Relation Age of Onset   Alzheimer's disease Father    Irritable bowel syndrome Father    Ulcers Father    Gout Father    Cancer Mother        ?   Diabetes Mother     Hypertension Mother    Nephrolithiasis Mother    Colon cancer Mother    Ovarian cancer Mother    Coronary artery disease Other        grandparent   Thyroid cancer Sister    Nephrolithiasis Brother    Colon cancer Maternal Grandmother    Heart attack Maternal Grandmother 84   Esophageal cancer Cousin    Esophageal cancer Cousin    Heart attack Maternal Grandfather 45   Colon polyps Neg Hx    Rectal cancer Neg Hx    Stomach cancer Neg Hx     Past Surgical History:  Procedure Laterality Date   ABDOMINAL HYSTERECTOMY     BACK SURGERY     CATARACT EXTRACTION  03/2012   bilateral   COLONOSCOPY     eardrum     left   ruptured and repaired   ELBOW SURGERY     left   fundoplication     KNEE ARTHROSCOPY     rt knee    LEFT HEART CATH AND CORONARY ANGIOGRAPHY N/A 12/25/2017   Procedure: LEFT HEART CATH AND CORONARY ANGIOGRAPHY;  Surgeon: Yvonne Kendall, MD;  Location: [facility];  Service: Cardiovascular;  Laterality: N/A;   LITHOTRIPSY     MENISCUS REPAIR     POLYPECTOMY     TONSILLECTOMY     TOTAL KNEE ARTHROPLASTY Right 10/11/2014   Procedure: RIGHT TOTAL KNEE ARTHROPLASTY;  Surgeon: Kathryne Hitch, MD;  Location: [facility] OR;  Service: Orthopedics;  Laterality: Right;   Social History   Occupational History   Occupation: Retired    Comment: LPN  Tobacco Use   Smoking status: Never   Smokeless tobacco: Never  Vaping Use   Vaping status: Never Used  Substance and Sexual Activity   Alcohol use: No    Comment: OCC.   Drug use: No   Sexual activity: Not Currently

## 2023-07-23 NOTE — Telephone Encounter (Signed)
Yeah sure thing

## 2023-07-30 ENCOUNTER — Other Ambulatory Visit (INDEPENDENT_AMBULATORY_CARE_PROVIDER_SITE_OTHER): Payer: Medicare Other

## 2023-07-30 ENCOUNTER — Ambulatory Visit: Payer: Medicare Other | Admitting: Surgical

## 2023-07-30 ENCOUNTER — Encounter: Payer: Self-pay | Admitting: Orthopedic Surgery

## 2023-07-30 DIAGNOSIS — S42291A Other displaced fracture of upper end of right humerus, initial encounter for closed fracture: Secondary | ICD-10-CM | POA: Diagnosis not present

## 2023-07-30 NOTE — Progress Notes (Signed)
Post-Op Visit Note   Patient: Tiffany [name]           Date of Birth: 07-24-1951           MRN: [id number] Visit Date: 07/30/2023 PCP: Chilton Greathouse, MD   Assessment & Plan:  Chief Complaint:  Chief Complaint  Patient presents with   Right Shoulder - Follow-up, Fracture    DOI: 07/09/23   Visit Diagnoses:  1. Humeral head fracture, right, closed, initial encounter     Plan: Patient is a 72 year old female who presents for evaluation of proximal humerus fracture.  Date of injury 07/09/2023.  She states that she has some good days and bad days.  Does have increased pain today.  Taking hydrocodone on average about twice daily.  Has been in the sling and coming out once a day in order to straighten her elbow.  Not lifting anything with the arm.  No numbness or tingling.  On exam, patient has intact EPL, FPL, finger abduction, pronation/supination, bicep, tricep, deltoid.  Axillary nerve intact with deltoid firing and sensation intact over the lateral deltoid.  Passive motion gently of the injured shoulder demonstrates no significant crepitus and feels like fracture is moving as 1 unit.  Plan is to continue with 5000 units vitamin D daily.  Remain in sling but okay to come out now once daily in order to straighten the elbow and do pendulum exercises 30 repetitions counterclockwise and 30 repetitions clockwise.  After that she will return back into the sling.  Follow-up in 2 weeks for clinical recheck with new radiographs at that time and likely progression of shoulder mobility.  Follow-Up Instructions: Return in about 2 weeks (around 08/13/2023).   Orders:  Orders Placed This Encounter  Procedures   XR Shoulder Right   No orders of the defined types were placed in this encounter.   Imaging: XR Shoulder Right  Result Date: 07/30/2023 AP and scapular Y view of right shoulder reviewed.  Right shoulder proximal humerus fracture again noted with no significant change in  position compared with previous radiographs.  No interval fracture/dislocation.  No callus formation noted yet.   PMFS History: Patient Active Problem List   Diagnosis Date Noted   Pain in right shoulder 07/14/2023   Humeral head fracture, right, closed, initial encounter 07/14/2023   Seasonal allergies 09/07/2021   Acute bacterial rhinosinusitis 08/24/2021   Morbid obesity (HCC) 10/19/2020   Coronary artery calcification seen on CT scan 12/22/2017   Full incontinence of feces 09/09/2017   Balance problem 09/09/2017   Diarrhea 04/03/2016   Spina bifida of lumbar region without hydrocephalus (HCC) 03/07/2016   Mild obstructive sleep apnea 03/07/2016   Iron deficiency anemia 03/07/2016   Pica in adults 03/07/2016   Spina bifida of lumbosacral region without hydrocephalus (HCC) 08/16/2015   Nocturnal headaches 08/16/2015   Essential hypertension 08/16/2015   Osteoarthritis of right knee 10/11/2014   Status post total right knee replacement 10/11/2014   Primary snoring 07/05/2014   Bronchiectasis without complication (HCC) 12/29/2013   DM type 2 (diabetes mellitus, type 2) (HCC) 11/10/2013   History of colonic polyps 03/07/2008   ADENOMATOUS COLONIC POLYP 03/04/2008   ESOPHAGEAL STRICTURE 03/04/2008   GERD 03/04/2008   IBS 03/04/2008   Dysphagia, pharyngoesophageal phase 02/05/2008   Past Medical History:  Diagnosis Date   Allergy    Anemia    pernicious anemia- recieved iron infusions until 10-2015   Anginal pain (HCC)    Anxiety    Arthritis  foot drop  lt foot  / toes   wears brace      Bronchiectasis (HCC)    Cancer (HCC)    SKIN     Cataract    REMOVED   Chronic cough    CKD (chronic kidney disease)    Colon polyps    Coronary artery calcification seen on CT scan 12/22/2017   CT in 2016   Depression    Diabetes mellitus    Dysphagia    Dysrhythmia    RAPID HEART BEAT , CHEST TIGHTNESS  09/29/14      Gastritis    GERD (gastroesophageal reflux disease)     Hiatal hernia    History of echocardiogram    Echo 5/19: Mild focal basal septal hypertrophy, EF 60-65, normal wall motion, grade 1 diastolic dysfunction, mildly dilated ascending aorta (38 mm), normal RVSF, mild TR, PASP 23   Hyperlipidemia    Hypertension    IBS (irritable bowel syndrome)    Insomnia    Kidney stones    KIDNEY DISEASE   STAGE lll     Kidney stones    Macular disorder    MACULAR DISEASE  RT EYE    Meningitis    Osteoporosis    Pneumonia    BRONCHIECTASIS   Shortness of breath dyspnea    WITH EXERTION   Sleep apnea    uses cpap   Spina bifida occulta    Ulcer     Family History  Problem Relation Age of Onset   Alzheimer's disease Father    Irritable bowel syndrome Father    Ulcers Father    Gout Father    Cancer Mother        ?   Diabetes Mother    Hypertension Mother    Nephrolithiasis Mother    Colon cancer Mother    Ovarian cancer Mother    Coronary artery disease Other        grandparent   Thyroid cancer Sister    Nephrolithiasis Brother    Colon cancer Maternal Grandmother    Heart attack Maternal Grandmother 71   Esophageal cancer Cousin    Esophageal cancer Cousin    Heart attack Maternal Grandfather 30   Colon polyps Neg Hx    Rectal cancer Neg Hx    Stomach cancer Neg Hx     Past Surgical History:  Procedure Laterality Date   ABDOMINAL HYSTERECTOMY     BACK SURGERY     CATARACT EXTRACTION  03/2012   bilateral   COLONOSCOPY     eardrum     left   ruptured and repaired   ELBOW SURGERY     left   fundoplication     KNEE ARTHROSCOPY     rt knee    LEFT HEART CATH AND CORONARY ANGIOGRAPHY N/A 12/25/2017   Procedure: LEFT HEART CATH AND CORONARY ANGIOGRAPHY;  Surgeon: Yvonne Kendall, MD;  Location: [facility];  Service: Cardiovascular;  Laterality: N/A;   LITHOTRIPSY     MENISCUS REPAIR     POLYPECTOMY     TONSILLECTOMY     TOTAL KNEE ARTHROPLASTY Right 10/11/2014   Procedure: RIGHT TOTAL KNEE ARTHROPLASTY;  Surgeon:  Kathryne Hitch, MD;  Location: [facility] OR;  Service: Orthopedics;  Laterality: Right;   Social History   Occupational History   Occupation: Retired    Comment: LPN  Tobacco Use   Smoking status: Never   Smokeless tobacco: Never  Vaping Use   Vaping status:  Never Used  Substance and Sexual Activity   Alcohol use: No    Comment: OCC.   Drug use: No   Sexual activity: Not Currently

## 2023-07-31 ENCOUNTER — Telehealth: Payer: Self-pay | Admitting: Orthopedic Surgery

## 2023-07-31 ENCOUNTER — Other Ambulatory Visit: Payer: Self-pay | Admitting: Surgical

## 2023-07-31 MED ORDER — HYDROCODONE-ACETAMINOPHEN 5-325 MG PO TABS: 1.0000 | ORAL_TABLET | Freq: Three times a day (TID) | ORAL | 0 refills | Status: DC | PRN

## 2023-07-31 MED ORDER — ONDANSETRON 4 MG PO TBDP: 4.0000 mg | ORAL_TABLET | Freq: Three times a day (TID) | ORAL | 0 refills | Status: AC | PRN

## 2023-07-31 NOTE — Telephone Encounter (Signed)
refilled 

## 2023-07-31 NOTE — Telephone Encounter (Signed)
Pt called requesting refill of hydrocodone and ondansetron. Please send to [facility] on [address]Pt phone number is [phone].

## 2023-07-31 NOTE — Telephone Encounter (Signed)
Notified patient.

## 2023-08-13 ENCOUNTER — Ambulatory Visit: Payer: Medicare Other | Admitting: Orthopedic Surgery

## 2023-08-13 ENCOUNTER — Encounter: Payer: Self-pay | Admitting: Orthopedic Surgery

## 2023-08-13 ENCOUNTER — Other Ambulatory Visit (INDEPENDENT_AMBULATORY_CARE_PROVIDER_SITE_OTHER): Payer: Medicare Other

## 2023-08-13 DIAGNOSIS — S42291A Other displaced fracture of upper end of right humerus, initial encounter for closed fracture: Secondary | ICD-10-CM

## 2023-08-13 NOTE — Progress Notes (Signed)
Post-Op Visit Note   Patient: Tiffany [name]           Date of Birth: 15-Nov-1950           MRN: [id number] Visit Date: 08/13/2023 PCP: Chilton Greathouse, MD   Assessment & Plan:  Chief Complaint:  Chief Complaint  Patient presents with   Right Shoulder - Fracture, Follow-up     DOI: 07/09/23      Visit Diagnoses:  1. Humeral head fracture, right, closed, initial encounter     Plan: The is a 72 year old patient who is now 5 weeks out right humeral head fracture.  Date of injury 07/09/2023.  She has been in the sling doing some pendulum exercises daily.  On exam she has some crepitus with range of motion which may be posttraumatic deformity or could be delayed union of the fragments.  Working to continue her current activity level and recheck her in 3 weeks.  She is taking vitamin D and calcium.  Do not want her doing any lifting with that right arm.  Radiographs do not show too much in terms of callus formation today around the fragments.  Follow-Up Instructions: No follow-ups on file.   Orders:  Orders Placed This Encounter  Procedures   XR Shoulder Right   No orders of the defined types were placed in this encounter.   Imaging: No results found.  PMFS History: Patient Active Problem List   Diagnosis Date Noted   Pain in right shoulder 07/14/2023   Humeral head fracture, right, closed, initial encounter 07/14/2023   Seasonal allergies 09/07/2021   Acute bacterial rhinosinusitis 08/24/2021   Morbid obesity (HCC) 10/19/2020   Coronary artery calcification seen on CT scan 12/22/2017   Full incontinence of feces 09/09/2017   Balance problem 09/09/2017   Diarrhea 04/03/2016   Spina bifida of lumbar region without hydrocephalus (HCC) 03/07/2016   Mild obstructive sleep apnea 03/07/2016   Iron deficiency anemia 03/07/2016   Pica in adults 03/07/2016   Spina bifida of lumbosacral region without hydrocephalus (HCC) 08/16/2015   Nocturnal headaches 08/16/2015    Essential hypertension 08/16/2015   Osteoarthritis of right knee 10/11/2014   Status post total right knee replacement 10/11/2014   Primary snoring 07/05/2014   Bronchiectasis without complication (HCC) 12/29/2013   DM type 2 (diabetes mellitus, type 2) (HCC) 11/10/2013   History of colonic polyps 03/07/2008   ADENOMATOUS COLONIC POLYP 03/04/2008   ESOPHAGEAL STRICTURE 03/04/2008   GERD 03/04/2008   IBS 03/04/2008   Dysphagia, pharyngoesophageal phase 02/05/2008   Past Medical History:  Diagnosis Date   Allergy    Anemia    pernicious anemia- recieved iron infusions until 10-2015   Anginal pain (HCC)    Anxiety    Arthritis    foot drop  lt foot  / toes   wears brace      Bronchiectasis (HCC)    Cancer (HCC)    SKIN     Cataract    REMOVED   Chronic cough    CKD (chronic kidney disease)    Colon polyps    Coronary artery calcification seen on CT scan 12/22/2017   CT in 2016   Depression    Diabetes mellitus    Dysphagia    Dysrhythmia    RAPID HEART BEAT , CHEST TIGHTNESS  09/29/14      Gastritis    GERD (gastroesophageal reflux disease)    Hiatal hernia    History of echocardiogram    Echo 5/19:  Mild focal basal septal hypertrophy, EF 60-65, normal wall motion, grade 1 diastolic dysfunction, mildly dilated ascending aorta (38 mm), normal RVSF, mild TR, PASP 23   Hyperlipidemia    Hypertension    IBS (irritable bowel syndrome)    Insomnia    Kidney stones    KIDNEY DISEASE   STAGE lll     Kidney stones    Macular disorder    MACULAR DISEASE  RT EYE    Meningitis    Osteoporosis    Pneumonia    BRONCHIECTASIS   Shortness of breath dyspnea    WITH EXERTION   Sleep apnea    uses cpap   Spina bifida occulta    Ulcer     Family History  Problem Relation Age of Onset   Alzheimer's disease Father    Irritable bowel syndrome Father    Ulcers Father    Gout Father    Cancer Mother        ?   Diabetes Mother    Hypertension Mother    Nephrolithiasis Mother     Colon cancer Mother    Ovarian cancer Mother    Coronary artery disease Other        grandparent   Thyroid cancer Sister    Nephrolithiasis Brother    Colon cancer Maternal Grandmother    Heart attack Maternal Grandmother 71   Esophageal cancer Cousin    Esophageal cancer Cousin    Heart attack Maternal Grandfather 23   Colon polyps Neg Hx    Rectal cancer Neg Hx    Stomach cancer Neg Hx     Past Surgical History:  Procedure Laterality Date   ABDOMINAL HYSTERECTOMY     BACK SURGERY     CATARACT EXTRACTION  03/2012   bilateral   COLONOSCOPY     eardrum     left   ruptured and repaired   ELBOW SURGERY     left   fundoplication     KNEE ARTHROSCOPY     rt knee    LEFT HEART CATH AND CORONARY ANGIOGRAPHY N/A 12/25/2017   Procedure: LEFT HEART CATH AND CORONARY ANGIOGRAPHY;  Surgeon: Yvonne Kendall, MD;  Location: [facility];  Service: Cardiovascular;  Laterality: N/A;   LITHOTRIPSY     MENISCUS REPAIR     POLYPECTOMY     TONSILLECTOMY     TOTAL KNEE ARTHROPLASTY Right 10/11/2014   Procedure: RIGHT TOTAL KNEE ARTHROPLASTY;  Surgeon: Kathryne Hitch, MD;  Location: [facility] OR;  Service: Orthopedics;  Laterality: Right;   Social History   Occupational History   Occupation: Retired    Comment: LPN  Tobacco Use   Smoking status: Never   Smokeless tobacco: Never  Vaping Use   Vaping status: Never Used  Substance and Sexual Activity   Alcohol use: No    Comment: OCC.   Drug use: No   Sexual activity: Not Currently

## 2023-09-03 ENCOUNTER — Other Ambulatory Visit (HOSPITAL_BASED_OUTPATIENT_CLINIC_OR_DEPARTMENT_OTHER): Payer: Self-pay

## 2023-09-03 MED ORDER — COVID-19 MRNA VAC-TRIS(PFIZER) 30 MCG/0.3ML IM SUSY
0.3000 mL | PREFILLED_SYRINGE | Freq: Once | INTRAMUSCULAR | 0 refills | Status: AC
  Filled 2023-09-03: qty 0.3, 1d supply, fill #0

## 2023-09-03 MED ORDER — INFLUENZA VAC A&B SURF ANT ADJ 0.5 ML IM SUSY
0.5000 mL | PREFILLED_SYRINGE | Freq: Once | INTRAMUSCULAR | 0 refills | Status: AC
  Filled 2023-09-03: qty 0.5, 1d supply, fill #0

## 2023-09-08 ENCOUNTER — Encounter: Payer: Self-pay | Admitting: Orthopedic Surgery

## 2023-09-08 ENCOUNTER — Other Ambulatory Visit (INDEPENDENT_AMBULATORY_CARE_PROVIDER_SITE_OTHER): Payer: Medicare Other

## 2023-09-08 ENCOUNTER — Ambulatory Visit: Payer: Medicare Other | Admitting: Orthopedic Surgery

## 2023-09-08 DIAGNOSIS — S42291A Other displaced fracture of upper end of right humerus, initial encounter for closed fracture: Secondary | ICD-10-CM | POA: Diagnosis not present

## 2023-09-09 ENCOUNTER — Encounter: Payer: Self-pay | Admitting: Orthopedic Surgery

## 2023-09-09 NOTE — Progress Notes (Signed)
 Post-Op Visit Note   Patient: Tiffany [name]           Date of Birth: 04/29/1951           MRN: [id number] Visit Date: 09/08/2023 PCP: Janey Santos, MD   Assessment & Plan:  Chief Complaint:  Chief Complaint  Patient presents with   Right Shoulder - Follow-up, Fracture    DOI: 07/09/23   Visit Diagnoses:  1. Humeral head fracture, right, closed, initial encounter     Plan: The is a patient underwent right proximal humerus fracture involving greater tuberosity.  On 07/13/2023.  Patient has been in a sling.  Pain is about the same.  She describes less grinding.  On examination the fracture does move as a unit.  Patient is apprehensive about motion.  Radiographs show no change in fracture alignment or position.  Callus formation has not been robust but clinically she is moving as a unit and there has been no change in fracture position or alignment compared to prior radiographs.  Plan at this time is to discontinue sling but no lifting with that right arm.  Start [place] physical therapy for range of motion exercises but no strengthening.  4-week return for clinical recheck and repeat radiographs.  Follow-Up Instructions: Return in about 4 weeks (around 10/06/2023).   Orders:  Orders Placed This Encounter  Procedures   XR Shoulder Right   No orders of the defined types were placed in this encounter.   Imaging: No results found.  PMFS History: Patient Active Problem List   Diagnosis Date Noted   Pain in right shoulder 07/14/2023   Humeral head fracture, right, closed, initial encounter 07/14/2023   Seasonal allergies 09/07/2021   Acute bacterial rhinosinusitis 08/24/2021   Morbid obesity (HCC) 10/19/2020   Coronary artery calcification seen on CT scan 12/22/2017   Full incontinence of feces 09/09/2017   Balance problem 09/09/2017   Diarrhea 04/03/2016   Spina bifida of lumbar region without hydrocephalus (HCC) 03/07/2016   Mild obstructive sleep apnea  03/07/2016   Iron deficiency anemia 03/07/2016   Pica in adults 03/07/2016   Spina bifida of lumbosacral region without hydrocephalus (HCC) 08/16/2015   Nocturnal headaches 08/16/2015   Essential hypertension 08/16/2015   Osteoarthritis of right knee 10/11/2014   Status post total right knee replacement 10/11/2014   Primary snoring 07/05/2014   Bronchiectasis without complication (HCC) 12/29/2013   DM type 2 (diabetes mellitus, type 2) (HCC) 11/10/2013   History of colonic polyps 03/07/2008   ADENOMATOUS COLONIC POLYP 03/04/2008   ESOPHAGEAL STRICTURE 03/04/2008   GERD 03/04/2008   IBS 03/04/2008   Dysphagia, pharyngoesophageal phase 02/05/2008   Past Medical History:  Diagnosis Date   Allergy     Anemia    pernicious anemia- recieved iron infusions until 10-2015   Anginal pain (HCC)    Anxiety    Arthritis    foot drop  lt foot  / toes   wears brace      Bronchiectasis (HCC)    Cancer (HCC)    SKIN     Cataract    REMOVED   Chronic cough    CKD (chronic kidney disease)    Colon polyps    Coronary artery calcification seen on CT scan 12/22/2017   CT in 2016   Depression    Diabetes mellitus    Dysphagia    Dysrhythmia    RAPID HEART BEAT , CHEST TIGHTNESS  09/29/14      Gastritis  GERD (gastroesophageal reflux disease)    Hiatal hernia    History of echocardiogram    Echo 5/19: Mild focal basal septal hypertrophy, EF 60-65, normal wall motion, grade 1 diastolic dysfunction, mildly dilated ascending aorta (38 mm), normal RVSF, mild TR, PASP 23   Hyperlipidemia    Hypertension    IBS (irritable bowel syndrome)    Insomnia    Kidney stones    KIDNEY DISEASE   STAGE lll     Kidney stones    Macular disorder    MACULAR DISEASE  RT EYE    Meningitis    Osteoporosis    Pneumonia    BRONCHIECTASIS   Shortness of breath dyspnea    WITH EXERTION   Sleep apnea    uses cpap   Spina bifida occulta    Ulcer     Family History  Problem Relation Age of Onset    Alzheimer's disease Father    Irritable bowel syndrome Father    Ulcers Father    Gout Father    Cancer Mother        ?   Diabetes Mother    Hypertension Mother    Nephrolithiasis Mother    Colon cancer Mother    Ovarian cancer Mother    Coronary artery disease Other        grandparent   Thyroid cancer Sister    Nephrolithiasis Brother    Colon cancer Maternal Grandmother    Heart attack Maternal Grandmother 39   Esophageal cancer Cousin    Esophageal cancer Cousin    Heart attack Maternal Grandfather 12   Colon polyps Neg Hx    Rectal cancer Neg Hx    Stomach cancer Neg Hx     Past Surgical History:  Procedure Laterality Date   ABDOMINAL HYSTERECTOMY     BACK SURGERY     CATARACT EXTRACTION  03/2012   bilateral   COLONOSCOPY     eardrum     left   ruptured and repaired   ELBOW SURGERY     left   fundoplication     KNEE ARTHROSCOPY     rt knee    LEFT HEART CATH AND CORONARY ANGIOGRAPHY N/A 12/25/2017   Procedure: LEFT HEART CATH AND CORONARY ANGIOGRAPHY;  Surgeon: Mady Bruckner, MD;  Location: [facility];  Service: Cardiovascular;  Laterality: N/A;   LITHOTRIPSY     MENISCUS REPAIR     POLYPECTOMY     TONSILLECTOMY     TOTAL KNEE ARTHROPLASTY Right 10/11/2014   Procedure: RIGHT TOTAL KNEE ARTHROPLASTY;  Surgeon: Bruckner CINDERELLA Poli, MD;  Location[facility];  Service: Orthopedics;  Laterality: Right;   Social History   Occupational History   Occupation: Retired    Comment: LPN  Tobacco Use   Smoking status: Never   Smokeless tobacco: Never  Vaping Use   Vaping status: Never Used  Substance and Sexual Activity   Alcohol  use: No    Comment: OCC.   Drug use: No   Sexual activity: Not Currently

## 2023-10-06 ENCOUNTER — Other Ambulatory Visit (INDEPENDENT_AMBULATORY_CARE_PROVIDER_SITE_OTHER): Payer: Medicare Other

## 2023-10-06 ENCOUNTER — Encounter: Payer: Self-pay | Admitting: Orthopedic Surgery

## 2023-10-06 ENCOUNTER — Ambulatory Visit: Payer: Medicare Other | Admitting: Orthopedic Surgery

## 2023-10-06 DIAGNOSIS — S42291A Other displaced fracture of upper end of right humerus, initial encounter for closed fracture: Secondary | ICD-10-CM

## 2023-10-06 NOTE — Progress Notes (Signed)
Post-Op Visit Note   Patient: Tiffany [name]           Date of Birth: 1950/10/30           MRN: [id number] Visit Date: 10/06/2023 PCP: Chilton Greathouse, MD   Assessment & Plan:  Chief Complaint:  Chief Complaint  Patient presents with   Right Shoulder - Follow-up   Visit Diagnoses:  1. Humeral head fracture, right, closed, initial encounter     Plan: The is a 73 year old patient who is now about 3 months out right proximal humerus fracture.  She has been doing well.  On examination fracture moves as a unit and she does have good rotator cuff strength.  Has forward flexion and abduction both above 90 degrees which is very good at this point.  Continue with stretching exercises and I would be cautious about lifting out in front of her body to avoid unnecessary stress on the healing greater tuberosity fracture.  Overall I think her motion is excellent and she should continue to progress improving over the next 4 to 6 months before plateauing by the end of the summer.  She will follow-up as needed.  Follow-Up Instructions: No follow-ups on file.   Orders:  Orders Placed This Encounter  Procedures   XR Shoulder Right   No orders of the defined types were placed in this encounter.   Imaging: No results found.  PMFS History: Patient Active Problem List   Diagnosis Date Noted   Pain in right shoulder 07/14/2023   Humeral head fracture, right, closed, initial encounter 07/14/2023   Seasonal allergies 09/07/2021   Acute bacterial rhinosinusitis 08/24/2021   Morbid obesity (HCC) 10/19/2020   Coronary artery calcification seen on CT scan 12/22/2017   Full incontinence of feces 09/09/2017   Balance problem 09/09/2017   Diarrhea 04/03/2016   Spina bifida of lumbar region without hydrocephalus (HCC) 03/07/2016   Mild obstructive sleep apnea 03/07/2016   Iron deficiency anemia 03/07/2016   Pica in adults 03/07/2016   Spina bifida of lumbosacral region without hydrocephalus  (HCC) 08/16/2015   Nocturnal headaches 08/16/2015   Essential hypertension 08/16/2015   Osteoarthritis of right knee 10/11/2014   Status post total right knee replacement 10/11/2014   Primary snoring 07/05/2014   Bronchiectasis without complication (HCC) 12/29/2013   DM type 2 (diabetes mellitus, type 2) (HCC) 11/10/2013   History of colonic polyps 03/07/2008   ADENOMATOUS COLONIC POLYP 03/04/2008   ESOPHAGEAL STRICTURE 03/04/2008   GERD 03/04/2008   IBS 03/04/2008   Dysphagia, pharyngoesophageal phase 02/05/2008   Past Medical History:  Diagnosis Date   Allergy    Anemia    pernicious anemia- recieved iron infusions until 10-2015   Anginal pain (HCC)    Anxiety    Arthritis    foot drop  lt foot  / toes   wears brace      Bronchiectasis (HCC)    Cancer (HCC)    SKIN     Cataract    REMOVED   Chronic cough    CKD (chronic kidney disease)    Colon polyps    Coronary artery calcification seen on CT scan 12/22/2017   CT in 2016   Depression    Diabetes mellitus    Dysphagia    Dysrhythmia    RAPID HEART BEAT , CHEST TIGHTNESS  09/29/14      Gastritis    GERD (gastroesophageal reflux disease)    Hiatal hernia    History of echocardiogram  Echo 5/19: Mild focal basal septal hypertrophy, EF 60-65, normal wall motion, grade 1 diastolic dysfunction, mildly dilated ascending aorta (38 mm), normal RVSF, mild TR, PASP 23   Hyperlipidemia    Hypertension    IBS (irritable bowel syndrome)    Insomnia    Kidney stones    KIDNEY DISEASE   STAGE lll     Kidney stones    Macular disorder    MACULAR DISEASE  RT EYE    Meningitis    Osteoporosis    Pneumonia    BRONCHIECTASIS   Shortness of breath dyspnea    WITH EXERTION   Sleep apnea    uses cpap   Spina bifida occulta    Ulcer     Family History  Problem Relation Age of Onset   Alzheimer's disease Father    Irritable bowel syndrome Father    Ulcers Father    Gout Father    Cancer Mother        ?   Diabetes Mother     Hypertension Mother    Nephrolithiasis Mother    Colon cancer Mother    Ovarian cancer Mother    Coronary artery disease Other        grandparent   Thyroid cancer Sister    Nephrolithiasis Brother    Colon cancer Maternal Grandmother    Heart attack Maternal Grandmother 57   Esophageal cancer Cousin    Esophageal cancer Cousin    Heart attack Maternal Grandfather 28   Colon polyps Neg Hx    Rectal cancer Neg Hx    Stomach cancer Neg Hx     Past Surgical History:  Procedure Laterality Date   ABDOMINAL HYSTERECTOMY     BACK SURGERY     CATARACT EXTRACTION  03/2012   bilateral   COLONOSCOPY     eardrum     left   ruptured and repaired   ELBOW SURGERY     left   fundoplication     KNEE ARTHROSCOPY     rt knee    LEFT HEART CATH AND CORONARY ANGIOGRAPHY N/A 12/25/2017   Procedure: LEFT HEART CATH AND CORONARY ANGIOGRAPHY;  Surgeon: Yvonne Kendall, MD;  Location: [facility];  Service: Cardiovascular;  Laterality: N/A;   LITHOTRIPSY     MENISCUS REPAIR     POLYPECTOMY     TONSILLECTOMY     TOTAL KNEE ARTHROPLASTY Right 10/11/2014   Procedure: RIGHT TOTAL KNEE ARTHROPLASTY;  Surgeon: Kathryne Hitch, MD;  Location: [facility] OR;  Service: Orthopedics;  Laterality: Right;   Social History   Occupational History   Occupation: Retired    Comment: LPN  Tobacco Use   Smoking status: Never   Smokeless tobacco: Never  Vaping Use   Vaping status: Never Used  Substance and Sexual Activity   Alcohol use: No    Comment: OCC.   Drug use: No   Sexual activity: Not Currently

## 2023-11-07 ENCOUNTER — Other Ambulatory Visit: Payer: Self-pay | Admitting: Internal Medicine

## 2023-11-07 DIAGNOSIS — Z1231 Encounter for screening mammogram for malignant neoplasm of breast: Secondary | ICD-10-CM

## 2023-11-28 ENCOUNTER — Ambulatory Visit

## 2023-12-05 ENCOUNTER — Ambulatory Visit
Admission: RE | Admit: 2023-12-05 | Discharge: 2023-12-05 | Disposition: A | Discharge status: Home / Self Care | Source: Ambulatory Visit | Attending: Internal Medicine | Admitting: Internal Medicine

## 2023-12-05 DIAGNOSIS — Z1231 Encounter for screening mammogram for malignant neoplasm of breast: Secondary | ICD-10-CM

## 2024-02-01 ENCOUNTER — Other Ambulatory Visit: Payer: Self-pay | Admitting: Internal Medicine

## 2024-02-02 ENCOUNTER — Encounter: Payer: Self-pay | Admitting: Internal Medicine

## 2024-02-02 ENCOUNTER — Encounter: Payer: Self-pay | Admitting: Gastroenterology

## 2024-02-02 ENCOUNTER — Ambulatory Visit: Admitting: Gastroenterology

## 2024-02-02 ENCOUNTER — Other Ambulatory Visit (INDEPENDENT_AMBULATORY_CARE_PROVIDER_SITE_OTHER)

## 2024-02-02 VITALS — BP 130/74 | HR 74 | Wt 177.5 lb

## 2024-02-02 DIAGNOSIS — R195 Other fecal abnormalities: Secondary | ICD-10-CM

## 2024-02-02 DIAGNOSIS — K529 Noninfective gastroenteritis and colitis, unspecified: Secondary | ICD-10-CM

## 2024-02-02 DIAGNOSIS — Z860100 Personal history of colon polyps, unspecified: Secondary | ICD-10-CM

## 2024-02-02 DIAGNOSIS — R1030 Lower abdominal pain, unspecified: Secondary | ICD-10-CM | POA: Diagnosis not present

## 2024-02-02 DIAGNOSIS — R1310 Dysphagia, unspecified: Secondary | ICD-10-CM

## 2024-02-02 DIAGNOSIS — E1169 Type 2 diabetes mellitus with other specified complication: Secondary | ICD-10-CM

## 2024-02-02 DIAGNOSIS — R159 Full incontinence of feces: Secondary | ICD-10-CM

## 2024-02-02 DIAGNOSIS — Z860101 Personal history of adenomatous and serrated colon polyps: Secondary | ICD-10-CM

## 2024-02-02 LAB — CBC WITH DIFFERENTIAL/PLATELET
Basophils Absolute: 0.1 10*3/uL (ref 0.0–0.1)
Basophils Relative: 0.7 % (ref 0.0–3.0)
Eosinophils Absolute: 0.1 10*3/uL (ref 0.0–0.7)
Eosinophils Relative: 1.4 % (ref 0.0–5.0)
HCT: 37.8 % (ref 36.0–46.0)
Hemoglobin: 12.5 g/dL (ref 12.0–15.0)
Lymphocytes Relative: 21.1 % (ref 12.0–46.0)
Lymphs Abs: 1.5 10*3/uL (ref 0.7–4.0)
MCHC: 33.1 g/dL (ref 30.0–36.0)
MCV: 90.3 fl (ref 78.0–100.0)
Monocytes Absolute: 0.4 10*3/uL (ref 0.1–1.0)
Monocytes Relative: 5.7 % (ref 3.0–12.0)
Neutro Abs: 5 10*3/uL (ref 1.4–7.7)
Neutrophils Relative %: 71.1 % (ref 43.0–77.0)
Platelets: 188 10*3/uL (ref 150.0–400.0)
RBC: 4.19 Mil/uL (ref 3.87–5.11)
RDW: 12.8 % (ref 11.5–15.5)
WBC: 7 10*3/uL (ref 4.0–10.5)

## 2024-02-02 LAB — COMPREHENSIVE METABOLIC PANEL WITH GFR
ALT: 15 U/L (ref 0–35)
AST: 15 U/L (ref 0–37)
Albumin: 4.7 g/dL (ref 3.5–5.2)
Alkaline Phosphatase: 91 U/L (ref 39–117)
BUN: 24 mg/dL — ABNORMAL HIGH (ref 6–23)
CO2: 26 meq/L (ref 19–32)
Calcium: 9.5 mg/dL (ref 8.4–10.5)
Chloride: 104 meq/L (ref 96–112)
Creatinine, Ser: 1.74 mg/dL — ABNORMAL HIGH (ref 0.40–1.20)
GFR: 29.03 mL/min — ABNORMAL LOW (ref >60.00)
Glucose, Bld: 129 mg/dL — ABNORMAL HIGH (ref 70–99)
Potassium: 3.9 meq/L (ref 3.5–5.1)
Sodium: 141 meq/L (ref 135–145)
Total Bilirubin: 0.6 mg/dL (ref 0.2–1.2)
Total Protein: 7.7 g/dL (ref 6.0–8.3)

## 2024-02-02 MED ORDER — NA SULFATE-K SULFATE-MG SULF 17.5-3.13-1.6 GM/177ML PO SOLN: 1.0000 | ORAL | 0 refills | Status: DC

## 2024-02-02 NOTE — Progress Notes (Signed)
 Noted

## 2024-02-02 NOTE — Progress Notes (Signed)
 Chief Complaint: Acute on chronic diarrhea Primary GI MD: Dr. Elvin Hammer  HPI: 73 year old female with medical history as listed below presents for evaluation of acute on chronic diarrhea  She has experienced severe diarrhea for the past three weeks, which is a worsening of her usual chronic diarrhea. Typically, she manages her symptoms with Pepto Bismol tablets, but they have been ineffective recently, and her stools have turned black. She uses eight diapers in one morning due to the frequency of her bowel movements. Her diet is limited to liquids such as chicken noodle soup broth, water , zero sugar ginger ale, and salty crackers to avoid aggravating her symptoms. She experiences a constant, non-radiating pain around her periumbilical and lower abdomen, which does not improve after bowel movements. She also reports weakness and general malaise. No vomiting, but some nausea and no recent solid bowel movements.  She has a history of high triglycerides, currently at 729 mg/dL, and has experienced floating stools. She has tried fiber supplements in the past without success. She is allergic to shellfish, which has limited her medication options for managing her triglycerides. Her family history includes high triglycerides.  She has a history of stage 3B renal failure and spondylitis, for which she underwent surgery in 1994. She also mentions a recent shoulder injury from a fall, which was managed with physical therapy after initially being treated with a sling.  She has a personal history of being a nurse for 35 years. She wears diapers regularly due to her chronic diarrhea and has not undergone pelvic floor physical therapy or anorectal manometry.   PREVIOUS GI WORKUP   EGD 03/2021 - Normal esophagus. Dilated.  - Normal stomach. Biopsied.  - Normal examined duodenum.  Colonoscopy 03/2021 - Five 1 to 5 mm polyps in the descending colon, in the transverse colon, in the ascending colon and in the  cecum, removed with a cold snare. Resected and retrieved.  - Diverticulosis in the sigmoid colon.  - Internal hemorrhoids.  Diagnosis 1. Surgical [P], colon, cecum, ascending, polyp (3) - TUBULAR ADENOMA(S) - NEGATIVE FOR HIGH-GRADE DYSPLASIA OR MALIGNANCY 2. Surgical [P], colon nos, random sites - COLONIC MUCOSA WITH NO SPECIFIC HISTOPATHOLOGIC CHANGES - NEGATIVE FOR ACUTE INFLAMMATION, INCREASED INTRAEPITHELIAL LYMPHOCYTES OR THICKENED SUBEPITHELIAL COLLAGEN TABLE 3. Surgical [P], colon, transverse, descending, polyp (2) - TUBULAR ADENOMA(S) - NEGATIVE FOR HIGH-GRADE DYSPLASIA OR MALIGNANCY 4. Surgical [P], gastric antrum - GASTRIC ANTRAL MUCOSA WITH NO SPECIFIC HISTOPATHOLOGIC CHANGES - WARTHIN STARRY STAIN IS NEGATIVE FOR HELICOBACTER PYLORI  Past Medical History:  Diagnosis Date   Allergy     Anemia    pernicious anemia- recieved iron infusions until 10-2015   Anginal pain (HCC)    Anxiety    Arthritis    foot drop  lt foot  / toes   wears brace      Bronchiectasis (HCC)    Cancer (HCC)    SKIN     Cataract    REMOVED   Chronic cough    CKD (chronic kidney disease)    Colon polyps    Coronary artery calcification seen on CT scan 12/22/2017   CT in 2016   Depression    Diabetes mellitus    Dysphagia    Dysrhythmia    RAPID HEART BEAT , CHEST TIGHTNESS  09/29/14      Gastritis    GERD (gastroesophageal reflux disease)    Hiatal hernia    History of echocardiogram    Echo 5/19: Mild focal basal septal hypertrophy, EF  60-65, normal wall motion, grade 1 diastolic dysfunction, mildly dilated ascending aorta (38 mm), normal RVSF, mild TR, PASP 23   Hyperlipidemia    Hypertension    IBS (irritable bowel syndrome)    Insomnia    Kidney stones    KIDNEY DISEASE   STAGE lll     Kidney stones    Macular disorder    MACULAR DISEASE  RT EYE    Meningitis    Osteoporosis    Pneumonia    BRONCHIECTASIS   Shortness of breath dyspnea    WITH EXERTION   Shoulder injury     05-2023 right did physical therapy for it   Sleep apnea    uses cpap   Spina bifida occulta    Ulcer     Past Surgical History:  Procedure Laterality Date   ABDOMINAL HYSTERECTOMY     BACK SURGERY     CATARACT EXTRACTION  03/2012   bilateral   COLONOSCOPY     eardrum     left   ruptured and repaired   ELBOW SURGERY     left   fundoplication     KNEE ARTHROSCOPY     rt knee    LEFT HEART CATH AND CORONARY ANGIOGRAPHY N/A 12/25/2017   Procedure: LEFT HEART CATH AND CORONARY ANGIOGRAPHY;  Surgeon: Sammy Crisp, MD;  Location: [facility];  Service: Cardiovascular;  Laterality: N/A;   LITHOTRIPSY     MENISCUS REPAIR     POLYPECTOMY     TONSILLECTOMY     TOTAL KNEE ARTHROPLASTY Right 10/11/2014   Procedure: RIGHT TOTAL KNEE ARTHROPLASTY;  Surgeon: Arnie Lao, MD;  Location: [facility] OR;  Service: Orthopedics;  Laterality: Right;    Current Outpatient Medications  Medication Sig Dispense Refill   albuterol  (VENTOLIN  HFA) 108 (90 Base) MCG/ACT inhaler Inhale 1-2 puffs into the lungs every 6 (six) hours as needed for wheezing or shortness of breath. 8 g 2   amLODipine  (NORVASC ) 2.5 MG tablet Take one tablet by mouth daily in addition to your Amlodipine  5mg  for a total of 7.5mg  once daily 90 tablet 0   amLODipine  (NORVASC ) 5 MG tablet TAKE 1 AND 1/2 TABLETS BY MOUTH  DAILY 135 tablet 3   atorvastatin  (LIPITOR) 40 MG tablet Take 1 tablet (40 mg total) by mouth daily. 90 tablet 3   Blood Glucose Monitoring Suppl (ONETOUCH VERIO FLEX SYSTEM) w/Device KIT USE AS DIRECTED TO CHECK BLOOD SUGAR 2-3 TIMES DAILY. Dx: E11.29     Blood Glucose Monitoring Suppl (ONETOUCH VERIO REFLECT) w/Device KIT USE AS DIRECTED TO CHECK BLOOD SUGAR TWICE TO THREE TIMES DAILY (DX: ICD10: E11.65)     calcium  carbonate (OS-CAL) 600 MG TABS tablet Take 600 mg by mouth 3 (three) times daily with meals.     Cholecalciferol  (VITAMIN D  PO) Take 5,000 Units by mouth daily.      diazepam  (VALIUM ) 10 MG  tablet Take 10 mg by mouth at bedtime as needed for anxiety or sleep.      dicyclomine  (BENTYL ) 20 MG tablet Take 1 tablet (20 mg total) by mouth every 6 (six) hours as needed for spasms. 100 tablet 3   diphenhydrAMINE  (BENADRYL ) 25 mg capsule Take 25 mg by mouth every 6 (six) hours as needed for allergies.     DULoxetine  (CYMBALTA ) 60 MG capsule TAKE 1 CAPSULE BY MOUTH IN  THE EVENING WITH FOOD     EPINEPHrine  0.3 mg/0.3 mL IJ SOAJ injection Use as directed Injection for 30  days     estradiol (ESTRACE) 0.1 MG/GM vaginal cream SMARTSIG:1 Applicator Vaginal Every Other Day     fluticasone  (FLONASE ) 50 MCG/ACT nasal spray SHAKE LIQUID AND USE 1 SPRAY IN EACH NOSTRIL DAILY 16 g 5   furosemide (LASIX) 40 MG tablet Take 20 mg by mouth daily as needed for fluid or edema.      hydrALAZINE  (APRESOLINE ) 50 MG tablet TAKE 1 TABLET BY MOUTH 3 TIMES  DAILY 270 tablet 3   HYDROcodone -acetaminophen  (NORCO/VICODIN) 5-325 MG tablet Take 1 tablet by mouth every 8 (eight) hours as needed for severe pain (pain score 7-10). 30 tablet 0   loperamide (IMODIUM) 2 MG capsule Take 2 mg by mouth as needed for diarrhea or loose stools.     loratadine  (CLARITIN ) 10 MG tablet Take 10 mg by mouth daily as needed for allergies.      Meclizine  HCl 25 MG CHEW take one tab by mouth every 6 hrs as needed for vertigo     metFORMIN  (GLUCOPHAGE -XR) 500 MG 24 hr tablet 500 mg daily. (Patient not taking: Reported on 02/02/2024)     metoprolol  succinate (TOPROL -XL) 50 MG 24 hr tablet Take 1 tablet (50 mg total) by mouth 2 (two) times daily. Take with or immediately following a meal. 180 tablet 3   ondansetron  (ZOFRAN -ODT) 4 MG disintegrating tablet Take 1 tablet (4 mg total) by mouth every 8 (eight) hours as needed for nausea or vomiting. 20 tablet 0   Polyethyl Glycol-Propyl Glycol (SYSTANE) 0.4-0.3 % GEL ophthalmic gel See admin instructions.     Polyethyl Glycol-Propyl Glycol 0.4-0.3 % SOLN Place 2 drops into both eyes 2 (two) times  daily.      promethazine  (PHENERGAN ) 25 MG tablet Take 25 mg by mouth every 6 (six) hours as needed for nausea or vomiting.      sodium chloride  HYPERTONIC 3 % nebulizer solution Take by nebulization 2 (two) times daily. 720 mL 3   No current facility-administered medications for this visit.    Allergies as of 02/02/2024 - Review Complete 02/02/2024  Allergen Reaction Noted   Ambien [zolpidem tartrate] Other (See Comments) 09/07/2012   Amoxicillin  Anaphylaxis 07/09/2023   Aspirin  Other (See Comments) 09/07/2012   Benicar [olmesartan medoxomil] Shortness Of Breath, Swelling, and Other (See Comments) 11/24/2011   Codeine Nausea And Vomiting    Erythromycin Nausea And Vomiting    Keflex [cephalexin] Anaphylaxis 07/09/2023   Ketorolac Anaphylaxis 12/15/2015   Ketorolac tromethamine Anaphylaxis 11/24/2011   Latex Shortness Of Breath and Itching 11/09/2013   Lisinopril Shortness Of Breath, Swelling, and Other (See Comments)    Olmesartan Other (See Comments), Shortness Of Breath, and Swelling 12/15/2015   Other Anaphylaxis 11/09/2013   Reclast [zoledronic acid] Other (See Comments) 09/07/2012   Shellfish allergy  Anaphylaxis 07/09/2023   Toradol [ketorolac tromethamine] Anaphylaxis 09/07/2012   Zolpidem Other (See Comments) 12/15/2015   Fenofibrate Nausea And Vomiting 09/07/2012   Glucosamine Diarrhea and Nausea Only 12/15/2015   Glucosamine forte [nutritional supplements] Diarrhea and Nausea Only 09/07/2012   Levaquin [levofloxacin in d5w] Diarrhea and Swelling 09/07/2012   Levofloxacin Diarrhea and Swelling 12/15/2015   Percocet [oxycodone -acetaminophen ] Nausea And Vomiting 09/07/2012   Tramadol  Nausea And Vomiting 09/07/2012   Atorvastatin  Other (See Comments) 10/12/2020   Lomotil [diphenoxylate-atropine] Other (See Comments) 03/29/2016   Lopid  [gemfibrozil ] Diarrhea 04/30/2023   Metformin  hcl Other (See Comments) 10/12/2020   Penicillins  04/10/2023   Tizanidine hcl Other (See  Comments) 10/12/2020   Venlafaxine Other (See Comments) 10/12/2020  Tizanidine Nausea And Vomiting 09/07/2012    Family History  Problem Relation Age of Onset   Alzheimer's disease Father    Irritable bowel syndrome Father    Ulcers Father    Gout Father    Cancer Mother        ?   Diabetes Mother    Hypertension Mother    Nephrolithiasis Mother    Colon cancer Mother    Ovarian cancer Mother    Coronary artery disease Other        grandparent   Thyroid cancer Sister    Nephrolithiasis Brother    Colon cancer Maternal Grandmother    Heart attack Maternal Grandmother 19   Esophageal cancer Cousin    Esophageal cancer Cousin    Heart attack Maternal Grandfather 12   Colon polyps Neg Hx    Rectal cancer Neg Hx    Stomach cancer Neg Hx     Social History   Socioeconomic History   Marital status: Married    Spouse name: Not on file   Number of children: 2   Years of education: Not on file   Highest education level: Not on file  Occupational History   Occupation: Retired    Comment: LPN  Tobacco Use   Smoking status: Never   Smokeless tobacco: Never  Vaping Use   Vaping status: Never Used  Substance and Sexual Activity   Alcohol  use: No    Comment: OCC.   Drug use: No   Sexual activity: Not Currently  Other Topics Concern   Not on file  Social History Narrative   Not on file   Social Drivers of Health   Financial Resource Strain: Not on file  Food Insecurity: Not on file  Transportation Needs: Not on file  Physical Activity: Not on file  Stress: Not on file  Social Connections: Unknown (07/02/2023)   Received from [facility]   Social Network    Social Network: Not on file  Intimate Partner Violence: Not At Risk (07/02/2023)   Received from [facility]   HITS    Over the last 12 months how often did your partner physically hurt you?: Never    Over the last 12 months how often did your partner insult you or talk down to you?: Never    Over the  last 12 months how often did your partner threaten you with physical harm?: Never    Over the last 12 months how often did your partner scream or curse at you?: Never    Review of Systems:    Constitutional: No weight loss, fever, chills, weakness or fatigue HEENT: Eyes: No change in vision               Ears, Nose, Throat:  No change in hearing or congestion Skin: No rash or itching Cardiovascular: No chest pain, chest pressure or palpitations   Respiratory: No SOB or cough Gastrointestinal: See HPI and otherwise negative Genitourinary: No dysuria or change in urinary frequency Neurological: No headache, dizziness or syncope Musculoskeletal: No new muscle or joint pain Hematologic: No bleeding or bruising Psychiatric: No history of depression or anxiety    Physical Exam:  Vital signs: BP 130/74   Pulse 74   Wt 177 lb 8 oz (80.5 kg)   BMI 31.44 kg/m   Constitutional: NAD, alert and cooperative Head:  Normocephalic and atraumatic. Eyes:   PEERL, EOMI. No icterus. Conjunctiva pink. Respiratory: Respirations even and unlabored. Lungs clear to auscultation bilaterally.  No wheezes, crackles, or rhonchi.  Cardiovascular:  Regular rate and rhythm. No peripheral edema, cyanosis or pallor.  Gastrointestinal:  Soft, nondistended, nontender. No rebound or guarding.  Hypoactive bowel sounds. No appreciable masses or hepatomegaly. Rectal:  Declines Msk:  Symmetrical without gross deformities. Without edema, no deformity or joint abnormality.  Neurologic:  Alert and  oriented x4;  grossly normal neurologically.  Skin:   Dry and intact without significant lesions or rashes. Psychiatric: Oriented to person, place and time. Demonstrates good judgement and reason without abnormal affect or behaviors.   RELEVANT LABS AND IMAGING: CBC    Component Value Date/Time   WBC 11.1 (H) 09/01/2021 1947   RBC 3.40 (L) 09/01/2021 1947   HGB 10.2 (L) 09/01/2021 1947   HGB 11.3 11/27/2020 0952   HCT  32.7 (L) 09/01/2021 1947   HCT 34.3 11/27/2020 0952   PLT 188 09/01/2021 1947   PLT 240 11/27/2020 0952   MCV 96.2 09/01/2021 1947   MCV 91 11/27/2020 0952   MCH 30.0 09/01/2021 1947   MCHC 31.2 09/01/2021 1947   RDW 12.9 09/01/2021 1947   RDW 12.3 11/27/2020 0952   LYMPHSABS 1.3 09/01/2021 1947   MONOABS 0.6 09/01/2021 1947   EOSABS 0.1 09/01/2021 1947   BASOSABS 0.1 09/01/2021 1947    CMP     Component Value Date/Time   NA 139 09/01/2021 1947   NA 144 11/27/2020 0952   K 3.7 09/01/2021 1947   CL 104 09/01/2021 1947   CO2 22 09/01/2021 1947   GLUCOSE 225 (H) 09/01/2021 1947   BUN 30 (H) 09/01/2021 1947   BUN 25 11/27/2020 0952   CREATININE 2.29 (H) 09/01/2021 1947   CALCIUM  8.3 (L) 09/01/2021 1947   PROT 7.1 06/12/2023 0900   ALBUMIN 4.4 06/12/2023 0900   AST 17 06/12/2023 0900   ALT 12 06/12/2023 0900   ALKPHOS 123 (H) 06/12/2023 0900   BILITOT 0.3 06/12/2023 0900   GFRNONAA 23 (L) 09/01/2021 1947   GFRAA 46 (L) 07/26/2019 1444     Assessment/Plan:   Acute on chronic diarrhea Lower abdominal pain Diarrhea ongoing for 12 years becoming more severe over the last 3 weeks.  Recent Macrobid use for UTI.  Often has fecal incontinence.  Colonoscopy in 2019 negative for microscopic colitis.  Triglycerides of 792 with floating stools and hx of diabetes. Suspect infectious etiology with acute onset.  The multiple DDx could be considered for her chronic diarrhea including pancreatic insufficiency, pelvic floor dysfunction, or even overflow diarrhea with her hypoactive bowel sounds on exam today. - C. difficile and GI pathogen Diatherix to rule out infection - CBC, CMP - She is due for her colonoscopy, will go ahead and schedule her colonoscopy today - If stool studies are negative and colonoscopy is unrevealing consider pelvic floor physical therapy. - Fecal elastase would likely be abnormal in the setting of watery stools but remains on differential with history of  diabetes - Follow-up with me in 12 weeks  GERD s/p fundoplication Dysphagia Normal EGD 03/2021 with dilation and negative biopsies.  History of colon polyps Colonoscopy 03/2021 with FIVE 1-5 mm tubular adenomas and a recall of 3 years (03/2024) - Colonoscopy - I thoroughly discussed the procedure with the patient (at bedside) to include nature of the procedure, alternatives, benefits, and risks (including but not limited to bleeding, infection, perforation, anesthesia/cardiac pulmonary complications).  Patient verbalized understanding and gave verbal consent to proceed with procedure.     Suzanna Erp, PA-C [facility] 02/02/2024,  3:41 PM  Cc: Avva, Ravisankar, MD

## 2024-02-02 NOTE — Patient Instructions (Signed)
 Your provider has requested that you go to the basement level for lab work before leaving today. Press "B" on the elevator. The lab is located at the first door on the left as you exit the elevator.  Due to recent changes in healthcare laws, you may see the results of your imaging and laboratory studies on MyChart before your provider has had a chance to review them.  We understand that in some cases there may be results that are confusing or concerning to you. Not all laboratory results come back in the same time frame and the provider may be waiting for multiple results in order to interpret others.  Please give us  48 hours in order for your provider to thoroughly review all the results before contacting the office for clarification of your results.   Your provider has ordered "Diatherix" stool testing for you. You have received a kit from our office today containing all necessary supplies to complete this test. Please carefully read the stool collection instructions provided in the kit before opening the accompanying materials. In addition, be sure there is a label providing your full name and date of birth on the "puritan opti-swab" tube that is supplied in the kit (if you do not see a label with this information on your test tube, please make us  aware before test collection!). After completing the test, you should secure the purtian tube into the specimen biohazard bag. The [facility] E-Req sheet (including date and time of specimen collection) should be placed into the outside pocket of the specimen biohazard bag and returned to the [place] lab (basement floor of [facility]) within 2 days of collection. Please make sure to give the specimen to a staff member at the lab. DO NOT leave the specimen on the counter.   If the specimen date and time (can be found in the upper right boxed portion of the sheet) are not filled out on the E-Req sheet, the test will NOT be performed.    You have been scheduled for a colonoscopy. Please follow written instructions given to you at your visit today.   If you use inhalers (even only as needed), please bring them with you on the day of your procedure.  DO NOT TAKE 7 DAYS PRIOR TO TEST- Trulicity (dulaglutide) Ozempic, Wegovy (semaglutide) Mounjaro (tirzepatide) Bydureon Bcise (exanatide extended release)  DO NOT TAKE 1 DAY PRIOR TO YOUR TEST Rybelsus (semaglutide) Adlyxin (lixisenatide) Victoza (liraglutide) Byetta (exanatide) ___________________________________________________________________________  I appreciate the opportunity to care for you. Suzanna Erp, PA

## 2024-02-03 ENCOUNTER — Ambulatory Visit: Payer: Self-pay | Admitting: Gastroenterology

## 2024-02-03 MED ORDER — AMLODIPINE BESYLATE 2.5 MG PO TABS: ORAL_TABLET | ORAL | 0 refills | Status: DC

## 2024-02-05 ENCOUNTER — Telehealth: Payer: Self-pay | Admitting: Gastroenterology

## 2024-02-05 NOTE — Telephone Encounter (Signed)
 Negative for c diff and other infections. Recommend fiber supplement daily (benefiber 1-2 times daily). Continue with colonoscopy. Consider pelvic floor physical therapy if unrevealing colonoscopy and no improvement with fiber

## 2024-02-20 ENCOUNTER — Encounter: Payer: Self-pay | Admitting: Internal Medicine

## 2024-02-20 ENCOUNTER — Ambulatory Visit (AMBULATORY_SURGERY_CENTER): Admitting: Internal Medicine

## 2024-02-20 ENCOUNTER — Other Ambulatory Visit: Payer: Self-pay | Admitting: Internal Medicine

## 2024-02-20 VITALS — BP 112/73 | HR 75 | Temp 97.9°F | Resp 15 | Ht 63.0 in | Wt 177.0 lb

## 2024-02-20 DIAGNOSIS — Z860100 Personal history of colon polyps, unspecified: Secondary | ICD-10-CM

## 2024-02-20 DIAGNOSIS — D123 Benign neoplasm of transverse colon: Secondary | ICD-10-CM

## 2024-02-20 DIAGNOSIS — K648 Other hemorrhoids: Secondary | ICD-10-CM | POA: Diagnosis not present

## 2024-02-20 DIAGNOSIS — Q760 Spina bifida occulta: Secondary | ICD-10-CM | POA: Insufficient documentation

## 2024-02-20 DIAGNOSIS — K5730 Diverticulosis of large intestine without perforation or abscess without bleeding: Secondary | ICD-10-CM

## 2024-02-20 DIAGNOSIS — Z1211 Encounter for screening for malignant neoplasm of colon: Secondary | ICD-10-CM | POA: Diagnosis not present

## 2024-02-20 DIAGNOSIS — D122 Benign neoplasm of ascending colon: Secondary | ICD-10-CM

## 2024-02-20 DIAGNOSIS — D124 Benign neoplasm of descending colon: Secondary | ICD-10-CM | POA: Diagnosis not present

## 2024-02-20 DIAGNOSIS — R195 Other fecal abnormalities: Secondary | ICD-10-CM

## 2024-02-20 MED ORDER — SODIUM CHLORIDE 0.9 % IV SOLN: 500.0000 mL | Freq: Once | INTRAVENOUS | Status: DC

## 2024-02-20 NOTE — Progress Notes (Unsigned)
 Called to room to assist during endoscopic procedure.  Patient ID and intended procedure confirmed with present staff. Received instructions for my participation in the procedure from the performing physician.

## 2024-02-20 NOTE — Patient Instructions (Signed)
 Handouts Provided:  Polyps  YOU HAD AN ENDOSCOPIC PROCEDURE TODAY AT THE [facility]:   Refer to the procedure report that was given to you for any specific questions about what was found during the examination.  If the procedure report does not answer your questions, please call your gastroenterologist to clarify.  If you requested that your care partner not be given the details of your procedure findings, then the procedure report has been included in a sealed envelope for you to review at your convenience later.  YOU SHOULD EXPECT: Some feelings of bloating in the abdomen. Passage of more gas than usual.  Walking can help get rid of the air that was put into your GI tract during the procedure and reduce the bloating. If you had a lower endoscopy (such as a colonoscopy or flexible sigmoidoscopy) you may notice spotting of blood in your stool or on the toilet paper. If you underwent a bowel prep for your procedure, you may not have a normal bowel movement for a few days.  Please Note:  You might notice some irritation and congestion in your nose or some drainage.  This is from the oxygen used during your procedure.  There is no need for concern and it should clear up in a day or so.  SYMPTOMS TO REPORT IMMEDIATELY:  Following lower endoscopy (colonoscopy or flexible sigmoidoscopy):  Excessive amounts of blood in the stool  Significant tenderness or worsening of abdominal pains  Swelling of the abdomen that is new, acute  Fever of 100F or higher  For urgent or emergent issues, a gastroenterologist can be reached at any hour by calling [phone]. Do not use MyChart messaging for urgent concerns.    DIET:  We do recommend a small meal at first, but then you may proceed to your regular diet.  Drink plenty of fluids but you should avoid alcoholic beverages for 24 hours.  ACTIVITY:  You should plan to take it easy for the rest of today and you should NOT DRIVE or use heavy  machinery until tomorrow (because of the sedation medicines used during the test).    FOLLOW UP: Our staff will call the number listed on your records the next business day following your procedure.  We will call around 7:15- 8:00 am to check on you and address any questions or concerns that you may have regarding the information given to you following your procedure. If we do not reach you, we will leave a message.     If any biopsies were taken you will be contacted by phone or by letter within the next 1-3 weeks.  Please call us at [phone] if you have not heard about the biopsies in 3 weeks.    SIGNATURES/CONFIDENTIALITY: You and/or your care partner have signed paperwork which will be entered into your electronic medical record.  These signatures attest to the fact that that the information above on your After Visit Summary has been reviewed and is understood.  Full responsibility of the confidentiality of this discharge information lies with you and/or your care-partner.

## 2024-02-20 NOTE — Progress Notes (Unsigned)
 Pt's states no medical or surgical changes since previsit or office visit.

## 2024-02-20 NOTE — Progress Notes (Unsigned)
 Report to PACU, RN, vss, BBS= Clear.

## 2024-02-20 NOTE — Progress Notes (Unsigned)
 Expand All Collapse All    Chief Complaint: Acute on chronic diarrhea Primary GI MD: Dr. Abran   HPI: 73 year old female with medical history as listed below presents for evaluation of acute on chronic diarrhea   She has experienced severe diarrhea for the past three weeks, which is a worsening of her usual chronic diarrhea. Typically, she manages her symptoms with Pepto Bismol tablets, but they have been ineffective recently, and her stools have turned black. She uses eight diapers in one morning due to the frequency of her bowel movements. Her diet is limited to liquids such as chicken noodle soup broth, water , zero sugar ginger ale, and salty crackers to avoid aggravating her symptoms. She experiences a constant, non-radiating pain around her periumbilical and lower abdomen, which does not improve after bowel movements. She also reports weakness and general malaise. No vomiting, but some nausea and no recent solid bowel movements.   She has a history of high triglycerides, currently at 729 mg/dL, and has experienced floating stools. She has tried fiber supplements in the past without success. She is allergic to shellfish, which has limited her medication options for managing her triglycerides. Her family history includes high triglycerides.   She has a history of stage 3B renal failure and spondylitis, for which she underwent surgery in 1994. She also mentions a recent shoulder injury from a fall, which was managed with physical therapy after initially being treated with a sling.   She has a personal history of being a nurse for 35 years. She wears diapers regularly due to her chronic diarrhea and has not undergone pelvic floor physical therapy or anorectal manometry.     PREVIOUS GI WORKUP    EGD 03/2021 - Normal esophagus. Dilated.  - Normal stomach. Biopsied.  - Normal examined duodenum.   Colonoscopy 03/2021 - Five 1 to 5 mm polyps in the descending colon, in the transverse colon, in  the ascending colon and in the cecum, removed with a cold snare. Resected and retrieved.  - Diverticulosis in the sigmoid colon.  - Internal hemorrhoids.   Diagnosis 1. Surgical [P], colon, cecum, ascending, polyp (3) - TUBULAR ADENOMA(S) - NEGATIVE FOR HIGH-GRADE DYSPLASIA OR MALIGNANCY 2. Surgical [P], colon nos, random sites - COLONIC MUCOSA WITH NO SPECIFIC HISTOPATHOLOGIC CHANGES - NEGATIVE FOR ACUTE INFLAMMATION, INCREASED INTRAEPITHELIAL LYMPHOCYTES OR THICKENED SUBEPITHELIAL COLLAGEN TABLE 3. Surgical [P], colon, transverse, descending, polyp (2) - TUBULAR ADENOMA(S) - NEGATIVE FOR HIGH-GRADE DYSPLASIA OR MALIGNANCY 4. Surgical [P], gastric antrum - GASTRIC ANTRAL MUCOSA WITH NO SPECIFIC HISTOPATHOLOGIC CHANGES - WARTHIN STARRY STAIN IS NEGATIVE FOR HELICOBACTER PYLORI       Past Medical History:  Diagnosis Date   Allergy      Anemia      pernicious anemia- recieved iron infusions until 10-2015   Anginal pain (HCC)     Anxiety     Arthritis      foot drop  lt foot  / toes   wears brace      Bronchiectasis (HCC)     Cancer (HCC)      SKIN     Cataract      REMOVED   Chronic cough     CKD (chronic kidney disease)     Colon polyps     Coronary artery calcification seen on CT scan 12/22/2017    CT in 2016   Depression     Diabetes mellitus     Dysphagia     Dysrhythmia      RAPID  HEART BEAT , CHEST TIGHTNESS  09/29/14      Gastritis     GERD (gastroesophageal reflux disease)     Hiatal hernia     History of echocardiogram      Echo 5/19: Mild focal basal septal hypertrophy, EF 60-65, normal wall motion, grade 1 diastolic dysfunction, mildly dilated ascending aorta (38 mm), normal RVSF, mild TR, PASP 23   Hyperlipidemia     Hypertension     IBS (irritable bowel syndrome)     Insomnia     Kidney stones      KIDNEY DISEASE   STAGE lll     Kidney stones     Macular disorder      MACULAR DISEASE  RT EYE    Meningitis     Osteoporosis     Pneumonia       BRONCHIECTASIS   Shortness of breath dyspnea      WITH EXERTION   Shoulder injury      05-2023 right did physical therapy for it   Sleep apnea      uses cpap   Spina bifida occulta     Ulcer                 Past Surgical History:  Procedure Laterality Date   ABDOMINAL HYSTERECTOMY       BACK SURGERY       CATARACT EXTRACTION   03/2012    bilateral   COLONOSCOPY       eardrum        left   ruptured and repaired   ELBOW SURGERY        left   fundoplication       KNEE ARTHROSCOPY        rt knee    LEFT HEART CATH AND CORONARY ANGIOGRAPHY N/A 12/25/2017    Procedure: LEFT HEART CATH AND CORONARY ANGIOGRAPHY;  Surgeon: Mady Bruckner, MD;  Location: [facility];  Service: Cardiovascular;  Laterality: N/A;   LITHOTRIPSY       MENISCUS REPAIR       POLYPECTOMY       TONSILLECTOMY       TOTAL KNEE ARTHROPLASTY Right 10/11/2014    Procedure: RIGHT TOTAL KNEE ARTHROPLASTY;  Surgeon: Bruckner CINDERELLA Poli, MD;  Location: [facility];  Service: Orthopedics;  Laterality: Right;                Current Outpatient Medications  Medication Sig Dispense Refill   albuterol  (VENTOLIN  HFA) 108 (90 Base) MCG/ACT inhaler Inhale 1-2 puffs into the lungs every 6 (six) hours as needed for wheezing or shortness of breath. 8 g 2   amLODipine  (NORVASC ) 2.5 MG tablet Take one tablet by mouth daily in addition to your Amlodipine  5mg  for a total of 7.5mg  once daily 90 tablet 0   amLODipine  (NORVASC ) 5 MG tablet TAKE 1 AND 1/2 TABLETS BY MOUTH  DAILY 135 tablet 3   atorvastatin  (LIPITOR) 40 MG tablet Take 1 tablet (40 mg total) by mouth daily. 90 tablet 3   Blood Glucose Monitoring Suppl (ONETOUCH VERIO FLEX SYSTEM) w/Device KIT USE AS DIRECTED TO CHECK BLOOD SUGAR 2-3 TIMES DAILY. Dx: E11.29       Blood Glucose Monitoring Suppl (ONETOUCH VERIO REFLECT) w/Device KIT USE AS DIRECTED TO CHECK BLOOD SUGAR TWICE TO THREE TIMES DAILY (DX: ICD10: E11.65)       calcium  carbonate (OS-CAL) 600 MG TABS tablet  Take 600 mg by mouth 3 (three) times daily with  meals.       Cholecalciferol  (VITAMIN D  PO) Take 5,000 Units by mouth daily.        diazepam  (VALIUM ) 10 MG tablet Take 10 mg by mouth at bedtime as needed for anxiety or sleep.        dicyclomine  (BENTYL ) 20 MG tablet Take 1 tablet (20 mg total) by mouth every 6 (six) hours as needed for spasms. 100 tablet 3   diphenhydrAMINE  (BENADRYL ) 25 mg capsule Take 25 mg by mouth every 6 (six) hours as needed for allergies.       DULoxetine  (CYMBALTA ) 60 MG capsule TAKE 1 CAPSULE BY MOUTH IN  THE EVENING WITH FOOD       EPINEPHrine  0.3 mg/0.3 mL IJ SOAJ injection Use as directed Injection for 30 days       estradiol (ESTRACE) 0.1 MG/GM vaginal cream SMARTSIG:1 Applicator Vaginal Every Other Day       fluticasone  (FLONASE ) 50 MCG/ACT nasal spray SHAKE LIQUID AND USE 1 SPRAY IN EACH NOSTRIL DAILY 16 g 5   furosemide (LASIX) 40 MG tablet Take 20 mg by mouth daily as needed for fluid or edema.        hydrALAZINE  (APRESOLINE ) 50 MG tablet TAKE 1 TABLET BY MOUTH 3 TIMES  DAILY 270 tablet 3   HYDROcodone -acetaminophen  (NORCO/VICODIN) 5-325 MG tablet Take 1 tablet by mouth every 8 (eight) hours as needed for severe pain (pain score 7-10). 30 tablet 0   loperamide (IMODIUM) 2 MG capsule Take 2 mg by mouth as needed for diarrhea or loose stools.       loratadine  (CLARITIN ) 10 MG tablet Take 10 mg by mouth daily as needed for allergies.        Meclizine  HCl 25 MG CHEW take one tab by mouth every 6 hrs as needed for vertigo       metFORMIN  (GLUCOPHAGE -XR) 500 MG 24 hr tablet 500 mg daily. (Patient not taking: Reported on 02/02/2024)       metoprolol  succinate (TOPROL -XL) 50 MG 24 hr tablet Take 1 tablet (50 mg total) by mouth 2 (two) times daily. Take with or immediately following a meal. 180 tablet 3   ondansetron  (ZOFRAN -ODT) 4 MG disintegrating tablet Take 1 tablet (4 mg total) by mouth every 8 (eight) hours as needed for nausea or vomiting. 20 tablet 0   Polyethyl  Glycol-Propyl Glycol (SYSTANE) 0.4-0.3 % GEL ophthalmic gel See admin instructions.       Polyethyl Glycol-Propyl Glycol 0.4-0.3 % SOLN Place 2 drops into both eyes 2 (two) times daily.        promethazine  (PHENERGAN ) 25 MG tablet Take 25 mg by mouth every 6 (six) hours as needed for nausea or vomiting.        sodium chloride  HYPERTONIC 3 % nebulizer solution Take by nebulization 2 (two) times daily. 720 mL 3      No current facility-administered medications for this visit.             Allergies as of 02/02/2024 - Review Complete 02/02/2024  Allergen Reaction Noted   Ambien [zolpidem tartrate] Other (See Comments) 09/07/2012   Amoxicillin  Anaphylaxis 07/09/2023   Aspirin  Other (See Comments) 09/07/2012   Benicar [olmesartan medoxomil] Shortness Of Breath, Swelling, and Other (See Comments) 11/24/2011   Codeine Nausea And Vomiting     Erythromycin Nausea And Vomiting     Keflex [cephalexin] Anaphylaxis 07/09/2023   Ketorolac Anaphylaxis 12/15/2015   Ketorolac tromethamine Anaphylaxis 11/24/2011   Latex Shortness Of Breath and Itching 11/09/2013  Lisinopril Shortness Of Breath, Swelling, and Other (See Comments)     Olmesartan Other (See Comments), Shortness Of Breath, and Swelling 12/15/2015   Other Anaphylaxis 11/09/2013   Reclast [zoledronic acid] Other (See Comments) 09/07/2012   Shellfish allergy  Anaphylaxis 07/09/2023   Toradol [ketorolac tromethamine] Anaphylaxis 09/07/2012   Zolpidem Other (See Comments) 12/15/2015   Fenofibrate Nausea And Vomiting 09/07/2012   Glucosamine Diarrhea and Nausea Only 12/15/2015   Glucosamine forte [nutritional supplements] Diarrhea and Nausea Only 09/07/2012   Levaquin [levofloxacin in d5w] Diarrhea and Swelling 09/07/2012   Levofloxacin Diarrhea and Swelling 12/15/2015   Percocet [oxycodone -acetaminophen ] Nausea And Vomiting 09/07/2012   Tramadol  Nausea And Vomiting 09/07/2012   Atorvastatin  Other (See Comments) 10/12/2020   Lomotil  [diphenoxylate-atropine] Other (See Comments) 03/29/2016   Lopid  [gemfibrozil ] Diarrhea 04/30/2023   Metformin  hcl Other (See Comments) 10/12/2020   Penicillins   04/10/2023   Tizanidine hcl Other (See Comments) 10/12/2020   Venlafaxine Other (See Comments) 10/12/2020   Tizanidine Nausea And Vomiting 09/07/2012           Family History  Problem Relation Age of Onset   Alzheimer's disease Father     Irritable bowel syndrome Father     Ulcers Father     Gout Father     Cancer Mother          ?   Diabetes Mother     Hypertension Mother     Nephrolithiasis Mother     Colon cancer Mother     Ovarian cancer Mother     Coronary artery disease Other          grandparent   Thyroid cancer Sister     Nephrolithiasis Brother     Colon cancer Maternal Grandmother     Heart attack Maternal Grandmother 42   Esophageal cancer Cousin     Esophageal cancer Cousin     Heart attack Maternal Grandfather 38   Colon polyps Neg Hx     Rectal cancer Neg Hx     Stomach cancer Neg Hx            Social History         Socioeconomic History   Marital status: Married      Spouse name: Not on file   Number of children: 2   Years of education: Not on file   Highest education level: Not on file  Occupational History   Occupation: Retired      Comment: LPN  Tobacco Use   Smoking status: Never   Smokeless tobacco: Never  Vaping Use   Vaping status: Never Used  Substance and Sexual Activity   Alcohol  use: No      Comment: OCC.   Drug use: No   Sexual activity: Not Currently  Other Topics Concern   Not on file  Social History Narrative   Not on file    Social Drivers of Health        Financial Resource Strain: Not on file  Food Insecurity: Not on file  Transportation Needs: Not on file  Physical Activity: Not on file  Stress: Not on file  Social Connections: Unknown (07/02/2023)    Received from [facility]    Social Network     Social Network: Not on file  Intimate Partner  Violence: Not At Risk (07/02/2023)    Received from [facility]    HITS     Over the last 12 months how often did your partner physically hurt you?: Never  Over the last 12 months how often did your partner insult you or talk down to you?: Never     Over the last 12 months how often did your partner threaten you with physical harm?: Never     Over the last 12 months how often did your partner scream or curse at you?: Never      Review of Systems:    Constitutional: No weight loss, fever, chills, weakness or fatigue HEENT: Eyes: No change in vision               Ears, Nose, Throat:  No change in hearing or congestion Skin: No rash or itching Cardiovascular: No chest pain, chest pressure or palpitations   Respiratory: No SOB or cough Gastrointestinal: See HPI and otherwise negative Genitourinary: No dysuria or change in urinary frequency Neurological: No headache, dizziness or syncope Musculoskeletal: No new muscle or joint pain Hematologic: No bleeding or bruising Psychiatric: No history of depression or anxiety      Physical Exam:  Vital signs: BP 130/74   Pulse 74   Wt 177 lb 8 oz (80.5 kg)   BMI 31.44 kg/m    Constitutional: NAD, alert and cooperative Head:  Normocephalic and atraumatic. Eyes:   PEERL, EOMI. No icterus. Conjunctiva pink. Respiratory: Respirations even and unlabored. Lungs clear to auscultation bilaterally.   No wheezes, crackles, or rhonchi.  Cardiovascular:  Regular rate and rhythm. No peripheral edema, cyanosis or pallor.  Gastrointestinal:  Soft, nondistended, nontender. No rebound or guarding.  Hypoactive bowel sounds. No appreciable masses or hepatomegaly. Rectal:  Declines Msk:  Symmetrical without gross deformities. Without edema, no deformity or joint abnormality.  Neurologic:  Alert and  oriented x4;  grossly normal neurologically.  Skin:   Dry and intact without significant lesions or rashes. Psychiatric: Oriented to person, place and time.  Demonstrates good judgement and reason without abnormal affect or behaviors.     RELEVANT LABS AND IMAGING: CBC Labs (Brief)          Component Value Date/Time    WBC 11.1 (H) 09/01/2021 1947    RBC 3.40 (L) 09/01/2021 1947    HGB 10.2 (L) 09/01/2021 1947    HGB 11.3 11/27/2020 0952    HCT 32.7 (L) 09/01/2021 1947    HCT 34.3 11/27/2020 0952    PLT 188 09/01/2021 1947    PLT 240 11/27/2020 0952    MCV 96.2 09/01/2021 1947    MCV 91 11/27/2020 0952    MCH 30.0 09/01/2021 1947    MCHC 31.2 09/01/2021 1947    RDW 12.9 09/01/2021 1947    RDW 12.3 11/27/2020 0952    LYMPHSABS 1.3 09/01/2021 1947    MONOABS 0.6 09/01/2021 1947    EOSABS 0.1 09/01/2021 1947    BASOSABS 0.1 09/01/2021 1947        CMP     Labs (Brief)          Component Value Date/Time    NA 139 09/01/2021 1947    NA 144 11/27/2020 0952    K 3.7 09/01/2021 1947    CL 104 09/01/2021 1947    CO2 22 09/01/2021 1947    GLUCOSE 225 (H) 09/01/2021 1947    BUN 30 (H) 09/01/2021 1947    BUN 25 11/27/2020 0952    CREATININE 2.29 (H) 09/01/2021 1947    CALCIUM  8.3 (L) 09/01/2021 1947    PROT 7.1 06/12/2023 0900    ALBUMIN 4.4 06/12/2023 0900    AST 17 06/12/2023 0900  ALT 12 06/12/2023 0900    ALKPHOS 123 (H) 06/12/2023 0900    BILITOT 0.3 06/12/2023 0900    GFRNONAA 23 (L) 09/01/2021 1947    GFRAA 46 (L) 07/26/2019 1444          Assessment/Plan:    Acute on chronic diarrhea Lower abdominal pain Diarrhea ongoing for 12 years becoming more severe over the last 3 weeks.  Recent Macrobid use for UTI.  Often has fecal incontinence.  Colonoscopy in 2019 negative for microscopic colitis.  Triglycerides of 792 with floating stools and hx of diabetes. Suspect infectious etiology with acute onset.  The multiple DDx could be considered for her chronic diarrhea including pancreatic insufficiency, pelvic floor dysfunction, or even overflow diarrhea with her hypoactive bowel sounds on exam today. - C. difficile and  GI pathogen Diatherix to rule out infection - CBC, CMP - She is due for her colonoscopy, will go ahead and schedule her colonoscopy today - If stool studies are negative and colonoscopy is unrevealing consider pelvic floor physical therapy. - Fecal elastase would likely be abnormal in the setting of watery stools but remains on differential with history of diabetes - Follow-up with me in 12 weeks   GERD s/p fundoplication Dysphagia Normal EGD 03/2021 with dilation and negative biopsies.   History of colon polyps Colonoscopy 03/2021 with FIVE 1-5 mm tubular adenomas and a recall of 3 years (03/2024) - Colonoscopy - I thoroughly discussed the procedure with the patient (at bedside) to include nature of the procedure, alternatives, benefits, and risks (including but not limited to bleeding, infection, perforation, anesthesia/cardiac pulmonary complications).  Patient verbalized understanding and gave verbal consent to proceed with procedure.        Nestor Blower, PA-C [facility] 02/02/2024, 3:41 PM

## 2024-02-20 NOTE — Op Note (Signed)
 [facility] Patient Name: Tiffany [name] Procedure Date: 02/20/2024 4:05 PM MRN: [id number] Endoscopist: Norleen SAILOR. Abran , MD, [id number] Age: 73 Referring MD:  Date of Birth: [birth year] Gender: Female Account #: [id number] Procedure:                Colonoscopy with cold snare polypectomy x 5; with                            biopsies Indications:              High risk colon cancer surveillance: Personal                            history of multiple (3 or more) adenomas. Previous                            examinations 2006, 2009, 2014, 2017, 2022. Also,                            recently seen in the office for worsening chronic                            diarrhea Medicines:                Monitored Anesthesia Care Procedure:                Pre-Anesthesia Assessment:                           - Prior to the procedure, a History and Physical                            was performed, and patient medications and                            allergies were reviewed. The patient's tolerance of                            previous anesthesia was also reviewed. The risks                            and benefits of the procedure and the sedation                            options and risks were discussed with the patient.                            All questions were answered, and informed consent                            was obtained. Prior Anticoagulants: The patient has                            taken no anticoagulant or antiplatelet agents. ASA  Grade Assessment: II - A patient with mild systemic                            disease. After reviewing the risks and benefits,                            the patient was deemed in satisfactory condition to                            undergo the procedure.                           After obtaining informed consent, the colonoscope                            was passed under direct vision. Throughout the                             procedure, the patient's blood pressure, pulse, and                            oxygen saturations were monitored continuously. The                            CF [id number] #[id number] was introduced through the anus                            and advanced to the the cecum, identified by                            appendiceal orifice and ileocecal valve. The                            ileocecal valve, appendiceal orifice, and rectum                            were photographed. The quality of the bowel                            preparation was excellent. The colonoscopy was                            performed without difficulty. The patient tolerated                            the procedure well. The bowel preparation used was                            SUPREP via split dose instruction. Scope In: 4:24:00 PM Scope Out: 4:41:46 PM Scope Withdrawal Time: 0 hours 13 minutes 41 seconds  Total Procedure Duration: 0 hours 17 minutes 46 seconds  Findings:                 Five polyps were found in the descending colon,  transverse colon and ascending colon. The polyps                            were 1 to 5 mm in size. These polyps were removed                            with a cold snare. Resection and retrieval were                            complete.                           Multiple diverticula were found in the sigmoid                            colon.                           Internal hemorrhoids were found during retroflexion.                           The exam was otherwise without abnormality on                            direct and retroflexion views.                           Biopsies for histology were taken with a cold                            forceps from the entire colon for evaluation of                            microscopic colitis. Complications:            No immediate complications. Estimated blood loss:                             None. Estimated Blood Loss:     Estimated blood loss: none. Impression:               - Five 1 to 5 mm polyps in the descending colon, in                            the transverse colon and in the ascending colon,                            removed with a cold snare. Resected and retrieved.                           - Diverticulosis in the sigmoid colon.                           - Internal hemorrhoids.                           -  The examination was otherwise normal on direct                            and retroflexion views.                           - Biopsies were taken with a cold forceps from the                            entire colon for evaluation of microscopic colitis. Recommendation:           - Repeat colonoscopy in 5 years for surveillance.                           - Patient has a contact number available for                            emergencies. The signs and symptoms of potential                            delayed complications were discussed with the                            patient. Return to normal activities tomorrow.                            Written discharge instructions were provided to the                            patient.                           - Resume previous diet.                           - Continue present medications.                           - Await pathology results. Norleen SAILOR. Abran, MD 02/20/2024 4:49:38 PM This report has been signed electronically.

## 2024-02-23 ENCOUNTER — Telehealth: Payer: Self-pay

## 2024-02-23 NOTE — Telephone Encounter (Signed)
Left HIPAA compliant voicemail. 

## 2024-02-26 ENCOUNTER — Ambulatory Visit: Payer: Self-pay | Admitting: Internal Medicine

## 2024-02-26 LAB — SURGICAL PATHOLOGY

## 2024-03-17 ENCOUNTER — Other Ambulatory Visit: Payer: Self-pay | Admitting: Internal Medicine

## 2024-03-17 ENCOUNTER — Telehealth: Payer: Self-pay | Admitting: Gastroenterology

## 2024-03-17 DIAGNOSIS — R109 Unspecified abdominal pain: Secondary | ICD-10-CM

## 2024-03-17 MED ORDER — COLESTIPOL HCL 1 G PO TABS: 2.0000 g | ORAL_TABLET | Freq: Two times a day (BID) | ORAL | 1 refills | Status: DC

## 2024-03-17 MED ORDER — DICYCLOMINE HCL 20 MG PO TABS: 20.0000 mg | ORAL_TABLET | Freq: Four times a day (QID) | ORAL | 1 refills | Status: AC | PRN

## 2024-03-17 NOTE — Telephone Encounter (Signed)
 I have spoken to patient and advised that we will send dicyclomine  refills to her pharmacy and will add colestid  2 grams twice daily to help diarrhea. Advised not to take colestid  within 2 hours of any other medications. Patient verbalizes understanding.

## 2024-03-17 NOTE — Telephone Encounter (Signed)
 Inbound call to discuss symptoms. PT waas put on Benefiber and it worked for about a week and she still had diarrhea so she stopped taking it. She had a prescription for dicyclomine  20mg  and said that it made a difference, but it has expired and she would need a refill sent to [facility] on [address]. Lastly, she was told by her PCP that her triglycerides are extremely high. After researching, she found that this could mean an issue with her liver especially if the stool is yellowish- orange. She is very concerned. Please advise.

## 2024-03-17 NOTE — Telephone Encounter (Signed)
 1.  Prescribe dicyclomine  as needed 2.  Prescribe Colestid  2 g p.o. twice daily.  Not to be taken within 2 hours of other medications.  If constipation occurs, decrease dosage

## 2024-03-17 NOTE — Telephone Encounter (Signed)
 Patient continues to complain of watery diarrhea with 8-10 bowel movements daily. States she has tried imodium, pepto bismol, and benefiber daily without relief. Though her diarrhea is somewhat chronic, she has had increased amounts over the last several weeks. +episodes of fecal incontinence. Notes that she has some RUQ abdominal discomfort under the ribcage at times, but no other pain. Denies any rectal bleeding. No N/V. Did try an old Rx of dicyclomine  20 mg which seems to have slowed the diarrhea but only minimally. States she has cut out dairy and other known diarrhea triggers like salads, apples etc.  Patient is also concerned because she was told that her triglycerides were extremely elevated by her PCP and she was told it can effect the liver.  Patient colonoscopy 02/20/24 was negative for microscopic colitis; GI pathogen panel 02/04/24 was negative as well.  May I give patient a refill on dicyclomine  20 mg?  Do you think she could benefit from something like colestid  or cholestyramine?  Please advise.

## 2024-03-29 ENCOUNTER — Other Ambulatory Visit: Payer: Self-pay | Admitting: Internal Medicine

## 2024-04-12 ENCOUNTER — Ambulatory Visit: Admitting: Internal Medicine

## 2024-05-11 ENCOUNTER — Telehealth: Payer: Self-pay | Admitting: Internal Medicine

## 2024-05-11 NOTE — Telephone Encounter (Signed)
 Inbound call from pt stating that she has not noticed any changes with her diarrhea since taking her medication. Patient also stated that she feels drained and does not know what she can do to get her diarrhea to stop. Patient is requesting a call back. Please advise.

## 2024-05-12 NOTE — Telephone Encounter (Signed)
 Left message for pt to call back

## 2024-05-14 NOTE — Telephone Encounter (Signed)
 Pt did not return the call, will await further communication from pt.

## 2024-05-24 ENCOUNTER — Other Ambulatory Visit: Payer: Self-pay | Admitting: Internal Medicine

## 2024-05-31 ENCOUNTER — Other Ambulatory Visit (HOSPITAL_BASED_OUTPATIENT_CLINIC_OR_DEPARTMENT_OTHER): Payer: Self-pay

## 2024-05-31 MED ORDER — FLUZONE HIGH-DOSE 0.5 ML IM SUSY
0.5000 mL | PREFILLED_SYRINGE | Freq: Once | INTRAMUSCULAR | 0 refills | Status: AC
  Filled 2024-05-31: qty 0.5, 1d supply, fill #0

## 2024-05-31 MED ORDER — COMIRNATY 30 MCG/0.3ML IM SUSY
0.3000 mL | PREFILLED_SYRINGE | Freq: Once | INTRAMUSCULAR | 0 refills | Status: AC
  Filled 2024-05-31: qty 0.3, 1d supply, fill #0

## 2024-06-14 ENCOUNTER — Other Ambulatory Visit: Payer: Self-pay | Admitting: Internal Medicine

## 2024-06-20 NOTE — Progress Notes (Signed)
 Cardiology Office Note   Date:  06/25/2024   ID:  Tiffany [name], DOB 02-20-1951, MRN [id number]  PCP:  Janey Santos, MD  Cardiologist:   Vina Gull, MD   Pt presents for f/u of HTN ad palpitations    History of Present Illness: Tiffany [name] is a 73 y.o. female with a history of CP, HTN< palpitations, bronchiectaisis, achalasia,  spina bifida, achalasia,   2016  Myovue was normal    2019  LHC  Normal coronary arteries  LVEDP 17 2019  Echo   Normal LVEF   Mild diastolic dysfunction   Rec Rx lasix prn    I saw the pt in AUg 2024  Since seen the pt says she is doing good from a cardiac standpoint   She denies CP   Denies SOB     Her biggest problem is balance   She has fallen many times    Uses mainly a cane, occasionally a walker      Outpatient Medications Prior to Visit  Medication Sig Dispense Refill   albuterol  (VENTOLIN  HFA) 108 (90 Base) MCG/ACT inhaler Inhale 1-2 puffs into the lungs every 6 (six) hours as needed for wheezing or shortness of breath. 8 g 2   amLODipine  (NORVASC ) 2.5 MG tablet TAKE 1 TABLET BY MOUTH DAILY IN  ADDITION TO YOUR AMLODIPINE  5 MG FOR A TOTAL OF 7.5 MG ONCE DAILY 90 tablet 3   amLODipine  (NORVASC ) 5 MG tablet TAKE 1 TABLET BY MOUTH DAILY IN  ADDITION TO YOUR AMLODIPINE   2.5MG  FOR A TOTAL OF 7.5MG  ONCE  DAILY 90 tablet 3   atorvastatin  (LIPITOR) 40 MG tablet Take 1 tablet (40 mg total) by mouth daily. 90 tablet 3   calcium  carbonate (OS-CAL) 600 MG TABS tablet Take 600 mg by mouth 3 (three) times daily with meals.     Cholecalciferol  (VITAMIN D  PO) Take 5,000 Units by mouth daily.      Cranberry 4200 MG CAPS as directed Orally twice a day     diazepam  (VALIUM ) 10 MG tablet Take 10 mg by mouth at bedtime as needed for anxiety or sleep.      dicyclomine  (BENTYL ) 20 MG tablet Take 1 tablet (20 mg total) by mouth every 6 (six) hours as needed for spasms. 100 tablet 1   diphenhydrAMINE  (BENADRYL ) 25 mg capsule Take 25 mg by mouth  every 6 (six) hours as needed for allergies.     DULoxetine  (CYMBALTA ) 60 MG capsule TAKE 1 CAPSULE BY MOUTH IN  THE EVENING WITH FOOD     EPINEPHrine  0.3 mg/0.3 mL IJ SOAJ injection Use as directed Injection for 30 days     fluticasone  (FLONASE ) 50 MCG/ACT nasal spray SHAKE LIQUID AND USE 1 SPRAY IN EACH NOSTRIL DAILY 16 g 5   furosemide (LASIX) 40 MG tablet Take 20 mg by mouth daily as needed for fluid or edema.      hydrALAZINE  (APRESOLINE ) 50 MG tablet TAKE 1 TABLET BY MOUTH 3 TIMES  DAILY 90 tablet 0   loperamide (IMODIUM) 2 MG capsule Take 2 mg by mouth as needed for diarrhea or loose stools.     loratadine  (CLARITIN ) 10 MG tablet Take 10 mg by mouth daily as needed for allergies.      meclizine  (ANTIVERT ) 25 MG tablet Take 25 mg by mouth as needed for dizziness.     metoprolol  succinate (TOPROL -XL) 50 MG 24 hr tablet Take 1 tablet (50 mg total) by mouth 2 (two) times  daily. Take with or immediately following a meal. (Patient taking differently: Take 50 mg by mouth 3 (three) times daily. Take with or immediately following a meal.) 180 tablet 3   Polyethyl Glycol-Propyl Glycol (SYSTANE) 0.4-0.3 % GEL ophthalmic gel See admin instructions.     Polyethyl Glycol-Propyl Glycol 0.4-0.3 % SOLN Place 2 drops into both eyes 2 (two) times daily.      promethazine  (PHENERGAN ) 25 MG tablet Take 25 mg by mouth as needed for nausea or vomiting.     sodium chloride  HYPERTONIC 3 % nebulizer solution Take by nebulization 2 (two) times daily. (Patient taking differently: Take by nebulization as needed.) 720 mL 3   estradiol (ESTRACE) 0.1 MG/GM vaginal cream SMARTSIG:1 Applicator Vaginal Every Other Day     ondansetron  (ZOFRAN -ODT) 4 MG disintegrating tablet Take 1 tablet (4 mg total) by mouth every 8 (eight) hours as needed for nausea or vomiting. 20 tablet 0   Blood Glucose Monitoring Suppl (ONETOUCH VERIO FLEX SYSTEM) w/Device KIT USE AS DIRECTED TO CHECK BLOOD SUGAR 2-3 TIMES DAILY. Dx: E11.29     Blood  Glucose Monitoring Suppl (ONETOUCH VERIO REFLECT) w/Device KIT USE AS DIRECTED TO CHECK BLOOD SUGAR TWICE TO THREE TIMES DAILY (DX: ICD10: E11.65)     colestipol  (COLESTID ) 1 g tablet Take 2 tablets (2 g total) by mouth 2 (two) times daily. Do not take colestipol  within 2 hours of any other medication 120 tablet 1   HYDROcodone -acetaminophen  (NORCO/VICODIN) 5-325 MG tablet Take 1 tablet by mouth every 8 (eight) hours as needed for severe pain (pain score 7-10). (Patient not taking: Reported on 02/20/2024) 30 tablet 0   Meclizine  HCl 25 MG CHEW take one tab by mouth every 6 hrs as needed for vertigo     metFORMIN  (GLUCOPHAGE -XR) 500 MG 24 hr tablet 500 mg daily. (Patient not taking: Reported on 02/20/2024)     promethazine  (PHENERGAN ) 25 MG tablet Take 25 mg by mouth every 6 (six) hours as needed for nausea or vomiting.      No facility-administered medications prior to visit.     Allergies:   Ambien [zolpidem tartrate], Amoxicillin , Aspirin , Benicar [olmesartan medoxomil], Cat dander, Codeine, Erythromycin, Keflex [cephalexin], Ketorolac, Ketorolac tromethamine, Latex, Lisinopril, Olmesartan, Other, Reclast [zoledronic acid], Shellfish allergy , Toradol [ketorolac tromethamine], Zolpidem, Fenofibrate, Glucosamine, Glucosamine forte [nutritional supplements], Levaquin [levofloxacin in d5w], Levofloxacin, Percocet [oxycodone -acetaminophen ], Tramadol , Atorvastatin , Lomotil [diphenoxylate-atropine], Lopid  [gemfibrozil ], Metformin  hcl, Penicillins, Tizanidine hcl, Venlafaxine, and Tizanidine   Past Medical History:  Diagnosis Date   Allergy     Anemia    pernicious anemia- recieved iron infusions until 10-2015   Anginal pain    Anxiety    Arthritis    foot drop  lt foot  / toes   wears brace      Bronchiectasis (HCC)    Cancer (HCC)    SKIN     Cataract    REMOVED   Chronic cough    CKD (chronic kidney disease)    Colon polyps    Coronary artery calcification seen on CT scan 12/22/2017   CT in  2016   Depression    Diabetes mellitus    Dysphagia    Dysrhythmia    RAPID HEART BEAT , CHEST TIGHTNESS  09/29/14      Gastritis    GERD (gastroesophageal reflux disease)    Hiatal hernia    History of echocardiogram    Echo 5/19: Mild focal basal septal hypertrophy, EF 60-65, normal wall motion, grade 1 diastolic dysfunction, mildly dilated  ascending aorta (38 mm), normal RVSF, mild TR, PASP 23   Hyperlipidemia    Hypertension    IBS (irritable bowel syndrome)    Insomnia    Kidney stones    KIDNEY DISEASE   STAGE lll     Kidney stones    Macular disorder    MACULAR DISEASE  RT EYE    Meningitis    Osteoporosis    Pneumonia    BRONCHIECTASIS   Shortness of breath dyspnea    WITH EXERTION   Shoulder injury 07/09/2023   05-2023 right did physical therapy for it   Sleep apnea    uses cpap   Spina bifida occulta    born with it surgery 1994   Ulcer     Past Surgical History:  Procedure Laterality Date   ABDOMINAL HYSTERECTOMY     BACK SURGERY     CATARACT EXTRACTION  03/2012   bilateral   COLONOSCOPY     eardrum     left   ruptured and repaired   ELBOW SURGERY     left   fundoplication     KNEE ARTHROSCOPY     rt knee    LEFT HEART CATH AND CORONARY ANGIOGRAPHY N/A 12/25/2017   Procedure: LEFT HEART CATH AND CORONARY ANGIOGRAPHY;  Surgeon: Mady Bruckner, MD;  Location: [facility];  Service: Cardiovascular;  Laterality: N/A;   LITHOTRIPSY     MENISCUS REPAIR     POLYPECTOMY     TONSILLECTOMY     TOTAL KNEE ARTHROPLASTY Right 10/11/2014   Procedure: RIGHT TOTAL KNEE ARTHROPLASTY;  Surgeon: Bruckner CINDERELLA Poli, MD;  Location[facility];  Service: Orthopedics;  Laterality: Right;     Social History:  The patient  reports that she has never smoked. She has never used smokeless tobacco. She reports current alcohol  use. She reports that she does not use drugs.   Family History:  The patient's family history includes Alzheimer's disease in her father; Cancer  in her mother; Colon cancer in her maternal grandmother and mother; Coronary artery disease in an other family member; Diabetes in her mother; Esophageal cancer in her cousin and cousin; Gout in her father; Heart attack (age of onset: 1) in her maternal grandmother; Heart attack (age of onset: 38) in her maternal grandfather; Hypertension in her mother; Irritable bowel syndrome in her father; Nephrolithiasis in her brother and mother; Ovarian cancer in her mother; Thyroid cancer in her sister; Ulcers in her father.    ROS:  Please see the history of present illness. All other systems are reviewed and  Negative to the above problem except as noted.    PHYSICAL EXAM: VS:  BP 130/80 (BP Location: Right Arm, Patient Position: Sitting)   Pulse 73   Resp (!) 95   Ht 5' 3 (1.6 m)   Wt 175 lb 8 oz (79.6 kg)   BMI 31.09 kg/m   GEN:  Obese 73 yo female in NAD HEENT: normal  Neck: no JVD, No carotid bruits Cardiac: RRR; no murmurs  Respiratory:  CTA  GI: soft, nontender,No hepatomegaly  Ext  No LE edema    EKG:  EKG is ordered  NSR 73 bpm   Lipid Panel    Component Value Date/Time   CHOL 165 02/29/2016 0819   TRIG 279 (H) 02/29/2016 0819   HDL 42 (L) 02/29/2016 0819   CHOLHDL 3.9 02/29/2016 0819   VLDL 56 (H) 02/29/2016 0819   LDLCALC 67 02/29/2016 0819   LDLDIRECT  94.0 05/05/2015 1038      Wt Readings from Last 3 Encounters:  06/25/24 175 lb 8 oz (79.6 kg)  02/20/24 177 lb (80.3 kg)  02/02/24 177 lb 8 oz (80.5 kg)      ASSESSMENT AND PLAN:  1  HTN   BP is controlled       2  Palpitations  Pt denies palpitations   3 HL   lipids in March LDL 70  Trig 727  HDL 43   A1C was 6.3 at the time  She is allergic to shellfish   Gemfibrozil  gives her hives    Will review with pharmacy    4   Renal   Follows in renal clinic   Last Cr in Sept 2025 was 2.0     5  Falls   Will send referral for [facility] in neuro clinic    F/U in 1 year       Signed, Vina Gull, MD   06/25/2024 3:26 PM    [facility]

## 2024-06-25 ENCOUNTER — Ambulatory Visit: Attending: Internal Medicine | Admitting: Internal Medicine

## 2024-06-25 ENCOUNTER — Encounter: Payer: Self-pay | Admitting: Internal Medicine

## 2024-06-25 VITALS — BP 130/80 | HR 73 | Resp 95 | Ht 63.0 in | Wt 175.5 lb

## 2024-06-25 DIAGNOSIS — I10 Essential (primary) hypertension: Secondary | ICD-10-CM | POA: Diagnosis not present

## 2024-06-25 DIAGNOSIS — I2510 Atherosclerotic heart disease of native coronary artery without angina pectoris: Secondary | ICD-10-CM | POA: Diagnosis not present

## 2024-06-25 DIAGNOSIS — R2689 Other abnormalities of gait and mobility: Secondary | ICD-10-CM

## 2024-06-25 NOTE — Patient Instructions (Signed)
 Medication Instructions:  Your physician recommends that you continue on your current medications as directed. Please refer to the Current Medication list given to you today.  *If you need a refill on your cardiac medications before your next appointment, please call your pharmacy*  Lab Work: None ordered.  You may go to any [facility] Location for your lab work:  [facility] - [address] ([facility]) - [address] [phone] [address]  [place] - [address]   [place]  - [address]   [place] - [address] - [address] [place]  - [address] [place] [address] ([facility]   If you have labs (blood work) drawn today and your tests are completely normal, you will receive your results only by: [employer] (if you have MyChart)  If you have any lab test that is abnormal or we need to change your treatment, we will call you or send a MyChart message to review the results.  Testing/Procedures: None ordered.  Follow-Up: At [facility], you and your health needs are our priority.  As part of our continuing mission to provide you with exceptional heart care, we have created designated Provider Care Teams.  These Care Teams include your primary Cardiologist (physician) and Advanced Practice Providers (APPs -  Physician Assistants and Nurse Practitioners) who all work together to provide you with the care you need, when you need it.  We recommend signing up for the patient portal called MyChart.  Sign up information is provided on this After Visit Summary.  MyChart is used to connect with patients for Virtual Visits (Telemedicine).  Patients are able to view lab/test results, encounter notes, upcoming appointments, etc.  Non-urgent messages can be sent to your provider as well.   To learn more about what you can do with MyChart, go to  [email or web address].    Your next appointment:   You have been referred to [facility]. They will call to schedule your appointment.

## 2024-06-28 ENCOUNTER — Encounter: Payer: Self-pay | Admitting: Radiology

## 2024-07-02 ENCOUNTER — Other Ambulatory Visit: Payer: Self-pay

## 2024-07-02 DIAGNOSIS — R296 Repeated falls: Secondary | ICD-10-CM

## 2024-07-02 NOTE — Progress Notes (Signed)
 Per Dr. Okey: referral for [facility] therapy put in at neuro area ([address])

## 2024-07-06 ENCOUNTER — Other Ambulatory Visit (HOSPITAL_BASED_OUTPATIENT_CLINIC_OR_DEPARTMENT_OTHER): Payer: Self-pay

## 2024-07-07 ENCOUNTER — Ambulatory Visit: Admitting: Orthopaedic Surgery

## 2024-07-07 ENCOUNTER — Other Ambulatory Visit (INDEPENDENT_AMBULATORY_CARE_PROVIDER_SITE_OTHER)

## 2024-07-07 ENCOUNTER — Encounter: Payer: Self-pay | Admitting: Orthopaedic Surgery

## 2024-07-07 DIAGNOSIS — M25551 Pain in right hip: Secondary | ICD-10-CM

## 2024-07-07 NOTE — Progress Notes (Signed)
 The patient is a 73 year old female well-known to us .  We have not seen her in a while.  She has a remote history of a right knee replaced several years ago.  She does ambulate with a rolling walker.  She has seen her cardiologist a few weeks ago who recommended balance therapy.  She does tend to have a lot of falls.  She even fractured her right shoulder last year and so my partner Dr. Addie.  She points to the lateral aspect of her right hip as a source of her pain.  There is no pain in the groin.  On examination her right hip moves smoothly and fluidly with no blocks or rotation and no pain in the groin at all.  She does have significant pain to palpation of the right hip trochanteric area.  Of note she does have chronic kidney disease and sees nephrology for this.  She cannot take anti-inflammatories.  An AP pelvis lateral right hip shows no acute findings.  The hip joint space is well-maintained.  She would definitely benefit from outpatient physical therapy with any modalities per the therapist discretion that can help decrease her right trochanteric hip pain and bursitis with tendinitis.  They also really need to work on her balance coordination given her multiple falls.  We will then see her back in 6 weeks to see how she doing overall.  We can consider a steroid injection over the trochanteric area and then if she is not making improvements.  She agrees with this treatment plan.

## 2024-07-08 ENCOUNTER — Telehealth: Payer: Self-pay | Admitting: Orthopaedic Surgery

## 2024-07-08 ENCOUNTER — Other Ambulatory Visit: Payer: Self-pay

## 2024-07-08 DIAGNOSIS — M25551 Pain in right hip: Secondary | ICD-10-CM

## 2024-07-08 NOTE — Telephone Encounter (Signed)
 Pt called stating she had an appt yesterday and changed her mind about hip injection. Pt states she was unable to sleep and asking is it anyway possible to get one. Vernetta nor Clark has any openings this week. Please call pt at [phone].

## 2024-07-09 ENCOUNTER — Ambulatory Visit: Admitting: Physician Assistant

## 2024-07-09 DIAGNOSIS — M7061 Trochanteric bursitis, right hip: Secondary | ICD-10-CM

## 2024-07-09 MED ORDER — METHYLPREDNISOLONE ACETATE 40 MG/ML IJ SUSP
40.0000 mg | INTRAMUSCULAR | Status: AC | PRN
  Administered 2024-07-09: 40 mg via INTRA_ARTICULAR

## 2024-07-09 MED ORDER — LIDOCAINE HCL 1 % IJ SOLN
5.0000 mL | INTRAMUSCULAR | Status: AC | PRN
  Administered 2024-07-09: 5 mL

## 2024-07-09 NOTE — Progress Notes (Signed)
 Office Visit Note   Patient: Tiffany [name]           Date of Birth: August 30, 1950           MRN: [id number] Visit Date: 07/09/2024              Requested by: Janey Santos, MD [address] [place],  [place] [address] PCP: Avva, Ravisankar, MD  Chief Complaint  Patient presents with  . Right Hip - Pain      HPI: Tiffany [name] is a pleasant 73 year old woman who saw Dr. Vernetta a couple days ago.  His exam was consistent with trochanteric bursitis of the right hip.  At that time she declined an injection in.  She is set to start physical therapy.  She has decided that she would like to go forward with an injection today  Assessment & Plan: Visit Diagnoses:  1. Trochanteric bursitis, right hip     Plan: Exam findings consistent with trochanteric bursitis right hip we will go forward with an injection has scheduled follow-up with Dr. Vernetta in about 6 weeks we will begin physical therapy shortly  Follow-Up Instructions: Has 6-week follow-up with Dr. Vernetta Beers Exam  Patient is alert, oriented, no adenopathy, well-dressed, normal affect, normal respiratory effort. Examination of her right hip she has no groin pain she has neurovascular intact she has tenderness over the right trochanteric bursa    Imaging: No results found. No images are attached to the encounter.  Labs: Lab Results  Component Value Date   HGBA1C 7.3 (H) 06/12/2023   HGBA1C 6.0 (H) 11/10/2013   HGBA1C (H) 10/22/2010    8.2 (NOTE)                                                                       According to the ADA Clinical Practice Recommendations for 2011, when HbA1c is used as a screening test:   >=6.5%   Diagnostic of Diabetes Mellitus           (if abnormal result  is confirmed)  5.7-6.4%   Increased risk of developing Diabetes Mellitus  References:Diagnosis and Classification of Diabetes Mellitus,Diabetes Care,2011,34(Suppl 1):S62-S69 and Standards of Medical Care in         Diabetes -  2011,Diabetes Care,2011,34  (Suppl 1):S11-S61.   ESRSEDRATE 55 (H) 07/11/2009   REPTSTATUS 11/12/2013 FINAL 11/10/2013   REPTSTATUS 11/10/2013 FINAL 11/10/2013   GRAMSTAIN  10/18/2019    The sputum specimen submitted contains 25 or more squamous epithelial cells per low power field and is unacceptable for culture due to oropharyngeal contamination. Please resubmit another specimen if clinically indicated.   CULT NO GROWTH Performed at [facility] 11/10/2013   LABORGA STAPHYLOCOCCUS AUREUS 07/11/2009     Lab Results  Component Value Date   ALBUMIN 4.7 02/02/2024   ALBUMIN 4.4 06/12/2023   ALBUMIN 3.5 09/01/2021    Lab Results  Component Value Date   MG 2.5 10/22/2010   No results found for: VD25OH  No results found for: PREALBUMIN    Latest Ref Rng & Units 02/02/2024    4:02 PM 09/01/2021    7:47 PM 11/27/2020    9:52 AM  CBC EXTENDED  WBC 4.0 - 10.5 K/uL 7.0  11.1  7.3   RBC 3.87 - 5.11 Mil/uL 4.19  3.40  3.78   Hemoglobin 12.0 - 15.0 g/dL 87.4  89.7  88.6   HCT 36.0 - 46.0 % 37.8  32.7  34.3   Platelets 150.0 - 400.0 K/uL 188.0  188  240   NEUT# 1.4 - 7.7 K/uL 5.0  9.0    Lymph# 0.7 - 4.0 K/uL 1.5  1.3       There is no height or weight on file to calculate BMI.  Orders:  No orders of the defined types were placed in this encounter.  No orders of the defined types were placed in this encounter.    Procedures: Large Joint Inj: R greater trochanter on 07/09/2024 10:30 AM Indications: pain and diagnostic evaluation Details: 22 G 1.5 in and 3.5 in needle, lateral approach  Arthrogram: No  Medications: 5 mL lidocaine  1 %; 40 mg methylPREDNISolone  acetate 40 MG/ML Outcome: tolerated well, no immediate complications Procedure, treatment alternatives, risks and benefits explained, specific risks discussed. Consent was given by the patient.     Clinical Data: No additional findings.  ROS:  All other systems negative, except as noted in the  HPI. Review of Systems  Objective: Vital Signs: There were no vitals taken for this visit.  Specialty Comments:  No specialty comments available.  PMFS History: Patient Active Problem List   Diagnosis Date Noted  . Trochanteric bursitis, right hip 07/09/2024  . Spina bifida occulta   . Pain in right shoulder 07/14/2023  . Humeral head fracture, right, closed, initial encounter 07/14/2023  . Seasonal allergies 09/07/2021  . Acute bacterial rhinosinusitis 08/24/2021  . Morbid obesity (HCC) 10/19/2020  . Coronary artery calcification seen on CT scan 12/22/2017  . Full incontinence of feces 09/09/2017  . Balance problem 09/09/2017  . Diarrhea 04/03/2016  . Spina bifida of lumbar region without hydrocephalus (HCC) 03/07/2016  . Mild obstructive sleep apnea 03/07/2016  . Iron deficiency anemia 03/07/2016  . Pica in adults 03/07/2016  . Spina bifida of lumbosacral region without hydrocephalus (HCC) 08/16/2015  . Nocturnal headaches 08/16/2015  . Essential hypertension 08/16/2015  . Osteoarthritis of right knee 10/11/2014  . Status post total right knee replacement 10/11/2014  . Primary snoring 07/05/2014  . Bronchiectasis without complication (HCC) 12/29/2013  . DM type 2 (diabetes mellitus, type 2) (HCC) 11/10/2013  . History of colonic polyps 03/07/2008  . ADENOMATOUS COLONIC POLYP 03/04/2008  . ESOPHAGEAL STRICTURE 03/04/2008  . GERD 03/04/2008  . IBS 03/04/2008  . Dysphagia, pharyngoesophageal phase 02/05/2008   Past Medical History:  Diagnosis Date  . Allergy    . Anemia    pernicious anemia- recieved iron infusions until 10-2015  . Anginal pain   . Anxiety   . Arthritis    foot drop  lt foot  / toes   wears brace     . Bronchiectasis (HCC)   . Cancer (HCC)    SKIN    . Cataract    REMOVED  . Chronic cough   . CKD (chronic kidney disease)   . Colon polyps   . Coronary artery calcification seen on CT scan 12/22/2017   CT in 2016  . Depression   . Diabetes  mellitus   . Dysphagia   . Dysrhythmia    RAPID HEART BEAT , CHEST TIGHTNESS  09/29/14     . Gastritis   . GERD (gastroesophageal reflux disease)   . Hiatal hernia   . History of echocardiogram  Echo 5/19: Mild focal basal septal hypertrophy, EF 60-65, normal wall motion, grade 1 diastolic dysfunction, mildly dilated ascending aorta (38 mm), normal RVSF, mild TR, PASP 23  . Hyperlipidemia   . Hypertension   . IBS (irritable bowel syndrome)   . Insomnia   . Kidney stones    KIDNEY DISEASE   STAGE lll    . Kidney stones   . Macular disorder    MACULAR DISEASE  RT [name]   . Meningitis   . Osteoporosis   . Pneumonia    BRONCHIECTASIS  . Shortness of breath dyspnea    WITH EXERTION  . Shoulder injury 07/09/2023   05-2023 right did physical therapy for it  . Sleep apnea    uses cpap  . Spina bifida occulta    born with it surgery 1994  . Ulcer     Family History  Problem Relation Age of Onset  . Alzheimer's disease Father   . Irritable bowel syndrome Father   . Ulcers Father   . Gout Father   . Cancer Mother        ?  . Diabetes Mother   . Hypertension Mother   . Nephrolithiasis Mother   . Colon cancer Mother   . Ovarian cancer Mother   . Coronary artery disease Other        grandparent  . Thyroid cancer Sister   . Nephrolithiasis Brother   . Colon cancer Maternal Grandmother   . Heart attack Maternal Grandmother 52  . Esophageal cancer Cousin   . Esophageal cancer Cousin   . Heart attack Maternal Grandfather 60  . Colon polyps Neg Hx   . Rectal cancer Neg Hx   . Stomach cancer Neg Hx     Past Surgical History:  Procedure Laterality Date  . ABDOMINAL HYSTERECTOMY    . BACK SURGERY    . CATARACT EXTRACTION  03/2012   bilateral  . COLONOSCOPY    . eardrum     left   ruptured and repaired  . ELBOW SURGERY     left  . fundoplication    . KNEE ARTHROSCOPY     rt knee   . LEFT HEART CATH AND CORONARY ANGIOGRAPHY N/A 12/25/2017   Procedure: LEFT HEART CATH AND  CORONARY ANGIOGRAPHY;  Surgeon: Mady Bruckner, MD;  Location: [facility];  Service: Cardiovascular;  Laterality: N/A;  . LITHOTRIPSY    . MENISCUS REPAIR    . POLYPECTOMY    . TONSILLECTOMY    . TOTAL KNEE ARTHROPLASTY Right 10/11/2014   Procedure: RIGHT TOTAL KNEE ARTHROPLASTY;  Surgeon: Bruckner CINDERELLA Poli, MD;  Location: [facility] OR;  Service: Orthopedics;  Laterality: Right;   Social History   Occupational History  . Occupation: Retired    Comment: LPN  Tobacco Use  . Smoking status: Never  . Smokeless tobacco: Never  Vaping Use  . Vaping status: Never Used  Substance and Sexual Activity  . Alcohol  use: Yes    Comment: OCC.  . Drug use: No  . Sexual activity: Not Currently

## 2024-07-27 NOTE — Therapy (Signed)
 OUTPATIENT PHYSICAL THERAPY LOWER EXTREMITY EVALUATION   Patient Name: Tiffany [name] MRN: [id number] DOB:Jan 29, 1951, 73 y.o., female Today's Date: 07/29/2024  END OF SESSION:  PT End of Session - 07/29/24 1102     Visit Number 1    Number of Visits 16    Date for Recertification  09/23/24    Authorization Type [employer] MEDICARE $20 COPAY    PT Start Time 1102    PT Stop Time 1151    PT Time Calculation (min) 49 min    Equipment Utilized During Treatment Other (comment)   rollator   Activity Tolerance Patient tolerated treatment well;Patient limited by pain    Behavior During Therapy Lability;WFL for tasks assessed/performed          Past Medical History:  Diagnosis Date   Allergy     Anemia    pernicious anemia- recieved iron infusions until 10-2015   Anginal pain    Anxiety    Arthritis    foot drop  lt foot  / toes   wears brace      Bronchiectasis (HCC)    Cancer (HCC)    SKIN     Cataract    REMOVED   Chronic cough    CKD (chronic kidney disease)    Colon polyps    Coronary artery calcification seen on CT scan 12/22/2017   CT in 2016   Depression    Diabetes mellitus    Dysphagia    Dysrhythmia    RAPID HEART BEAT , CHEST TIGHTNESS  09/29/14      Gastritis    GERD (gastroesophageal reflux disease)    Hiatal hernia    History of echocardiogram    Echo 5/19: Mild focal basal septal hypertrophy, EF 60-65, normal wall motion, grade 1 diastolic dysfunction, mildly dilated ascending aorta (38 mm), normal RVSF, mild TR, PASP 23   Hyperlipidemia    Hypertension    IBS (irritable bowel syndrome)    Insomnia    Kidney stones    KIDNEY DISEASE   STAGE lll     Kidney stones    Macular disorder    MACULAR DISEASE  RT EYE    Meningitis    Osteoporosis    Pneumonia    BRONCHIECTASIS   Shortness of breath dyspnea    WITH EXERTION   Shoulder injury 07/09/2023   05-2023 right did physical therapy for it   Sleep apnea    uses cpap   Spina bifida occulta     born with it surgery 1994   Ulcer    Past Surgical History:  Procedure Laterality Date   ABDOMINAL HYSTERECTOMY     BACK SURGERY     CATARACT EXTRACTION  03/2012   bilateral   COLONOSCOPY     eardrum     left   ruptured and repaired   ELBOW SURGERY     left   fundoplication     KNEE ARTHROSCOPY     rt knee    LEFT HEART CATH AND CORONARY ANGIOGRAPHY N/A 12/25/2017   Procedure: LEFT HEART CATH AND CORONARY ANGIOGRAPHY;  Surgeon: Mady Bruckner, MD;  Location: [facility];  Service: Cardiovascular;  Laterality: N/A;   LITHOTRIPSY     MENISCUS REPAIR     POLYPECTOMY     TONSILLECTOMY     TOTAL KNEE ARTHROPLASTY Right 10/11/2014   Procedure: RIGHT TOTAL KNEE ARTHROPLASTY;  Surgeon: Bruckner CINDERELLA Poli, MD;  Location: [facility] OR;  Service: Orthopedics;  Laterality: Right;  Patient Active Problem List   Diagnosis Date Noted   Trochanteric bursitis, right hip 07/09/2024   Spina bifida occulta    Pain in right shoulder 07/14/2023   Humeral head fracture, right, closed, initial encounter 07/14/2023   Seasonal allergies 09/07/2021   Acute bacterial rhinosinusitis 08/24/2021   Morbid obesity (HCC) 10/19/2020   Coronary artery calcification seen on CT scan 12/22/2017   Full incontinence of feces 09/09/2017   Balance problem 09/09/2017   Diarrhea 04/03/2016   Spina bifida of lumbar region without hydrocephalus (HCC) 03/07/2016   Mild obstructive sleep apnea 03/07/2016   Iron deficiency anemia 03/07/2016   Pica in adults 03/07/2016   Spina bifida of lumbosacral region without hydrocephalus (HCC) 08/16/2015   Nocturnal headaches 08/16/2015   Essential hypertension 08/16/2015   Osteoarthritis of right knee 10/11/2014   Status post total right knee replacement 10/11/2014   Primary snoring 07/05/2014   Bronchiectasis without complication (HCC) 12/29/2013   DM type 2 (diabetes mellitus, type 2) (HCC) 11/10/2013   History of colonic polyps 03/07/2008   ADENOMATOUS COLONIC POLYP  03/04/2008   ESOPHAGEAL STRICTURE 03/04/2008   GERD 03/04/2008   IBS 03/04/2008   Dysphagia, pharyngoesophageal phase 02/05/2008    PCP: Searcy Overcast, MD   REFERRING PROVIDER: Lonni Poli, MD  REFERRING DIAG: M25.551 (ICD-10-CM) - Pain in right hip  THERAPY DIAG:  Pain in right hip  Stiffness of right hip, not elsewhere classified  Muscle weakness (generalized)  Other abnormalities of gait and mobility  Unsteadiness on feet  Rationale for Evaluation and Treatment: Rehabilitation  ONSET DATE: beginning of 2025 when falls started   SUBJECTIVE:   SUBJECTIVE STATEMENT: After all those falls, my hip has started to hurt.   PERTINENT HISTORY: Patient reports that her hip started hurting following falls at the beginning of the year. Patient endorsed having been diagnosed with bursitis, arthritis, and osteoporosis. Patient has not had any surgeries or other injuries to the area, though patient has had a surgery for Spina Bifida in 1991, Rt TKA, and has fractured Rt shoulder before where she underwent PT. Patient reports that she uses a guard in bed in order to not fall out and has foot drop in Lt foot due to Spina Bifida.   See PMH or personal factors for in depth comorbidities   PAIN:  Are you having pain? Yes: NPRS scale: 7/10 at rest, 12-13/10 at worst  Pain location: Rt hip  Pain description: sharp, throbbing  Aggravating factors: driving, ADLs, stairs, transfers  Relieving factors: mobility (but not in the wrong way), tylenol  arthritis and intermittently Robaxin  and Hydrocodone , heat, ice, reclining    PRECAUTIONS: Fall and Other: osteoporosis    WEIGHT BEARING RESTRICTIONS: No  FALLS:  Has patient fallen in last 6 months? Yes. Number of falls 5-6 times; out of bed   LIVING ENVIRONMENT: Lives with: lives with their spouse Lives in: House/apartment Stairs: Yes: Internal: 15 steps; can reach both and External: 3 steps; bilateral but cannot reach  both Has following equipment at home: Single point cane, Walker - 4 wheeled, shower chair, and Grab bars  OCCUPATION: retired engineer, civil (consulting)   PLOF: doesn't typically ambulate with rollator, independent with all other tasks   PATIENT GOALS: to get rid of this pain   NEXT MD VISIT: August 23, 2024   OBJECTIVE:  Note: Objective measures were completed at Evaluation unless otherwise noted.  DIAGNOSTIC FINDINGS:  An AP pelvis and lateral right hip shows no acute findings.  The hip joint  space  is well-maintained on both hips.   PATIENT SURVEYS:  PSFS: THE PATIENT SPECIFIC FUNCTIONAL SCALE  Place score of 0-10 (0 = unable to perform activity and 10 = able to perform activity at the same level as before injury or problem)  Activity Date: 07/29/2024    Walking without a device  6    2. Moving  without pain  4    3. Lying in bed comfortably  5    4. Driving  7    5.      Total Score 5.5      Total Score = Sum of activity scores/number of activities  Minimally Detectable Change: 3 points (for single activity); 2 points (for average score)  [facility] (nd). The Patient Specific Functional Scale . Retrieved from [email or web address]   COGNITION: Overall cognitive status: Within functional limits for tasks assessed     SENSATION: Patient reports n/t down into toes    MUSCLE LENGTH: No overt deficits with hamstring length   POSTURE: rounded shoulders, forward head, and increased thoracic kyphosis  PALPATION: TTP on lateral Rt thigh and at greater trochanter of Rt hip   LOWER EXTREMITY ROM:  Passive ROM Right Eval 07/29/2024 Left Eval 07/29/2024  Hip flexion [facility]  Hip extension    Hip abduction    Hip adduction    Hip internal rotation [facility] WFL  Hip external rotation [facility]  Knee flexion    Knee extension    Ankle dorsiflexion    Ankle plantarflexion    Ankle inversion    Ankle eversion     (Blank rows  = not tested)  LOWER EXTREMITY MMT:  MMT Right Eval 07/29/2024 Left Eval 07/29/2024  Hip flexion (seated) 4/5 (though using hands on arm chair) 4/5 (though using hands on arm chair)  Hip extension Unable to test due to pain with bed mobility Unable to test due to pain with bed mobility  Hip abduction 2+/5 Unable to test due to pain with bed mobility  Hip adduction    Hip internal rotation    Hip external rotation    Knee flexion (seated) 4/5 4/5  Knee extension (seated) 4+/5 4+/5  Ankle dorsiflexion (seated) 3+/5 3+/5  Ankle plantarflexion    Ankle inversion    Ankle eversion     (Blank rows = not tested)  LOWER EXTREMITY SPECIAL TESTS:   Slump: positive on Rt , pain in hip   FUNCTIONAL TESTS:  5 times sit to stand: 31.56s   GAIT: Distance walked: not objectively measured  Assistive device utilized: Walker - 4 wheeled Level of assistance: supervision Comments: small bilat steps,                                                                                                                                 TREATMENT DATE:  07/29/2024 TherEx:  HEP handout provided with PT demonstrating each activity with appropriate form.   Self-Care:  POC, back pain/sciatica and how it can affect the hip, strength/foot drop and impact on ambulation   PATIENT EDUCATION:  Education details: HEP, POC, sciatica, strength Person educated: Patient Education method: Explanation, Demonstration, Verbal cues, and Handouts Education comprehension: verbalized understanding, returned demonstration, and needs further education  HOME EXERCISE PROGRAM: Access Code: [email or web address] URL: [email or web address] Date: 07/29/2024 Prepared by: Susannah Daring  Exercises - Mini Squat with Counter Support  - 1 x daily - 7 x weekly - 2 sets - 10 reps - Standing Hip Abduction with Counter Support  - 1 x daily - 7 x weekly - 2 sets - 10 reps - Supine Lower Trunk Rotation  - 1 x daily - 7 x weekly - 2  sets - 10 reps - 5sec hold  ASSESSMENT:  CLINICAL IMPRESSION: Patient is a 74 y.o. F who was seen today for physical therapy evaluation and treatment for Rt hip pain and imbalance presenting with strength deficits, functional mobility deficits, motor coordination deficits, postural deficits, imbalance, and high levels of pain. Patient is mainly limited by high levels of pain with most mobility that leads to emotional lability. Patient will benefit from skilled PT to address above noted deficits.   OBJECTIVE IMPAIRMENTS: Abnormal gait, decreased activity tolerance, decreased balance, decreased coordination, difficulty walking, decreased ROM, decreased strength, impaired sensation, improper body mechanics, postural dysfunction, obesity, and pain.   ACTIVITY LIMITATIONS: carrying, lifting, bending, sitting, standing, squatting, sleeping, stairs, transfers, bed mobility, dressing, and hygiene/grooming  PARTICIPATION LIMITATIONS: cleaning, laundry, driving, shopping, and community activity  PERSONAL FACTORS: Past/current experiences, Time since onset of injury/illness/exacerbation, and 3+ comorbidities: arthritis, anxiety, cataracts, depression, DM2, GERD, sleep apnea, HLD, HTN, osteoporosis, foot drop, bronchiectasis, CKD, dysrhythmia, hiatal hernia, IBS, insomnia, macular disorder, spina bifida occulta are also affecting patient's functional outcome.   REHAB POTENTIAL: Fair multiple comorbidities and imbalance   CLINICAL DECISION MAKING: Unstable/unpredictable  EVALUATION COMPLEXITY: High   GOALS: Goals reviewed with patient? Yes  SHORT TERM GOALS: Target date: 08/23/2024 Patient will show compliance with initial HEP. Baseline: Goal status: INITIAL  2.  Patient will report pain levels no greater than 7/10 in order to show improved overall quality of life. Baseline:  Goal status: INITIAL  3.  Patient will decrease 5xSTS to at least 20s in order to decrease risk of falls. Baseline:   Goal status: INITIAL    LONG TERM GOALS: Target date: 09/23/2024  Patient will be independent with final HEP in order to maintain and progress upon functional gains made within PT. Baseline:  Goal status: INITIAL  2.  Patient will report pain levels no greater than 4/10 in order to show improved overall quality of life. Baseline:  Goal status: INITIAL  3.  Patient will decrease 5xSTS to at least 15s in order to decrease risk of falls. Baseline:  Goal status: INITIAL  4.  Patient will improve PSFS to at least 7.5 in order to show a significant improvement in subjective disability rating. Baseline:  Goal status: INITIAL  5.  Patient will report ability to decrease use of AD while ambulating by 50%. Baseline:  Goal status: INITIAL     PLAN:  PT FREQUENCY: 1-2x/week  PT DURATION: 8 weeks  PLANNED INTERVENTIONS: 97164- PT Re-evaluation, 97750- Physical Performance Testing, 97110-Therapeutic exercises, 97530- Therapeutic activity, [id number]- Neuromuscular re-education, 97535- Self Care, [id number]- Manual therapy, [id number]- Gait training, [id number]- Orthotic Initial, [id number]- Orthotic/Prosthetic subsequent, [phone]- Canalith repositioning, [id number]- Aquatic Therapy, H9716- Electrical stimulation (unattended), [id number]- Electrical stimulation (manual), [id number]- Vasopneumatic device, [id number]- Ultrasound, [id number]- Traction (  mechanical), 02966- Ionotophoresis 4mg /ml Dexamethasone, 20560 (1-2 muscles), 20561 (3+ muscles)- Dry Needling, Patient/Family education, Balance training, Stair training, Taping, Joint mobilization, Spinal mobilization, Vestibular training, DME instructions, Cryotherapy, and Moist heat  PLAN FOR NEXT SESSION: review HEP, more in depth balance assessment, generalized LE strength and flexibility, manual (?)   Susannah Daring, PT, DPT 07/29/24 4:31 PM    Date of referral: 07/08/2024 Referring provider: Lonni Poli, MD Referring diagnosis? M25.551 (ICD-10-CM) - Pain in right  hip Treatment diagnosis? (if different than referring diagnosis) M25.551, M25.651, M62.81, R26.89, R26.81  What was this (referring dx) caused by? Fall and Ongoing Issue  Lysle of Condition: Chronic (continuous duration > 3 months)   Laterality: Rt  Current Functional Measure Score: Patient Specific Functional Scale 5.5  Objective measurements identify impairments when they are compared to normal values, the uninvolved extremity, and prior level of function.  [x]  Yes  []  No  Objective assessment of functional ability: Moderate functional limitations   Briefly describe symptoms: high levels of pain causing difficulty with ADLs and ambulation, decreased LE strength, imbalance leading to reliance on assistive walking devices, decreased functional mobility  How did symptoms start: symptoms started following falls   Average pain intensity:  Last 24 hours: 7/10  Past week: 7-10/10  How often does the pt experience symptoms? Constantly  How much have the symptoms interfered with usual daily activities? Extremely  How has condition changed since care began at this facility? NA - initial visit  In general, how is the patients overall health? Good   BACK PAIN (STarT Back Screening Tool) Has pain spread down the leg(s) at some time in the last 2 weeks? yes Has there been pain in the shoulder or neck at some time in the last 2 weeks? yes Has the pt only walked short distances because of back pain? no Has patient dressed more slowly because of back pain in the past 2 weeks? yes Does patient think it's not safe for a person with this condition to be physically active? no Does patient have worrying thoughts a lot of the time? yes Does patient feel back pain is terrible and will never get any better? no Has patient stopped enjoying things they usually enjoy? yes

## 2024-07-29 ENCOUNTER — Other Ambulatory Visit: Payer: Self-pay | Admitting: Orthopaedic Surgery

## 2024-07-29 ENCOUNTER — Ambulatory Visit

## 2024-07-29 ENCOUNTER — Telehealth: Payer: Self-pay | Admitting: Orthopaedic Surgery

## 2024-07-29 ENCOUNTER — Other Ambulatory Visit: Payer: Self-pay | Admitting: Internal Medicine

## 2024-07-29 DIAGNOSIS — M25551 Pain in right hip: Secondary | ICD-10-CM | POA: Diagnosis not present

## 2024-07-29 DIAGNOSIS — R2689 Other abnormalities of gait and mobility: Secondary | ICD-10-CM

## 2024-07-29 DIAGNOSIS — I10 Essential (primary) hypertension: Secondary | ICD-10-CM

## 2024-07-29 DIAGNOSIS — M6281 Muscle weakness (generalized): Secondary | ICD-10-CM | POA: Diagnosis not present

## 2024-07-29 DIAGNOSIS — R2681 Unsteadiness on feet: Secondary | ICD-10-CM

## 2024-07-29 DIAGNOSIS — M25651 Stiffness of right hip, not elsewhere classified: Secondary | ICD-10-CM | POA: Diagnosis not present

## 2024-07-29 MED ORDER — METHOCARBAMOL 500 MG PO TABS: 500.0000 mg | ORAL_TABLET | Freq: Four times a day (QID) | ORAL | 1 refills | Status: AC | PRN

## 2024-07-29 MED ORDER — HYDROCODONE-ACETAMINOPHEN 5-325 MG PO TABS: 1.0000 | ORAL_TABLET | Freq: Three times a day (TID) | ORAL | 0 refills | Status: AC | PRN

## 2024-07-29 NOTE — Telephone Encounter (Signed)
 Patient would like Robaxin  called in to [facility] on [address], hydrocodone  as well called in for her.

## 2024-08-03 ENCOUNTER — Ambulatory Visit: Payer: Self-pay | Admitting: Physical Therapy

## 2024-08-03 ENCOUNTER — Encounter: Payer: Self-pay | Admitting: Physical Therapy

## 2024-08-03 DIAGNOSIS — M25551 Pain in right hip: Secondary | ICD-10-CM

## 2024-08-03 DIAGNOSIS — R2689 Other abnormalities of gait and mobility: Secondary | ICD-10-CM

## 2024-08-03 DIAGNOSIS — R2681 Unsteadiness on feet: Secondary | ICD-10-CM

## 2024-08-03 DIAGNOSIS — M25651 Stiffness of right hip, not elsewhere classified: Secondary | ICD-10-CM

## 2024-08-03 DIAGNOSIS — M6281 Muscle weakness (generalized): Secondary | ICD-10-CM

## 2024-08-03 NOTE — Therapy (Signed)
 OUTPATIENT PHYSICAL THERAPY TREATMENT   Patient Name: Tiffany [name] MRN: [id number] DOB:[birth year], 73 y.o., female Today's Date: 08/03/2024  END OF SESSION:  PT End of Session - 08/03/24 1021     Visit Number 2    Number of Visits 16    Date for Recertification  09/23/24    Authorization Type [employer] MEDICARE $20 COPAY    Progress Note Due on Visit 10    PT Start Time 1015    PT Stop Time 1055    PT Time Calculation (min) 40 min    Equipment Utilized During Treatment Other (comment)   rollator   Activity Tolerance Patient tolerated treatment well;Patient limited by pain    Behavior During Therapy Lability;WFL for tasks assessed/performed           Past Medical History:  Diagnosis Date   Allergy     Anemia    pernicious anemia- recieved iron infusions until 10-2015   Anginal pain    Anxiety    Arthritis    foot drop  lt foot  / toes   wears brace      Bronchiectasis (HCC)    Cancer (HCC)    SKIN     Cataract    REMOVED   Chronic cough    CKD (chronic kidney disease)    Colon polyps    Coronary artery calcification seen on CT scan 12/22/2017   CT in 2016   Depression    Diabetes mellitus    Dysphagia    Dysrhythmia    RAPID HEART BEAT , CHEST TIGHTNESS  09/29/14      Gastritis    GERD (gastroesophageal reflux disease)    Hiatal hernia    History of echocardiogram    Echo 5/19: Mild focal basal septal hypertrophy, EF 60-65, normal wall motion, grade 1 diastolic dysfunction, mildly dilated ascending aorta (38 mm), normal RVSF, mild TR, PASP 23   Hyperlipidemia    Hypertension    IBS (irritable bowel syndrome)    Insomnia    Kidney stones    KIDNEY DISEASE   STAGE lll     Kidney stones    Macular disorder    MACULAR DISEASE  RT EYE    Meningitis    Osteoporosis    Pneumonia    BRONCHIECTASIS   Shortness of breath dyspnea    WITH EXERTION   Shoulder injury 07/09/2023   05-2023 right did physical therapy for it   Sleep apnea    uses cpap   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   Ulcer    Past Surgical History:  Procedure Laterality Date   ABDOMINAL HYSTERECTOMY     BACK SURGERY     CATARACT EXTRACTION  03/2012   bilateral   COLONOSCOPY     eardrum     left   ruptured and repaired   ELBOW SURGERY     left   fundoplication     KNEE ARTHROSCOPY     rt knee    LEFT HEART CATH AND CORONARY ANGIOGRAPHY N/A 12/25/2017   Procedure: LEFT HEART CATH AND CORONARY ANGIOGRAPHY;  Surgeon: Mady Bruckner, MD;  Location: [facility];  Service: Cardiovascular;  Laterality: N/A;   LITHOTRIPSY     MENISCUS REPAIR     POLYPECTOMY     TONSILLECTOMY     TOTAL KNEE ARTHROPLASTY Right 10/11/2014   Procedure: RIGHT TOTAL KNEE ARTHROPLASTY;  Surgeon: Bruckner CINDERELLA Poli, MD;  Location: [facility] OR;  Service: Orthopedics;  Laterality: Right;   Patient Active Problem List   Diagnosis Date Noted   Trochanteric bursitis, right hip 07/09/2024   Spina bifida occulta    Pain in right shoulder 07/14/2023   Humeral head fracture, right, closed, initial encounter 07/14/2023   Seasonal allergies 09/07/2021   Acute bacterial rhinosinusitis 08/24/2021   Morbid obesity (HCC) 10/19/2020   Coronary artery calcification seen on CT scan 12/22/2017   Full incontinence of feces 09/09/2017   Balance problem 09/09/2017   Diarrhea 04/03/2016   Spina bifida of lumbar region without hydrocephalus (HCC) 03/07/2016   Mild obstructive sleep apnea 03/07/2016   Iron deficiency anemia 03/07/2016   Pica in adults 03/07/2016   Spina bifida of lumbosacral region without hydrocephalus (HCC) 08/16/2015   Nocturnal headaches 08/16/2015   Essential hypertension 08/16/2015   Osteoarthritis of right knee 10/11/2014   Status post total right knee replacement 10/11/2014   Primary snoring 07/05/2014   Bronchiectasis without complication (HCC) 12/29/2013   DM type 2 (diabetes mellitus, type 2) (HCC) 11/10/2013   History of colonic polyps 03/07/2008    ADENOMATOUS COLONIC POLYP 03/04/2008   ESOPHAGEAL STRICTURE 03/04/2008   GERD 03/04/2008   IBS 03/04/2008   Dysphagia, pharyngoesophageal phase 02/05/2008    PCP: Searcy Overcast, MD   REFERRING PROVIDER: Lonni Poli, MD  REFERRING DIAG: M25.551 (ICD-10-CM) - Pain in right hip  THERAPY DIAG:  Pain in right hip  Stiffness of right hip, not elsewhere classified  Muscle weakness (generalized)  Other abnormalities of gait and mobility  Unsteadiness on feet  Rationale for Evaluation and Treatment: Rehabilitation  ONSET DATE: beginning of 2025 when falls started   SUBJECTIVE:   SUBJECTIVE STATEMENT: Not good, definitely not any better.  Feels like sometimes things are worse, and today is terrible.  Taking tylenol , muscle relaxers and occasional hydrocodone  at night if needed.  Doing exercises once a day at this time.  After all those falls, my hip has started to hurt.   PERTINENT HISTORY: Patient reports that her hip started hurting following falls at the beginning of the year. Patient endorsed having been diagnosed with bursitis, arthritis, and osteoporosis. Patient has not had any surgeries or other injuries to the area, though patient has had a surgery for Spina Bifida in 1991, Rt TKA, and has fractured Rt shoulder before where she underwent PT. Patient reports that she uses a guard in bed in order to not fall out and has foot drop in Lt foot due to Spina Bifida.   See PMH or personal factors for in depth comorbidities   PAIN:  Are you having pain? Yes: NPRS scale: currently 8/10 at rest, 12-13/10 at worst  Pain location: Rt hip  Pain description: sharp, throbbing  Aggravating factors: driving, ADLs, stairs, transfers  Relieving factors: mobility (but not in the wrong way), tylenol  arthritis and intermittently Robaxin  and Hydrocodone , heat, ice, reclining    PRECAUTIONS: Fall and Other: osteoporosis    WEIGHT BEARING RESTRICTIONS: No  FALLS:  Has  patient fallen in last 6 months? Yes. Number of falls 5-6 times; out of bed   LIVING ENVIRONMENT: Lives with: lives with their spouse Lives in: House/apartment Stairs: Yes: Internal: 15 steps; can reach both and External: 3 steps; bilateral but cannot reach both Has following equipment at home: Single point cane, Walker - 4 wheeled, shower chair, and Grab bars  OCCUPATION: retired engineer, civil (consulting)   PLOF: doesn't typically ambulate with rollator, independent with all other tasks   PATIENT GOALS: to get  rid of this pain     OBJECTIVE:  Note: Objective measures were completed at Evaluation unless otherwise noted.  DIAGNOSTIC FINDINGS:  An AP pelvis and lateral right hip shows no acute findings.  The hip joint  space is well-maintained on both hips.   PATIENT SURVEYS:  PSFS: THE PATIENT SPECIFIC FUNCTIONAL SCALE  Place score of 0-10 (0 = unable to perform activity and 10 = able to perform activity at the same level as before injury or problem)  Activity Date: 07/29/2024    Walking without a device  6    2. Moving  without pain  4    3. Lying in bed comfortably  5    4. Driving  7    5.      Total Score 5.5      Total Score = Sum of activity scores/number of activities  Minimally Detectable Change: 3 points (for single activity); 2 points (for average score)  [facility] (nd). The Patient Specific Functional Scale . Retrieved from [email or web address]   COGNITION: Overall cognitive status: Within functional limits for tasks assessed     SENSATION: Patient reports n/t down into toes    MUSCLE LENGTH: No overt deficits with hamstring length   POSTURE: rounded shoulders, forward head, and increased thoracic kyphosis  PALPATION: TTP on lateral Rt thigh and at greater trochanter of Rt hip   LOWER EXTREMITY ROM:  Passive ROM Right Eval 07/29/2024 Left Eval 07/29/2024  Hip flexion [facility]  Hip extension    Hip  abduction    Hip adduction    Hip internal rotation [facility] WFL  Hip external rotation [facility]  Knee flexion    Knee extension    Ankle dorsiflexion    Ankle plantarflexion    Ankle inversion    Ankle eversion     (Blank rows = not tested)  LOWER EXTREMITY MMT:  MMT Right Eval 07/29/2024 Left Eval 07/29/2024  Hip flexion (seated) 4/5 (though using hands on arm chair) 4/5 (though using hands on arm chair)  Hip extension Unable to test due to pain with bed mobility Unable to test due to pain with bed mobility  Hip abduction 2+/5 Unable to test due to pain with bed mobility  Hip adduction    Hip internal rotation    Hip external rotation    Knee flexion (seated) 4/5 4/5  Knee extension (seated) 4+/5 4+/5  Ankle dorsiflexion (seated) 3+/5 3+/5  Ankle plantarflexion    Ankle inversion    Ankle eversion     (Blank rows = not tested)  LOWER EXTREMITY SPECIAL TESTS:   Slump: positive on Rt , pain in hip   FUNCTIONAL TESTS:  5 times sit to stand: 31.56s   GAIT: Distance walked: not objectively measured  Assistive device utilized: Walker - 4 wheeled Level of assistance: supervision Comments: small bilat steps,  TREATMENT DATE:  08/03/24 TherAct Mini squats 2x10 at counter Standing hip abduction 2x10 bil at counter  TherEx Sitting trunk rotation 10 x 5 sec hold Seated single limb knee to chest 10 x 5 sec hold Seated forward flexion stretch 10 x 10 sec hold Seated Rt piriformis stretch 5x20 sec hold   07/29/2024 TherEx:  HEP handout provided with PT demonstrating each activity with appropriate form.   Self-Care:  POC, back pain/sciatica and how it can affect the hip, strength/foot drop and impact on ambulation   PATIENT EDUCATION:  Education details: HEP, POC, sciatica, strength Person educated: Patient Education method: Explanation,  Demonstration, Verbal cues, and Handouts Education comprehension: verbalized understanding, returned demonstration, and needs further education  HOME EXERCISE PROGRAM: Access Code: [email or web address] URL: [email or web address] Date: 08/03/2024 Prepared by: Corean Ku  Exercises - Mini Squat with Counter Support  - 1 x daily - 7 x weekly - 2 sets - 10 reps - Standing Hip Abduction with Counter Support  - 1 x daily - 7 x weekly - 2 sets - 10 reps - Supine Lower Trunk Rotation  - 1 x daily - 7 x weekly - 2 sets - 10 reps - 5sec hold - Seated Trunk Rotation Stretch  - 1 x daily - 7 x weekly - 1 sets - 10 reps - 5 sec hold - Seated Single Knee to Chest  - 1 x daily - 7 x weekly - 1 sets - 10 reps - 5 sec hold - Seated Flexion Stretch with Swiss Ball  - 1 x daily - 7 x weekly - 1 sets - 10 reps - 10 sec hold - Seated Piriformis Stretch (Mirrored)  - 1 x daily - 7 x weekly - 1 sets - 5 reps - 15-20 sec hold  ASSESSMENT:  CLINICAL IMPRESSION: Pt tolerated session well today reporting pain was calming down at end of session.  She demonstrated good understanding of initial HEP but prefers to sit so added seated exercises today.  Continue skilled PT.    OBJECTIVE IMPAIRMENTS: Abnormal gait, decreased activity tolerance, decreased balance, decreased coordination, difficulty walking, decreased ROM, decreased strength, impaired sensation, improper body mechanics, postural dysfunction, obesity, and pain.   ACTIVITY LIMITATIONS: carrying, lifting, bending, sitting, standing, squatting, sleeping, stairs, transfers, bed mobility, dressing, and hygiene/grooming  PARTICIPATION LIMITATIONS: cleaning, laundry, driving, shopping, and community activity  PERSONAL FACTORS: Past/current experiences, Time since onset of injury/illness/exacerbation, and 3+ comorbidities: arthritis, anxiety, cataracts, depression, DM2, GERD, sleep apnea, HLD, HTN, osteoporosis, foot drop, bronchiectasis, CKD,  dysrhythmia, hiatal hernia, IBS, insomnia, macular disorder, spina bifida occulta are also affecting patient's functional outcome.   REHAB POTENTIAL: Fair multiple comorbidities and imbalance   CLINICAL DECISION MAKING: Unstable/unpredictable  EVALUATION COMPLEXITY: High   GOALS: Goals reviewed with patient? Yes  SHORT TERM GOALS: Target date: 08/23/2024 Patient will show compliance with initial HEP. Baseline: Goal status: INITIAL  2.  Patient will report pain levels no greater than 7/10 in order to show improved overall quality of life. Baseline:  Goal status: INITIAL  3.  Patient will decrease 5xSTS to at least 20s in order to decrease risk of falls. Baseline:  Goal status: INITIAL    LONG TERM GOALS: Target date: 09/23/2024  Patient will be independent with final HEP in order to maintain and progress upon functional gains made within PT. Baseline:  Goal status: INITIAL  2.  Patient will report pain levels no greater than 4/10 in order to show improved overall quality of life. Baseline:  Goal  status: INITIAL  3.  Patient will decrease 5xSTS to at least 15s in order to decrease risk of falls. Baseline:  Goal status: INITIAL  4.  Patient will improve PSFS to at least 7.5 in order to show a significant improvement in subjective disability rating. Baseline:  Goal status: INITIAL  5.  Patient will report ability to decrease use of AD while ambulating by 50%. Baseline:  Goal status: INITIAL     PLAN:  PT FREQUENCY: 1-2x/week  PT DURATION: 8 weeks  PLANNED INTERVENTIONS: [id number]- PT Re-evaluation, 97750- Physical Performance Testing, 97110-Therapeutic exercises, 97530- Therapeutic activity, [id number]- Neuromuscular re-education, 97535- Self Care, [id number]- Manual therapy, [id number]- Gait training, [phone]- Orthotic Initial, [phone]- Orthotic/Prosthetic subsequent, [phone]- Canalith repositioning, [phone]- Aquatic Therapy, [phone]- Electrical stimulation (unattended), [phone]- Electrical  stimulation (manual), [id number]- Vasopneumatic device, [id number]- Ultrasound, [id number]- Traction (mechanical), [id number]- Ionotophoresis 4mg /ml Dexamethasone, 79439 (1-2 muscles), 20561 (3+ muscles)- Dry Needling, Patient/Family education, Balance training, Stair training, Taping, Joint mobilization, Spinal mobilization, Vestibular training, DME instructions, Cryotherapy, and Moist heat  PLAN FOR NEXT SESSION: check on insurance auth,  review updated HEP, more in depth balance assessment when pt able to tolerate, generalized LE strength and flexibility, manual (?).  NEXT MD VISIT: August 23, 2024    Corean JULIANNA Ku, PT, DPT 08/03/24 11:00 AM    Date of referral: 07/08/2024 Referring provider: Lonni Poli, MD Referring diagnosis? M25.551 (ICD-10-CM) - Pain in right hip Treatment diagnosis? (if different than referring diagnosis) M25.551, M25.651, M62.81, R26.89, R26.81  What was this (referring dx) caused by? Fall and Ongoing Issue  Lysle of Condition: Chronic (continuous duration > 3 months)   Laterality: Rt  Current Functional Measure Score: Patient Specific Functional Scale 5.5  Objective measurements identify impairments when they are compared to normal values, the uninvolved extremity, and prior level of function.  [x]  Yes  []  No  Objective assessment of functional ability: Moderate functional limitations   Briefly describe symptoms: high levels of pain causing difficulty with ADLs and ambulation, decreased LE strength, imbalance leading to reliance on assistive walking devices, decreased functional mobility  How did symptoms start: symptoms started following falls   Average pain intensity:  Last 24 hours: 7/10  Past week: 7-10/10  How often does the pt experience symptoms? Constantly  How much have the symptoms interfered with usual daily activities? Extremely  How has condition changed since care began at this facility? NA - initial visit  In general, how is the  patients overall health? Good   BACK PAIN (STarT Back Screening Tool) Has pain spread down the leg(s) at some time in the last 2 weeks? yes Has there been pain in the shoulder or neck at some time in the last 2 weeks? yes Has the pt only walked short distances because of back pain? no Has patient dressed more slowly because of back pain in the past 2 weeks? yes Does patient think it's not safe for a person with this condition to be physically active? no Does patient have worrying thoughts a lot of the time? yes Does patient feel back pain is terrible and will never get any better? no Has patient stopped enjoying things they usually enjoy? yes

## 2024-08-03 NOTE — Telephone Encounter (Signed)
 Hydralazine  50mg  refill Dx-HTN Last sent 06/16/24-30 day supply as she needed an appt Last OV-06/25/24 with Dr. Okey and f/u in 1 year per OV

## 2024-08-09 NOTE — Therapy (Signed)
 OUTPATIENT PHYSICAL THERAPY TREATMENT   Patient Name: Tiffany [name] MRN: [id number] DOB:02-05-1951, 73 y.o., female Today's Date: 08/10/2024  END OF SESSION:  PT End of Session - 08/10/24 1101     Visit Number 3    Number of Visits 16    Date for Recertification  09/23/24    Authorization Type [employer] MEDICARE $20 COPAY    Authorization Time Period 16 VISITS 12/4-2/12/26    Authorization - Visit Number 3    Authorization - Number of Visits 16    Progress Note Due on Visit 10    PT Start Time 1103    PT Stop Time 1144    PT Time Calculation (min) 41 min    Equipment Utilized During Treatment Other (comment)   rollator   Activity Tolerance Patient tolerated treatment well;Patient limited by pain    Behavior During Therapy Lability;WFL for tasks assessed/performed            Past Medical History:  Diagnosis Date   Allergy     Anemia    pernicious anemia- recieved iron infusions until 10-2015   Anginal pain    Anxiety    Arthritis    foot drop  lt foot  / toes   wears brace      Bronchiectasis (HCC)    Cancer (HCC)    SKIN     Cataract    REMOVED   Chronic cough    CKD (chronic kidney disease)    Colon polyps    Coronary artery calcification seen on CT scan 12/22/2017   CT in 2016   Depression    Diabetes mellitus    Dysphagia    Dysrhythmia    RAPID HEART BEAT , CHEST TIGHTNESS  09/29/14      Gastritis    GERD (gastroesophageal reflux disease)    Hiatal hernia    History of echocardiogram    Echo 5/19: Mild focal basal septal hypertrophy, EF 60-65, normal wall motion, grade 1 diastolic dysfunction, mildly dilated ascending aorta (38 mm), normal RVSF, mild TR, PASP 23   Hyperlipidemia    Hypertension    IBS (irritable bowel syndrome)    Insomnia    Kidney stones    KIDNEY DISEASE   STAGE lll     Kidney stones    Macular disorder    MACULAR DISEASE  RT EYE    Meningitis    Osteoporosis    Pneumonia    BRONCHIECTASIS   Shortness of breath dyspnea     WITH EXERTION   Shoulder injury 07/09/2023   05-2023 right did physical therapy for it   Sleep apnea    uses cpap   Spina bifida occulta    born with it surgery 1994   Ulcer    Past Surgical History:  Procedure Laterality Date   ABDOMINAL HYSTERECTOMY     BACK SURGERY     CATARACT EXTRACTION  03/2012   bilateral   COLONOSCOPY     eardrum     left   ruptured and repaired   ELBOW SURGERY     left   fundoplication     KNEE ARTHROSCOPY     rt knee    LEFT HEART CATH AND CORONARY ANGIOGRAPHY N/A 12/25/2017   Procedure: LEFT HEART CATH AND CORONARY ANGIOGRAPHY;  Surgeon: Mady Bruckner, MD;  Location: [facility];  Service: Cardiovascular;  Laterality: N/A;   LITHOTRIPSY     MENISCUS REPAIR     POLYPECTOMY  TONSILLECTOMY     TOTAL KNEE ARTHROPLASTY Right 10/11/2014   Procedure: RIGHT TOTAL KNEE ARTHROPLASTY;  Surgeon: Lonni CINDERELLA Poli, MD;  Location: [facility] OR;  Service: Orthopedics;  Laterality: Right;   Patient Active Problem List   Diagnosis Date Noted   Trochanteric bursitis, right hip 07/09/2024   Spina bifida occulta    Pain in right shoulder 07/14/2023   Humeral head fracture, right, closed, initial encounter 07/14/2023   Seasonal allergies 09/07/2021   Acute bacterial rhinosinusitis 08/24/2021   Morbid obesity (HCC) 10/19/2020   Coronary artery calcification seen on CT scan 12/22/2017   Full incontinence of feces 09/09/2017   Balance problem 09/09/2017   Diarrhea 04/03/2016   Spina bifida of lumbar region without hydrocephalus (HCC) 03/07/2016   Mild obstructive sleep apnea 03/07/2016   Iron deficiency anemia 03/07/2016   Pica in adults 03/07/2016   Spina bifida of lumbosacral region without hydrocephalus (HCC) 08/16/2015   Nocturnal headaches 08/16/2015   Essential hypertension 08/16/2015   Osteoarthritis of right knee 10/11/2014   Status post total right knee replacement 10/11/2014   Primary snoring 07/05/2014   Bronchiectasis without  complication (HCC) 12/29/2013   DM type 2 (diabetes mellitus, type 2) (HCC) 11/10/2013   History of colonic polyps 03/07/2008   ADENOMATOUS COLONIC POLYP 03/04/2008   ESOPHAGEAL STRICTURE 03/04/2008   GERD 03/04/2008   IBS 03/04/2008   Dysphagia, pharyngoesophageal phase 02/05/2008    PCP: Searcy Overcast, MD   REFERRING PROVIDER: Lonni Poli, MD  REFERRING DIAG: M25.551 (ICD-10-CM) - Pain in right hip  THERAPY DIAG:  Pain in right hip  Stiffness of right hip, not elsewhere classified  Muscle weakness (generalized)  Other abnormalities of gait and mobility  Unsteadiness on feet  Rationale for Evaluation and Treatment: Rehabilitation  ONSET DATE: beginning of 2025 when falls started   SUBJECTIVE:   SUBJECTIVE STATEMENT: Patient reports that she has been able to stop taking pain medication other than tylenol  arthritis as needed.   PERTINENT HISTORY: Patient reports that her hip started hurting following falls at the beginning of the year. Patient endorsed having been diagnosed with bursitis, arthritis, and osteoporosis. Patient has not had any surgeries or other injuries to the area, though patient has had a surgery for Spina Bifida in 1991, Rt TKA, and has fractured Rt shoulder before where she underwent PT. Patient reports that she uses a guard in bed in order to not fall out and has foot drop in Lt foot due to Spina Bifida.   See PMH or personal factors for in depth comorbidities   PAIN:  Are you having pain? Yes: NPRS scale: 6/10 soreness, not pain Pain location: Rt hip  Pain description: sharp, throbbing  Aggravating factors: driving, ADLs, stairs, transfers  Relieving factors: mobility (but not in the wrong way), tylenol  arthritis and intermittently Robaxin  and Hydrocodone , heat, ice, reclining    PRECAUTIONS: Fall and Other: osteoporosis    WEIGHT BEARING RESTRICTIONS: No  FALLS:  Has patient fallen in last 6 months? Yes. Number of falls 5-6  times; out of bed   LIVING ENVIRONMENT: Lives with: lives with their spouse Lives in: House/apartment Stairs: Yes: Internal: 15 steps; can reach both and External: 3 steps; bilateral but cannot reach both Has following equipment at home: Single point cane, Walker - 4 wheeled, shower chair, and Grab bars  OCCUPATION: retired engineer, civil (consulting)   PLOF: doesn't typically ambulate with rollator, independent with all other tasks   PATIENT GOALS: to get rid of this pain  OBJECTIVE:  Note: Objective measures were completed at Evaluation unless otherwise noted.  DIAGNOSTIC FINDINGS:  An AP pelvis and lateral right hip shows no acute findings.  The hip joint  space is well-maintained on both hips.   PATIENT SURVEYS:  PSFS: THE PATIENT SPECIFIC FUNCTIONAL SCALE  Place score of 0-10 (0 = unable to perform activity and 10 = able to perform activity at the same level as before injury or problem)  Activity Date: 07/29/2024    Walking without a device  6    2. Moving  without pain  4    3. Lying in bed comfortably  5    4. Driving  7    5.      Total Score 5.5      Total Score = Sum of activity scores/number of activities  Minimally Detectable Change: 3 points (for single activity); 2 points (for average score)  [facility] (nd). The Patient Specific Functional Scale . Retrieved from [email or web address]   COGNITION: Overall cognitive status: Within functional limits for tasks assessed     SENSATION: Patient reports n/t down into toes    MUSCLE LENGTH: No overt deficits with hamstring length   POSTURE: rounded shoulders, forward head, and increased thoracic kyphosis  PALPATION: TTP on lateral Rt thigh and at greater trochanter of Rt hip   LOWER EXTREMITY ROM:  Passive ROM Right Eval 07/29/2024 Left Eval 07/29/2024  Hip flexion [facility]  Hip extension    Hip abduction    Hip adduction    Hip internal rotation [facility]  WFL  Hip external rotation [facility]  Knee flexion    Knee extension    Ankle dorsiflexion    Ankle plantarflexion    Ankle inversion    Ankle eversion     (Blank rows = not tested)  LOWER EXTREMITY MMT:  MMT Right Eval 07/29/2024 Left Eval 07/29/2024  Hip flexion (seated) 4/5 (though using hands on arm chair) 4/5 (though using hands on arm chair)  Hip extension Unable to test due to pain with bed mobility Unable to test due to pain with bed mobility  Hip abduction 2+/5 Unable to test due to pain with bed mobility  Hip adduction    Hip internal rotation    Hip external rotation    Knee flexion (seated) 4/5 4/5  Knee extension (seated) 4+/5 4+/5  Ankle dorsiflexion (seated) 3+/5 3+/5  Ankle plantarflexion    Ankle inversion    Ankle eversion     (Blank rows = not tested)  LOWER EXTREMITY SPECIAL TESTS:   Slump: positive on Rt , pain in hip   FUNCTIONAL TESTS:  5 times sit to stand: 31.56s   GAIT: Distance walked: not objectively measured  Assistive device utilized: Walker - 4 wheeled Level of assistance: supervision Comments: small bilat steps,  TREATMENT DATE:  08/10/2024 TherAct:  Nustep with bilat UE and LE level 3 for 8 minutes   TherEx:  Seated single knee to chest 1x5 with 5s hold each side  Seated piriformis stretch 2x30s each side  Seated fwd flexion with green theraball 2x30s  Seated adductor squeeze with yellow ball between knees 1x10 with 3s holds  Seated reverse clamshells with yoga block between knees 2x10 each side  Seated marches 2x20 with UE support for support  Seated clamshells with red TB around knees 1x15, 1x20    08/03/24 TherAct Mini squats 2x10 at counter Standing hip abduction 2x10 bil at counter  TherEx Sitting trunk rotation 10 x 5 sec hold Seated single limb knee to chest 10 x 5 sec hold Seated forward  flexion stretch 10 x 10 sec hold Seated Rt piriformis stretch 5x20 sec hold   07/29/2024 TherEx:  HEP handout provided with PT demonstrating each activity with appropriate form.   Self-Care:  POC, back pain/sciatica and how it can affect the hip, strength/foot drop and impact on ambulation   PATIENT EDUCATION:  Education details: HEP, POC, sciatica, strength Person educated: Patient Education method: Explanation, Demonstration, Verbal cues, and Handouts Education comprehension: verbalized understanding, returned demonstration, and needs further education  HOME EXERCISE PROGRAM: Access Code: [email or web address] URL: [email or web address] Date: 08/03/2024 Prepared by: Corean Ku  Exercises - Mini Squat with Counter Support  - 1 x daily - 7 x weekly - 2 sets - 10 reps - Standing Hip Abduction with Counter Support  - 1 x daily - 7 x weekly - 2 sets - 10 reps - Supine Lower Trunk Rotation  - 1 x daily - 7 x weekly - 2 sets - 10 reps - 5sec hold - Seated Trunk Rotation Stretch  - 1 x daily - 7 x weekly - 1 sets - 10 reps - 5 sec hold - Seated Single Knee to Chest  - 1 x daily - 7 x weekly - 1 sets - 10 reps - 5 sec hold - Seated Flexion Stretch with Swiss Ball  - 1 x daily - 7 x weekly - 1 sets - 10 reps - 10 sec hold - Seated Piriformis Stretch (Mirrored)  - 1 x daily - 7 x weekly - 1 sets - 5 reps - 15-20 sec hold  ASSESSMENT:  CLINICAL IMPRESSION: Patient arrived to session noting no pain, just soreness from exercises and increased activity. Patient tolerated all activities this date and will benefit from increased weightbearing activities. Patient will continue to benefit from skilled PT.  OBJECTIVE IMPAIRMENTS: Abnormal gait, decreased activity tolerance, decreased balance, decreased coordination, difficulty walking, decreased ROM, decreased strength, impaired sensation, improper body mechanics, postural dysfunction, obesity, and pain.   ACTIVITY LIMITATIONS:  carrying, lifting, bending, sitting, standing, squatting, sleeping, stairs, transfers, bed mobility, dressing, and hygiene/grooming  PARTICIPATION LIMITATIONS: cleaning, laundry, driving, shopping, and community activity  PERSONAL FACTORS: Past/current experiences, Time since onset of injury/illness/exacerbation, and 3+ comorbidities: arthritis, anxiety, cataracts, depression, DM2, GERD, sleep apnea, HLD, HTN, osteoporosis, foot drop, bronchiectasis, CKD, dysrhythmia, hiatal hernia, IBS, insomnia, macular disorder, spina bifida occulta are also affecting patient's functional outcome.   REHAB POTENTIAL: Fair multiple comorbidities and imbalance   CLINICAL DECISION MAKING: Unstable/unpredictable  EVALUATION COMPLEXITY: High   GOALS: Goals reviewed with patient? Yes  SHORT TERM GOALS: Target date: 08/23/2024 Patient will show compliance with initial HEP. Baseline: Goal status: INITIAL  2.  Patient will report pain levels no greater than 7/10 in order to show improved overall  quality of life. Baseline:  Goal status: INITIAL  3.  Patient will decrease 5xSTS to at least 20s in order to decrease risk of falls. Baseline:  Goal status: INITIAL    LONG TERM GOALS: Target date: 09/23/2024  Patient will be independent with final HEP in order to maintain and progress upon functional gains made within PT. Baseline:  Goal status: INITIAL  2.  Patient will report pain levels no greater than 4/10 in order to show improved overall quality of life. Baseline:  Goal status: INITIAL  3.  Patient will decrease 5xSTS to at least 15s in order to decrease risk of falls. Baseline:  Goal status: INITIAL  4.  Patient will improve PSFS to at least 7.5 in order to show a significant improvement in subjective disability rating. Baseline:  Goal status: INITIAL  5.  Patient will report ability to decrease use of AD while ambulating by 50%. Baseline:  Goal status: INITIAL     PLAN:  PT  FREQUENCY: 1-2x/week  PT DURATION: 8 weeks  PLANNED INTERVENTIONS: [id number]- PT Re-evaluation, 97750- Physical Performance Testing, 97110-Therapeutic exercises, 97530- Therapeutic activity, [id number]- Neuromuscular re-education, 97535- Self Care, 02859- Manual therapy, [id number]- Gait training, [phone]- Orthotic Initial, [phone]- Orthotic/Prosthetic subsequent, [phone]- Canalith repositioning, [phone]- Aquatic Therapy, [phone]- Electrical stimulation (unattended), [phone]- Electrical stimulation (manual), [id number]- Vasopneumatic device, [id number]- Ultrasound, [id number]- Traction (mechanical), [id number]- Ionotophoresis 4mg /ml Dexamethasone, 79439 (1-2 muscles), 20561 (3+ muscles)- Dry Needling, Patient/Family education, Balance training, Stair training, Taping, Joint mobilization, Spinal mobilization, Vestibular training, DME instructions, Cryotherapy, and Moist heat  PLAN FOR NEXT SESSION:  check on insurance auth, more in depth balance assessment when pt able to tolerate, generalized LE strength and flexibility, manual .  NEXT MD VISIT: August 23, 2024    Susannah Daring, PT, DPT 08/10/2024 11:48 AM    Date of referral: 07/08/2024 Referring provider: Lonni Poli, MD Referring diagnosis? M25.551 (ICD-10-CM) - Pain in right hip Treatment diagnosis? (if different than referring diagnosis) M25.551, M25.651, M62.81, R26.89, R26.81  What was this (referring dx) caused by? Fall and Ongoing Issue  Lysle of Condition: Chronic (continuous duration > 3 months)   Laterality: Rt  Current Functional Measure Score: Patient Specific Functional Scale 5.5  Objective measurements identify impairments when they are compared to normal values, the uninvolved extremity, and prior level of function.  [x]  Yes  []  No  Objective assessment of functional ability: Moderate functional limitations   Briefly describe symptoms: high levels of pain causing difficulty with ADLs and ambulation, decreased LE strength, imbalance leading to  reliance on assistive walking devices, decreased functional mobility  How did symptoms start: symptoms started following falls   Average pain intensity:  Last 24 hours: 7/10  Past week: 7-10/10  How often does the pt experience symptoms? Constantly  How much have the symptoms interfered with usual daily activities? Extremely  How has condition changed since care began at this facility? NA - initial visit  In general, how is the patients overall health? Good   BACK PAIN (STarT Back Screening Tool) Has pain spread down the leg(s) at some time in the last 2 weeks? yes Has there been pain in the shoulder or neck at some time in the last 2 weeks? yes Has the pt only walked short distances because of back pain? no Has patient dressed more slowly because of back pain in the past 2 weeks? yes Does patient think it's not safe for a person with this condition to be physically active? no Does patient have worrying thoughts  a lot of the time? yes Does patient feel back pain is terrible and will never get any better? no Has patient stopped enjoying things they usually enjoy? yes

## 2024-08-10 ENCOUNTER — Ambulatory Visit

## 2024-08-10 DIAGNOSIS — M6281 Muscle weakness (generalized): Secondary | ICD-10-CM

## 2024-08-10 DIAGNOSIS — M25651 Stiffness of right hip, not elsewhere classified: Secondary | ICD-10-CM

## 2024-08-10 DIAGNOSIS — M25551 Pain in right hip: Secondary | ICD-10-CM

## 2024-08-10 DIAGNOSIS — R2681 Unsteadiness on feet: Secondary | ICD-10-CM

## 2024-08-10 DIAGNOSIS — R2689 Other abnormalities of gait and mobility: Secondary | ICD-10-CM

## 2024-08-17 ENCOUNTER — Encounter

## 2024-08-23 ENCOUNTER — Encounter: Payer: Self-pay | Admitting: Orthopaedic Surgery

## 2024-08-23 ENCOUNTER — Ambulatory Visit (INDEPENDENT_AMBULATORY_CARE_PROVIDER_SITE_OTHER): Admitting: Orthopaedic Surgery

## 2024-08-23 DIAGNOSIS — M7061 Trochanteric bursitis, right hip: Secondary | ICD-10-CM

## 2024-08-23 DIAGNOSIS — M25551 Pain in right hip: Secondary | ICD-10-CM | POA: Diagnosis not present

## 2024-08-23 NOTE — Progress Notes (Signed)
 The patient is a very pleasant 73 year old who is being seen in follow-up today for right hip trochanteric bursitis.  She did see one of the PAs a few days after I saw her and came back in for steroid injection of her right hip trochanteric area.  She is now been to outpatient physical therapy and says she is doing wonderful.  She still in PT but she feels like she can cancel that.  She is mobilizing very well.  Her right hip is moving smoothly and fluidly with minimal pain or discomfort.  This point follow-up can be as needed since she is doing so well.  All question and concerns were addressed and answered.  If things worsen she knows to reach out to us  at any time.

## 2024-08-24 ENCOUNTER — Encounter

## 2024-08-31 ENCOUNTER — Encounter

## 2024-09-02 ENCOUNTER — Encounter

## 2024-09-02 ENCOUNTER — Other Ambulatory Visit (HOSPITAL_COMMUNITY): Payer: Self-pay | Admitting: Internal Medicine

## 2024-09-02 DIAGNOSIS — M810 Age-related osteoporosis without current pathological fracture: Secondary | ICD-10-CM | POA: Insufficient documentation

## 2024-09-02 NOTE — Progress Notes (Signed)
 Received Prolia referral. Patient has [employer] - will need to move forward with Jubbonti  Has been off of treatment  CMP from [facility] tab - calcium  wnl on 05/06/2024  Sherry Pennant, PharmD, MPH, BCPS, CPP Clinical Pharmacist

## 2024-09-03 ENCOUNTER — Telehealth (HOSPITAL_COMMUNITY): Payer: Self-pay | Admitting: Pharmacy Technician

## 2024-09-03 ENCOUNTER — Encounter (HOSPITAL_COMMUNITY): Payer: Self-pay | Admitting: Internal Medicine

## 2024-09-03 NOTE — Telephone Encounter (Signed)
 Auth Submission: {approved/denied:[id number]} Site of care: [facility] Payer: [employer] MEDICARE Medication & CPT/J Code(s) submitted: Jubbonti (denosumab-bbdz) [phone] Diagnosis Code: M81.0 Route of submission (phone, fax, portal): CMM KEY:  Phone # Fax # Auth type: Buy/Bill HB Units/visits requested: 60mg  x 2 doses, q 6 months Reference number: *** Approval from: *** to ***    Dagoberto Armour, CPhT [facility] Phone: [phone] 09/03/2024

## 2024-09-03 NOTE — Telephone Encounter (Signed)
 SABRA

## 2024-09-07 ENCOUNTER — Encounter

## 2024-09-09 ENCOUNTER — Encounter

## 2024-09-10 ENCOUNTER — Encounter (HOSPITAL_COMMUNITY): Payer: Self-pay | Admitting: Internal Medicine

## 2024-09-17 ENCOUNTER — Encounter (HOSPITAL_COMMUNITY)

## 2024-09-24 ENCOUNTER — Other Ambulatory Visit (HOSPITAL_BASED_OUTPATIENT_CLINIC_OR_DEPARTMENT_OTHER): Payer: Self-pay

## 2024-09-24 MED ORDER — AMLODIPINE BESYLATE 2.5 MG PO TABS: 2.5000 mg | ORAL_TABLET | Freq: Every day | ORAL | 3 refills | Status: AC

## 2024-09-25 ENCOUNTER — Other Ambulatory Visit (HOSPITAL_BASED_OUTPATIENT_CLINIC_OR_DEPARTMENT_OTHER): Payer: Self-pay

## 2024-09-25 MED ORDER — ATORVASTATIN CALCIUM 40 MG PO TABS: 40.0000 mg | ORAL_TABLET | Freq: Every day | ORAL | 2 refills | Status: AC

## 2024-09-25 MED ORDER — DULOXETINE HCL 60 MG PO CPEP: 60.0000 mg | ORAL_CAPSULE | Freq: Every evening | ORAL | 2 refills | Status: AC
